# Patient Record
Sex: Male | Born: 1995 | Race: Black or African American | Hispanic: No | Marital: Single | Smoking: Current every day smoker
Health system: Southern US, Community
[De-identification: ages and names within clinical notes are randomized; demographics above are authoritative.]

## PROBLEM LIST (undated history)

## (undated) ENCOUNTER — Ambulatory Visit (HOSPITAL_COMMUNITY): Disposition: A | Discharge status: Home / Self Care | Payer: Medicaid Other

## (undated) VITALS — BP 109/72 | HR 83 | Temp 98.1°F | Resp 18 | Ht 66.34 in | Wt 138.9 lb

## (undated) VITALS — BP 112/72 | HR 97 | Temp 98.3°F | Resp 16

## (undated) DIAGNOSIS — F603 Borderline personality disorder: Secondary | ICD-10-CM

## (undated) DIAGNOSIS — F329 Major depressive disorder, single episode, unspecified: Secondary | ICD-10-CM

## (undated) DIAGNOSIS — D649 Anemia, unspecified: Secondary | ICD-10-CM

## (undated) DIAGNOSIS — R51 Headache: Secondary | ICD-10-CM

## (undated) DIAGNOSIS — F209 Schizophrenia, unspecified: Secondary | ICD-10-CM

## (undated) DIAGNOSIS — F419 Anxiety disorder, unspecified: Secondary | ICD-10-CM

## (undated) DIAGNOSIS — L309 Dermatitis, unspecified: Secondary | ICD-10-CM

## (undated) DIAGNOSIS — H9325 Central auditory processing disorder: Secondary | ICD-10-CM

## (undated) DIAGNOSIS — F32A Depression, unspecified: Secondary | ICD-10-CM

## (undated) DIAGNOSIS — F913 Oppositional defiant disorder: Secondary | ICD-10-CM

## (undated) DIAGNOSIS — F902 Attention-deficit hyperactivity disorder, combined type: Secondary | ICD-10-CM

## (undated) DIAGNOSIS — Z7289 Other problems related to lifestyle: Secondary | ICD-10-CM

## (undated) DIAGNOSIS — F319 Bipolar disorder, unspecified: Secondary | ICD-10-CM

## (undated) DIAGNOSIS — F988 Other specified behavioral and emotional disorders with onset usually occurring in childhood and adolescence: Secondary | ICD-10-CM

## (undated) HISTORY — PX: BACK SURGERY: SHX140

## (undated) HISTORY — PX: FRACTURE SURGERY: SHX138

## (undated) HISTORY — PX: NO PAST SURGERIES: SHX2092

---
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
## 2017-01-03 ENCOUNTER — Encounter (HOSPITAL_COMMUNITY): Payer: Self-pay | Admitting: Emergency Medicine

## 2017-01-03 ENCOUNTER — Emergency Department (HOSPITAL_COMMUNITY)
Admission: EM | Admit: 2017-01-03 | Discharge: 2017-01-05 | Disposition: A | Discharge status: Home / Self Care | Payer: Medicaid Other | Attending: Emergency Medicine | Admitting: Emergency Medicine

## 2017-01-03 DIAGNOSIS — F3163 Bipolar disorder, current episode mixed, severe, without psychotic features: Secondary | ICD-10-CM | POA: Diagnosis present

## 2017-01-03 DIAGNOSIS — F909 Attention-deficit hyperactivity disorder, unspecified type: Secondary | ICD-10-CM | POA: Insufficient documentation

## 2017-01-03 DIAGNOSIS — F329 Major depressive disorder, single episode, unspecified: Secondary | ICD-10-CM | POA: Diagnosis present

## 2017-01-03 DIAGNOSIS — F17210 Nicotine dependence, cigarettes, uncomplicated: Secondary | ICD-10-CM | POA: Diagnosis not present

## 2017-01-03 DIAGNOSIS — Z79899 Other long term (current) drug therapy: Secondary | ICD-10-CM | POA: Insufficient documentation

## 2017-01-03 LAB — COMPREHENSIVE METABOLIC PANEL
ALT: 16 U/L — ABNORMAL LOW (ref 17–63)
AST: 26 U/L (ref 15–41)
Albumin: 4.6 g/dL (ref 3.5–5.0)
Alkaline Phosphatase: 92 U/L (ref 38–126)
Anion gap: 11 (ref 5–15)
BUN: 10 mg/dL (ref 6–20)
CO2: 27 mmol/L (ref 22–32)
Calcium: 9.8 mg/dL (ref 8.9–10.3)
Chloride: 103 mmol/L (ref 101–111)
Creatinine, Ser: 0.82 mg/dL (ref 0.61–1.24)
GFR calc Af Amer: >60 mL/min (ref >60)
GFR calc non Af Amer: >60 mL/min (ref >60)
Glucose, Bld: 80 mg/dL (ref 65–99)
Potassium: 3.2 mmol/L — ABNORMAL LOW (ref 3.5–5.1)
Sodium: 141 mmol/L (ref 135–145)
Total Bilirubin: 0.5 mg/dL (ref 0.3–1.2)
Total Protein: 8.8 g/dL — ABNORMAL HIGH (ref 6.5–8.1)

## 2017-01-03 LAB — ACETAMINOPHEN LEVEL: Acetaminophen (Tylenol), Serum: <10 ug/mL — ABNORMAL LOW (ref 10–30)

## 2017-01-03 LAB — ETHANOL: Alcohol, Ethyl (B): <5 mg/dL (ref <5)

## 2017-01-03 LAB — CBC
HCT: 50.5 % (ref 39.0–52.0)
Hemoglobin: 17.1 g/dL — ABNORMAL HIGH (ref 13.0–17.0)
MCH: 27.4 pg (ref 26.0–34.0)
MCHC: 33.9 g/dL (ref 30.0–36.0)
MCV: 81.1 fL (ref 78.0–100.0)
Platelets: 255 10*3/uL (ref 150–400)
RBC: 6.23 MIL/uL — ABNORMAL HIGH (ref 4.22–5.81)
RDW: 13.1 % (ref 11.5–15.5)
WBC: 4.8 10*3/uL (ref 4.0–10.5)

## 2017-01-03 LAB — SALICYLATE LEVEL: Salicylate Lvl: <7 mg/dL (ref 2.8–30.0)

## 2017-01-03 MED ORDER — QUETIAPINE FUMARATE 100 MG PO TABS: 100.0000 mg | ORAL_TABLET | Freq: Every day | ORAL | Status: DC

## 2017-01-03 MED ORDER — QUETIAPINE FUMARATE 25 MG PO TABS
25.0000 mg | ORAL_TABLET | Freq: Two times a day (BID) | ORAL | Status: DC
  Administered 2017-01-03: 25 mg via ORAL
  Filled 2017-01-03: qty 1

## 2017-01-03 MED ORDER — BUPROPION HCL 75 MG PO TABS
75.0000 mg | ORAL_TABLET | Freq: Every day | ORAL | Status: DC
  Administered 2017-01-03 – 2017-01-05 (×2): 75 mg via ORAL
  Filled 2017-01-03 (×3): qty 1

## 2017-01-03 MED ORDER — POTASSIUM CHLORIDE CRYS ER 20 MEQ PO TBCR
40.0000 meq | EXTENDED_RELEASE_TABLET | Freq: Once | ORAL | Status: DC
  Filled 2017-01-03: qty 2

## 2017-01-03 MED ORDER — FLUVOXAMINE MALEATE 50 MG PO TABS: 50.0000 mg | ORAL_TABLET | Freq: Every day | ORAL | Status: DC

## 2017-01-03 MED ORDER — CARBAMAZEPINE ER 200 MG PO TB12
200.0000 mg | ORAL_TABLET | Freq: Two times a day (BID) | ORAL | Status: DC
  Administered 2017-01-03 – 2017-01-05 (×3): 200 mg via ORAL
  Filled 2017-01-03 (×3): qty 1
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
## 2017-01-03 NOTE — ED Notes (Signed)
Hourly rounding reveals patient sleeping in room. No complaints, stable, in no acute distress. Q15 minute rounds and monitoring via Security Cameras to continue. 

## 2017-01-03 NOTE — ED Triage Notes (Signed)
Patient brought in [facility] under IVC papers that were taken out by mother that state: "respondent is suicidal according to mother. Threatened to kill himself and others. Has hallucinations claiming to put spells and curses on family members. Threatened to bleed his family and himself. Possibly taking illeagal drugs. According to mother, respondent is bipolar and is prescribed medication which he is currently not taking. Is a danger to himself and others."  When trying to patient, patient not answering or responding when asked any of the triage questions.  Patient cooperative with obtaining vitals and getting changed out into scrubs.

## 2017-01-03 NOTE — ED Notes (Signed)
[facility]: [facility] Expected date:  Expected time:  Means of arrival:  Comments: 

## 2017-01-03 NOTE — ED Notes (Signed)
Court Trublood is patient's ACT team member and can be reached at [phone]

## 2017-01-03 NOTE — ED Notes (Signed)
Bed: [facility] Expected date:  Expected time:  Means of arrival:  Comments: Hold for Triage 3

## 2017-01-03 NOTE — ED Notes (Signed)
Went into room he would not respond to me. He lifted his head up but would not let me get his vitals. Rn Jillyn Hidden was made aware of the refusal

## 2017-01-03 NOTE — ED Notes (Signed)
Report to include Situation, Background, Assessment, and Recommendations received from Diane RN. Patient warm and dry, in no acute distress. Patient refuses to answer questions regarding SI, HI, AVH and pain. Patient made aware of Q15 minute rounds and security cameras for their safety. Patient instructed to come to me with needs or concerns.

## 2017-01-03 NOTE — BH Assessment (Addendum)
[facility] Assessment Progress Note  This writer attempted to ascertain whether this pt has a care coordinator through his LME.  Neither [employer] nor the [facility] are able to find the pt in their system, and [facility] registration reports that the Medicaid ID number that we have for the pt ([id number]) is not active.  I then called [facility], pt's ACT Team provider.  They report that they received a call from Marisue Ivan at the [facility] [phone]) earlier today.  She informed them that she is pt's current care coordinator.  At 15:23 I called her, leaving a message on her voice mail.  Return call is pending as of this writing.  Doylene Canning, MA Triage Specialist [phone]   Addendum:  The best phone number for [name] (pt's mother/guardian) is [phone].  Letter of guardianship is found in pt's EPIC record.  Doylene Canning, MA Triage Specialist [phone]

## 2017-01-03 NOTE — ED Notes (Signed)
Hourly rounding reveals patient sleeping in room. No complaints, stable, in no acute distress. Q15 minute rounds and monitoring via [employer] to continue. Pt. Would not respond to question about his medication.

## 2017-01-03 NOTE — ED Notes (Signed)
Introduced self to patient. Pt refused to be oriented to unit expectations. Pt has been here before. Assessed pt for:  A) Anxiety &/or agitation: On admission to the [facility] pt when to bed and put the covers over his head. He would not respond to introductions or questions concerning his admission or comfort needs.   S) Safety: Safety maintained with q-15-minute checks and hourly rounds by staff.  A) ADLs: .dbtad  P) Pick-Up (room cleanliness): Pt's room clean and free of clutter.

## 2017-01-03 NOTE — ED Notes (Signed)
Pt did not want to take some of his psych medications because he is concerned that they will make him gain weight. He has a history of eating disorder. Nanine Means, DNP, re-evaluated his medications and ordered a combination that he agreed with. Pt took his medications. His affect is flat and his mood is depressed. He said that he has not been sleeping. He has been sitting in his chair in his room. He took a shower.

## 2017-01-03 NOTE — BH Assessment (Signed)
[facility] Assessment Progress Note  Case was staffed with Lord DNP who recommended a inpatient admission as appropriate bed placement is investigated.         

## 2017-01-03 NOTE — ED Provider Notes (Signed)
[facility]-EMERGENCY DEPT Provider Note   CSN: [id number] Arrival date & time: 01/03/17  0905     History   Chief Complaint Chief Complaint  Patient presents with  . IVC    HPI [name] is a 21 y.o. male.  21 year old male with history of ADHD as well as bipolar disorder here under IVC for suicidal ideations. Patient denies being suicidal. States that he has not been compliant with his psychiatric medications. Denies responding to internal stimuli. No recent use of alcohol or illicit drugs. Denies any somatic complaints at this time. No treatment use prior to arrival      Past Medical History:  Diagnosis Date  . ADD (attention deficit disorder)   . 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 Anemia   . Anxiety   . Bipolar disorder (HCC)   . Borderline personality disorder   . Central auditory processing disorder   . Deliberate self-cutting   . Depressed   . Eczema   . Headache(784.0)   . Medical history non-contributory   . Oppositional defiant disorder   . Schizophrenia [facility])     Patient Active Problem List   Diagnosis Date Noted  . Bipolar affective disorder, mixed, severe (HCC) 09/20/2016  . Cannabis use disorder, moderate, dependence (HCC) 06/05/2016  . Eating disorder Unspecified 05/22/2016  . Borderline personality disorder 05/16/2016  . MDD (major depressive disorder), recurrent episode, moderate (HCC) 05/16/2016  . Tobacco use disorder 05/15/2016    Past Surgical History:  Procedure Laterality Date  . BACK SURGERY    . FRACTURE SURGERY    . NO PAST SURGERIES    . POSTERIOR LUMBAR FUSION N/A 02/19/2015   Procedure: LATERAL L-2 CORPECTOMY;  Surgeon: Lisbeth Renshaw, MD;  Location: [facility] OR;  Service: Neurosurgery;  Laterality: N/A;  . POSTERIOR LUMBAR FUSION 4 LEVEL  02/19/2015   Procedure: Posterior T-12 - L-4 STABILIZATION OF POSTERIOR LUMBAR;  Surgeon: Lisbeth Renshaw, MD;  Location: [facility] OR;  Service:  Neurosurgery;;  . TIBIA IM NAIL INSERTION Left 02/20/2015   Procedure: INTRAMEDULLARY (IM) NAIL LEFT TIBIAL;  Surgeon: [name], MD;  Location: [facility] OR;  Service: Orthopedics;  Laterality: Left;       Home Medications    Prior to Admission medications   Medication Sig Start Date End Date Taking? Authorizing Provider  Brexpiprazole (REXULTI) 1 MG TABS Take 1 tablet (1 mg total) by mouth daily. 09/21/16   Charm Rings, NP  cloZAPine ([name]) 50 MG tablet Take 1 tablet (50 mg total) by mouth at bedtime. 09/20/16   Charm Rings, NP  fluvoxaMINE (LUVOX) 50 MG tablet Take 1 tablet (50 mg total) by mouth at bedtime. 09/20/16   Charm Rings, NP    Family History No family history on file.  Social History Social History  Substance Use Topics  . Smoking status: Current Every Day Smoker    Packs/day: 3.00    Types: Cigarettes  . Smokeless tobacco: Never Used  . Alcohol use 1.2 oz/week    2 Shots of liquor per week     Comment:  weekends some      Allergies   Prolixin [fluphenazine] and Risperdal [risperidone]   Review of Systems Review of Systems  All other systems reviewed and are negative.    Physical Exam Updated Vital Signs BP 140/71 (BP Location: Left Arm)   Pulse 63   Temp 98.4 F (36.9 C) (Oral)   Resp 15   SpO2 100%   Physical Exam  Constitutional: He is oriented to person, place, and time. He appears well-developed and well-nourished.  Non-toxic appearance. No distress.  HENT:  Head: Normocephalic and atraumatic.  Eyes: Pupils are equal, round, and reactive to light. Conjunctivae, EOM and lids are normal.  Neck: Normal range of motion. Neck supple. No tracheal deviation present. No thyroid mass present.  Cardiovascular: Normal rate, regular rhythm and normal heart sounds.  Exam reveals no gallop.   No murmur heard. Pulmonary/Chest: Effort normal and breath sounds normal. No stridor. No respiratory distress. He has no decreased breath sounds. He  has no wheezes. He has no rhonchi. He has no rales.  Abdominal: Soft. Normal appearance and bowel sounds are normal. He exhibits no distension. There is no tenderness. There is no rebound and no CVA tenderness.  Musculoskeletal: Normal range of motion. He exhibits no edema or tenderness.  Neurological: He is alert and oriented to person, place, and time. He has normal strength. No cranial nerve deficit or sensory deficit. GCS eye subscore is 4. GCS verbal subscore is 5. GCS motor subscore is 6.  Skin: Skin is warm and dry. No abrasion and no rash noted.  Psychiatric: His affect is blunt. His speech is delayed. He is withdrawn. He expresses inappropriate judgment. He expresses no homicidal and no suicidal ideation. He expresses no suicidal plans and no homicidal plans.  Nursing note and vitals reviewed.    ED Treatments / Results  Labs (all labs ordered are listed, but only abnormal results are displayed) Labs Reviewed  COMPREHENSIVE METABOLIC PANEL  ETHANOL  SALICYLATE LEVEL  ACETAMINOPHEN LEVEL  CBC  RAPID URINE DRUG SCREEN, HOSP PERFORMED    EKG  EKG Interpretation None       Radiology No results found.  Procedures Procedures (including critical care time)  Medications Ordered in ED Medications - No data to display   Initial Impression / Assessment and Plan / ED Course  I have reviewed the triage vital signs and the nursing notes.  Pertinent labs & imaging results that were available during my care of the patient were reviewed by me and considered in my medical decision making (see chart for details).     Labs are pending but patient is medically cleared for psychiatric disposition  Final Clinical Impressions(s) / ED Diagnoses   Final diagnoses:  None    New Prescriptions New Prescriptions   No medications on file     Lorre Nick, MD 01/03/17 [phone]

## 2017-01-04 DIAGNOSIS — G4700 Insomnia, unspecified: Secondary | ICD-10-CM

## 2017-01-04 DIAGNOSIS — F17210 Nicotine dependence, cigarettes, uncomplicated: Secondary | ICD-10-CM

## 2017-01-04 DIAGNOSIS — R45850 Homicidal ideations: Secondary | ICD-10-CM

## 2017-01-04 DIAGNOSIS — R45851 Suicidal ideations: Secondary | ICD-10-CM

## 2017-01-04 DIAGNOSIS — F1290 Cannabis use, unspecified, uncomplicated: Secondary | ICD-10-CM

## 2017-01-04 DIAGNOSIS — F3163 Bipolar disorder, current episode mixed, severe, without psychotic features: Secondary | ICD-10-CM

## 2017-01-04 MED ORDER — BREXPIPRAZOLE 1 MG PO TABS
1.0000 mg | ORAL_TABLET | Freq: Every day | ORAL | Status: DC
  Administered 2017-01-04 – 2017-01-05 (×2): 1 mg via ORAL
  Filled 2017-01-04 (×2): qty 1

## 2017-01-04 MED ORDER — TRAZODONE HCL 50 MG PO TABS: 50.0000 mg | ORAL_TABLET | Freq: Every evening | ORAL | Status: DC | PRN

## 2017-01-04 NOTE — ED Notes (Signed)
Pt declined to allow mHt to obtain VS

## 2017-01-04 NOTE — ED Notes (Signed)
Hourly rounding reveals patient sleeping in room. No complaints, stable, in no acute distress. Q15 minute rounds and monitoring via Security Cameras to continue. 

## 2017-01-04 NOTE — ED Notes (Addendum)
Pt agreed to take medication.  Pt able to converse with this Clinical research associate w/o difficulty. Sandwich given.

## 2017-01-04 NOTE — ED Notes (Signed)
Pt ate approx 45% of breakast

## 2017-01-04 NOTE — ED Notes (Signed)
Up to the bathroom 

## 2017-01-04 NOTE — ED Notes (Signed)
Report to include Situation, Background, Assessment, and Recommendations received from Janie RN. Patient alert and oriented, warm and dry, in no acute distress. Patient denies SI, HI, AVH and pain. Patient made aware of Q15 minute rounds and security cameras for their safety. Patient instructed to come to me with needs or concerns.  

## 2017-01-04 NOTE — ED Notes (Signed)
Up tot he bathroom to shower and change scrubs 

## 2017-01-04 NOTE — ED Notes (Signed)
Sitting on bed, declines to talk/answer questions.

## 2017-01-04 NOTE — ED Notes (Signed)
Sitting quieltly on the bed, Pt verbally declined to take his medications.."not willingly.Marland KitchenMarland Kitchen"

## 2017-01-04 NOTE — ED Notes (Signed)
On the phone 

## 2017-01-04 NOTE — ED Notes (Signed)
He refused his vitals Faith Rogue made aware

## 2017-01-04 NOTE — ED Notes (Signed)
Pt resting quietly watching tv, has eaten 100% of supper

## 2017-01-04 NOTE — ED Notes (Signed)
Pt. Approached by [facility] but will not respond to request for vital signs.

## 2017-01-04 NOTE — ED Notes (Signed)
PT knocked over his bedside table.  When asked why, pt remained non verbal with staff.  Pt sitting quietly on the bed.

## 2017-01-04 NOTE — ED Notes (Signed)
In room drawing

## 2017-01-04 NOTE — ED Notes (Signed)
Pt did not eat lunch 

## 2017-01-05 MED ORDER — BUPROPION HCL 75 MG PO TABS: 75.0000 mg | ORAL_TABLET | Freq: Every day | ORAL | 0 refills | Status: DC

## 2017-01-05 MED ORDER — CARBAMAZEPINE ER 200 MG PO TB12: 200.0000 mg | ORAL_TABLET | Freq: Two times a day (BID) | ORAL | 0 refills | Status: DC

## 2017-01-05 NOTE — ED Notes (Signed)
Hourly rounding reveals patient sleeping in room. No complaints, stable, in no acute distress. Q15 minute rounds and monitoring via Security Cameras to continue. 

## 2017-01-05 NOTE — Progress Notes (Signed)
APS worker Toya Smothers contacted CSW and took information about patient's case. CSW informed APS worker that patient is up for discharge and that patient's legal guardian is refusing to take him back and refusing options for discharge presented by ACT Team. APS worker agreed to contact patient's legal guardian and ACT Team to clarify discharge plans. APS worker reported that patient will either discharge back home with legal guardian or to one of the options presented by ACT Team. CSW awaiting return phone call with patient's discharge plans.  Celso Sickle, LCSWA [facility]  Clinical Social Worker [phone]

## 2017-01-05 NOTE — Progress Notes (Signed)
CSW contacted patient's legal guardian and informed of the options presented by ACT Team. Patient's legal guardian reported that they can do better than that and that they need to find her son placement. Patient's legal guardian continuously talked and CSW was not able to speak. Patient's legal guardian refused ACT Team placement options and reported that it was the ACT Team and hospital's responsibility to find placement for patient. CSW informed patient's legal guardian that APS would be contacted as a next step. Patient's legal guardian stated "do what you got to do" and ended call with CSW.  CSW provided update to patient's ACT Team.   CSW contacted [facility] and initiated APS report, awaiting return call from APS worker.   Celso Sickle, LCSWA [facility]  Clinical Social Worker [phone]

## 2017-01-05 NOTE — Consult Note (Signed)
[name] Face-to-Face Psychiatry Consult   Reason for Consult:  Threats to himself and others Referring Physician:  EDP Patient Identification: [name] MRN:  [id number] Principal Diagnosis: Bipolar affective disorder, mixed, severe (HCC) Diagnosis:   Patient Active Problem List   Diagnosis Date Noted  . Bipolar affective disorder, mixed, severe (HCC) [F31.63] 09/20/2016    Priority: High  . Cannabis use disorder, moderate, dependence (HCC) [F12.20] 06/05/2016  . Eating disorder Unspecified [F50.9] 05/22/2016  . Borderline personality disorder [F60.3] 05/16/2016  . Tobacco use disorder [F17.200] 05/15/2016    Total Time spent with patient: 30 minutes  Subjective:   [name] is a 21 y.o. male patient does not warrant admission  HPI:  21 yo male who presented to the ED under IVC by his mother for alleged threats to her and himself, noncompliant with medications.  He was started on medications and stabilized.  Continues to deny suicidal/homicidal ideations, hallucinations, or withdrawal symptoms.  He has been calm and cooperative.  On assessment, he was coloring designs and watching television.  His mother notified of his discharge and was very upset as always that he was not going to [facility] despite not even meeting criteria for regular inpatient.  Stable for discharge.  Past Psychiatric History: bipolar affective disorder, substance abuse  Risk to Self: None Risk to Others: None Prior Inpatient Therapy: Prior Inpatient Therapy: Yes Prior Therapy Dates: 2018 Prior Therapy Facilty/Provider(s): [facility] Reason for Treatment: MH issues Prior Outpatient Therapy: Prior Outpatient Therapy: Yes Prior Therapy Dates: 2018 Prior Therapy Facilty/Provider(s): [facility] Reason for Treatment: MH issues Does patient have an ACCT team?: Yes Does patient have Intensive In-House Services?  : No Does patient have [facility] services? : No Does patient have P4CC services?:  No  Past Medical History:  Past Medical History:  Diagnosis Date  . ADD (attention deficit disorder)   . 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 Anemia   . Anxiety   . Bipolar disorder (HCC)   . Borderline personality disorder   . Central auditory processing disorder   . Deliberate self-cutting   . Depressed   . Eczema   . Headache(784.0)   . Medical history non-contributory   . Oppositional defiant disorder   . Schizophrenia [facility])     Past Surgical History:  Procedure Laterality Date  . BACK SURGERY    . FRACTURE SURGERY    . NO PAST SURGERIES    . POSTERIOR LUMBAR FUSION N/A 02/19/2015   Procedure: LATERAL L-2 CORPECTOMY;  Surgeon: Lisbeth Renshaw, MD;  Location: [facility] OR;  Service: Neurosurgery;  Laterality: N/A;  . POSTERIOR LUMBAR FUSION 4 LEVEL  02/19/2015   Procedure: Posterior T-12 - L-4 STABILIZATION OF POSTERIOR LUMBAR;  Surgeon: Lisbeth Renshaw, MD;  Location: [facility] OR;  Service: Neurosurgery;;  . TIBIA IM NAIL INSERTION Left 02/20/2015   Procedure: INTRAMEDULLARY (IM) NAIL LEFT TIBIAL;  Surgeon: [name], MD;  Location: [facility] OR;  Service: Orthopedics;  Laterality: Left;   Family History: No family history on file. Family Psychiatric  History: mother-mental issues Social History:  History  Alcohol Use  . 1.2 oz/week  . 2 Shots of liquor per week    Comment:  weekends some      History  Drug Use  . Types: Marijuana    Comment: every other day    Social History   Social History  . Marital status: Single    Spouse name: N/A  . Number of children: N/A  .  Years of education: N/A   Social History Main Topics  . Smoking status: Current Every Day Smoker    Packs/day: 3.00    Types: Cigarettes  . Smokeless tobacco: Never Used  . Alcohol use 1.2 oz/week    2 Shots of liquor per week     Comment:  weekends some   . Drug use: Yes    Types: Marijuana     Comment: every other day  . Sexual activity: Yes    Birth  control/ protection: None     Comment: pt reluctant to answer questions and frequently stated " I dont know"   Other Topics Concern  . None   Social History Narrative   ** Merged History Encounter **       Additional Social History:    Allergies:   Allergies  Allergen Reactions  . Prolixin [Fluphenazine] Other (See Comments)    Hallucinations  . Risperdal [Risperidone] Other (See Comments)    Unknown    Labs: No results found for this or any previous visit (from the past 48 hour(s)).  Current Facility-Administered Medications  Medication Dose Route Frequency Provider Last Rate Last Dose  . Brexpiprazole TABS 1 mg  1 mg Oral Daily Lovella Hardie, MD   1 mg at 01/04/17 1415  . buPROPion [facility]) tablet 75 mg  75 mg Oral Daily Charm Rings, NP   75 mg at 01/03/17 1539  . carbamazepine (TEGRETOL XR) 12 hr tablet 200 mg  200 mg Oral BID Charm Rings, NP   200 mg at 01/04/17 2114  . potassium chloride SA (K-DUR,KLOR-CON) CR tablet 40 mEq  40 mEq Oral Once Lorre Nick, MD      . traZODone (DESYREL) tablet 50 mg  50 mg Oral QHS PRN Thedore Mins, MD       Current Outpatient Prescriptions  Medication Sig Dispense Refill  . Brexpiprazole (REXULTI) 1 MG TABS Take 1 tablet (1 mg total) by mouth daily. (Patient not taking: Reported on 01/03/2017) 30 tablet 0  . cloZAPine ([name]) 50 MG tablet Take 1 tablet (50 mg total) by mouth at bedtime. (Patient not taking: Reported on 01/03/2017) 30 tablet 0  . fluvoxaMINE (LUVOX) 50 MG tablet Take 1 tablet (50 mg total) by mouth at bedtime. (Patient not taking: Reported on 01/03/2017) 30 tablet 0    Musculoskeletal: Strength & Muscle Tone: within normal limits Gait & Station: normal Patient leans: N/A  Psychiatric Specialty Exam: Physical Exam  Constitutional: He is oriented to person, place, and time. He appears well-developed and well-nourished.  HENT:  Head: Normocephalic.  Neck: Normal range of motion.  Respiratory:  Effort normal.  Musculoskeletal: Normal range of motion.  Neurological: He is alert and oriented to person, place, and time.  Psychiatric: He has a normal mood and affect. His speech is normal and behavior is normal. Judgment and thought content normal. Cognition and memory are normal.    Review of Systems  Psychiatric/Behavioral: Positive for substance abuse.  All other systems reviewed and are negative.   Blood pressure (!) 110/59, pulse 64, temperature 98 F (36.7 C), temperature source Oral, resp. rate 18, SpO2 100 %.There is no height or weight on file to calculate BMI.  General Appearance: Casual  Eye Contact:  Good  Speech:  Normal Rate  Volume:  Normal  Mood:  Euthymic  Affect:  Blunt  Thought Process:  Coherent and Descriptions of Associations: Intact  Orientation:  Full (Time, Place, and Person)  Thought Content:  WDL and  Logical  Suicidal Thoughts:  No  Homicidal Thoughts:  No  Memory:  Immediate;   Good Recent;   Good Remote;   Good  Judgement:  Fair  Insight:  Fair  Psychomotor Activity:  Normal  Concentration:  Concentration: Good and Attention Span: Good  Recall:  Good  Fund of Knowledge:  Good  Language:  Good  Akathisia:  No  Handed:  Right  AIMS (if indicated):     Assets:  Leisure Time Physical Health Resilience Social Support  ADL's:  Intact  Cognition:  WNL  Sleep:        Treatment Plan Summary: Daily contact with patient to assess and evaluate symptoms and progress in treatment, Medication management and Plan bipolar affective disorder, mixed, mild: -Crisis stabilization -Medication management:  Started Wellbutrin 75 mg daily for depression, Tegretol 200 mg BID for mood stabilization, Brintellix 1 mg daily for depression, and Trazodone 50 mg at bedtime PRN for sleep -Individual counseling  Disposition: No evidence of imminent risk to self or others at present.    Nanine Means, NP 01/05/2017 10:21 AM  Patient seen face-to-face for  psychiatric evaluation, chart reviewed and case discussed with the physician extender and developed treatment plan. Reviewed the information documented and agree with the treatment plan. Thedore Mins, MD

## 2017-01-05 NOTE — Progress Notes (Signed)
CSW contacted by APS worker Toya Smothers and notified that patient's ACT Team was updated and has agreed to take patient to a friend's house or to the shelter. APS worker reported that she would follow up with ACT Team staff to learn where patient was discharged to, so they can continue to follow up with patient.   CSW spoke with patient regarding discharge plans. Patient inquired about his mother picking him up, CSW informed patient that patient's mother was contacted and that she reported that she was not able to pick up patient. Patient reported that he does not have a place to go and requested that CSW notify ACT Team staff.   CSW contacted patient's ACT Team [phone]) crisis line, awaiting return call.   CSW contacted by patient's ACT Team (Court Trublood), CSW informed patient's ACT Team that patient reported that he does not a have a friend's house to go. Patient's ACT Team inquired about patient having ID for shelter. Patient reported that he does not have an ID that it's at his mother's house. ACT Team requested that patient be asked to work on getting his ID from his mother's house and that ACT Team would work on getting a shelter placement. CSW asked patient to try to get ID from mother, patient reported that he has not had any luck getting in touch with his mother. CSW awaiting return call from patient's ACT Team regarding shelter placement for patient.   [name], LCSWA [facility]  Clinical Social Worker [phone]

## 2017-01-05 NOTE — Progress Notes (Signed)
CSW contacted by ACT Team staff member Court Trublood and informed that a shelter bed was found for patient in [place], [place]. CSW informed patient about shelter bed, patient agreed to go. ACT Team member reported that he would be at hospital to get patient within the next hour. Patient and patient's RN updated about transport.   Celso Sickle, LCSWA [facility]  Clinical Social Worker [phone]

## 2017-01-05 NOTE — Progress Notes (Signed)
CSW informed by patient's RN that patient requesting to speak with CSW. CSW spoke with patient, patient reported that he has a friend to stay with now. CSW will update patient's ACT Team member when they arrive to pick up patient.   Celso Sickle, LCSWA [facility]  Clinical Social Worker [phone]

## 2017-01-05 NOTE — [facility] Suicide Risk Assessment (Signed)
Suicide Risk Assessment  Discharge Assessment   [facility] Discharge Suicide Risk Assessment   Principal Problem: Bipolar affective disorder, mixed, severe [facility]) Discharge Diagnoses:  Patient Active Problem List   Diagnosis Date Noted  . Bipolar affective disorder, mixed, severe (HCC) [F31.63] 09/20/2016    Priority: High  . Cannabis use disorder, moderate, dependence (HCC) [F12.20] 06/05/2016  . Eating disorder Unspecified [F50.9] 05/22/2016  . Borderline personality disorder [F60.3] 05/16/2016  . Tobacco use disorder [F17.200] 05/15/2016    Total Time spent with patient: 45 minutes  Musculoskeletal: Strength & Muscle Tone: within normal limits Gait & Station: normal Patient leans: N/A  Psychiatric Specialty Exam: Physical Exam  Constitutional: He is oriented to person, place, and time. He appears well-developed and well-nourished.  HENT:  Head: Normocephalic.  Neck: Normal range of motion.  Respiratory: Effort normal.  Musculoskeletal: Normal range of motion.  Neurological: He is alert and oriented to person, place, and time.  Psychiatric: He has a normal mood and affect. His speech is normal and behavior is normal. Judgment and thought content normal. Cognition and memory are normal.    Review of Systems  Psychiatric/Behavioral: Positive for substance abuse.  All other systems reviewed and are negative.   Blood pressure (!) 110/59, pulse 64, temperature 98 F (36.7 C), temperature source Oral, resp. rate 18, SpO2 100 %.There is no height or weight on file to calculate BMI.  General Appearance: Casual  Eye Contact:  Good  Speech:  Normal Rate  Volume:  Normal  Mood:  Euthymic  Affect:  Blunt  Thought Process:  Coherent and Descriptions of Associations: Intact  Orientation:  Full (Time, Place, and Person)  Thought Content:  WDL and Logical  Suicidal Thoughts:  No  Homicidal Thoughts:  No  Memory:  Immediate;   Good Recent;   Good Remote;   Good  Judgement:  Fair   Insight:  Fair  Psychomotor Activity:  Normal  Concentration:  Concentration: Good and Attention Span: Good  Recall:  Good  Fund of Knowledge:  Good  Language:  Good  Akathisia:  No  Handed:  Right  AIMS (if indicated):     Assets:  Leisure Time Physical Health Resilience Social Support  ADL's:  Intact  Cognition:  WNL  Sleep:      Mental Status Per Nursing Assessment::   On Admission:   IVC'd by his mother for suicidal/homicidal threats  Demographic Factors:  Male and Adolescent or young adult  Loss Factors: NA  Historical Factors: NA  Risk Reduction Factors:   Sense of responsibility to family, Living with another person, especially a relative and Positive therapeutic relationship  Continued Clinical Symptoms:  None   Cognitive Features That Contribute To Risk:  None    Suicide Risk:  Minimal: No identifiable suicidal ideation.  Patients presenting with no risk factors but with morbid ruminations; may be classified as minimal risk based on the severity of the depressive symptoms    Plan Of Care/Follow-up recommendations:  Activity:  as tolerated Diet:  heart healthy diet  LORD, JAMISON, NP 01/05/2017, 2:12 PM

## 2017-01-05 NOTE — Progress Notes (Signed)
CSW contacted patient's legal guardian [name] and informed her that patient was psychiatrically cleared and ready for discharge. Patient's legal guardian reported that she is not taking patient back due to safety reasons. She reported that patient's ACT Team should be contacted to assist with patient's discharge and finding patient a place to live. She reported that patient should not be informed that she is not taking him back.  CSW contacted patient's [facility] ACT Team member Court Trublood [phone]), CSW connected with crisis line and awaiting return call.   Celso Sickle, LCSWA [facility]  Clinical Social Worker [phone]
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
## 2017-01-29 ENCOUNTER — Encounter (HOSPITAL_COMMUNITY): Payer: Self-pay

## 2017-01-29 ENCOUNTER — Emergency Department (HOSPITAL_COMMUNITY)
Admission: EM | Admit: 2017-01-29 | Discharge: 2017-01-29 | Discharge status: Against Medical Advice | Payer: Medicaid Other | Attending: Emergency Medicine | Admitting: Emergency Medicine

## 2017-01-29 DIAGNOSIS — R05 Cough: Secondary | ICD-10-CM | POA: Diagnosis present

## 2017-01-29 DIAGNOSIS — Z5321 Procedure and treatment not carried out due to patient leaving prior to being seen by health care provider: Secondary | ICD-10-CM | POA: Diagnosis not present

## 2017-01-29 NOTE — ED Notes (Signed)
Pt called for room x1 no answer  

## 2017-01-29 NOTE — ED Triage Notes (Signed)
Pt states that for the past three days has been having cough, sometimes productive and body aches. No fevers.

## 2017-02-08 ENCOUNTER — Encounter (HOSPITAL_COMMUNITY): Payer: Self-pay | Admitting: Emergency Medicine

## 2017-02-08 ENCOUNTER — Emergency Department (HOSPITAL_COMMUNITY)
Admission: EM | Admit: 2017-02-08 | Discharge: 2017-02-08 | Disposition: A | Discharge status: Home / Self Care | Payer: Medicaid Other | Attending: Emergency Medicine | Admitting: Emergency Medicine

## 2017-02-08 DIAGNOSIS — M791 Myalgia: Secondary | ICD-10-CM | POA: Insufficient documentation

## 2017-02-08 DIAGNOSIS — Z5321 Procedure and treatment not carried out due to patient leaving prior to being seen by health care provider: Secondary | ICD-10-CM | POA: Diagnosis not present

## 2017-02-08 NOTE — ED Notes (Signed)
Called for room X2 with no answer 

## 2017-02-08 NOTE — ED Triage Notes (Signed)
Pt c/o pain from L hand radiating up to shoulder and down L axilla to L hip and in L knee x 30 min. Pt denies strenuous activity or trauma. Pt states he was at rest when pain began

## 2017-02-11 ENCOUNTER — Emergency Department (HOSPITAL_COMMUNITY)
Admission: EM | Admit: 2017-02-11 | Discharge: 2017-02-11 | Disposition: A | Discharge status: Home / Self Care | Payer: Medicaid Other | Attending: Emergency Medicine | Admitting: Emergency Medicine

## 2017-02-11 ENCOUNTER — Encounter (HOSPITAL_COMMUNITY): Payer: Self-pay | Admitting: Emergency Medicine

## 2017-02-11 DIAGNOSIS — M79601 Pain in right arm: Secondary | ICD-10-CM

## 2017-02-11 DIAGNOSIS — R202 Paresthesia of skin: Secondary | ICD-10-CM | POA: Diagnosis not present

## 2017-02-11 DIAGNOSIS — Z9114 Patient's other noncompliance with medication regimen: Secondary | ICD-10-CM | POA: Diagnosis not present

## 2017-02-11 DIAGNOSIS — Z79899 Other long term (current) drug therapy: Secondary | ICD-10-CM | POA: Insufficient documentation

## 2017-02-11 DIAGNOSIS — F17210 Nicotine dependence, cigarettes, uncomplicated: Secondary | ICD-10-CM | POA: Diagnosis not present

## 2017-02-11 DIAGNOSIS — Z915 Personal history of self-harm: Secondary | ICD-10-CM | POA: Insufficient documentation

## 2017-02-11 DIAGNOSIS — F902 Attention-deficit hyperactivity disorder, combined type: Secondary | ICD-10-CM | POA: Diagnosis not present

## 2017-02-11 MED ORDER — IBUPROFEN 600 MG PO TABS: 600.0000 mg | ORAL_TABLET | Freq: Four times a day (QID) | ORAL | 0 refills | Status: DC | PRN

## 2017-02-11 NOTE — ED Triage Notes (Signed)
Per EMS-states he smoked weed around 0900 am states he doesn't feel right-states right bicep hurts, pinching sensation-has superficial cuts on bother forearms-self inflicted-states he has not been taking his antipsychotic meds

## 2017-02-11 NOTE — ED Triage Notes (Signed)
Pt stated that he has a 4 day hx of tingling on l/side from shoulder to knee. Denies trauma . No new injuries noted to arms

## 2017-02-11 NOTE — Discharge Instructions (Signed)
Your neurological and musculoskeletal exam was normal today. Her pain is located over the biceps muscle. X-rays will not show muscle strain. Please take ibuprofen 600 mg every 6 hours as needed for muscle pain and inflammation.  I have given you information to follow up at [facility] wellness to establish care and discuss initiating your psychiatric medications.  Please return to the emergency department if you develop thoughts of hurting herself or others, have thoughts of cutting herself. Please also return to the emergency department if you have any new or worsening symptoms.

## 2017-02-11 NOTE — ED Provider Notes (Signed)
[facility]-EMERGENCY DEPT Provider Note   CSN: [id number] Arrival date & time: 02/11/17  1124     History   Chief Complaint Chief Complaint  Patient presents with  . Arm Pain    pt reports a  tingling sensation on l/side x 4 days, denies pain    HPI [name] is a 21 y.o. male.  HPI   [name] is a 21yo male with a history of suicidal ideation, schizophrenia, anxiety, bipolar disorder, ADHD, deliberate self cutting behavior, depression who presents to the emergency department for evaluation of right arm pain. Of note EMS states that he smoked weed around 9 AM and stated that he didn't feel right, that his right bicep hurt. Patient is a difficult historian, he says that he does not want to be here and would like to go home, answers questions very shortly.    Upon further questioning patient says that he has had a four-day history of tingling sensation on the left side of his body including the left arm and the left leg. He denies numbness, weakness, diplopia, facial asymmetry, dysphagia, headache, trouble walking. Patient states that the sensation has been constant. He does endorse marijuana use, which he states may be contributing. Denies other drug use. He also states that he has a dull, aching pain in his right bicep muscle, it is a 5/10 in severity. He denies injury to the right arm. No fevers, erythema, warmth, rash, pain elsewhere.   Patient states that he has been off his psychiatric medications for over a year now. He states that he is aware that he has been diagnosed with several psychiatric disorders, but does not want to take these medications. He denies suicidal ideation, homicidal ideation. States that he feels safe leaving here. States that he has had cutting behavior in the past, but has no intention to do this upon leaving the hospital. When asked what he would like to be done at that ER, he states "I don't care, I'm tired and wanted to go  home."  Past Medical History:  Diagnosis Date  . ADD (attention deficit disorder)   . 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 Anemia   . Anxiety   . Bipolar disorder ([facility])   . Borderline personality disorder ([facility])   . Central auditory processing disorder   . Deliberate self-cutting   . Depressed   . Eczema   . Headache(784.0)   . Medical history non-contributory   . Oppositional defiant disorder   . Schizophrenia [facility])     Patient Active Problem List   Diagnosis Date Noted  . Bipolar affective disorder, mixed, severe ([facility]) 09/20/2016  . Cannabis use disorder, moderate, dependence ([facility]) 06/05/2016  . Eating disorder Unspecified 05/22/2016  . Borderline personality disorder ([facility]) 05/16/2016  . Tobacco use disorder 05/15/2016    Past Surgical History:  Procedure Laterality Date  . BACK SURGERY    . FRACTURE SURGERY    . NO PAST SURGERIES    . POSTERIOR LUMBAR FUSION N/A 02/19/2015   Procedure: LATERAL L-2 CORPECTOMY;  Surgeon: Lisbeth Renshaw, MD;  Location: [facility];  Service: Neurosurgery;  Laterality: N/A;  . POSTERIOR LUMBAR FUSION 4 LEVEL  02/19/2015   Procedure: Posterior T-12 - L-4 STABILIZATION OF POSTERIOR LUMBAR;  Surgeon: Lisbeth Renshaw, MD;  Location: [facility] OR;  Service: Neurosurgery;;  . TIBIA IM NAIL INSERTION Left 02/20/2015   Procedure: INTRAMEDULLARY (IM) NAIL LEFT TIBIAL;  Surgeon: [name], MD;  Location: [facility];  Service: Orthopedics;  Laterality: Left;       Home Medications    Prior to Admission medications   Medication Sig Start Date End Date Taking? Authorizing Provider  Brexpiprazole (REXULTI) 1 MG TABS Take 1 tablet (1 mg total) by mouth daily. Patient not taking: Reported on 01/03/2017 09/21/16   Charm Rings, NP  buPROPion [facility]) 75 MG tablet Take 1 tablet (75 mg total) by mouth daily. 01/05/17   Charm Rings, NP  carbamazepine (TEGRETOL XR) 200 MG 12 hr tablet Take 1 tablet (200 mg total) by  mouth 2 (two) times daily. 01/05/17   Charm Rings, NP  cloZAPine ([name]) 50 MG tablet Take 1 tablet (50 mg total) by mouth at bedtime. Patient not taking: Reported on 01/03/2017 09/20/16   Charm Rings, NP  fluvoxaMINE (LUVOX) 50 MG tablet Take 1 tablet (50 mg total) by mouth at bedtime. Patient not taking: Reported on 01/03/2017 09/20/16   Charm Rings, NP  ibuprofen (ADVIL,MOTRIN) 600 MG tablet Take 1 tablet (600 mg total) by mouth every 6 (six) hours as needed. 02/11/17   Kellie Shropshire, PA-C    Family History History reviewed. No pertinent family history.  Social History Social History  Substance Use Topics  . Smoking status: Current Every Day Smoker    Packs/day: 3.00    Types: Cigarettes  . Smokeless tobacco: Never Used  . Alcohol use 1.2 oz/week    2 Shots of liquor per week     Comment:  weekends some      Allergies   Prolixin [fluphenazine] and Risperdal [risperidone]   Review of Systems Review of Systems  Constitutional: Negative for chills, fatigue and fever.  Eyes: Negative for visual disturbance.  Respiratory: Negative for shortness of breath.   Cardiovascular: Negative for chest pain.  Musculoskeletal: Positive for myalgias (right biceps muscle). Negative for arthralgias, gait problem and joint swelling.     Physical Exam Updated Vital Signs BP 112/67 (BP Location: Right Arm)   Pulse 90   Temp 98.7 F (37.1 C)   Resp 18   Wt 61.9 kg (136 lb 8 oz)   SpO2 100%   BMI 21.38 kg/m   Physical Exam  Constitutional: He is oriented to person, place, and time. He appears well-developed and well-nourished. No distress.  Patient sitting calmly in room.  HENT:  Head: Normocephalic and atraumatic.  Eyes: Pupils are equal, round, and reactive to light. Conjunctivae and EOM are normal. Right eye exhibits no discharge. Left eye exhibits no discharge.  Musculoskeletal: Normal range of motion.  Patient states that his right bicep is hurting, this is not  reproduced with palpation. No tenderness to palpation over the shoulder or elbow. Full active ROM of right shoulder, elbow, wrist.   Neurological: He is alert and oriented to person, place, and time.  Mental Status:  Alert, oriented. Speech fluent without evidence of aphasia. Able to follow 2 step commands without difficulty.  Cranial Nerves:  II:  Peripheral visual fields grossly normal, pupils equal, round, reactive to light III,IV, VI: ptosis not present, extra-ocular motions intact bilaterally  V,VII: smile symmetric, facial light touch sensation equal VIII: hearing grossly normal to voice  X: uvula elevates symmetrically  XI: bilateral shoulder shrug symmetric and strong XII: midline tongue extension without fassiculations Motor:  Normal tone. 5/5 in upper and lower extremities bilaterally including strong and equal grip strength and dorsiflexion/plantar flexion Sensory: Pinprick and light touch normal in all extremities.  Deep Tendon Reflexes: 2+  and symmetric in the biceps and patella Cerebellar: normal finger-to-nose with bilateral upper extremities Gait: normal gait and balance  Skin: Skin is warm and dry. Capillary refill takes less than 2 seconds.  Bilateral forearms with well-healed lacerations. No new lacerations or wounds.  Psychiatric:  Patient with flat affect. Speech is quiet and minimal. He denies suicidal and homicidal ideation. No apparent hallucinations or delusions.     ED Treatments / Results  Labs (all labs ordered are listed, but only abnormal results are displayed) Labs Reviewed - No data to display  EKG  EKG Interpretation None       Radiology No results found.  Procedures Procedures (including critical care time)  Medications Ordered in ED Medications - No data to display   Initial Impression / Assessment and Plan / ED Course  I have reviewed the triage vital signs and the nursing notes.  Pertinent labs & imaging results that were  available during my care of the patient were reviewed by me and considered in my medical decision making (see chart for details).    Patient presents to the emergency department complaining of left-sided tingling sensation in the arm and leg which has been ongoing for 4 days now. His neurological exam was normal. Distal sensation intact in all 4 extremities, and full strength in all 4 extremities. Do not suspect stroke or spinal cord injury. Patient also endorses marijuana use which he says sometimes makes him feel tingly, suspect that this is related to his feelings today.  Patient likely has right bicep muscle strain. No tenderness over elbow or shoulder joint, full ROM of bilateral upper extremities. Do not suspect fracture and did not proceed with imaging. Counseled patient on NSAID use to treat muscle strain.  Given patient has a history of self-inflicted cutting behavior and suicidal ideation, I questioned him extensively about his plans after he leaves the ER today. He denies suicidal ideation, homicidal ideation, self injury. States that he is going to go home and sleep. Counseled patient to follow-up with primary doctor at [facility] medical clinic for further follow-up on his psychiatric needs given that he is not on medication at this time. Patient agrees. Discussed strict return precautions including any thoughts of hurting himself or others and patient voice understanding and agrees.   Final Clinical Impressions(s) / ED Diagnoses   Final diagnoses:  Right arm pain    New Prescriptions Discharge Medication List as of 02/11/2017  1:16 PM    START taking these medications   Details  ibuprofen (ADVIL,MOTRIN) 600 MG tablet Take 1 tablet (600 mg total) by mouth every 6 (six) hours as needed., Starting Tue 02/11/2017, Print         Kellie Shropshire, PA-C 02/11/17 2348    Pricilla Loveless, MD 02/12/17 [phone]

## 2017-04-01 ENCOUNTER — Emergency Department (HOSPITAL_COMMUNITY)
Admission: EM | Admit: 2017-04-01 | Discharge: 2017-04-01 | Disposition: A | Discharge status: Other Acute Inpatient Hospital | Payer: Medicaid Other | Attending: Emergency Medicine | Admitting: Emergency Medicine

## 2017-04-01 ENCOUNTER — Encounter (HOSPITAL_COMMUNITY): Payer: Self-pay | Admitting: Emergency Medicine

## 2017-04-01 DIAGNOSIS — R440 Auditory hallucinations: Secondary | ICD-10-CM | POA: Diagnosis present

## 2017-04-01 DIAGNOSIS — F3164 Bipolar disorder, current episode mixed, severe, with psychotic features: Secondary | ICD-10-CM | POA: Insufficient documentation

## 2017-04-01 DIAGNOSIS — F3163 Bipolar disorder, current episode mixed, severe, without psychotic features: Secondary | ICD-10-CM | POA: Diagnosis present

## 2017-04-01 DIAGNOSIS — Z046 Encounter for general psychiatric examination, requested by authority: Secondary | ICD-10-CM | POA: Insufficient documentation

## 2017-04-01 DIAGNOSIS — F17210 Nicotine dependence, cigarettes, uncomplicated: Secondary | ICD-10-CM | POA: Diagnosis not present

## 2017-04-01 LAB — COMPREHENSIVE METABOLIC PANEL
ALT: 13 U/L — ABNORMAL LOW (ref 17–63)
AST: 22 U/L (ref 15–41)
Albumin: 4.7 g/dL (ref 3.5–5.0)
Alkaline Phosphatase: 106 U/L (ref 38–126)
Anion gap: 10 (ref 5–15)
BUN: 10 mg/dL (ref 6–20)
CO2: 29 mmol/L (ref 22–32)
Calcium: 9.9 mg/dL (ref 8.9–10.3)
Chloride: 101 mmol/L (ref 101–111)
Creatinine, Ser: 0.77 mg/dL (ref 0.61–1.24)
GFR calc Af Amer: >60 mL/min (ref >60)
GFR calc non Af Amer: >60 mL/min (ref >60)
Glucose, Bld: 98 mg/dL (ref 65–99)
Potassium: 4 mmol/L (ref 3.5–5.1)
Sodium: 140 mmol/L (ref 135–145)
Total Bilirubin: 1.1 mg/dL (ref 0.3–1.2)
Total Protein: 8.5 g/dL — ABNORMAL HIGH (ref 6.5–8.1)

## 2017-04-01 LAB — RAPID URINE DRUG SCREEN, HOSP PERFORMED
Amphetamines: NOT DETECTED
Barbiturates: NOT DETECTED
Benzodiazepines: NOT DETECTED
Cocaine: NOT DETECTED
Opiates: NOT DETECTED
Tetrahydrocannabinol: NOT DETECTED

## 2017-04-01 LAB — CBC
HCT: 49.6 % (ref 39.0–52.0)
Hemoglobin: 16.6 g/dL (ref 13.0–17.0)
MCH: 27.9 pg (ref 26.0–34.0)
MCHC: 33.5 g/dL (ref 30.0–36.0)
MCV: 83.5 fL (ref 78.0–100.0)
Platelets: 246 10*3/uL (ref 150–400)
RBC: 5.94 MIL/uL — ABNORMAL HIGH (ref 4.22–5.81)
RDW: 14.4 % (ref 11.5–15.5)
WBC: 4.4 10*3/uL (ref 4.0–10.5)

## 2017-04-01 LAB — ETHANOL: Alcohol, Ethyl (B): <10 mg/dL (ref <10)

## 2017-04-01 MED ORDER — ONDANSETRON HCL 4 MG PO TABS: 4.0000 mg | ORAL_TABLET | Freq: Three times a day (TID) | ORAL | Status: DC | PRN

## 2017-04-01 MED ORDER — NICOTINE 21 MG/24HR TD PT24: 21.0000 mg | MEDICATED_PATCH | Freq: Every day | TRANSDERMAL | Status: DC

## 2017-04-01 MED ORDER — BREXPIPRAZOLE 1 MG PO TABS
1.0000 mg | ORAL_TABLET | Freq: Every day | ORAL | Status: DC
  Administered 2017-04-01: 1 mg via ORAL
  Filled 2017-04-01: qty 1

## 2017-04-01 MED ORDER — ZOLPIDEM TARTRATE 5 MG PO TABS: 5.0000 mg | ORAL_TABLET | Freq: Every evening | ORAL | Status: DC | PRN

## 2017-04-01 MED ORDER — CARBAMAZEPINE ER 200 MG PO TB12
200.0000 mg | ORAL_TABLET | Freq: Two times a day (BID) | ORAL | Status: DC
  Administered 2017-04-01: 200 mg via ORAL
  Filled 2017-04-01: qty 1

## 2017-04-01 MED ORDER — FLUVOXAMINE MALEATE 50 MG PO TABS
50.0000 mg | ORAL_TABLET | Freq: Every day | ORAL | Status: DC
  Filled 2017-04-01: qty 1

## 2017-04-01 MED ORDER — ACETAMINOPHEN 325 MG PO TABS: 650.0000 mg | ORAL_TABLET | ORAL | Status: DC | PRN

## 2017-04-01 NOTE — ED Notes (Addendum)
Attempted to call [facility], informed that nurse is currently busy and will call back.

## 2017-04-01 NOTE — ED Notes (Signed)
[facility]: [facility] Expected date:  Expected time:  Means of arrival:  Comments: 

## 2017-04-01 NOTE — ED Notes (Signed)
Specimen cup provided. Pt encouraged to provide urine per MD order  

## 2017-04-01 NOTE — Progress Notes (Signed)
CSW retrieved consent forms from the [facility] provider's office.  CSW called pt's mother/guardian [name] at [phone] and [facility].  CSW faxed consent forms and received fax confirmation forms were received.  CSW called [facility] at ph: [phone] press option 1 confirmed forms were received.  [place] from [place] [facility] Number for report is: [phone] Pt's unit/room/bed number will be: TBD [facility] Accepting physician: Dr. Yehuda Buddhomas Snead  Pt can arrive ASAP on 04/01/17   CSW will update RN and EDP and family.  Dorothe PeaJonathan F. Marrietta Thunder, LCSW, LCAS, CSI Clinical Social Worker Ph: [phone]

## 2017-04-01 NOTE — BHH Counselor (Addendum)
Received a call from TarrantJanelle #[phone] with [facility]. Janelle sts that [facility]. The acceptance is contingent upon patient's medication (Brezipriprazole). Per Karolee StampsJanelle, patients guardian must agree to send this particular medication with patient to the facility. Another option would be for patient's guardian to agree for this particular medication to be taken off patient's med list.   Writer contacted [facility] and discussed the contingency of [facility]'s acceptance to [facility]. Elijah Birkom will follow up accordingly.

## 2017-04-01 NOTE — BH Assessment (Signed)
Received a call from [facility]. Sts that [facility] to the [facility]. Writer made [facility] aware that patient has already been accepted to another facility. Elta GuadeloupeLaurie Parks, NP updated with this information.

## 2017-04-01 NOTE — BH Assessment (Signed)
BHH Assessment Progress Note  At 13:25 this Clinical research associatewriter called pt's mother/guardian, [name], to inform her of pt's current disposition.  I reached her at [phone], which she reports is the best number at which to reach her.  Other numbers found in EPIC for her include [phone] and [phone], the latter of which is her mother's phone number.  I informed Ms Glynda JaegerLegrand that pt's ACT Team ([facility] [phone]) had [facility], where he has been evaluate and found to need psychiatric hospitalization.  I added that I will be seeking placement for him, but that our psychiatry team will also be treating him during his stay, and that if he is stabilized before a bed is found, he will be ready for discharge.  She asks if pt would be eligible [facility], and I inform her that given the lengthy wait list for [facility], it is unlikely that he would be in the ED long enough for such a referral to come to fruition.  I inform her that [facility] not have a [facility] available at this time, that I will be contacting other facilities around the state, and that I will keep her up to date on how placement develops.  Pt is reportedly currently living with his grandparents and his uncle.  Ms Glynda JaegerLegrand is not aware of any problems with these living arrangements at this time, so I inform her that for the time being I will not be contacting pt's [facility] care coordinator to facilitate alternatives.  If needed, the care coordinator is Marisue IvanLiz, and her number is [phone].  Ms Glynda JaegerLegrand reports that pt has continued to receive ACT Team services through [facility], but has refused medications.  She asks if we will be restarting them.  I inform her that I will discuss this with our psychiatry team, which I later did with Laveda AbbeLaurie Britton Parks, FNP, but that there are restrictions on forcing medications.  Ms Glynda JaegerLegrand reports that she understands this.  The call was then concluded.  This Clinical research associatewriter  will pursue placement, as noted above.  A copy of pt's letter of guardianship has been printed and placed in pt's chart for nursing staff, and I have brought this to the attention of pt's nurse, Morrie SheldonAshley.  Doylene Canninghomas Taunia Frasco, MA Triage Specialist [phone]

## 2017-04-01 NOTE — BH Assessment (Signed)
BHH Assessment Progress Note  Per Thedore MinsMojeed Akintayo, MD, this voluntary pt requires psychiatric hospitalization at this time.  At 16:05 [place] calls from [facility].  Pt has been accepted to their facility by Dr Yehuda Buddhomas Snead, with a caveat that pt's mother/guardian, [name], [phone]) will need to sign their consent for admission form, which they will fax to us at [phone]; the signed form will then need to be faxed back to [facility] at [phone].  Laveda AbbeLaurie Britton Parks, FNP, concurs with this disposition, as does Ms Glynda JaegerLegrand.  This Clinical research associatewriter has spoken to RadioShackJonathan Riffey, Lowell[place]CSW, [phone]) who agrees to facilitate the process.  Pt's nurse, Morrie Sheldonshley, has been notified, and agrees to call report either to [phone] (before 20:00) or to [phone] (after 20:00).  [facility] [place] [facility] [place] [facility].  Medford Staheli, MA Triage Specialist [phone]

## 2017-04-01 NOTE — Progress Notes (Signed)
CSW received a call from disposition and was left a handoff:   "Per Thedore MinsMojeed Akintayo, MD, this voluntary pt requires psychiatric hospitalization at this time.  At 16:05 [place]iametta calls from [facility].  Pt has been accepted to their facility by Dr Yehuda Buddhomas Snead, with a caveat that pt's mother/guardian, [name], [phone]) will need to sign their consent for admission form, which they will fax to us at [phone]; the signed form will then need to be faxed back to [facility] at [phone].  Laveda AbbeLaurie Britton Parks, FNP, concurs with this disposition, as does Ms Glynda JaegerLegrand.  This Clinical research associatewriter has spoken to [employer]Jonathan Rashana Andrew, [place] SpringsLCSW, [phone] who agrees to facilitate the process.  Pt's nurse, Morrie Sheldonshley, has been notified, and agrees to call report either to [phone] (before 20:00) or to [phone] (after 20:00).  [facility]."  4:53 PM CSW received a call from pt's RN that the consent for admission form has arrived via fax, CSW will retrieve from the [facility] provider's office.  CSW called pt's mother/guardian Joi Legrand at ph:  and left HIPPA-compliant VM stating form needing to be signed is with the CSW in the ED and to return the CSW's call ASAP.   CSW awaiting return call from pt's mother/guardian Joi Legrand .  CSW will continue to follow for D/C needs.  Dorothe PeaJonathan F. Theo Krumholz, LCSW, LCAS, CSI Clinical Social Worker Ph: [phone]

## 2017-04-01 NOTE — ED Notes (Signed)
Pt admitted to room #40. Flat affect. "I have issues." Pt guarded, forwards little with this nurse. Pt denies SI/HI. Pt endorsing AVH "sometimes." Encouragement and support provided. Special checks q 15 mins in place for safety, Video monitoring in place. Will continue to monitor.

## 2017-04-01 NOTE — ED Notes (Signed)
Pt has been changed out and belongings were placed in a bag and placed in triage.

## 2017-04-01 NOTE — ED Notes (Signed)
Pt transported to [facility] by [facility] transportation service for continuation of specialized care. Belongings given to driver after patient signed for them. Pt left in no acute distress. 

## 2017-04-01 NOTE — ED Provider Notes (Signed)
[facility]-EMERGENCY DEPT Provider Note   CSN: [id number] Arrival date & time: 04/01/17  [id number]     History   Chief Complaint Chief Complaint  Patient presents with  . Psychiatric Evaluation    HPI [name] is a 21 y.o. male.  HPI 21 yo male with hx of schizophrenia and bipolar disorder presenting to ER with ACT team member who was called to the house today because of concerns of safety. Pt was speaking of demons and felt like he need to be removed from the "environment". Not on any meds at this time. Hx of self harm. ACT member believes he will benefit from acute psych hospitalzation.    Past Medical History:  Diagnosis Date  . ADD (attention deficit disorder)   . 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 Anemia   . Anxiety   . Bipolar disorder (HCC)   . Borderline personality disorder (HCC)   . Central auditory processing disorder   . Deliberate self-cutting   . Depressed   . Eczema   . Headache(784.0)   . Medical history non-contributory   . Oppositional defiant disorder   . Schizophrenia [facility](HCC)     Patient Active Problem List   Diagnosis Date Noted  . Bipolar affective disorder, mixed, severe (HCC) 09/20/2016  . Cannabis use disorder, moderate, dependence (HCC) 06/05/2016  . Eating disorder Unspecified 05/22/2016  . Borderline personality disorder (HCC) 05/16/2016  . Tobacco use disorder 05/15/2016    Past Surgical History:  Procedure Laterality Date  . BACK SURGERY    . FRACTURE SURGERY    . INTRAMEDULLARY (IM) NAIL LEFT TIBIAL Left 02/20/2015   Performed by Samson FredericSwinteck, Brian, [facility]  . LATERAL L-2 CORPECTOMY N/A 02/19/2015   Performed by Lisbeth RenshawNundkumar, Neelesh, MD [facility]  . NO PAST SURGERIES    . Posterior T-12 - L-4 STABILIZATION OF POSTERIOR LUMBAR  02/19/2015   Performed by Lisbeth RenshawNundkumar, Neelesh, MD [facility]       Home Medications    Prior to Admission medications     Medication Sig Start Date End Date Taking? Authorizing Provider  Melatonin 1 MG TABS Take 10 mg at bedtime as needed by mouth (for sleep).   Yes [provider]  Brexpiprazole (REXULTI) 1 MG TABS Take 1 tablet (1 mg total) by mouth daily. Patient not taking: Reported on 01/03/2017 09/21/16   Charm RingsLord, Jamison Y, NP  buPROPion [facility](WELLBUTRIN) 75 MG tablet Take 1 tablet (75 mg total) by mouth daily. Patient not taking: Reported on 04/01/2017 01/05/17   Charm RingsLord, Jamison Y, NP  carbamazepine (TEGRETOL XR) 200 MG 12 hr tablet Take 1 tablet (200 mg total) by mouth 2 (two) times daily. Patient not taking: Reported on 04/01/2017 01/05/17   Charm RingsLord, Jamison Y, NP  cloZAPine ([name]) 50 MG tablet Take 1 tablet (50 mg total) by mouth at bedtime. Patient not taking: Reported on 01/03/2017 09/20/16   Charm RingsLord, Jamison Y, NP  fluvoxaMINE (LUVOX) 50 MG tablet Take 1 tablet (50 mg total) by mouth at bedtime. Patient not taking: Reported on 01/03/2017 09/20/16   Charm RingsLord, Jamison Y, NP  ibuprofen (ADVIL,MOTRIN) 600 MG tablet Take 1 tablet (600 mg total) by mouth every 6 (six) hours as needed. Patient not taking: Reported on 04/01/2017 02/11/17   Kellie ShropshireShrosbree, Emily J, PA-C    Family History No family history on file.  Social History Social History   Tobacco Use  . Smoking status: Current Every Day Smoker  Packs/day: 3.00    Types: Cigarettes  . Smokeless tobacco: Never Used  Substance Use Topics  . Alcohol use: Yes    Alcohol/week: 1.2 oz    Types: 2 Shots of liquor per week    Comment:  weekends some   . Drug use: Yes    Types: Marijuana    Comment: every other day     Allergies   Clozapine; Prolixin [fluphenazine]; and Risperdal [risperidone]   Review of Systems Review of Systems  All other systems reviewed and are negative.    Physical Exam Updated Vital Signs BP 137/77   Pulse 85   Temp 98.6 F (37 C) (Oral)   Resp 16   SpO2 98%   Physical Exam  Constitutional: He is oriented to person,  place, and time. He appears well-developed and well-nourished.  HENT:  Head: Normocephalic.  Eyes: EOM are normal.  Neck: Normal range of motion.  Pulmonary/Chest: Effort normal.  Abdominal: He exhibits no distension.  Musculoskeletal: Normal range of motion.  Neurological: He is alert and oriented to person, place, and time.  Psychiatric: His affect is labile. His speech is delayed and tangential. He is slowed. He expresses no suicidal plans and no homicidal plans.  Nursing note and vitals reviewed.    ED Treatments / Results  Labs (all labs ordered are listed, but only abnormal results are displayed) Labs Reviewed  CBC  COMPREHENSIVE METABOLIC PANEL  ETHANOL  RAPID URINE DRUG SCREEN, HOSP PERFORMED    EKG  EKG Interpretation None       Radiology No results found.  Procedures Procedures (including critical care time)  Medications Ordered in ED Medications  acetaminophen (TYLENOL) tablet 650 mg (not administered)  zolpidem (AMBIEN) tablet 5 mg (not administered)  ondansetron (ZOFRAN) tablet 4 mg (not administered)  nicotine (NICODERM CQ - dosed in mg/24 hours) patch 21 mg (not administered)     Initial Impression / Assessment and Plan / ED Course  I have reviewed the triage vital signs and the nursing notes.   Pertinent labs & imaging results that were available during my care of the patient were reviewed by me and considered in my medical decision making (see chart for details).    Medically clear. ACT to evaluate that patient  Final Clinical Impressions(s) / ED Diagnoses   Final diagnoses:  None    ED Discharge Orders    None       Azalia Bilisampos, Allexus Ovens, MD 04/01/17 [phone]

## 2017-04-01 NOTE — ED Notes (Signed)
Spoke with Scientist, forensicJonathon-social worker in regards to paperwork [facility], Reports will be on unit

## 2017-04-01 NOTE — ED Notes (Signed)
Pt denies SI and HI, but endorses AH that continuously calls his name. Patient denies VH. Plan of care discussed. Encouragement and support provided and safety maintain. Q 15 min safety checks in place and video monitoring.

## 2017-04-01 NOTE — Progress Notes (Signed)
CSW spoke to [place] from [facility] stating [facility] had forgotten to notate inj 6 places where pt's mother/guardian Joi legrand needed to sign/initial.    CSW noted and numbered these places 1-6 and called pt's mother/guardian who now refused to come BACK to the ED and stated she also had to work on 11/21 and could not return then.  Miss Glynda JaegerLegrand stated she would allow disposition to fax her the two papers with the 6 spaces needing to be signed/initialed on the morning of 11/21 ONCE SHE WAS PRESENT at her office fax machine and has spoken to disposition at ph: [phone].  Miss Glynda JaegerLegrand agreed to fax the signed papers (2) to [phone] and is aware she can re[facility]ch [facility] at ph: [phone] if necessary and CSW provided Miss Glynda JaegerLegrand with this information.  to be faxed back to [facility] at [phone].  CSW will leave handoff for disposition at his desk for the morning of 04/02/17 to be in contact with Miss Glynda JaegerLegrand.  Pt is transporting tonight which is allowed per [place] [facility].  Please reconsult if future social work needs arise.  CSW signing off, as social work intervention is no longer needed.  Dorothe PeaJonathan F. Joycelynn Fritsche, LCSW, LCAS, CSI Clinical Social Worker Ph: [phone]

## 2017-04-01 NOTE — ED Notes (Signed)
Bed: WBH40 Expected date:  Expected time:  Means of arrival:  Comments: Triage 3 

## 2017-04-01 NOTE — ED Notes (Signed)
Social worker at bedside.

## 2017-04-01 NOTE — Progress Notes (Signed)
04/01/17 1349:  Pt was sleep.   Ashly Yepez, LRT/CTRS 

## 2017-04-01 NOTE — ED Notes (Signed)
Pt taking a shower 

## 2017-04-01 NOTE — ED Notes (Signed)
Pt talking on hallway phone.  

## 2017-04-01 NOTE — ED Triage Notes (Signed)
Patient brought in by ACT team due to being called by patient's mother for not feeling safe at home and wanting to come to hospital. Patient denies any one harming him or thoughts of wanting to harm himself. Patient states, "these demons, they are everywhere and coming at me. They cross me wrong and I will get them."

## 2017-04-01 NOTE — BH Assessment (Signed)
Assessment Note  [name] is a 21 y.o. male, [facility] bib his [facility] team, due to not feeling "comfortable" at home. Pt denies SI and HI, but endorses AH that continuously calls his name. Pt discloses that when he is trying to do good, the voices call his name and tell him to hurt himself as well as other discouraging things. Pt reports not being on any medications now and is not sure if he needs to get back on medications. Pt is difficult to assess, as he communicates using cryptic statements, but is unable to provide much detail or insight into the statements. Pt says "I don't sleep" but then says that he sleeps @ least 3 hours a night, however, the sleep is "not very deep". Pt also asserts that he participates in "obsessive reading and religious practices". When asked what religious practices he practices, pt says, "the occult". Pt also shares "I can't control my thoughts", but is unable to indicate how long this has been occurring. Pt says many other vague statements without explanation or insight.   Diagnosis: Bipolar I, current or most recent episode depressed, with psychotic features  Past Medical History:  Past Medical History:  Diagnosis Date  . ADD (attention deficit disorder)   . 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 Anemia   . Anxiety   . Bipolar disorder (HCC)   . Borderline personality disorder (HCC)   . Central auditory processing disorder   . Deliberate self-cutting   . Depressed   . Eczema   . Headache(784.0)   . Medical history non-contributory   . Oppositional defiant disorder   . Schizophrenia [facility](HCC)     Past Surgical History:  Procedure Laterality Date  . BACK SURGERY    . FRACTURE SURGERY    . INTRAMEDULLARY (IM) NAIL LEFT TIBIAL Left 02/20/2015   Performed by Samson FredericSwinteck, Brian, [facility]  . LATERAL L-2 CORPECTOMY N/A 02/19/2015   Performed by Lisbeth RenshawNundkumar, Neelesh, [facility]  . NO PAST  SURGERIES    . Posterior T-12 - L-4 STABILIZATION OF POSTERIOR LUMBAR  02/19/2015   Performed by Lisbeth RenshawNundkumar, Neelesh, [facility]    Family History: No family history on file.  Social History:  reports that he has been smoking cigarettes.  He has been smoking about 3.00 packs per day. he has never used smokeless tobacco. He reports that he drinks about 1.2 oz of alcohol per week. He reports that he uses drugs. Drug: Marijuana.  Additional Social History:  Alcohol / Drug Use Pain Medications: Pt denies. Prescriptions: Pt denies. Over the Counter: Pt denies. History of alcohol / drug use?: Yes(Hx of THC use. Pt denies current use. UDS not completed. )  CIWA: CIWA-Ar BP: 137/77 Pulse Rate: 85 COWS:    Allergies:  Allergies  Allergen Reactions  . Clozapine Hives and Other (See Comments)    Increased blood pressure  . Prolixin [Fluphenazine] Other (See Comments)    Hallucinations  . Risperdal [Risperidone] Other (See Comments)    Unknown    Home Medications:  (Not in a hospital admission)  OB/GYN Status:  No LMP for male patient.  General Assessment Data [facility] of Assessment: [facility] Assessment: In system Is this a Tele or Face-to-Face Assessment?: Face-to-Face Is this an Initial Assessment or a Re-assessment for this encounter?: Initial Assessment Marital status: Single Living Arrangements: Other relatives(Grandparents and uncle) Can pt return to current living arrangement?: Yes Admission Status:  Voluntary Is patient capable of signing voluntary admission?: Yes Referral Source: Self/Family/Friend Insurance type: Medicaid     Crisis Care Plan Living Arrangements: Other relatives(Grandparents and uncle) Legal Guardian: Mother([employer] account executive) Name of Psychiatrist: [facility](pt reports not receiving meds)  Education Status Is patient currently in school?: No  Risk to self with the past 6 months Suicidal Ideation: No Has patient been a risk to self within the  past 6 months prior to admission? : No Suicidal Intent: No Has patient had any suicidal intent within the past 6 months prior to admission? : No Is patient at risk for suicide?: No Suicidal Plan?: No Has patient had any suicidal plan within the past 6 months prior to admission? : No Access to Means: No Intentional Self Injurious Behavior: None Family Suicide History: No Recent stressful life event(s): Other (Comment) Persecutory voices/beliefs?: No Depression: Yes Depression Symptoms: Insomnia Substance abuse history and/or treatment for substance abuse?: Yes Suicide prevention information given to non-admitted patients: Not applicable  Risk to Others within the past 6 months Homicidal Ideation: No Does patient have any lifetime risk of violence toward others beyond the six months prior to admission? : No Thoughts of Harm to Others: No Current Homicidal Intent: No Current Homicidal Plan: No Access to Homicidal Means: No History of harm to others?: No Assessment of Violence: None Noted Does patient have access to weapons?: No Criminal Charges Pending?: No Does patient have a court date: No Is patient on probation?: No  Psychosis Hallucinations: Auditory Delusions: None noted  Mental Status Report Appearance/Hygiene: Unremarkable Eye Contact: Good Motor Activity: Unremarkable Speech: Unremarkable Level of Consciousness: Quiet/awake Mood: Sad Affect: Blunted Anxiety Level: Minimal Thought Processes: Unable to Assess Judgement: Partial Orientation: Person, Place, Time, Situation Obsessive Compulsive Thoughts/Behaviors: Moderate  Cognitive Functioning Concentration: Fair Memory: Recent Impaired, Remote Impaired IQ: Average Insight: Poor Impulse Control: Unable to Assess Appetite: Fair Sleep: Decreased Total Hours of Sleep: 2 Vegetative Symptoms: Unable to Assess  ADLScreening [facility] Assessment Services) Patient's cognitive ability adequate to safely complete daily  activities?: Yes Patient able to express need for assistance with ADLs?: Yes Independently performs ADLs?: Yes (appropriate for developmental age)  Prior Inpatient Therapy Prior Inpatient Therapy: Yes Prior Therapy Dates: multiple Prior Therapy Facilty/Provider(s): multiple Reason for Treatment: bipolar  Prior Outpatient Therapy Prior Outpatient Therapy: Yes Does patient have an ACCT team?: Yes Does patient have Intensive In-House Services?  : No Does patient have [facility] services? : No Does patient have P4CC services?: No  ADL Screening (condition at time of admission) Patient's cognitive ability adequate to safely complete daily activities?: Yes Is the patient deaf or have difficulty hearing?: No Does the patient have difficulty seeing, even when wearing glasses/contacts?: No Does the patient have difficulty concentrating, remembering, or making decisions?: Yes Patient able to express need for assistance with ADLs?: Yes Does the patient have difficulty dressing or bathing?: No Independently performs ADLs?: Yes (appropriate for developmental age) Does the patient have difficulty walking or climbing stairs?: No Weakness of Legs: None Weakness of Arms/Hands: None  Home Assistive Devices/Equipment Home Assistive Devices/Equipment: None  Therapy Consults (therapy consults require a physician order) PT Evaluation Needed: No OT Evalulation Needed: No SLP Evaluation Needed: No Abuse/Neglect Assessment (Assessment to be complete while patient is alone) Abuse/Neglect Assessment Can Be Completed: Yes Physical Abuse: Denies Verbal Abuse: Denies Sexual Abuse: Denies Exploitation of patient/patient's resources: Denies Self-Neglect: Denies Values / Beliefs Cultural Requests During Hospitalization: None Spiritual Requests During Hospitalization: None   Advance Directives (For Healthcare)  Does Patient Have a Medical Advance Directive?: No Would patient like information on creating a  medical advance directive?: No - Patient declined Nutrition Screen- [facility] Patient's home diet: Regular Has the patient recently lost weight without trying?: No Has the patient been eating poorly because of a decreased appetite?: No Malnutrition Screening Tool Score: 0  Additional Information 1:1 In Past 12 Months?: No CIRT Risk: No Elopement Risk: No Does patient have medical clearance?: Yes     Disposition:  Disposition Initial Assessment Completed for this Encounter: Yes(consulted with Dr. Jannifer FranklinAkintayo) Disposition of Patient: Inpatient treatment program Type of inpatient treatment program: Adult  On Site Evaluation by:   Reviewed with Physician:    Laddie AquasSamantha M Tamasha Laplante 04/01/2017 11:10 AM

## 2017-07-12 ENCOUNTER — Encounter (HOSPITAL_COMMUNITY): Payer: Self-pay | Admitting: Emergency Medicine

## 2017-07-12 DIAGNOSIS — F603 Borderline personality disorder: Secondary | ICD-10-CM | POA: Diagnosis not present

## 2017-07-12 DIAGNOSIS — Z79899 Other long term (current) drug therapy: Secondary | ICD-10-CM | POA: Diagnosis not present

## 2017-07-12 DIAGNOSIS — F312 Bipolar disorder, current episode manic severe with psychotic features: Secondary | ICD-10-CM | POA: Insufficient documentation

## 2017-07-12 DIAGNOSIS — F17210 Nicotine dependence, cigarettes, uncomplicated: Secondary | ICD-10-CM | POA: Insufficient documentation

## 2017-07-12 DIAGNOSIS — F29 Unspecified psychosis not due to a substance or known physiological condition: Secondary | ICD-10-CM | POA: Diagnosis not present

## 2017-07-12 DIAGNOSIS — Z046 Encounter for general psychiatric examination, requested by authority: Secondary | ICD-10-CM | POA: Diagnosis present

## 2017-07-12 LAB — CBC
HCT: 43.2 % (ref 39.0–52.0)
Hemoglobin: 13.9 g/dL (ref 13.0–17.0)
MCH: 27.1 pg (ref 26.0–34.0)
MCHC: 32.2 g/dL (ref 30.0–36.0)
MCV: 84.4 fL (ref 78.0–100.0)
Platelets: 216 10*3/uL (ref 150–400)
RBC: 5.12 MIL/uL (ref 4.22–5.81)
RDW: 13.4 % (ref 11.5–15.5)
WBC: 6.7 10*3/uL (ref 4.0–10.5)

## 2017-07-12 LAB — ETHANOL: Alcohol, Ethyl (B): <10 mg/dL (ref <10)

## 2017-07-12 NOTE — ED Triage Notes (Signed)
Reports mother dropped him off here and told him to see Dr A in the morning.  Patient unsure why he is here.  When asked repeatedly reports having some history of mental illness years ago and that he has felt "different" since getting out of jail a few days ago.  When asked for more detail reports his senses have been heightened and he feels really tired.  Denies having any SI or HI.  Reports mom wants him to have a psych eval.  Noted to be calm in triage.

## 2017-07-12 NOTE — ED Notes (Signed)
Pt unable to void at this time, 

## 2017-07-13 ENCOUNTER — Encounter (HOSPITAL_COMMUNITY): Payer: Self-pay

## 2017-07-13 ENCOUNTER — Emergency Department (HOSPITAL_COMMUNITY)
Admission: EM | Admit: 2017-07-13 | Discharge: 2017-07-14 | Disposition: A | Discharge status: Other Acute Inpatient Hospital | Payer: Medicaid Other | Attending: Emergency Medicine | Admitting: Emergency Medicine

## 2017-07-13 DIAGNOSIS — F29 Unspecified psychosis not due to a substance or known physiological condition: Secondary | ICD-10-CM

## 2017-07-13 LAB — COMPREHENSIVE METABOLIC PANEL
ALT: 109 U/L — ABNORMAL HIGH (ref 17–63)
AST: 93 U/L — ABNORMAL HIGH (ref 15–41)
Albumin: 3.8 g/dL (ref 3.5–5.0)
Alkaline Phosphatase: 92 U/L (ref 38–126)
Anion gap: 13 (ref 5–15)
BUN: 5 mg/dL — ABNORMAL LOW (ref 6–20)
CO2: 26 mmol/L (ref 22–32)
Calcium: 9.2 mg/dL (ref 8.9–10.3)
Chloride: 103 mmol/L (ref 101–111)
Creatinine, Ser: 0.72 mg/dL (ref 0.61–1.24)
GFR calc Af Amer: >60 mL/min (ref >60)
GFR calc non Af Amer: >60 mL/min (ref >60)
Glucose, Bld: 86 mg/dL (ref 65–99)
Potassium: 3.6 mmol/L (ref 3.5–5.1)
Sodium: 142 mmol/L (ref 135–145)
Total Bilirubin: 0.4 mg/dL (ref 0.3–1.2)
Total Protein: 6.6 g/dL (ref 6.5–8.1)

## 2017-07-13 LAB — RAPID URINE DRUG SCREEN, HOSP PERFORMED
Amphetamines: NOT DETECTED
Barbiturates: NOT DETECTED
Benzodiazepines: NOT DETECTED
Cocaine: NOT DETECTED
Opiates: NOT DETECTED
Tetrahydrocannabinol: NOT DETECTED

## 2017-07-13 MED ORDER — BREXPIPRAZOLE 1 MG PO TABS
1.0000 mg | ORAL_TABLET | Freq: Every day | ORAL | Status: DC
  Filled 2017-07-13 (×2): qty 1

## 2017-07-13 MED ORDER — BREXPIPRAZOLE 1 MG PO TABS
1.0000 mg | ORAL_TABLET | Freq: Every day | ORAL | Status: DC
  Filled 2017-07-13: qty 1

## 2017-07-13 MED ORDER — MELATONIN 3 MG PO TABS
9.0000 mg | ORAL_TABLET | Freq: Every evening | ORAL | Status: DC | PRN
  Filled 2017-07-13: qty 3

## 2017-07-13 MED ORDER — CARBAMAZEPINE ER 200 MG PO TB12
200.0000 mg | ORAL_TABLET | Freq: Two times a day (BID) | ORAL | Status: DC
  Filled 2017-07-13 (×2): qty 1

## 2017-07-13 MED ORDER — BUPROPION HCL 75 MG PO TABS
75.0000 mg | ORAL_TABLET | Freq: Every day | ORAL | Status: DC
  Filled 2017-07-13: qty 1

## 2017-07-13 NOTE — ED Notes (Signed)
Spoke with BH in regards to patient medication amount. NP will call pharmacy to clarify.

## 2017-07-13 NOTE — Progress Notes (Signed)
Patient meets criteria for inpatient treatment. [facility]. CSW faxed referrals to the following facilities for review.  [address], [address], [facility], [address] [facility], [facility], [facility], [address] [facility], [place], [address], [place], [facility], [facility], and [facility].   TTS will continue to seek bed placement.     Moss McKy-sha Taelar Gronewold, MSW, LCSW-A, LCAS-A 07/13/2017 8:51 AM

## 2017-07-13 NOTE — ED Notes (Signed)
Pt has been quiet most of afternoon.    Sharon-secretary attempted to ask the pt what size scrubs her wore because she  Overheard him asking me for more.  He raised his voice and started asking "you ask me what size scrubs I need?"  I corrected him stating that the behavior was unnecessary, the secretary was only trying to be helpful.  He smiled at me and stated "shoulda known".

## 2017-07-13 NOTE — ED Notes (Signed)
Pt asked for some snacks.  When asked if he had received a tray, he stated no.  Empty tray was discovered by his bed and the secretary confirmed that one was delivered.  The pt was provided a coke, pb, and some saltines.

## 2017-07-13 NOTE — Progress Notes (Signed)
Called and spoke with pharmacist, Seward GraterMaggie to inform her that it is okay to restarted patient's psych home medications at the current dose.    Signed: Dandria Griego A. Beryle Lathekonkwo, NP

## 2017-07-13 NOTE — ED Notes (Signed)
Pt tak

## 2017-07-13 NOTE — ED Notes (Signed)
Patient given snack.  

## 2017-07-13 NOTE — ED Provider Notes (Signed)
TIME SEEN: 1:10 AM  CHIEF COMPLAINT: Psychiatric evaluation  HPI: Patient is a 22 year old male with history of bipolar disorder, borderline personality disorder who presents to the emergency department requesting a psychiatric evaluation.  Patient reports that he was sent here by his mother to be evaluated by psychiatry.  He states that he has been having auditory hallucinations and has been irritated recently.  He denies SI or HI he denies any medical complaints including pain, fever, cough, vomiting or diarrhea.  Denies drug use or alcohol use but then states that he was taking medications that were prescribed to him.  Has history of noncompliance with his psychiatric medications.  ROS: See HPI Constitutional: no fever  Eyes: no drainage  ENT: no runny nose   Cardiovascular:  no chest pain  Resp: no SOB  GI: no vomiting GU: no dysuria Integumentary: no rash  Allergy: no hives  Musculoskeletal: no leg swelling  Neurological: no slurred speech ROS otherwise negative  PAST MEDICAL HISTORY/PAST SURGICAL HISTORY:  Past Medical History:  Diagnosis Date  . ADD (attention deficit disorder)   . 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 Anemia   . Anxiety   . Bipolar disorder (HCC)   . Borderline personality disorder (HCC)   . Central auditory processing disorder   . Deliberate self-cutting   . Depressed   . Eczema   . Headache(784.0)   . Medical history non-contributory   . Oppositional defiant disorder   . Schizophrenia (HCC)     MEDICATIONS:  Prior to Admission medications   Medication Sig Start Date End Date Taking? Authorizing Provider  Brexpiprazole (REXULTI) 1 MG TABS Take 1 tablet (1 mg total) by mouth daily. Patient not taking: Reported on 01/03/2017 09/21/16   Charm Rings, NP  buPROPion [facility]) 75 MG tablet Take 1 tablet (75 mg total) by mouth daily. Patient not taking: Reported on 04/01/2017 01/05/17   Charm Rings, NP   carbamazepine (TEGRETOL XR) 200 MG 12 hr tablet Take 1 tablet (200 mg total) by mouth 2 (two) times daily. Patient not taking: Reported on 04/01/2017 01/05/17   Charm Rings, NP  ibuprofen (ADVIL,MOTRIN) 600 MG tablet Take 1 tablet (600 mg total) by mouth every 6 (six) hours as needed. Patient not taking: Reported on 04/01/2017 02/11/17   Kellie Shropshire, PA-C  Melatonin 1 MG TABS Take 10 mg at bedtime as needed by mouth (for sleep).    [provider]    ALLERGIES:  Allergies  Allergen Reactions  . Clozapine Hives and Other (See Comments)    Increased blood pressure  . Prolixin [Fluphenazine] Other (See Comments)    Hallucinations  . Risperdal [Risperidone] Other (See Comments)    Unknown    SOCIAL HISTORY:  Social History   Tobacco Use  . Smoking status: Current Every Day Smoker    Packs/day: 3.00    Types: Cigarettes  . Smokeless tobacco: Never Used  Substance Use Topics  . Alcohol use: Yes    Alcohol/week: 1.2 oz    Types: 2 Shots of liquor per week    Comment:  weekends some     FAMILY HISTORY: No family history on file.  EXAM: BP 129/74 (BP Location: Right Arm)   Pulse (!) 54   Temp 98.1 F (36.7 C) (Oral)   Resp 16   Ht 5\' 7"  (1.702 m)   Wt 59 kg (130 lb)   SpO2 100%   BMI 20.36 kg/m  CONSTITUTIONAL: Alert and  oriented and responds appropriately to questions. Well-appearing; well-nourished HEAD: Normocephalic EYES: Conjunctivae clear, pupils appear equal, EOMI ENT: normal nose; moist mucous membranes NECK: Supple, no meningismus, no nuchal rigidity, no LAD  CARD: RRR; S1 and S2 appreciated; no murmurs, no clicks, no rubs, no gallops RESP: Normal chest excursion without splinting or tachypnea; breath sounds clear and equal bilaterally; no wheezes, no rhonchi, no rales, no hypoxia or respiratory distress, speaking full sentences ABD/GI: Normal bowel sounds; non-distended; soft, non-tender, no rebound, no guarding, no peritoneal signs, no  hepatosplenomegaly BACK:  The back appears normal and is non-tender to palpation, there is no CVA tenderness EXT: Normal ROM in all joints; non-tender to palpation; no edema; normal capillary refill; no cyanosis, no calf tenderness or swelling    SKIN: Normal color for age and race; warm; no rash NEURO: Moves all extremities equally PSYCH: Patient has a very bizarre affect.  Patient is laughing inappropriately.  Reports hallucinations but will not tell me what he is hearing.  Denies SI or HI.  MEDICAL DECISION MAKING: Patient here for psychiatric evaluation.  Patient reports he has been irritable and having hallucinations but will not evaluate further.  I have tried to contact his mother at [phone] and unfortunately there has not been any answer and the mailbox is full.  His labs here are unremarkable other than mild elevation of his AST and ALT but no abdominal pain.  No fever.  He is medically cleared at this time.  Will consult psychiatry for their evaluation.  ED PROGRESS: 3:15 AM  [facility].  They recommend inpatient placement.  Patient here voluntarily and is cooperative.  They feel patient is delusional.  I reviewed all nursing notes, vitals, pertinent previous records, EKGs, lab and urine results, imaging (as available).      Marlet Korte, Layla MawKristen N, DO 07/13/17 [phone]

## 2017-07-13 NOTE — ED Notes (Signed)
Pt called for room multiple times no answer 

## 2017-07-13 NOTE — ED Notes (Signed)
Patient in shower. Ben sheets changed.

## 2017-07-13 NOTE — ED Notes (Signed)
Pt does not want any melatonin at this time. Meal given. Pt cooperative.

## 2017-07-13 NOTE — ED Notes (Signed)
Pharmacy called advised patient refuses to speak with RN. Patient asked multiple times does he still take the tegretol patient refuses to answer.

## 2017-07-13 NOTE — ED Notes (Signed)
Pt is requesting something to eat. He is given Malawiturkey sandwich, apple sauce, graham crackers, peanut butter, sugar free short bread cookies, sugar free lemon cookies and caff free coke.

## 2017-07-13 NOTE — ED Notes (Signed)
Patient continues to refuse to speak with RN when trying to complete assessment,

## 2017-07-13 NOTE — ED Notes (Signed)
Patient breakfast delivered

## 2017-07-13 NOTE — ED Notes (Signed)
Patient in shower 

## 2017-07-13 NOTE — ED Notes (Signed)
Regular dinner tray ordered 

## 2017-07-13 NOTE — BH Assessment (Addendum)
Tele Assessment Note   Patient Name: [name] MRN: [id number] Referring Physician: Rochele RaringKristen Ward, DO [facility] Location of Provider: Behavioral Health TTS Department  [name] is an 22 y.o. single male who presents unaccompanied and voluntarily to [facility] with tangential thought process and reporting delusions and auditory hallucinations. Pt has a long history of psychiatric treatment and reports he was recently discharged from jail after six weeks of incarceration. He reports during that time he did not receive psychiatric medication. He says tonight his mother [facility] and told him he needed a psychiatric evaluation. He states, "I don't feel like myself." Pt has tangential thought process and is a poor historian. He reports auditory hallucinations of voices and says this has made him irritable. He also states that he believes people can hear, and sometimes control, his thoughts. When asked about his mood, Pt states "I don't think about it" and "if it happens, it happens." He reports he has had very little sleep in the past three weeks and says rather than sleep, "I go into a trance." He says he feels very tired. Pt denies current suicidal ideation but says "sometimes I want to give up." Pt's medical record indicates a history of at least four suicide attempts. He denies current homicidal ideation or history of violence, however Pt's medical record indicates a history of threats to family members and charged of assault on a government official. He denies current visual hallucinations. He reports he has tried a variety of substances in the past but denies any recent use; Pt's urine drug screen is negative.  Pt cannot identify any specific stressors. He says, "I want to live a normal life" and describes that he wants to go to school to be a bartender. He says he is currently living with his mother and identifies her as his  primary support. He says he has a court date March 18 but he isn't sure what for. Pt reports he is currently receiving services from [facility]. Pt has a history of multiple psychiatric admission at various facilities and says his last hospitalization was in November 2018 [facility].  Pt is dressed in hospital scrubs, alert and oriented x4. Pt speaks in a clear tone, at moderate volume and normal pace. Motor behavior appears normal. Eye contact is good. Pt's mood is euthymic and affect is at times inconsistent with mood, with Pt smiling while referring to feeling tired. Thought process is tangential. Pt was cooperative throughout assessment.   Diagnosis: Bipolar I Disorder, Current Episode Manic With Psychotic Features  Past Medical History:  Past Medical History:  Diagnosis Date  . ADD (attention deficit disorder)   . 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 Anemia   . Anxiety   . Bipolar disorder (HCC)   . Borderline personality disorder (HCC)   . Central auditory processing disorder   . Deliberate self-cutting   . Depressed   . Eczema   . Headache(784.0)   . Medical history non-contributory   . Oppositional defiant disorder   . Schizophrenia [facility](HCC)     Past Surgical History:  Procedure Laterality Date  . BACK SURGERY    . FRACTURE SURGERY    . NO PAST SURGERIES    . POSTERIOR LUMBAR FUSION N/A 02/19/2015   Procedure: LATERAL L-2 CORPECTOMY;  Surgeon: Lisbeth RenshawNeelesh Nundkumar, MD;  Location: [facility] OR;  Service: Neurosurgery;  Laterality: N/A;  . POSTERIOR LUMBAR FUSION  4 LEVEL  02/19/2015   Procedure: Posterior T-12 - L-4 STABILIZATION OF POSTERIOR LUMBAR;  Surgeon: Lisbeth Renshaw, MD;  Location: [facility] OR;  Service: Neurosurgery;;  . TIBIA IM NAIL INSERTION Left 02/20/2015   Procedure: INTRAMEDULLARY (IM) NAIL LEFT TIBIAL;  Surgeon: [name], MD;  Location: [facility] OR;  Service: Orthopedics;  Laterality: Left;    Family History: No family  history on file.  Social History:  reports that he has been smoking cigarettes.  He has been smoking about 3.00 packs per day. he has never used smokeless tobacco. He reports that he drinks about 1.2 oz of alcohol per week. He reports that he uses drugs. Drug: Marijuana.  Additional Social History:  Alcohol / Drug Use Pain Medications: Pt denies. Prescriptions: Pt denies. Over the Counter: Pt denies. History of alcohol / drug use?: Yes(Pt reports he has tried various drugs in the past but denies recent use.) Longest period of sobriety (when/how long): unknown  CIWA: CIWA-Ar BP: 129/74 Pulse Rate: (!) 54 COWS:    Allergies:  Allergies  Allergen Reactions  . Clozapine Hives and Other (See Comments)    Increased blood pressure  . Prolixin [Fluphenazine] Other (See Comments)    Hallucinations  . Risperdal [Risperidone] Other (See Comments)    Unknown    Home Medications:  (Not in a hospital admission)  OB/GYN Status:  No LMP for male patient.  General Assessment Data Location of Assessment: [facility] ED TTS Assessment: In system Is this a Tele or Face-to-Face Assessment?: Tele Assessment Is this an Initial Assessment or a Re-assessment for this encounter?: Initial Assessment Marital status: Single Maiden name: NA Is patient pregnant?: No Pregnancy Status: No Living Arrangements: Parent(Living with mother) Can pt return to current living arrangement?: Yes Admission Status: Voluntary Is patient capable of signing voluntary admission?: Yes Referral Source: Self/Family/Friend Insurance type: Medicaid     Crisis Care Plan Living Arrangements: Parent(Living with mother) Legal Guardian: Other:(Self) Name of Psychiatrist: [facility] Name of Therapist: [facility]  Education Status Is patient currently in school?: No Current Grade: NA Highest grade of school patient has completed: 82 Name of school: NA Contact person: NA  Risk to self with the past 6 months Suicidal  Ideation: No Has patient been a risk to self within the past 6 months prior to admission? : No Suicidal Intent: No Has patient had any suicidal intent within the past 6 months prior to admission? : No Is patient at risk for suicide?: No Suicidal Plan?: No Has patient had any suicidal plan within the past 6 months prior to admission? : No Access to Means: No What has been your use of drugs/alcohol within the last 12 months?: Pt reports he has tried a variety of drugs in the past, denies recent use Previous Attempts/Gestures: Yes How many times?: 4 Other Self Harm Risks: None Triggers for Past Attempts: None known Intentional Self Injurious Behavior: Cutting Comment - Self Injurious Behavior: Pt's medical record indicates a history of cutting Family Suicide History: Unknown Recent stressful life event(s): Legal Issues Persecutory voices/beliefs?: Yes Depression: Yes Depression Symptoms: Insomnia, Isolating, Loss of interest in usual pleasures, Fatigue Substance abuse history and/or treatment for substance abuse?: No Suicide prevention information given to non-admitted patients: Not applicable  Risk to Others within the past 6 months Homicidal Ideation: No Does patient have any lifetime risk of violence toward others beyond the six months prior to admission? : Yes (comment) Thoughts of Harm to Others: No Current Homicidal Intent: No Current Homicidal Plan:  No Access to Homicidal Means: No Identified Victim: None History of harm to others?: No Assessment of Violence: In past 6-12 months Violent Behavior Description: Pt denies history of violence but medical record indicates history of threatening behaviors in the past Does patient have access to weapons?: No Criminal Charges Pending?: Yes Describe Pending Criminal Charges: Unknown Does patient have a court date: Yes Court Date: 07/28/17 Is patient on probation?: Unknown  Psychosis Hallucinations: Auditory(Pt reports hearing  voices) Delusions: Persecutory(Believes people read and control his mind)  Mental Status Report Appearance/Hygiene: In scrubs Eye Contact: Good Motor Activity: Unremarkable Speech: Tangential Level of Consciousness: Alert Mood: Euthymic Affect: Inconsistent with thought content Anxiety Level: Minimal Thought Processes: Tangential Judgement: Impaired Orientation: Person, Place, Time Obsessive Compulsive Thoughts/Behaviors: None  Cognitive Functioning Concentration: Decreased Memory: Recent Intact, Remote Intact IQ: Average Insight: Poor Impulse Control: Fair Appetite: Good Weight Loss: 0 Weight Gain: 0 Sleep: Decreased Total Hours of Sleep: 2 Vegetative Symptoms: None  ADLScreening [facility] Assessment Services) Patient's cognitive ability adequate to safely complete daily activities?: Yes Patient able to express need for assistance with ADLs?: Yes Independently performs ADLs?: Yes (appropriate for developmental age)  Prior Inpatient Therapy Prior Inpatient Therapy: Yes Prior Therapy Dates: 03/2017 Prior Therapy Facilty/Provider(s): [facility], various facilities Reason for Treatment: Bipolar disorder  Prior Outpatient Therapy Prior Outpatient Therapy: Yes Prior Therapy Dates: Current Prior Therapy Facilty/Provider(s): [facility] Reason for Treatment: Bipolar Disorder Does patient have an ACCT team?: Yes([facility]) Does patient have Intensive In-House Services?  : No Does patient have [facility] services? : No Does patient have P4CC services?: No  ADL Screening (condition at time of admission) Patient's cognitive ability adequate to safely complete daily activities?: Yes Is the patient deaf or have difficulty hearing?: No Does the patient have difficulty seeing, even when wearing glasses/contacts?: No Does the patient have difficulty concentrating, remembering, or making decisions?: No Patient able to express need for assistance with ADLs?: Yes Does the  patient have difficulty dressing or bathing?: No Independently performs ADLs?: Yes (appropriate for developmental age) Does the patient have difficulty walking or climbing stairs?: No Weakness of Legs: None Weakness of Arms/Hands: None  Home Assistive Devices/Equipment Home Assistive Devices/Equipment: None    Abuse/Neglect Assessment (Assessment to be complete while patient is alone) Abuse/Neglect Assessment Can Be Completed: Yes Physical Abuse: Denies Verbal Abuse: Denies Sexual Abuse: Denies Exploitation of patient/patient's resources: Denies Self-Neglect: Denies     Merchant navy officer (For Healthcare) Does Patient Have a Medical Advance Directive?: No Would patient like information on creating a medical advance directive?: No - Patient declined    Additional Information 1:1 In Past 12 Months?: No CIRT Risk: No Elopement Risk: No Does patient have medical clearance?: Yes     Disposition: Binnie Rail, AC at [facility], confirmed adult unit is at capacity. Gave clinical report to [name], NP who said Pt meets criteria for inpatient psychiatric treatment. TTS will contact facilities for placement. Notified Dr. Baxter Hire Ward and Marylene Land, RN of recommendation.  Disposition Initial Assessment Completed for this Encounter: Yes Disposition of Patient: Inpatient treatment program Type of inpatient treatment program: Adult  This service was provided via telemedicine using a 2-way, interactive audio and video technology.  Names of all persons participating in this telemedicine service and their role in this encounter. Name: Zachary Fila Role: Patient  Name: Shela Commons, [place] Role: TTS counselor         Harlin Rain Patsy Baltimore, [facility], [facility], [facility] Triage Specialist [phone]  Patsy Baltimore, Harlin Rain 07/13/2017 1:58 AM

## 2017-07-14 NOTE — ED Notes (Addendum)
Breakfast tray ordered 

## 2017-07-14 NOTE — ED Notes (Signed)
Pt to bed with warm blankets. Pt aware that we are here if he needs anything. Lights in room remain on.

## 2017-07-14 NOTE — ED Notes (Signed)
Second sitter arrived and will help with this pt. Lights in room turned on per pt request. Will continue to monitor pt. Sitter speaking with pt. Pt cooperative at this time.

## 2017-07-14 NOTE — ED Notes (Signed)
Pt showered

## 2017-07-14 NOTE — ED Notes (Signed)
Received call from [facility]. Pt to be admitted to [facility] after 1000 today due to discharges. Admitting md Merideth AbbeyEnrique Lopez. Nurse number [phone].

## 2017-07-14 NOTE — ED Notes (Signed)
[facility] arrived, given all d/c papers and pt belongings. Pt left with [facility] en route to [facility]

## 2017-07-14 NOTE — ED Notes (Signed)
Pt standing in doorway of his room. Mumbling "why am I here?" Pt asking repetitive questions over and over. Explained the process of what he will be going through, but pt does not seem to follow or understand. Pt has a blank stare on his face at times and clenching fists at times. Maintained distance from pt for safety. Pt keeps turning around looking in room as if something is in there. When asked if he hears or sees anything, pt just laughs and states "maybe". Repeating "why am I here?" "Who brought me here?" "Where is this place?". Reoriented pt multiple times. Pt still cooperative at this time. Security walked through pod F and is aware of the possibility of needing them due to multiple pts here and only one sitter. Charge RN also aware of pt and conversation this RN is having with him.

## 2017-08-08 ENCOUNTER — Encounter (HOSPITAL_COMMUNITY): Payer: Self-pay | Admitting: *Deleted

## 2017-08-08 ENCOUNTER — Other Ambulatory Visit: Payer: Self-pay

## 2017-08-08 ENCOUNTER — Emergency Department (HOSPITAL_COMMUNITY)
Admission: EM | Admit: 2017-08-08 | Discharge: 2017-08-10 | Disposition: A | Discharge status: Other Acute Inpatient Hospital | Payer: Medicaid Other | Attending: Emergency Medicine | Admitting: Emergency Medicine

## 2017-08-08 DIAGNOSIS — F913 Oppositional defiant disorder: Secondary | ICD-10-CM | POA: Insufficient documentation

## 2017-08-08 DIAGNOSIS — F22 Delusional disorders: Secondary | ICD-10-CM | POA: Diagnosis present

## 2017-08-08 DIAGNOSIS — F17210 Nicotine dependence, cigarettes, uncomplicated: Secondary | ICD-10-CM | POA: Diagnosis not present

## 2017-08-08 DIAGNOSIS — F319 Bipolar disorder, unspecified: Secondary | ICD-10-CM | POA: Diagnosis not present

## 2017-08-08 DIAGNOSIS — F419 Anxiety disorder, unspecified: Secondary | ICD-10-CM | POA: Insufficient documentation

## 2017-08-08 DIAGNOSIS — F1290 Cannabis use, unspecified, uncomplicated: Secondary | ICD-10-CM | POA: Diagnosis not present

## 2017-08-08 DIAGNOSIS — F902 Attention-deficit hyperactivity disorder, combined type: Secondary | ICD-10-CM | POA: Insufficient documentation

## 2017-08-08 DIAGNOSIS — F200 Paranoid schizophrenia: Secondary | ICD-10-CM | POA: Diagnosis not present

## 2017-08-08 DIAGNOSIS — F1490 Cocaine use, unspecified, uncomplicated: Secondary | ICD-10-CM | POA: Diagnosis not present

## 2017-08-08 LAB — CBC WITH DIFFERENTIAL/PLATELET
Basophils Absolute: 0 K/uL (ref 0.0–0.1)
Basophils Relative: 0 %
Eosinophils Absolute: 0.1 K/uL (ref 0.0–0.7)
Eosinophils Relative: 2 %
HCT: 40.8 % (ref 39.0–52.0)
Hemoglobin: 13.4 g/dL (ref 13.0–17.0)
Lymphocytes Relative: 34 %
Lymphs Abs: 1.6 K/uL (ref 0.7–4.0)
MCH: 27.8 pg (ref 26.0–34.0)
MCHC: 32.8 g/dL (ref 30.0–36.0)
MCV: 84.6 fL (ref 78.0–100.0)
Monocytes Absolute: 0.4 K/uL (ref 0.1–1.0)
Monocytes Relative: 10 %
Neutro Abs: 2.5 K/uL (ref 1.7–7.7)
Neutrophils Relative %: 54 %
Platelets: 275 K/uL (ref 150–400)
RBC: 4.82 MIL/uL (ref 4.22–5.81)
RDW: 14.7 % (ref 11.5–15.5)
WBC: 4.6 K/uL (ref 4.0–10.5)

## 2017-08-08 LAB — COMPREHENSIVE METABOLIC PANEL WITH GFR
ALT: 54 U/L (ref 17–63)
AST: 22 U/L (ref 15–41)
Albumin: 3.9 g/dL (ref 3.5–5.0)
Alkaline Phosphatase: 94 U/L (ref 38–126)
Anion gap: 8 (ref 5–15)
BUN: 6 mg/dL (ref 6–20)
CO2: 26 mmol/L (ref 22–32)
Calcium: 9.1 mg/dL (ref 8.9–10.3)
Chloride: 106 mmol/L (ref 101–111)
Creatinine, Ser: 0.65 mg/dL (ref 0.61–1.24)
GFR calc Af Amer: >60 mL/min
GFR calc non Af Amer: >60 mL/min
Glucose, Bld: 100 mg/dL — ABNORMAL HIGH (ref 65–99)
Potassium: 3.6 mmol/L (ref 3.5–5.1)
Sodium: 140 mmol/L (ref 135–145)
Total Bilirubin: 0.6 mg/dL (ref 0.3–1.2)
Total Protein: 7 g/dL (ref 6.5–8.1)

## 2017-08-08 LAB — RAPID URINE DRUG SCREEN, HOSP PERFORMED
Amphetamines: NOT DETECTED
Barbiturates: NOT DETECTED
Benzodiazepines: NOT DETECTED
Cocaine: NOT DETECTED
Opiates: NOT DETECTED
Tetrahydrocannabinol: NOT DETECTED

## 2017-08-08 LAB — ACETAMINOPHEN LEVEL: Acetaminophen (Tylenol), Serum: <10 ug/mL — ABNORMAL LOW (ref 10–30)

## 2017-08-08 LAB — ETHANOL: Alcohol, Ethyl (B): <10 mg/dL (ref <10)

## 2017-08-08 LAB — SALICYLATE LEVEL: Salicylate Lvl: <7 mg/dL (ref 2.8–30.0)

## 2017-08-08 MED ORDER — NICOTINE 21 MG/24HR TD PT24
21.0000 mg | MEDICATED_PATCH | Freq: Every day | TRANSDERMAL | Status: DC
  Filled 2017-08-08: qty 1

## 2017-08-08 MED ORDER — ZIPRASIDONE MESYLATE 20 MG IM SOLR
20.0000 mg | Freq: Two times a day (BID) | INTRAMUSCULAR | Status: DC | PRN
  Administered 2017-08-09: 20 mg via INTRAMUSCULAR
  Filled 2017-08-08: qty 20

## 2017-08-08 MED ORDER — HALOPERIDOL 0.5 MG PO TABS
2.0000 mg | ORAL_TABLET | Freq: Two times a day (BID) | ORAL | Status: DC
  Administered 2017-08-10: 2 mg via ORAL
  Filled 2017-08-08: qty 1
  Filled 2017-08-08 (×6): qty 4

## 2017-08-08 MED ORDER — IBUPROFEN 200 MG PO TABS: 600.0000 mg | ORAL_TABLET | Freq: Three times a day (TID) | ORAL | Status: DC | PRN

## 2017-08-08 MED ORDER — ONDANSETRON HCL 4 MG PO TABS: 4.0000 mg | ORAL_TABLET | Freq: Three times a day (TID) | ORAL | Status: DC | PRN

## 2017-08-08 MED ORDER — TRAZODONE HCL 50 MG PO TABS
50.0000 mg | ORAL_TABLET | Freq: Every evening | ORAL | Status: DC | PRN
  Filled 2017-08-08: qty 1

## 2017-08-08 MED ORDER — ALUM & MAG HYDROXIDE-SIMETH 200-200-20 MG/5ML PO SUSP: 30.0000 mL | Freq: Four times a day (QID) | ORAL | Status: DC | PRN

## 2017-08-08 NOTE — ED Notes (Signed)
Patient moved to tx room 41 and requested to be move because he did not like the beach scenery door. Patient continues to ask why did that move me. Writer explains plan of care. Patient still appears to be confuse as to way he was moved to the acute secured area. Patient appears withdrawn and paranoid. Patient denies SI/HI/AVH. Encouragement and support provided and safety maintain. Q 15 min safety checks in place and video monitoring.

## 2017-08-08 NOTE — ED Notes (Addendum)
Pt stated "I don't trust that's the right medicine.  There's drama at home.  I know why I'm here."  During assessment pt with poor eye contact, smiling inappropriately.

## 2017-08-08 NOTE — ED Triage Notes (Signed)
Pt brought in by [facility].  Per [facility], pt's mother said he cannot come back there d/t him not taking his meds, just got out of [facility], is having bizarre behavior and acting paranoid.

## 2017-08-08 NOTE — BH Assessment (Addendum)
Assessment Note  [name] is an 22 y.o. male that presents this date with active AH. Patient denies any thoughts of self harm although this writer is unsure if patient is processing the content of this writer's questions. Patient is smiling and laughing at different times inappropriately throughout the assessment. Patient is displaying some active thought blocking and is tangential. Patient seems to be responding to internal stimuli as evidenced by patient answering "yes" when this writer was not asking assessment questions. When asked who patient was responding to patient stated "the shapes of the past." Patient is a poor historian and renders limited information due to current mental state. Patient resides with his mother who is his guardian [name]). Patient states his mother has been giving him "dragon's blood" and per note review patient has been non-compliant with his current medication regimen. Patient denies any H/I or VH and shakes his head "yes" but answers "no." Due to patient's current mental status information to complete assessment was obtained from admission notes and prior history. Patient did not answer or respond to most of this writer's questions. This Clinical research associate is uncertain if patient is time/place oriented. Per record review, patient has a history of multiple admissions to area providers with last noted admission on 07/13/17 presenting with AH at that time also. Per note review, patient is currently receiving services from [facility] although this Clinical research associate is unsure when the patient last saw that provider. Patient has a history of SA use but UDS is negative for all illicit substances this date. Per notes, patient presents with a history of psychiatric illness recently admitted to [facility] for treatment. Patient was brought in by EMS after the patient's mother told him he could no longer live with her due to bizarre behavior and paranoia. EMS reports he has not been taking  his medications since discharge from [facility]. The patient will not engage in conversation with provider on admission. Patient makes inappropriate responses to questions. Case was staffed with Inez Pilgrim DNP who also evaluated patient. Sharma Covert DO to initiate IVC and recommends a inpatient admission as appropriate bed placement is investigated.      Diagnosis: F20 .9 Schizophrenia (per note review)   Past Medical History:  Past Medical History:  Diagnosis Date  . ADD (attention deficit disorder)   . 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 Anemia   . Anxiety   . Bipolar disorder (HCC)   . Borderline personality disorder (HCC)   . Central auditory processing disorder   . Deliberate self-cutting   . Depressed   . Eczema   . Headache(784.0)   . Oppositional defiant disorder   . Schizophrenia [facility])     Past Surgical History:  Procedure Laterality Date  . BACK SURGERY    . FRACTURE SURGERY    . NO PAST SURGERIES    . POSTERIOR LUMBAR FUSION N/A 02/19/2015   Procedure: LATERAL L-2 CORPECTOMY;  Surgeon: Lisbeth Renshaw, MD;  [facility];  Service: Neurosurgery;  Laterality: N/A;  . POSTERIOR LUMBAR FUSION 4 LEVEL  02/19/2015   Procedure: Posterior T-12 - L-4 STABILIZATION OF POSTERIOR LUMBAR;  Surgeon: Lisbeth Renshaw, MD;  Location: [facility] OR;  Service: Neurosurgery;;  . TIBIA IM NAIL INSERTION Left 02/20/2015   Procedure: INTRAMEDULLARY (IM) NAIL LEFT TIBIAL;  Surgeon: [name], MD;  Location: [facility] OR;  Service: Orthopedics;  Laterality: Left;    Family History: No family history on file.  Social History:  reports  that he has been smoking cigarettes.  He has been smoking about 3.00 packs per day. He has never used smokeless tobacco. He reports that he drinks about 1.2 oz of alcohol per week. He reports that he has current or past drug history. Drugs: Marijuana and Cocaine.  Additional Social History:  Alcohol / Drug Use Pain  Medications: Pt denies. Prescriptions: Pt denies. Over the Counter: Pt denies. History of alcohol / drug use?: Yes Longest period of sobriety (when/how long): unknown Negative Consequences of Use: (Denies) Withdrawal Symptoms: (Denies) Substance #1 Name of Substance 1: Cannabis 1 - Age of First Use: Unknown 1 - Amount (size/oz): Unknown 1 - Frequency: Unknown 1 - Duration: Unknown 1 - Last Use / Amount: Unknown pt denies use  CIWA: CIWA-Ar BP: (!) 111/46 Pulse Rate: 64 COWS:    Allergies:  Allergies  Allergen Reactions  . Clozapine Hives and Other (See Comments)    Increased blood pressure  . Prolixin [Fluphenazine] Other (See Comments)    Hallucinations  . Risperdal [Risperidone] Other (See Comments)    Unknown    Home Medications:  (Not in a hospital admission)  OB/GYN Status:  No LMP for male patient.  General Assessment Data [facility]: [facility] Assessment: In system Is this a Tele or Face-to-Face Assessment?: Face-to-Face Is this an Initial Assessment or a Re-assessment for this encounter?: Initial Assessment Marital status: Single Maiden name: NA Is patient pregnant?: No Pregnancy Status: No Living Arrangements: Parent Can pt return to current living arrangement?: Yes Admission Status: Voluntary Is patient capable of signing voluntary admission?: Yes Referral Source: Self/Family/Friend Insurance type: Medicaid  Medical Screening Exam [facility] Walk-in ONLY) Medical Exam completed: Yes  Crisis Care Plan Living Arrangements: Parent Legal Guardian: Mother(Joy Advertising account executiveLegrand) Name of Psychiatrist: [facility] Name of Therapist: [facility]  Education Status Is patient currently in school?: No Current Grade: (NA) Highest grade of school patient has completed: (11) Name of school: (NA) Contact person: (NA) Is the patient employed, unemployed or receiving disability?: Unemployed  Risk to self with the past 6 months Suicidal Ideation: No Has  patient been a risk to self within the past 6 months prior to admission? : No Suicidal Intent: No Has patient had any suicidal intent within the past 6 months prior to admission? : No Is patient at risk for suicide?: Yes Suicidal Plan?: No Has patient had any suicidal plan within the past 6 months prior to admission? : No Access to Means: No What has been your use of drugs/alcohol within the last 12 months?: Hx of cannabis use denies current use Previous Attempts/Gestures: Yes(Per Notes) How many times?: 4(Per notes) Other Self Harm Risks: None  Triggers for Past Attempts: Unknown Intentional Self Injurious Behavior: Cutting(Hx of per notes) Comment - Self Injurious Behavior: Per hx review Family Suicide History: No Recent stressful life event(s): Legal Issues Persecutory voices/beliefs?: No Depression: Yes Depression Symptoms: Feeling worthless/self pity Substance abuse history and/or treatment for substance abuse?: Yes Suicide prevention information given to non-admitted patients: Not applicable  Risk to Others within the past 6 months Homicidal Ideation: No Does patient have any lifetime risk of violence toward others beyond the six months prior to admission? : Yes (comment)(Hx of threats to family) Thoughts of Harm to Others: No Current Homicidal Intent: No Current Homicidal Plan: No Access to Homicidal Means: No Identified Victim: None History of harm to others?: No Assessment of Violence: In past 6-12 months(Per notes) Violent Behavior Description: Hx of threats to family per  chart review Does patient have access to weapons?: No Criminal Charges Pending?: (Unknown) Describe Pending Criminal Charges: Unknown Does patient have a court date: (Unknown) Court Date: (Unknown) Is patient on probation?: Unknown  Psychosis Hallucinations: Auditory Delusions: Persecutory  Mental Status Report Appearance/Hygiene: In scrubs Eye Contact: Fair Motor Activity: Freedom of  movement Speech: Tangential Level of Consciousness: Quiet/awake Mood: Preoccupied Affect: Inconsistent with thought content Anxiety Level: Minimal Thought Processes: Thought Blocking Judgement: Unimpaired Orientation: Unable to assess Obsessive Compulsive Thoughts/Behaviors: None  Cognitive Functioning Concentration: Decreased Memory: Unable to Assess Is patient IDD: No Is patient DD?: No Insight: Poor Impulse Control: Fair Appetite: Poor Have you had any weight changes? : (UTA) Sleep: (UTA) Total Hours of Sleep: (UTA) Vegetative Symptoms: None  ADLScreening [facility] Assessment Services) Patient's cognitive ability adequate to safely complete daily activities?: Yes Patient able to express need for assistance with ADLs?: Yes Independently performs ADLs?: Yes (appropriate for developmental age)  Prior Inpatient Therapy Prior Inpatient Therapy: Yes Prior Therapy Dates: 2018 Prior Therapy Facilty/Provider(s): [place] Reason for Treatment: MH isues  Prior Outpatient Therapy Prior Outpatient Therapy: Yes Prior Therapy Dates: Current Prior Therapy Facilty/Provider(s): [facility] Reason for Treatment: MH issues Does patient have an ACCT team?: Yes Does patient have Intensive In-House Services?  : No Does patient have [facility] services? : No Does patient have P4CC services?: No  ADL Screening (condition at time of admission) Patient's cognitive ability adequate to safely complete daily activities?: Yes Is the patient deaf or have difficulty hearing?: No Does the patient have difficulty seeing, even when wearing glasses/contacts?: No Does the patient have difficulty concentrating, remembering, or making decisions?: No Patient able to express need for assistance with ADLs?: Yes Does the patient have difficulty dressing or bathing?: No Independently performs ADLs?: Yes (appropriate for developmental age) Does the patient have difficulty walking or climbing stairs?:  No Weakness of Legs: None Weakness of Arms/Hands: None  Home Assistive Devices/Equipment Home Assistive Devices/Equipment: None  Therapy Consults (therapy consults require a physician order) PT Evaluation Needed: No OT Evalulation Needed: No SLP Evaluation Needed: No Abuse/Neglect Assessment (Assessment to be complete while patient is alone) Physical Abuse: Denies Verbal Abuse: Denies Sexual Abuse: Denies Exploitation of patient/patient's resources: Denies Self-Neglect: Denies Values / Beliefs Cultural Requests During Hospitalization: None Spiritual Requests During Hospitalization: None Consults Spiritual Care Consult Needed: No Social Work Consult Needed: No Merchant navy officer (For Healthcare) Does Patient Have a Medical Advance Directive?: No Would patient like information on creating a medical advance directive?: No - Patient declined    Additional Information 1:1 In Past 12 Months?: No CIRT Risk: No Elopement Risk: No Does patient have medical clearance?: Yes     Disposition: Case was staffed with Inez Pilgrim DNP who also evaluated patient. Sharma Covert DO to initiate IVC and recommends a inpatient admission as appropriate bed placement is investigated.  Disposition Initial Assessment Completed for this Encounter: Yes Disposition of Patient: Admit Type of inpatient treatment program: Adult Patient refused recommended treatment: Yes Mode of transportation if patient is discharged?: (Unknown) Patient referred to: (NA)  On Site Evaluation by:   Reviewed with Physician:    Alfredia Ferguson 08/08/2017 9:49 AM

## 2017-08-08 NOTE — ED Notes (Signed)
Pt awake, alert & responsive, no distress noted, calm & cooperative.  Standing at nurses station at present.  Monitoring for safety, Q 15 min checks in effect.

## 2017-08-08 NOTE — BH Assessment (Signed)
[facility] Assessment Progress Note Case was staffed with Inez PilgrimNorman DO, Lord DNP who also evaluated patient. Sharma CovertNorman DO to initiate IVC and recommends a inpatient admission as appropriate bed placement is investigated.

## 2017-08-08 NOTE — ED Provider Notes (Signed)
[facility] DE[facility] Provider Note: Zachary Dell, MD, FACEP  CSN: [id number] MRN: [id number] ARRIVAL: 08/08/17 at 0401 ROOM: NWG95/[id number]   CHIEF COMPLAINT  Paranoid  Level 5 caveat: Psychotic HISTORY OF PRESENT ILLNESS  08/08/17 5:15 AM [name] is a 22 y.o. male with a history of psychiatric illness recently admitted to [facility] for treatment.  He was brought in by EMS after the patient's mother told him he could no longer live with her due to bizarre behavior and paranoia.  EMS reports he has not been taking his medications since discharge from [facility].  The patient will not engage in conversation with me.  He makes inappropriate responses to questions.  He told his nurse he is not taking his medications because he does not trust them.   Past Medical History:  Diagnosis Date  . ADD (attention deficit disorder)   . 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 Anemia   . Anxiety   . Bipolar disorder (HCC)   . Borderline personality disorder (HCC)   . Central auditory processing disorder   . Deliberate self-cutting   . Depressed   . Eczema   . Headache(784.0)   . Oppositional defiant disorder   . Schizophrenia [facility])     Past Surgical History:  Procedure Laterality Date  . BACK SURGERY    . FRACTURE SURGERY    . NO PAST SURGERIES    . POSTERIOR LUMBAR FUSION N/A 02/19/2015   Procedure: LATERAL L-2 CORPECTOMY;  Surgeon: Lisbeth Renshaw, MD;  [facility];  Service: Neurosurgery;  Laterality: N/A;  . POSTERIOR LUMBAR FUSION 4 LEVEL  02/19/2015   Procedure: Posterior T-12 - L-4 STABILIZATION OF POSTERIOR LUMBAR;  Surgeon: Lisbeth Renshaw, MD;  [facility];  Service: Neurosurgery;;  . TIBIA IM NAIL INSERTION Left 02/20/2015   Procedure: INTRAMEDULLARY (IM) NAIL LEFT TIBIAL;  Surgeon: [name], MD;  [facility] OR;  Service: Orthopedics;  Laterality: Left;    No family history on file.  Social History    Tobacco Use  . Smoking status: Current Every Day Smoker    Packs/day: 3.00    Types: Cigarettes  . Smokeless tobacco: Never Used  Substance Use Topics  . Alcohol use: Yes    Alcohol/week: 1.2 oz    Types: 2 Shots of liquor per week    Comment:  weekends some   . Drug use: Not Currently    Types: Marijuana, Cocaine    Comment: Pt denies    Prior to Admission medications   Medication Sig Start Date End Date Taking? Authorizing Provider  Brexpiprazole (REXULTI) 1 MG TABS Take 1 tablet (1 mg total) by mouth daily. Patient not taking: Reported on 01/03/2017 09/21/16   Charm Rings, NP  buPROPion [facility]) 75 MG tablet Take 1 tablet (75 mg total) by mouth daily. Patient not taking: Reported on 04/01/2017 01/05/17   Charm Rings, NP  carbamazepine (TEGRETOL XR) 200 MG 12 hr tablet Take 1 tablet (200 mg total) by mouth 2 (two) times daily. Patient not taking: Reported on 04/01/2017 01/05/17   Charm Rings, NP    Allergies Clozapine; Prolixin [fluphenazine]; and Risperdal [risperidone]   REVIEW OF SYSTEMS     PHYSICAL EXAMINATION  Initial Vital Signs Blood pressure (!) 111/46, pulse 64, temperature 98 F (36.7 C), temperature source Oral, resp. rate 15, height 5\' 7"  (1.702 m), weight 59 kg (130 lb), SpO2 100 %.  Examination General: Well-developed, well-nourished male in no acute  distress; appearance consistent with age of record HENT: normocephalic; atraumatic Eyes: Normal appearance Neck: supple Heart: regular rate and rhythm Lungs: clear to auscultation bilaterally Abdomen: soft; nondistended; nontender; no masses or hepatosplenomegaly; bowel sounds present Extremities: No deformity; full range of motion; pulses normal Neurologic: Sleeping but readily awakened; motor function intact in all extremities and symmetric; no facial droop Skin: Warm and dry Psychiatric: Flat affect; inappropriate answers; poverty of speech   RESULTS  Summary of this visit's  results, reviewed by myself:   EKG Interpretation  Date/Time:    Ventricular Rate:    PR Interval:    QRS Duration:   QT Interval:    QTC Calculation:   R Axis:     Text Interpretation:        Laboratory Studies: Results for orders placed or performed during the hospital encounter of 08/08/17 (from the past 24 hour(s))  Comprehensive metabolic panel     Status: Abnormal   Collection Time: 08/08/17  4:57 AM  Result Value Ref Range   Sodium 140 135 - 145 mmol/L   Potassium 3.6 3.5 - 5.1 mmol/L   Chloride 106 101 - 111 mmol/L   CO2 26 22 - 32 mmol/L   Glucose, Bld 100 (H) 65 - 99 mg/dL   BUN 6 6 - 20 mg/dL   Creatinine, Ser 4.540.65 0.61 - 1.24 mg/dL   Calcium 9.1 8.9 - 09.810.3 mg/dL   Total Protein 7.0 6.5 - 8.1 g/dL   Albumin 3.9 3.5 - 5.0 g/dL   AST 22 15 - 41 U/L   ALT 54 17 - 63 U/L   Alkaline Phosphatase 94 38 - 126 U/L   Total Bilirubin 0.6 0.3 - 1.2 mg/dL   GFR calc non Af Amer >60 >60 mL/min   GFR calc Af Amer >60 >60 mL/min   Anion gap 8 5 - 15  Ethanol     Status: None   Collection Time: 08/08/17  4:57 AM  Result Value Ref Range   Alcohol, Ethyl (B) <10 <10 mg/dL  CBC with Diff     Status: None   Collection Time: 08/08/17  4:57 AM  Result Value Ref Range   WBC 4.6 4.0 - 10.5 K/uL   RBC 4.82 4.22 - 5.81 MIL/uL   Hemoglobin 13.4 13.0 - 17.0 g/dL   HCT 11.940.8 14.739.0 - 82.952.0 %   MCV 84.6 78.0 - 100.0 fL   MCH 27.8 26.0 - 34.0 pg   MCHC 32.8 30.0 - 36.0 g/dL   RDW 56.214.7 13.011.5 - 86.515.5 %   Platelets 275 150 - 400 K/uL   Neutrophils Relative % 54 %   Neutro Abs 2.5 1.7 - 7.7 K/uL   Lymphocytes Relative 34 %   Lymphs Abs 1.6 0.7 - 4.0 K/uL   Monocytes Relative 10 %   Monocytes Absolute 0.4 0.1 - 1.0 K/uL   Eosinophils Relative 2 %   Eosinophils Absolute 0.1 0.0 - 0.7 K/uL   Basophils Relative 0 %   Basophils Absolute 0.0 0.0 - 0.1 K/uL  Salicylate level     Status: None   Collection Time: 08/08/17  4:57 AM  Result Value Ref Range   Salicylate Lvl <7.0 2.8 - 30.0  mg/dL  Acetaminophen level     Status: Abnormal   Collection Time: 08/08/17  4:57 AM  Result Value Ref Range   Acetaminophen (Tylenol), Serum <10 (L) 10 - 30 ug/mL   Imaging Studies: No results found.  ED COURSE  Nursing notes and initial  vitals signs, including pulse oximetry, reviewed.  Vitals:   08/08/17 0419 08/08/17 0428  BP: (!) 111/46   Pulse: 64   Resp: 15   Temp: 98 F (36.7 C)   TempSrc: Oral   SpO2: 100%   Weight:  59 kg (130 lb)  Height:  5\' 7"  (1.702 m)    PROCEDURES    ED DIAGNOSES     ICD-10-CM   1. 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

## 2017-08-08 NOTE — ED Notes (Signed)
Patient has been noted to be pacing the unit,demanding to sign himself out of the hospital and irritable to staff.  Patient stands at the nurses station and stares intensely at staff.  When asked if he needs anything patient walks away.  Patient is often seen smiling or laughing to self but denies AVH.  Patient has maintained safety while on the unit this shift.

## 2017-08-08 NOTE — BH Assessment (Addendum)
[facility]  Per Juanetta BeetsJacqueline Norman, DO, this pt requires psychiatric hospitalization.  She also finds that pt meets criteria for IVC, which she has initiated.  [place], and at [employer]11:30 Magistrate Jenkins confirms receipt.  As of this writing, service of Findings and Custody Order is pending.  This Clinical research associatewriter will seek placement for pt.   [name], [place] Behavioral Health Coordinator [phone]    Addendum:  Finding and Custody Order has been served.  At 12:42 this writer called pt's mother/guardian, [name] [phone]), and notified her of pt's current disposition.  She reports that pt's current outpatient provider is [facility].  [name], [place] Behavioral Health Coordinator [phone]    Addendum:  The following facilities have been contacted to seek placement for this pt, with results as noted:  Beds available, information sent, decision pending:  [facility]   Declined:  [facility]   At capacity:  [facility]   [place] Richell Corker, [place] AutolivBehavioral Health Coordinator [phone]

## 2017-08-08 NOTE — BH Assessment (Signed)
Writer [facility] patient [facility] Attending Physician, Dr. Johnella MoloneyMcNew. Patient not appropriate for the unit. Writer spoke with [facility] Maisie Fus(Thomas H.).

## 2017-08-09 DIAGNOSIS — R4587 Impulsiveness: Secondary | ICD-10-CM | POA: Diagnosis not present

## 2017-08-09 DIAGNOSIS — F1490 Cocaine use, unspecified, uncomplicated: Secondary | ICD-10-CM | POA: Diagnosis not present

## 2017-08-09 DIAGNOSIS — F1290 Cannabis use, unspecified, uncomplicated: Secondary | ICD-10-CM | POA: Diagnosis not present

## 2017-08-09 DIAGNOSIS — F17210 Nicotine dependence, cigarettes, uncomplicated: Secondary | ICD-10-CM | POA: Diagnosis not present

## 2017-08-09 DIAGNOSIS — F200 Paranoid schizophrenia: Secondary | ICD-10-CM

## 2017-08-09 MED ORDER — DIPHENHYDRAMINE HCL 50 MG/ML IJ SOLN
INTRAMUSCULAR | Status: AC
  Filled 2017-08-09: qty 1

## 2017-08-09 MED ORDER — DIPHENHYDRAMINE HCL 50 MG/ML IJ SOLN
50.0000 mg | Freq: Once | INTRAMUSCULAR | Status: AC | PRN
  Administered 2017-08-09: 50 mg via INTRAMUSCULAR

## 2017-08-09 MED ORDER — LORAZEPAM 2 MG/ML IJ SOLN
2.0000 mg | Freq: Once | INTRAMUSCULAR | Status: AC
  Administered 2017-08-09: 2 mg via INTRAMUSCULAR
  Filled 2017-08-09: qty 1

## 2017-08-09 MED ORDER — STERILE WATER FOR INJECTION IJ SOLN
INTRAMUSCULAR | Status: AC
  Filled 2017-08-09: qty 10

## 2017-08-09 NOTE — ED Notes (Signed)
Patient back at nurse's station shivering.  Warm blanket offered.

## 2017-08-09 NOTE — ED Notes (Signed)
Pt pacing directly in front of nurses station, glaring and smirking at staff. Pt has been asked if he needs anything, and pt needs nothing.

## 2017-08-09 NOTE — ED Notes (Signed)
Patient continues to stand in hall unresponsive to verbal communication.  Prepared prn geodon for patient and alerted security to standby for administration of medication.  Approached patient and asked if nurse could give medication.  Patient continued to not respond after repeated attempts to ask patient's permission to give medication and to return to his room.  Security had to escort patient back to his room and medication given in left deltoid.  Patient returned to window at nurses's station and continues to stand there not saying anything.

## 2017-08-09 NOTE — ED Notes (Signed)
Patient still pacing back and forth in the hallway.

## 2017-08-09 NOTE — ED Notes (Signed)
Patient asked to use phone earlier.  Nurse explained that the phones don't open until 10:00 after the doctor makes his rounds.  Patient standing in one spot looking at nurse with a mad espression on his face.  Continues to stand with arms folded not speaking but just staring at staff through the glass.

## 2017-08-09 NOTE — ED Notes (Addendum)
Pt awake, alert & responsive, no distress noted, pt has had a uneventful night, slept throughout the night, calm & cooperative.

## 2017-08-09 NOTE — ED Notes (Signed)
Patient had urinated in the bed.  Assisted patient to change clothes and changed his linen.  Ativan given IM per order.

## 2017-08-09 NOTE — Consult Note (Addendum)
[facility] Consult   Reason for Consult:  Paranoid behavior Referring Physician:  EDP Patient Identification: [name] MRN:  [id number] Principal Diagnosis: Paranoid schizophrenia [facility]) Diagnosis:   Patient Active Problem List   Diagnosis Date Noted  . Paranoid schizophrenia ([place]) [F20.0]   . Bipolar affective disorder, mixed, severe ([place]) [F31.63] 09/20/2016  . Cannabis use disorder, moderate, dependence ([place]) [F12.20] 06/05/2016  . Eating disorder Unspecified [F50.9] 05/22/2016  . Borderline personality disorder ([place]) [F60.3] 05/16/2016  . Tobacco use disorder [F17.200] 05/15/2016    Total Time spent with patient: 45 minutes  Subjective:   [name] is a 22 y.o. male patient admitted with bizarre and paranoid behavior.  HPI:  Pt was seen and chart reviewed with treatment team and Dr Darleene Cleaver. Pt appears to be very guarded and paranoid. Pt is non-communicative and is seen standing in one place in the hallway, staring into the nurse's station. Pt was just released from an inpatient psychiatric admission at [facility]. [name] told him he could no longer live with her due to his paranoia and bizarre behavior. Pt's UDS and BAL are negative. Pt has been off his medications since his most recent discharge. Pt would benefit from an inpatient psychiatric admission for medication management and crisis stabilization.   Past Psychiatric History: As above  Risk to Self: Suicidal Ideation: No Suicidal Intent: No Is patient at risk for suicide?: Yes Suicidal Plan?: No Access to Means: No What has been your use of drugs/alcohol within the last 12 months?: Hx of cannabis use denies current use How many times?: 4(Per notes) Other Self Harm Risks: None  Triggers for Past Attempts: Unknown Intentional Self Injurious Behavior: Cutting(Hx of per notes) Comment - Self Injurious Behavior: Per hx review Risk to Others: Homicidal Ideation:  No Thoughts of Harm to Others: No Current Homicidal Intent: No Current Homicidal Plan: No Access to Homicidal Means: No Identified Victim: None History of harm to others?: No Assessment of Violence: In past 6-12 months(Per notes) Violent Behavior Description: Hx of threats to family per chart review Does patient have access to weapons?: No Criminal Charges Pending?: (Unknown) Describe Pending Criminal Charges: Unknown Does patient have a court date: (Unknown) Court Date: (Unknown) Prior Inpatient Therapy: Prior Inpatient Therapy: Yes Prior Therapy Dates: 2018 Prior Therapy Facilty/Provider(s): [facility] Reason for Treatment: MH isues Prior Outpatient Therapy: Prior Outpatient Therapy: Yes Prior Therapy Dates: Current Prior Therapy Facilty/Provider(s): [facility] Reason for Treatment: MH issues Does patient have an ACCT team?: Yes Does patient have Intensive In-House Services?  : No Does patient have [facility] services? : No Does patient have P4CC services?: No  Past Medical History:  Past Medical History:  Diagnosis Date  . ADD (attention deficit disorder)   . 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 Anemia   . Anxiety   . Bipolar disorder ([place])   . Borderline personality disorder ([place])   . Central auditory processing disorder   . Deliberate self-cutting   . Depressed   . Eczema   . Headache(784.0)   . Oppositional defiant disorder   . Schizophrenia [facility])     Past Surgical History:  Procedure Laterality Date  . BACK SURGERY    . FRACTURE SURGERY    . NO PAST SURGERIES    . POSTERIOR LUMBAR FUSION N/A 02/19/2015   Procedure: LATERAL L-2 CORPECTOMY;  Surgeon: [name], MD;  Location: [facility];  Service: Neurosurgery;  Laterality: N/A;  . POSTERIOR  LUMBAR FUSION 4 LEVEL  02/19/2015   Procedure: Posterior T-12 - L-4 STABILIZATION OF POSTERIOR LUMBAR;  Surgeon: [name], MD;  Location: [facility];  Service:  Neurosurgery;;  . TIBIA IM NAIL INSERTION Left 02/20/2015   Procedure: INTRAMEDULLARY (IM) NAIL LEFT TIBIAL;  Surgeon: Rod Can, MD;  Location: [place] [place];  Service: Orthopedics;  Laterality: Left;   Family History: No family history on file. Family Psychiatric  History: Unknown Social History:  Social History   Substance and Sexual Activity  Alcohol Use Yes  . Alcohol/week: 1.2 oz  . Types: 2 Shots of liquor per week   Comment:  weekends some      Social History   Substance and Sexual Activity  Drug Use Not Currently  . Types: Marijuana, Cocaine   Comment: Pt denies    Social History   Socioeconomic History  . Marital status: Single    Spouse name: Not on file  . Number of children: Not on file  . Years of education: Not on file  . Highest education level: Not on file  Occupational History  . Not on file  Social Needs  . Financial resource strain: Not on file  . Food insecurity:    Worry: Not on file    Inability: Not on file  . Transportation needs:    Medical: Not on file    Non-medical: Not on file  Tobacco Use  . Smoking status: Current Every Day Smoker    Packs/day: 3.00    Types: Cigarettes  . Smokeless tobacco: Never Used  Substance and Sexual Activity  . Alcohol use: Yes    Alcohol/week: 1.2 oz    Types: 2 Shots of liquor per week    Comment:  weekends some   . Drug use: Not Currently    Types: Marijuana, Cocaine    Comment: Pt denies  . Sexual activity: Yes    Birth control/protection: None    Comment: pt reluctant to answer questions and frequently stated " I dont know"  Lifestyle  . Physical activity:    Days per week: Not on file    Minutes per session: Not on file  . Stress: Not on file  Relationships  . Social connections:    Talks on phone: Not on file    Gets together: Not on file    Attends religious service: Not on file    Active member of club or organization: Not on file    Attends meetings of clubs or organizations: Not on file     Relationship status: Not on file  Other Topics Concern  . Not on file  Social History Narrative   ** Merged History Encounter **       Additional Social History:    Allergies:   Allergies  Allergen Reactions  . Clozapine Hives and Other (See Comments)    Increased blood pressure  . Prolixin [Fluphenazine] Other (See Comments)    Hallucinations  . Risperdal [Risperidone] Other (See Comments)    Unknown    Labs:  Results for orders placed or performed during the hospital encounter of 08/08/17 (from the past 48 hour(s))  Comprehensive metabolic panel     Status: Abnormal   Collection Time: 08/08/17  4:57 AM  Result Value Ref Range   Sodium 140 135 - 145 mmol/L   Potassium 3.6 3.5 - 5.1 mmol/L   Chloride 106 101 - 111 mmol/L   CO2 26 22 - 32 mmol/L   Glucose, Bld 100 (H) 65 -  99 mg/dL   BUN 6 6 - 20 mg/dL   Creatinine, Ser 0.65 0.61 - 1.24 mg/dL   Calcium 9.1 8.9 - 10.3 mg/dL   Total Protein 7.0 6.5 - 8.1 g/dL   Albumin 3.9 3.5 - 5.0 g/dL   AST 22 15 - 41 U/L   ALT 54 17 - 63 U/L   Alkaline Phosphatase 94 38 - 126 U/L   Total Bilirubin 0.6 0.3 - 1.2 mg/dL   GFR calc non Af Amer >60 >60 mL/min   GFR calc Af Amer >60 >60 mL/min    Comment: (NOTE) The eGFR has been calculated using the CKD EPI equation. This calculation has not been validated in all clinical situations. eGFR's persistently <60 mL/min signify possible Chronic Kidney Disease.    Anion gap 8 5 - 15    Comment: Performed at [facility], [place] [address], [place], [place] [address]  Ethanol     Status: None   Collection Time: 08/08/17  4:57 AM  Result Value Ref Range   Alcohol, Ethyl (B) <10 <10 mg/dL    Comment:        LOWEST DETECTABLE LIMIT FOR SERUM ALCOHOL IS 10 mg/dL FOR MEDICAL PURPOSES ONLY Performed at [facility] [address] [place], [place] [address]   CBC with Diff     Status: None   Collection Time: 08/08/17  4:57 AM  Result Value Ref Range    WBC 4.6 4.0 - 10.5 K/uL   RBC 4.82 4.22 - 5.81 MIL/uL   Hemoglobin 13.4 13.0 - 17.0 g/dL   HCT 40.8 39.0 - 52.0 %   MCV 84.6 78.0 - 100.0 fL   MCH 27.8 26.0 - 34.0 pg   MCHC 32.8 30.0 - 36.0 g/dL   RDW 14.7 11.5 - 15.5 %   Platelets 275 150 - 400 K/uL   Neutrophils Relative % 54 %   Neutro Abs 2.5 1.7 - 7.7 K/uL   Lymphocytes Relative 34 %   Lymphs Abs 1.6 0.7 - 4.0 K/uL   Monocytes Relative 10 %   Monocytes Absolute 0.4 0.1 - 1.0 K/uL   Eosinophils Relative 2 %   Eosinophils Absolute 0.1 0.0 - 0.7 K/uL   Basophils Relative 0 %   Basophils Absolute 0.0 0.0 - 0.1 K/uL    Comment: Performed at [facility], [place] [address] [place], [place] [address]  Salicylate level     Status: None   Collection Time: 08/08/17  4:57 AM  Result Value Ref Range   Salicylate Lvl <9.9 2.8 - 30.0 mg/dL    Comment: Performed at [facility], [place] [address], [place], [place] [address]  Acetaminophen level     Status: Abnormal   Collection Time: 08/08/17  4:57 AM  Result Value Ref Range   Acetaminophen (Tylenol), Serum <10 (L) 10 - 30 ug/mL    Comment:        THERAPEUTIC CONCENTRATIONS VARY SIGNIFICANTLY. A RANGE OF 10-30 ug/mL MAY BE AN EFFECTIVE CONCENTRATION FOR MANY PATIENTS. HOWEVER, SOME ARE BEST TREATED AT CONCENTRATIONS OUTSIDE THIS RANGE. ACETAMINOPHEN CONCENTRATIONS >150 ug/mL AT 4 HOURS AFTER INGESTION AND >50 ug/mL AT 12 HOURS AFTER INGESTION ARE OFTEN ASSOCIATED WITH TOXIC REACTIONS. Performed at [facility], [place] [address] [place], [place] [address]   Urine rapid drug screen (hosp performed)     Status: None   Collection Time: 08/08/17  1:30 PM  Result Value Ref Range   Opiates NONE DETECTED  NONE DETECTED   Cocaine NONE DETECTED NONE DETECTED   Benzodiazepines NONE DETECTED NONE DETECTED   Amphetamines NONE DETECTED NONE DETECTED   Tetrahydrocannabinol NONE DETECTED NONE DETECTED   Barbiturates NONE DETECTED NONE  DETECTED    Comment: (NOTE) DRUG SCREEN FOR MEDICAL PURPOSES ONLY.  IF CONFIRMATION IS NEEDED FOR ANY PURPOSE, NOTIFY LAB WITHIN 5 DAYS. LOWEST DETECTABLE LIMITS FOR URINE DRUG SCREEN Drug Class                     Cutoff (ng/mL) Amphetamine and metabolites    1000 Barbiturate and metabolites    200 Benzodiazepine                 536 Tricyclics and metabolites     300 Opiates and metabolites        300 Cocaine and metabolites        300 THC                            50 Performed at [facility], [place] [address] [place], [place] [address]     Current Facility-Administered Medications  Medication Dose Route Frequency Provider Last Rate Last Dose  . alum & mag hydroxide-simeth (MAALOX/MYLANTA) 200-200-20 MG/5ML suspension 30 mL  30 mL Oral Q6H PRN Molpus, John, MD      . haloperidol (HALDOL) tablet 2 mg  2 mg Oral BID Patrecia Pour, NP      . ibuprofen (ADVIL,MOTRIN) tablet 600 mg  600 mg Oral Q8H PRN Molpus, John, MD      . nicotine (NICODERM CQ - dosed in mg/24 hours) patch 21 mg  21 mg Transdermal Daily Molpus, John, MD      . ondansetron (ZOFRAN) tablet 4 mg  4 mg Oral Q8H PRN Molpus, John, MD      . traZODone (DESYREL) tablet 50 mg  50 mg Oral QHS PRN,MR X 1 Berry, Jason A, NP      . ziprasidone (GEODON) injection 20 mg  20 mg Intramuscular BID PRN Faythe Dingwall, DO       Current Outpatient Medications  Medication Sig Dispense Refill  . ARIPiprazole (ABILIFY) 15 MG tablet Take 15 mg by mouth at bedtime.    . topiramate (TOPAMAX) 25 MG tablet Take 25 mg by mouth 2 (two) times daily.      Musculoskeletal: Strength & Muscle Tone: within normal limits Gait & Station: normal Patient leans: N/A  Psychiatric Specialty Exam: Physical Exam  Constitutional: He is oriented to person, place, and time. He appears well-developed and well-nourished.  HENT:  Head: Normocephalic.  Respiratory: Effort normal.  Musculoskeletal: Normal range of motion.   Neurological: He is alert and oriented to person, place, and time.  Psychiatric: He has a normal mood and affect. His speech is normal and behavior is normal. Thought content normal. Cognition and memory are normal. He expresses impulsivity.    Review of Systems  Psychiatric/Behavioral: Positive for depression and hallucinations. Negative for memory loss, substance abuse and suicidal ideas. The patient is not nervous/anxious and does not have insomnia.   All other systems reviewed and are negative.   Blood pressure 130/72, pulse 67, temperature 99 F (37.2 C), temperature source Oral, resp. rate 18, height 5' 7" (1.702 m), weight 59 kg (130 lb), SpO2 97 %.Body mass index is 20.36 kg/m.  General Appearance: Casual  Eye Contact:  Fair  Speech:  non communicative  Volume:  non communicative  Mood:  guarded and paranoid  Affect:  Congruent  Thought Process:  Disorganized  Orientation:  Other:  UTA non communicative  Thought Content:  UTA non-communicative  Suicidal Thoughts:  UTA non-communicative  Homicidal Thoughts:  UTA non-communicative  Memory:  UTA non-communicative  Judgement:  Other:  UTA non-communicative  Insight:  UTA non-communicative  Psychomotor Activity:  Decreased  Concentration:  Concentration: Poor and Attention Span: UTA non-communicative  Recall:  UTA non-communicative  Fund of Knowledge:  UTA non-communicative  Language:  UTA non-communicative  Akathisia:  No  Handed:  Right  AIMS (if indicated):     Assets:  Financial Resources/Insurance  ADL's:  Intact  Cognition:  WNL  Sleep:        Treatment Plan Summary: Daily contact with patient to assess and evaluate symptoms and progress in treatment and Medication management (see MAR)  Disposition: Recommend psychiatric Inpatient admission when medically cleared. TTS to seek placement  Ethelene Hal, NP 08/09/2017 1:14 PM  Patient seen face-to-face for psychiatric evaluation, chart reviewed and case  discussed with the physician extender and developed treatment plan. Reviewed the information documented and agree with the treatment plan. Corena Pilgrim, MD

## 2017-08-09 NOTE — ED Notes (Signed)
Pt sedated, sleeping at present. No distress noted, calm at present.  Monitoring for safety, Q 15 min checks in effect. 

## 2017-08-09 NOTE — Progress Notes (Signed)
CSW [facility]ed [facility] out to [facility], [facility], [place] and [place], per psychiatrist's direction.  Please reconsult if future social work needs arise.  CSW signing off, as social work intervention is no longer needed.  Dorothe PeaJonathan F. Skylen Danielsen, LCSW, LCAS, CSI Clinical Social Worker Ph: [phone]

## 2017-08-09 NOTE — ED Notes (Signed)
Called mother of patient back to let her know that her son is now sleeping with no problems breathing and no allergic reactions.

## 2017-08-09 NOTE — ED Notes (Addendum)
Nurse received a call from the patient's mother saying he had telephoned her to say he felt like he couldn't swallow.  NP notified and she ordered 50mg  benadryl IM for a possible reaction.  Nurse reviewed patient's allergy list with Mother who is also his guardian and Mother reports patient is not allergic to clozapine and she would like for him to be on that medication although she says patient does not like it.

## 2017-08-09 NOTE — ED Notes (Signed)
Patient talking on phone then walked back to his room.

## 2017-08-09 NOTE — ED Notes (Signed)
Patient pacing back and forth in front of nurse's station.  When asked if patient would take his medication, patient would not respond to nurse and continued walking back and forth.

## 2017-08-10 NOTE — ED Provider Notes (Signed)
Being evaluated at this time for stability of transfer.  Currently he is standing in the hallway, staring at his nurse, through protective glass, fairly immobile.  He appears either very angry or that he is responding to internal stimuli.  Based on progress while here in the emergency department I suspect the latter.  Patient has been involuntarily committed.  While here he has received IM medicines, only.  He has not taken oral medicines which had previously been prescribed.  Vital signs this morning are normal.  Medical decision making-patient stable for transfer, by law enforcement, to a psychiatric facility.   Mancel BaleWentz, [place], MD 08/10/17 [phone]

## 2017-08-10 NOTE — ED Notes (Signed)
Pt was transferred per MD order to [facility]. Pt was in no acute distress prior to transfer. Belongings and paperwork given to sheriff.

## 2017-08-10 NOTE — ED Notes (Addendum)
[facility]IFF[facility] TRANSPORTATION TO [facility] ARRIVING AT 10AM REQUESTED.

## 2017-08-10 NOTE — ED Notes (Signed)
Pt intermittently standing at the nurses station. Staring at staff members.  Pt took shower, no distress noted.  Pending [facility] transfer today.

## 2017-08-10 NOTE — ED Notes (Signed)
Report called to Novella Oliveaniel Lewis RN. Sheriff en route.

## 2017-08-10 NOTE — BH Assessment (Signed)
[facility] Assessment Progress Note   Clinician was informed by Joanie CoddingtonLatricia, RN that patient was indeed accepted to [facility].  She called to confirm.  Patient has been [facility] to [facility] by Dr. Regino SchultzeWang.  Nurse call report to [phone].  [facility] will be [facility] location at [address] location.

## 2017-08-11 NOTE — BH Assessment (Signed)
[facility] Assessment Progress Note  After reviewing pt's chart, this writer called pt's mother, [name], to notify her that pt had been [facility] yesterday morning.  Call was placed at 08:05.  She reports that she was made aware of this when she called [facility] yesterday.  Doylene Canninghomas Goldie [place], [place] Behavioral Health Coordinator [phone]

## 2017-09-11 ENCOUNTER — Other Ambulatory Visit: Payer: Self-pay

## 2017-09-11 ENCOUNTER — Emergency Department (HOSPITAL_COMMUNITY)
Admission: EM | Admit: 2017-09-11 | Discharge: 2017-09-12 | Disposition: A | Discharge status: Other Acute Inpatient Hospital | Payer: Medicaid Other | Attending: Emergency Medicine | Admitting: Emergency Medicine

## 2017-09-11 ENCOUNTER — Encounter (HOSPITAL_COMMUNITY): Payer: Self-pay | Admitting: *Deleted

## 2017-09-11 DIAGNOSIS — F209 Schizophrenia, unspecified: Secondary | ICD-10-CM | POA: Insufficient documentation

## 2017-09-11 DIAGNOSIS — F17210 Nicotine dependence, cigarettes, uncomplicated: Secondary | ICD-10-CM | POA: Insufficient documentation

## 2017-09-11 DIAGNOSIS — Z79899 Other long term (current) drug therapy: Secondary | ICD-10-CM | POA: Insufficient documentation

## 2017-09-11 DIAGNOSIS — R451 Restlessness and agitation: Secondary | ICD-10-CM | POA: Diagnosis not present

## 2017-09-11 DIAGNOSIS — F200 Paranoid schizophrenia: Secondary | ICD-10-CM | POA: Diagnosis not present

## 2017-09-11 DIAGNOSIS — R4587 Impulsiveness: Secondary | ICD-10-CM | POA: Diagnosis not present

## 2017-09-11 LAB — CBC WITH DIFFERENTIAL/PLATELET
Basophils Absolute: 0 10*3/uL (ref 0.0–0.1)
Basophils Relative: 0 %
Eosinophils Absolute: 0 10*3/uL (ref 0.0–0.7)
Eosinophils Relative: 1 %
HCT: 40.7 % (ref 39.0–52.0)
Hemoglobin: 13.4 g/dL (ref 13.0–17.0)
Lymphocytes Relative: 33 %
Lymphs Abs: 1.8 10*3/uL (ref 0.7–4.0)
MCH: 28.2 pg (ref 26.0–34.0)
MCHC: 32.9 g/dL (ref 30.0–36.0)
MCV: 85.5 fL (ref 78.0–100.0)
Monocytes Absolute: 0.4 10*3/uL (ref 0.1–1.0)
Monocytes Relative: 8 %
Neutro Abs: 3.2 10*3/uL (ref 1.7–7.7)
Neutrophils Relative %: 58 %
Platelets: 258 10*3/uL (ref 150–400)
RBC: 4.76 MIL/uL (ref 4.22–5.81)
RDW: 13.7 % (ref 11.5–15.5)
WBC: 5.4 10*3/uL (ref 4.0–10.5)

## 2017-09-11 LAB — COMPREHENSIVE METABOLIC PANEL
ALT: 31 U/L (ref 17–63)
AST: 30 U/L (ref 15–41)
Albumin: 4.4 g/dL (ref 3.5–5.0)
Alkaline Phosphatase: 76 U/L (ref 38–126)
Anion gap: 10 (ref 5–15)
BUN: 15 mg/dL (ref 6–20)
CO2: 24 mmol/L (ref 22–32)
Calcium: 9.9 mg/dL (ref 8.9–10.3)
Chloride: 112 mmol/L — ABNORMAL HIGH (ref 101–111)
Creatinine, Ser: 0.69 mg/dL (ref 0.61–1.24)
GFR calc Af Amer: >60 mL/min (ref >60)
GFR calc non Af Amer: >60 mL/min (ref >60)
Glucose, Bld: 109 mg/dL — ABNORMAL HIGH (ref 65–99)
Potassium: 3.6 mmol/L (ref 3.5–5.1)
Sodium: 146 mmol/L — ABNORMAL HIGH (ref 135–145)
Total Bilirubin: 0.6 mg/dL (ref 0.3–1.2)
Total Protein: 8 g/dL (ref 6.5–8.1)

## 2017-09-11 LAB — ETHANOL: Alcohol, Ethyl (B): <10 mg/dL (ref <10)

## 2017-09-11 MED ORDER — ZIPRASIDONE MESYLATE 20 MG IM SOLR
20.0000 mg | Freq: Once | INTRAMUSCULAR | Status: AC
  Administered 2017-09-11: 20 mg via INTRAMUSCULAR
  Filled 2017-09-11: qty 20

## 2017-09-11 MED ORDER — LORAZEPAM 2 MG/ML IJ SOLN
2.0000 mg | Freq: Once | INTRAMUSCULAR | Status: AC
  Administered 2017-09-11: 2 mg via INTRAMUSCULAR
  Filled 2017-09-11: qty 1

## 2017-09-11 MED ORDER — STERILE WATER FOR INJECTION IJ SOLN
INTRAMUSCULAR | Status: AC
  Administered 2017-09-11: 10 mL
  Filled 2017-09-11: qty 10

## 2017-09-11 MED ORDER — DIPHENHYDRAMINE HCL 50 MG/ML IJ SOLN
50.0000 mg | Freq: Once | INTRAMUSCULAR | Status: AC
  Administered 2017-09-11: 50 mg via INTRAMUSCULAR
  Filled 2017-09-11: qty 1

## 2017-09-11 NOTE — Progress Notes (Addendum)
Pt alert, observed standing in milieu on initial contact. Ambulatory with a steady gait. Presents with blunted affect & is suspicious on approach with elective mutism. Pt  will not respond to writer or TTS staff at this time. Appears to be responding to internal stimuli, animated, smiling inappropriately; proceed to making with incoherent noises then back to blunted affect. Per IVC paper "Patient is a danger to harm himself and or others, he was committed a few weeks ago and after release he has gone downhill. He's not taking his medications. Inpatient hospitalization recommended. Pt refused vitals X2 when approach, nodded his head again "no" and pulled away. Refused to eat dinner when offered. Urine specimen cup placed on bedside table with verbal instructions. Emotional support and encouragement provided to pt. Q 15 minutes safety checks initiated without incident thus far.

## 2017-09-11 NOTE — Progress Notes (Signed)
Pt meets inpatient criteria per Nanine Means, DNP. Referral information was sent to the following hospitals: [facility]  [facility]  [facility]  CSW spoke with [facility] CSW, Janice Coffin earlier today about pt and assessed that Dr Otelia Santee (sp) of [facility] was familiar with pt and had requested referral information be sent to their facility for review. CSW will follow up with this.   Wells Guiles, LCSW, LCAS Disposition CSW [facility] B[facility]/TTS [phone]

## 2017-09-11 NOTE — ED Notes (Signed)
SBAR Report received from previous nurse. Pt received calm and visible on unit. Pt gave no meaningful answer to assessment questions related to current SI/ HI, A/V H, depression, anxiety, or pain at this time, and appears otherwise stable and free of distress. Pt is intermittently mumbling and pacing in hall   Pt reminded of camera surveillance, q 15 min rounds, and rules of the milieu. Will continue to assess.

## 2017-09-11 NOTE — ED Notes (Signed)
Patient observed in hall chanting words rapidly and making hand motions. Patient will remain monitored every 15 minutes.

## 2017-09-11 NOTE — ED Provider Notes (Signed)
[facility]-EMERGENCY DEPT Provider Note   CSN: [id number] Arrival date & time: 09/11/17  1620     History   Chief Complaint Chief Complaint  Patient presents with  . Medical Clearance    HPI [name] is a 22 y.o. male.  The history is provided by the patient. No language interpreter was used.  Mental Health Problem  Presenting symptoms: bizarre behavior, delusional and hallucinations   Patient accompanied by:  Law enforcement Degree of incapacity (severity):  Severe Onset quality:  Unable to specify Risk factors: recent psychiatric admission   Pt unable to give any history.  Pt looking around room.   Pt speaks random works. Pt seems to be hallucinating.  Level 5 due to Psychiatric disorder  Past Medical History:  Diagnosis Date  . ADD (attention deficit disorder)   . 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 Anemia   . Anxiety   . Bipolar disorder (HCC)   . Borderline personality disorder (HCC)   . Central auditory processing disorder   . Deliberate self-cutting   . Depressed   . Eczema   . Headache(784.0)   . Oppositional defiant disorder   . Schizophrenia [facility])     Patient Active Problem List   Diagnosis Date Noted  . Paranoid schizophrenia (HCC)   . Bipolar affective disorder, mixed, severe (HCC) 09/20/2016  . Cannabis use disorder, moderate, dependence (HCC) 06/05/2016  . Eating disorder Unspecified 05/22/2016  . Borderline personality disorder (HCC) 05/16/2016  . Tobacco use disorder 05/15/2016    Past Surgical History:  Procedure Laterality Date  . BACK SURGERY    . FRACTURE SURGERY    . NO PAST SURGERIES    . POSTERIOR LUMBAR FUSION N/A 02/19/2015   Procedure: LATERAL L-2 CORPECTOMY;  Surgeon: Lisbeth Renshaw, MD;  [facility] OR;  Service: Neurosurgery;  Laterality: N/A;  . POSTERIOR LUMBAR FUSION 4 LEVEL  02/19/2015   Procedure: Posterior T-12 - L-4 STABILIZATION OF POSTERIOR  LUMBAR;  Surgeon: Lisbeth Renshaw, MD;  Location: [facility] OR;  Service: Neurosurgery;;  . TIBIA IM NAIL INSERTION Left 02/20/2015   Procedure: INTRAMEDULLARY (IM) NAIL LEFT TIBIAL;  Surgeon: [name], MD;  Location: [facility] OR;  Service: Orthopedics;  Laterality: Left;        Home Medications    Prior to Admission medications   Medication Sig Start Date End Date Taking? Authorizing Provider  ARIPiprazole (ABILIFY) 15 MG tablet Take 15 mg by mouth at bedtime.    [provider]  topiramate (TOPAMAX) 25 MG tablet Take 25 mg by mouth 2 (two) times daily.    [provider]    Family History History reviewed. No pertinent family history.  Social History Social History   Tobacco Use  . Smoking status: Current Every Day Smoker    Packs/day: 3.00    Types: Cigarettes  . Smokeless tobacco: Never Used  Substance Use Topics  . Alcohol use: Yes    Alcohol/week: 1.2 oz    Types: 2 Shots of liquor per week    Comment:  weekends some   . Drug use: Not Currently    Types: Marijuana, Cocaine    Comment: Pt denies     Allergies   Clozapine; Prolixin [fluphenazine]; and Risperdal [risperidone]   Review of Systems Review of Systems  Unable to perform ROS: Psychiatric disorder  Psychiatric/Behavioral: Positive for hallucinations.     Physical Exam Updated Vital Signs BP 117/66 (BP Location: Right Arm)   Pulse 79  Resp 18   Ht  (1.702 m)   Wt 65.8 kg (145 lb)   SpO2 100%   BMI 22.71 kg/m   Physical Exam  Constitutional: He appears well-developed and well-nourished.  HENT:  Head: Normocephalic and atraumatic.  Neck: Normal range of motion.  Cardiovascular: Normal rate and normal heart sounds.  Pulmonary/Chest: Effort normal and breath sounds normal.  Neurological: He is alert.  Skin: Skin is warm.  Nursing note and vitals reviewed.    ED Treatments / Results  Labs (all labs ordered are listed, but only abnormal results are displayed) Labs  Reviewed  CBC WITH DIFFERENTIAL/PLATELET  COMPREHENSIVE METABOLIC PANEL  ETHANOL  RAPID URINE DRUG SCREEN, HOSP PERFORMED    EKG None  Radiology No results found.  Procedures Procedures (including critical care time)  Medications Ordered in ED Medications - No data to display   Initial Impression / Assessment and Plan / ED Course  I have reviewed the triage vital signs and the nursing notes.  Pertinent labs & imaging results that were available during my care of the patient were reviewed by me and considered in my medical decision making (see chart for details).      Pt has IVC papers by police.   TTS consult requested.  Final Clinical Impressions(s) / ED Diagnoses   Final diagnoses:  Schizophrenia, unspecified type [facility])    ED Discharge Orders    None       Elson Areas, [place] 09/11/17 Acquanetta Belling, MD 09/13/17 1242

## 2017-09-11 NOTE — ED Notes (Signed)
Requested urine sample.  

## 2017-09-11 NOTE — ED Notes (Signed)
Bed: WA28 Expected date:  Expected time:  Means of arrival:  Comments: EMS-hallucinations 

## 2017-09-11 NOTE — Progress Notes (Signed)
[facility] contacted CSW and offered an inpatient bed to pt. CSW contacted pt's mother Rubye Oaks (also his legal guardian), who stated, "They sent him home psychotic, so he's absolutely not going back". She feels comfortable with pt receiving care at [facility] and stated her first choice would be [facility]. CSW notified [facility] that bed was being declined. Pt is under review at [facility], [facility], and Art therapist.   Disposition will continue to assist with placement needs.   Wells Guiles, LCSW, LCAS Disposition CSW [facility]/TTS [phone]

## 2017-09-11 NOTE — ED Triage Notes (Signed)
Patient brought him by EMS, patient found on side of road, [employer] downtown taking out IVC papers.  Patient seems to having halluinations.  Patient is alert, unsure of orientation.  Per grandmother of patient she sts hes schizophenia and polysubstance abuse.

## 2017-09-11 NOTE — BH Assessment (Signed)
Tele Assessment Note   Patient Name: [name] MRN: [id number] Referring Physician: Osie Cheeks Location of [facility]: [facility] Location of Provider: Behavioral Health TTS Department  Chelsey Kimberley is an 22 y.o. male present to [facility] after being IVC'd by his ACTT Team with [facility]. Per IVC,"Patient is a danger to harm himself and or others, he was committed a few weeks ago and after release he has gone downhill. He's not taking his medications. Doctors recent visitation recommend he be committed to hospital. Patient came to door naked and clinching fist and very incoherent. He has a Schizophrenia disorder and Bi-polar." Patient refused to speak during assessment, present with elective mutism, not responding to Clinical research associate on interactions. Information for assessment obtained from prior hospitalization notes.   Disposition: Nanine Means, DNP, recommend inpatient tx     Diagnosis: F20.9    Schizophrenia    Past Medical History:  Past Medical History:  Diagnosis Date  . ADD (attention deficit disorder)   . 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 Anemia   . Anxiety   . Bipolar disorder (HCC)   . Borderline personality disorder (HCC)   . Central auditory processing disorder   . Deliberate self-cutting   . Depressed   . Eczema   . Headache(784.0)   . Oppositional defiant disorder   . Schizophrenia [facility])     Past Surgical History:  Procedure Laterality Date  . BACK SURGERY    . FRACTURE SURGERY    . NO PAST SURGERIES    . POSTERIOR LUMBAR FUSION N/A 02/19/2015   Procedure: LATERAL L-2 CORPECTOMY;  Surgeon: Lisbeth Renshaw, MD;  Location[facility] OR;  Service: Neurosurgery;  Laterality: N/A;  . POSTERIOR LUMBAR FUSION 4 LEVEL  02/19/2015   Procedure: Posterior T-12 - L-4 STABILIZATION OF POSTERIOR LUMBAR;  Surgeon: Lisbeth Renshaw, MD;  [facility];  Service: Neurosurgery;;  . TIBIA IM NAIL INSERTION Left 02/20/2015    Procedure: INTRAMEDULLARY (IM) NAIL LEFT TIBIAL;  Surgeon: [name], MD;  Location[facility];  Service: Orthopedics;  Laterality: Left;    Family History: History reviewed. No pertinent family history.  Social History:  reports that he has been smoking cigarettes.  He has been smoking about 3.00 packs per day. He has never used smokeless tobacco. He reports that he drinks about 1.2 oz of alcohol per week. He reports that he has current or past drug history. Drugs: Marijuana and Cocaine.  Additional Social History:  Alcohol / Drug Use Pain Medications: see MAR Prescriptions: see MAR Over the Counter: see MAR History of alcohol / drug use?: (unable to access)  CIWA: CIWA-Ar BP: 117/66 Pulse Rate: 79 COWS:    Allergies:  Allergies  Allergen Reactions  . Clozapine Hives and Other (See Comments)    Increased blood pressure  . Prolixin [Fluphenazine] Other (See Comments)    Hallucinations  . Risperdal [Risperidone] Other (See Comments)    Unknown    Home Medications:  (Not in a hospital admission)  OB/GYN Status:  No LMP for male patient.  General Assessment Data Location of Assessment: [facility] TTS Assessment: In system Is this a Tele or Face-to-Face Assessment?: Face-to-Face Is this an Initial Assessment or a Re-assessment for this encounter?: Initial Assessment Marital status: Single Maiden name: N/A Is patient pregnant?: No Pregnancy Status: No Living Arrangements: Parent Can pt return to current living arrangement?: Yes Admission Status: Involuntary Is patient capable of signing voluntary admission?: (mental state altered, pt IVC'd) Referral Source:  Self/Family/Friend Insurance type: Medicaid     Crisis Care Plan Living Arrangements: Parent Legal Guardian: Mother(Joi Radiation protection practitioner ) Name of Psychiatrist: [facility] Name of Therapist: [facility]  Education Status Is patient currently in school?: No Is the patient employed, unemployed or receiving  disability?: Unemployed  Risk to self with the past 6 months Suicidal Ideation: No Has patient been a risk to self within the past 6 months prior to admission? : No Suicidal Intent: No Has patient had any suicidal intent within the past 6 months prior to admission? : No Is patient at risk for suicide?: Yes(per notes and past history) Suicidal Plan?: No Has patient had any suicidal plan within the past 6 months prior to admission? : No Access to Means: No What has been your use of drugs/alcohol within the last 12 months?: Hx of cannabis(per notes) Previous Attempts/Gestures: Yes(per notes) How many times?: 4 Other Self Harm Risks: none known Triggers for Past Attempts: Unknown Intentional Self Injurious Behavior: Cutting(history per note) Comment - Self Injurious Behavior: per hx review Family Suicide History: No Recent stressful life event(s): Other (Comment)(unknown, per note pt has hx of legal issues) Persecutory voices/beliefs?: (unknown) Depression: (unknown, altered mental state) Substance abuse history and/or treatment for substance abuse?: Yes Suicide prevention information given to non-admitted patients: Not applicable  Risk to Others within the past 6 months Homicidal Ideation: Yes-Currently Present Does patient have any lifetime risk of violence toward others beyond the six months prior to admission? : Yes (comment)(per hx report) Thoughts of Harm to Others: No Current Homicidal Intent: No Current Homicidal Plan: No Access to Homicidal Means: No Identified Victim: none  History of harm to others?: No Assessment of Violence: In past 6-12 months(per hx notes) Violent Behavior Description: hx of threats to family per chart review Does patient have access to weapons?: No Criminal Charges Pending?: (unknown) Describe Pending Criminal Charges: unknown Does patient have a court date: (unknown) Is patient on probation?: Unknown  Psychosis Hallucinations: Auditory(hx of  schizophrentic d/o ) Delusions: Persecutory(hx of schizohrentic d/o)  Mental Status Report Appearance/Hygiene: In scrubs Eye Contact: Poor Motor Activity: Freedom of movement Speech: (mute; patient refused to speak ) Level of Consciousness: Quiet/awake Mood: (UTA) Affect: Unable to Assess Thought Processes: Unable to Assess Judgement: Impaired Orientation: Unable to assess Obsessive Compulsive Thoughts/Behaviors: Unable to Assess  Cognitive Functioning Concentration: Decreased Memory: Unable to Assess Is patient IDD: No Is patient DD?: No Insight: Unable to Assess Impulse Control: Unable to Assess Appetite: Poor Have you had any weight changes? : (UTA) Sleep: Unable to Assess Total Hours of Sleep: (UTA) Vegetative Symptoms: Unable to Assess  ADLScreening [facility] Assessment Services) Patient's cognitive ability adequate to safely complete daily activities?: Yes Patient able to express need for assistance with ADLs?: Yes Independently performs ADLs?: Yes (appropriate for developmental age)  Prior Inpatient Therapy Prior Inpatient Therapy: Yes Prior Therapy Dates: 2018, Mar/April 2019 Prior Therapy Facilty/Provider(s): [facility] Reason for Treatment: MH isues  Prior Outpatient Therapy Prior Outpatient Therapy: Yes Prior Therapy Dates: Current Prior Therapy Facilty/Provider(s): [facility] Reason for Treatment: MH issues Does patient have an ACCT team?: Yes Does patient have Intensive In-House Services?  : No Does patient have [facility] services? : No Does patient have P4CC services?: No  ADL Screening (condition at time of admission) Patient's cognitive ability adequate to safely complete daily activities?: Yes Is the patient deaf or have difficulty hearing?: No Does the patient have difficulty seeing, even when wearing glasses/contacts?: No Does the patient have  difficulty concentrating, remembering, or making decisions?: No Patient able to express need for  assistance with ADLs?: Yes Does the patient have difficulty dressing or bathing?: No Independently performs ADLs?: Yes (appropriate for developmental age) Does the patient have difficulty walking or climbing stairs?: No       Abuse/Neglect Assessment (Assessment to be complete while patient is alone) Abuse/Neglect Assessment Can Be Completed: Yes Physical Abuse: Denies Verbal Abuse: Denies Sexual Abuse: Denies Exploitation of patient/patient's resources: Denies Self-Neglect: Denies     Merchant navy officer (For Healthcare) Does Patient Have a Medical Advance Directive?: No Would patient like information on creating a medical advance directive?: No - Patient declined          Disposition:  Disposition Initial Assessment Completed for this Encounter: Yes(Jamison Lord, DNP, recommend inpt tx )   Brunella Wileman 09/11/2017 6:12 PM

## 2017-09-11 NOTE — Progress Notes (Signed)
Pt noted with increased verbal agitation, responding to internal stimuli. Observed yelling spontaneously in hall "fuck, fuck" and chanting incoherently while moving to the floor. Pt was escorted to his room, Psych NP notified and new order received for Geodon, Ativan and Benadryl all IM (see emar). Medication given per order. Continued encouragement and support offered to pt. Safety checks maintained.

## 2017-09-11 NOTE — ED Notes (Signed)
Pt aware of sample need and cup provided. Pt currently in bed with covers over head.

## 2017-09-11 NOTE — ED Notes (Signed)
Patient IVC by Anson [place], his case worker Patient recently committed and then d/c and then was noncompliant with taking his medications IVC paperwork brought by [facility]

## 2017-09-12 DIAGNOSIS — F200 Paranoid schizophrenia: Secondary | ICD-10-CM

## 2017-09-12 DIAGNOSIS — R451 Restlessness and agitation: Secondary | ICD-10-CM | POA: Diagnosis not present

## 2017-09-12 DIAGNOSIS — F17210 Nicotine dependence, cigarettes, uncomplicated: Secondary | ICD-10-CM | POA: Diagnosis not present

## 2017-09-12 DIAGNOSIS — R4587 Impulsiveness: Secondary | ICD-10-CM | POA: Diagnosis not present

## 2017-09-12 LAB — RAPID URINE DRUG SCREEN, HOSP PERFORMED
Amphetamines: NOT DETECTED
Barbiturates: NOT DETECTED
Benzodiazepines: POSITIVE — AB
Cocaine: NOT DETECTED
Opiates: NOT DETECTED
Tetrahydrocannabinol: NOT DETECTED

## 2017-09-12 MED ORDER — ZIPRASIDONE MESYLATE 20 MG IM SOLR: 20.0000 mg | Freq: Two times a day (BID) | INTRAMUSCULAR | Status: DC | PRN

## 2017-09-12 MED ORDER — ARIPIPRAZOLE 5 MG PO TABS
15.0000 mg | ORAL_TABLET | Freq: Every day | ORAL | Status: DC
  Administered 2017-09-12: 15 mg via ORAL
  Filled 2017-09-12: qty 1

## 2017-09-12 MED ORDER — ACETAMINOPHEN 325 MG PO TABS
650.0000 mg | ORAL_TABLET | ORAL | Status: DC | PRN
  Administered 2017-09-12: 650 mg via ORAL
  Filled 2017-09-12: qty 2

## 2017-09-12 NOTE — ED Notes (Signed)
Pt refusing to allow nursing staff obtain vs at this time, prior to transfer.

## 2017-09-12 NOTE — BH Assessment (Addendum)
[facility] Assessment Progress Note  Per Juanetta Beets, DO, this pt requires psychiatric hospitalization at this time.  Pt presents under IVC initiated by pt's [facility] ACT Team Case Worker, which Dr Sharma Covert has upheld.  At 09:31 Thayer Ohm calls from [facility] to report that pt has been accepted to their facility by Dr Geraldine Contras; they will be ready to receive pt at 10:30.  Dr Sharma Covert concurs with this decision.  Pt's nurse, Morrie Sheldon, has been notified, and agrees to call report to [phone].  Pt is to be transported via [employer].  Doylene Canning Behavioral Health Coordinator [phone]   Addendum:  At 09:55 this writer called pt's mother/guardian, [name] [phone]), notifying her of pt's disposition.  Doylene Canning, [place] Behavioral Health Coordinator [phone]

## 2017-09-12 NOTE — ED Notes (Signed)
[employer] on unit to transfer pt to [facility] per MD order. Personal property given to [employer] for transfer. Pt ambulatory off unit in police custody.

## 2017-09-12 NOTE — ED Notes (Signed)
Pt alert and speaking in disorganized half sentences. Pt described to writer being at "the house" and getting some chemicals on his skin. Also that he is feeling where he should not be feeling and having pain where he should not be feeling anything at all.

## 2017-09-12 NOTE — ED Notes (Signed)
Py up staring at desk. Pt was informed he has several hours before breakfast or morning rounds.

## 2017-09-12 NOTE — ED Notes (Signed)
Attempted to call nursing report to [facility], informed nurse will call back.

## 2017-09-12 NOTE — ED Notes (Signed)
Pt standing in hallway, laughing inappropriately, appears to be responding to internal stimuli. On approach, speech soft, thought blocking noted, disorganized. Encouragement and support provided. Special checks q 15 mins in place for safety, video monitoring in place. Will continue to monitor.

## 2017-09-12 NOTE — ED Notes (Signed)
Pt compliant with PO medication regimen with much encouragement. Pt speech disorganized, impossible to understand. Continues to laugh inappropriately.

## 2017-09-12 NOTE — Consult Note (Signed)
[facility] Consult   Reason for Consult:  Psychosis Referring Physician:  EDP Patient Identification: [name] MRN:  [id number] Principal Diagnosis: Paranoid schizophrenia [facility]) Diagnosis:   Patient Active Problem List   Diagnosis Date Noted  . Paranoid schizophrenia ([place]) [F20.0]   . Bipolar affective disorder, mixed, severe ([place]) [F31.63] 09/20/2016  . Cannabis use disorder, moderate, dependence ([place]) [F12.20] 06/05/2016  . Eating disorder Unspecified [F50.9] 05/22/2016  . Borderline personality disorder ([place]) [F60.3] 05/16/2016  . Tobacco use disorder [F17.200] 05/15/2016    Total Time spent with patient: 30 minutes  Subjective:   [name] is a 22 y.o. male patient admitted with psychosis.  HPI:   Per chart review, patient was admitted under IVC for psychosis. He was seen by his ACT team yesterday at home. He came to the door naked and was clinching his fist and incoherent. He is disorganized in thought process and behavior. He is smiling inappropriately and laughing to himself. He appears to be responding to internal stimuli. He was recently hospitalized at [facility] and has not been taking his medications since discharge.   Past Psychiatric History: Schizophrenia, anxiety, bipolar disorder, borderline personality disorder and ADHD.   Risk to Self: Suicidal Ideation: No Suicidal Intent: No Is patient at risk for suicide?: Yes(per notes and past history) Suicidal Plan?: No Access to Means: No What has been your use of drugs/alcohol within the last 12 months?: Hx of cannabis(per notes) How many times?: 4 Other Self Harm Risks: none known Triggers for Past Attempts: Unknown Intentional Self Injurious Behavior: Cutting(history per note) Comment - Self Injurious Behavior: per hx review Risk to Others: Homicidal Ideation: Yes-Currently Present Thoughts of Harm to Others: No Current Homicidal Intent: No Current Homicidal Plan:  No Access to Homicidal Means: No Identified Victim: none  History of harm to others?: No Assessment of Violence: In past 6-12 months(per hx notes) Violent Behavior Description: hx of threats to family per chart review Does patient have access to weapons?: No Criminal Charges Pending?: (unknown) Describe Pending Criminal Charges: unknown Does patient have a court date: (unknown) Prior Inpatient Therapy: Prior Inpatient Therapy: Yes Prior Therapy Dates: 2018, Mar/April 2019 Prior Therapy Facilty/Provider(s): [name],  [name] Reason for Treatment: MH isues Prior Outpatient Therapy: Prior Outpatient Therapy: Yes Prior Therapy Dates: Current Prior Therapy Facilty/Provider(s): [name] Reason for Treatment: MH issues Does patient have an ACCT team?: Yes Does patient have Intensive In-House Services?  : No Does patient have [facility] services? : No Does patient have P4CC services?: No  Past Medical History:  Past Medical History:  Diagnosis Date  . ADD (attention deficit disorder)   . 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 Anemia   . Anxiety   . Bipolar disorder ([place])   . Borderline personality disorder ([place])   . Central auditory processing disorder   . Deliberate self-cutting   . Depressed   . Eczema   . Headache(784.0)   . Oppositional defiant disorder   . Schizophrenia [facility])     Past Surgical History:  Procedure Laterality Date  . BACK SURGERY    . FRACTURE SURGERY    . NO PAST SURGERIES    . POSTERIOR LUMBAR FUSION N/A 02/19/2015   Procedure: LATERAL L-2 CORPECTOMY;  Surgeon: [name], MD;  Location: [place];  Service: Neurosurgery;  Laterality: N/A;  . POSTERIOR LUMBAR FUSION 4 LEVEL  02/19/2015   Procedure: Posterior T-12 - L-4 STABILIZATION OF POSTERIOR LUMBAR;  Surgeon: [name],  MD;  Location: [place];  Service: Neurosurgery;;  . TIBIA IM NAIL INSERTION Left 02/20/2015   Procedure: INTRAMEDULLARY (IM) NAIL LEFT  TIBIAL;  Surgeon: Rod Can, MD;  Location: [place];  Service: Orthopedics;  Laterality: Left;   Family History: History reviewed. No pertinent family history. Family Psychiatric  History: Mother has mental health problems.  Social History:  Social History   Substance and Sexual Activity  Alcohol Use Yes  . Alcohol/week: 1.2 oz  . Types: 2 Shots of liquor per week   Comment:  weekends some      Social History   Substance and Sexual Activity  Drug Use Not Currently  . Types: Marijuana, Cocaine   Comment: Pt denies    Social History   Socioeconomic History  . Marital status: Single    Spouse name: Not on file  . Number of children: Not on file  . Years of education: Not on file  . Highest education level: Not on file  Occupational History  . Not on file  Social Needs  . Financial resource strain: Not on file  . Food insecurity:    Worry: Not on file    Inability: Not on file  . Transportation needs:    Medical: Not on file    Non-medical: Not on file  Tobacco Use  . Smoking status: Current Every Day Smoker    Packs/day: 3.00    Types: Cigarettes  . Smokeless tobacco: Never Used  Substance and Sexual Activity  . Alcohol use: Yes    Alcohol/week: 1.2 oz    Types: 2 Shots of liquor per week    Comment:  weekends some   . Drug use: Not Currently    Types: Marijuana, Cocaine    Comment: Pt denies  . Sexual activity: Yes    Birth control/protection: None    Comment: pt reluctant to answer questions and frequently stated " I dont know"  Lifestyle  . Physical activity:    Days per week: Not on file    Minutes per session: Not on file  . Stress: Not on file  Relationships  . Social connections:    Talks on phone: Not on file    Gets together: Not on file    Attends religious service: Not on file    Active member of club or organization: Not on file    Attends meetings of clubs or organizations: Not on file    Relationship status: Not on file  Other Topics  Concern  . Not on file  Social History Narrative   ** Merged History Encounter **       Additional Social History: N/A    Allergies:   Allergies  Allergen Reactions  . Clozapine Hives and Other (See Comments)    Increased blood pressure  . Prolixin [Fluphenazine] Other (See Comments)    Hallucinations  . Risperdal [Risperidone] Other (See Comments)    Unknown    Labs:  Results for orders placed or performed during the hospital encounter of 09/11/17 (from the past 48 hour(s))  Comprehensive metabolic panel     Status: Abnormal   Collection Time: 09/11/17  5:13 PM  Result Value Ref Range   Sodium 146 (H) 135 - 145 mmol/L   Potassium 3.6 3.5 - 5.1 mmol/L   Chloride 112 (H) 101 - 111 mmol/L   CO2 24 22 - 32 mmol/L   Glucose, Bld 109 (H) 65 - 99 mg/dL   BUN 15 6 - 20 mg/dL  Creatinine, Ser 0.69 0.61 - 1.24 mg/dL   Calcium 9.9 8.9 - 10.3 mg/dL   Total Protein 8.0 6.5 - 8.1 g/dL   Albumin 4.4 3.5 - 5.0 g/dL   AST 30 15 - 41 U/L   ALT 31 17 - 63 U/L   Alkaline Phosphatase 76 38 - 126 U/L   Total Bilirubin 0.6 0.3 - 1.2 mg/dL   GFR calc non Af Amer >60 >60 mL/min   GFR calc Af Amer >60 >60 mL/min    Comment: (NOTE) The eGFR has been calculated using the CKD EPI equation. This calculation has not been validated in all clinical situations. eGFR's persistently <60 mL/min signify possible Chronic Kidney Disease.    Anion gap 10 5 - 15    Comment: Performed at [facility], [place] [address], [place], [place] [address]  Ethanol     Status: None   Collection Time: 09/11/17  5:13 PM  Result Value Ref Range   Alcohol, Ethyl (B) <10 <10 mg/dL    Comment:        LOWEST DETECTABLE LIMIT FOR SERUM ALCOHOL IS 10 mg/dL FOR MEDICAL PURPOSES ONLY Performed at [place] [address] [place], [place] [address]   CBC with Diff     Status: None   Collection Time: 09/11/17  5:13 PM  Result Value Ref Range   WBC 5.4 4.0 - 10.5 K/uL   RBC 4.76  4.22 - 5.81 MIL/uL   Hemoglobin 13.4 13.0 - 17.0 g/dL   HCT 40.7 39.0 - 52.0 %   MCV 85.5 78.0 - 100.0 fL   MCH 28.2 26.0 - 34.0 pg   MCHC 32.9 30.0 - 36.0 g/dL   RDW 13.7 11.5 - 15.5 %   Platelets 258 150 - 400 K/uL   Neutrophils Relative % 58 %   Neutro Abs 3.2 1.7 - 7.7 K/uL   Lymphocytes Relative 33 %   Lymphs Abs 1.8 0.7 - 4.0 K/uL   Monocytes Relative 8 %   Monocytes Absolute 0.4 0.1 - 1.0 K/uL   Eosinophils Relative 1 %   Eosinophils Absolute 0.0 0.0 - 0.7 K/uL   Basophils Relative 0 %   Basophils Absolute 0.0 0.0 - 0.1 K/uL    Comment: Performed at [facility], [place] [address], [place], [place] [address]  Urine rapid drug screen (hosp performed)     Status: Abnormal   Collection Time: 09/12/17  4:22 AM  Result Value Ref Range   Opiates NONE DETECTED NONE DETECTED   Cocaine NONE DETECTED NONE DETECTED   Benzodiazepines POSITIVE (A) NONE DETECTED   Amphetamines NONE DETECTED NONE DETECTED   Tetrahydrocannabinol NONE DETECTED NONE DETECTED   Barbiturates NONE DETECTED NONE DETECTED    Comment: (NOTE) DRUG SCREEN FOR MEDICAL PURPOSES ONLY.  IF CONFIRMATION IS NEEDED FOR ANY PURPOSE, NOTIFY LAB WITHIN 5 DAYS. LOWEST DETECTABLE LIMITS FOR URINE DRUG SCREEN Drug Class                     Cutoff (ng/mL) Amphetamine and metabolites    1000 Barbiturate and metabolites    200 Benzodiazepine                 280 Tricyclics and metabolites     300 Opiates and metabolites        300 Cocaine and metabolites        300 THC  50 Performed at [facility], [place] [address], [place], [place] [address]     Current Facility-Administered Medications  Medication Dose Route Frequency Provider Last Rate Last Dose  . acetaminophen (TYLENOL) tablet 650 mg  650 mg Oral Q4H PRN Daleen Bo, MD   650 mg at 09/12/17 0510   Current Outpatient Medications  Medication Sig Dispense Refill  . ARIPiprazole (ABILIFY) 15 MG tablet  Take 15 mg by mouth at bedtime.    . topiramate (TOPAMAX) 25 MG tablet Take 25 mg by mouth 2 (two) times daily.      Musculoskeletal: Strength & Muscle Tone: within normal limits Gait & Station: normal Patient leans: N/A  Psychiatric Specialty Exam: Physical Exam  Nursing note and vitals reviewed. Constitutional: He appears well-developed and well-nourished.  HENT:  Head: Normocephalic and atraumatic.  Neck: Normal range of motion.  Respiratory: Effort normal.  Musculoskeletal: Normal range of motion.  Neurological: He is alert.  Psychiatric: Thought content normal. His affect is blunt. His speech is tangential. He is actively hallucinating. Cognition and memory are impaired. He expresses impulsivity.    Review of Systems  Unable to perform ROS: Mental status change  Psychiatric/Behavioral: Negative for suicidal ideas.    Blood pressure 115/84, pulse 92, temperature 98.8 F (37.1 C), temperature source Oral, resp. rate 20, height _0  (1.702 m), weight 65.8 kg (145 lb), SpO2 95 %.Body mass index is 22.71 kg/m.  General Appearance: Disheveled, young, African American male with unkempt hair who is wearing paper hospital scrubs and standing in the hall. NAD.   Eye Contact:  Fair  Speech:  Blocked  Volume:  Decreased  Mood:  Euthymic  Affect:  Blunt  Thought Process:  Disorganized and Descriptions of Associations: Tangential  Orientation:  Other:  To person  Thought Content:  Tangential  Suicidal Thoughts:  No  Homicidal Thoughts:  No  Memory:  Immediate;   Fair Recent;   Fair Remote;   Fair  Judgement:  Impaired  Insight:  Lacking  Psychomotor Activity:  Normal  Concentration:  Concentration: Fair and Attention Span: Fair  Recall:  [employer] of Knowledge:  Fair  Language:  Fair  Akathisia:  No  Handed:  Right  AIMS (if indicated):   N/A  Assets:  Housing  ADL's:  Impaired  Cognition: Impaired due to psychiatric illness.   Sleep:   N/A   Assessment: [name] is a 22 y.o. male who was admitted with psychosis in the setting of poor medication compliance. He warrants inpatient psychiatric hospitalization for stabilization and treatment.   Treatment Plan Summary: Daily contact with patient to assess and evaluate symptoms and progress in treatment and Medication management  -Restart Abilify 15 mg qhs for psychosis. Patient will need LAI due to poor medication compliance.  -Ordered Geodon 20 mg BID PRN for agitation.   Disposition: Recommend psychiatric Inpatient admission when medically cleared.  Faythe Dingwall, DO 09/12/2017 9:00 AM

## 2017-10-28 ENCOUNTER — Emergency Department (HOSPITAL_COMMUNITY)
Admission: EM | Admit: 2017-10-28 | Discharge: 2017-10-29 | Disposition: A | Discharge status: Home / Self Care | Payer: Medicaid Other | Attending: Emergency Medicine | Admitting: Emergency Medicine

## 2017-10-28 ENCOUNTER — Encounter (HOSPITAL_COMMUNITY): Payer: Self-pay

## 2017-10-28 ENCOUNTER — Other Ambulatory Visit: Payer: Self-pay

## 2017-10-28 DIAGNOSIS — F419 Anxiety disorder, unspecified: Secondary | ICD-10-CM | POA: Insufficient documentation

## 2017-10-28 DIAGNOSIS — Z5321 Procedure and treatment not carried out due to patient leaving prior to being seen by health care provider: Secondary | ICD-10-CM | POA: Diagnosis not present

## 2017-10-28 NOTE — ED Triage Notes (Signed)
Onset 1 month anxiety, worse the past week.  Pt states he "can't take it anymore".  Denies SI/HI.  Pt states he is taking medications.  Pt calm at triage.  Denies hallucinations.  Last used marijuana and crack yesterday.  Pt states he usually snorts cocaine.  Pt had prescription for Klonopin but is out now.

## 2017-10-29 NOTE — ED Notes (Signed)
Pt called C28 no answer

## 2017-10-29 NOTE — ED Notes (Signed)
No answer c3 for room assignment

## 2017-10-29 NOTE — ED Notes (Signed)
Pt. Called for vitals x3.

## 2017-10-30 NOTE — ED Notes (Signed)
10/29/2017, Called listed phone numbers, no answer at one and the other has been disconnected.

## 2017-11-20 ENCOUNTER — Other Ambulatory Visit: Payer: Self-pay

## 2017-11-20 ENCOUNTER — Encounter (HOSPITAL_COMMUNITY): Payer: Self-pay | Admitting: Emergency Medicine

## 2017-11-20 ENCOUNTER — Emergency Department (HOSPITAL_COMMUNITY)
Admission: EM | Admit: 2017-11-20 | Discharge: 2017-11-20 | Disposition: A | Discharge status: Home / Self Care | Payer: Medicaid Other | Attending: Emergency Medicine | Admitting: Emergency Medicine

## 2017-11-20 DIAGNOSIS — Z79899 Other long term (current) drug therapy: Secondary | ICD-10-CM | POA: Insufficient documentation

## 2017-11-20 DIAGNOSIS — F209 Schizophrenia, unspecified: Secondary | ICD-10-CM | POA: Diagnosis not present

## 2017-11-20 DIAGNOSIS — F329 Major depressive disorder, single episode, unspecified: Secondary | ICD-10-CM | POA: Diagnosis present

## 2017-11-20 DIAGNOSIS — F17210 Nicotine dependence, cigarettes, uncomplicated: Secondary | ICD-10-CM | POA: Diagnosis not present

## 2017-11-20 DIAGNOSIS — R45851 Suicidal ideations: Secondary | ICD-10-CM

## 2017-11-20 LAB — COMPREHENSIVE METABOLIC PANEL
ALT: 16 U/L (ref 0–44)
AST: 20 U/L (ref 15–41)
Albumin: 4 g/dL (ref 3.5–5.0)
Alkaline Phosphatase: 74 U/L (ref 38–126)
Anion gap: 8 (ref 5–15)
BUN: 6 mg/dL (ref 6–20)
CO2: 28 mmol/L (ref 22–32)
Calcium: 9.6 mg/dL (ref 8.9–10.3)
Chloride: 105 mmol/L (ref 98–111)
Creatinine, Ser: 0.87 mg/dL (ref 0.61–1.24)
GFR calc Af Amer: >60 mL/min (ref >60)
GFR calc non Af Amer: >60 mL/min (ref >60)
Glucose, Bld: 80 mg/dL (ref 70–99)
Potassium: 4 mmol/L (ref 3.5–5.1)
Sodium: 141 mmol/L (ref 135–145)
Total Bilirubin: 0.4 mg/dL (ref 0.3–1.2)
Total Protein: 6.8 g/dL (ref 6.5–8.1)

## 2017-11-20 LAB — CBC
HCT: 48.3 % (ref 39.0–52.0)
Hemoglobin: 14.9 g/dL (ref 13.0–17.0)
MCH: 26.5 pg (ref 26.0–34.0)
MCHC: 30.8 g/dL (ref 30.0–36.0)
MCV: 85.8 fL (ref 78.0–100.0)
Platelets: 227 10*3/uL (ref 150–400)
RBC: 5.63 MIL/uL (ref 4.22–5.81)
RDW: 13.2 % (ref 11.5–15.5)
WBC: 5.3 10*3/uL (ref 4.0–10.5)

## 2017-11-20 LAB — RAPID URINE DRUG SCREEN, HOSP PERFORMED
Amphetamines: NOT DETECTED
Benzodiazepines: NOT DETECTED
Cocaine: NOT DETECTED
Opiates: NOT DETECTED
Tetrahydrocannabinol: POSITIVE — AB

## 2017-11-20 LAB — ACETAMINOPHEN LEVEL: Acetaminophen (Tylenol), Serum: <10 ug/mL — ABNORMAL LOW (ref 10–30)

## 2017-11-20 LAB — ETHANOL: Alcohol, Ethyl (B): <10 mg/dL (ref <10)

## 2017-11-20 LAB — SALICYLATE LEVEL: Salicylate Lvl: <7 mg/dL (ref 2.8–30.0)

## 2017-11-20 MED ORDER — TOPIRAMATE 25 MG PO TABS: 25.0000 mg | ORAL_TABLET | Freq: Two times a day (BID) | ORAL | Status: DC

## 2017-11-20 MED ORDER — ACETAMINOPHEN 325 MG PO TABS: 650.0000 mg | ORAL_TABLET | ORAL | Status: DC | PRN

## 2017-11-20 MED ORDER — NICOTINE 21 MG/24HR TD PT24: 21.0000 mg | MEDICATED_PATCH | Freq: Every day | TRANSDERMAL | Status: DC

## 2017-11-20 MED ORDER — OLANZAPINE 5 MG PO TBDP
10.0000 mg | ORAL_TABLET | Freq: Three times a day (TID) | ORAL | Status: DC | PRN
  Filled 2017-11-20: qty 2

## 2017-11-20 MED ORDER — ONDANSETRON HCL 4 MG PO TABS: 4.0000 mg | ORAL_TABLET | Freq: Three times a day (TID) | ORAL | Status: DC | PRN

## 2017-11-20 MED ORDER — LORAZEPAM 1 MG PO TABS: 1.0000 mg | ORAL_TABLET | ORAL | Status: DC | PRN

## 2017-11-20 MED ORDER — ZIPRASIDONE MESYLATE 20 MG IM SOLR: 20.0000 mg | INTRAMUSCULAR | Status: DC | PRN

## 2017-11-20 MED ORDER — ARIPIPRAZOLE 5 MG PO TABS: 15.0000 mg | ORAL_TABLET | Freq: Every day | ORAL | Status: DC

## 2017-11-20 NOTE — ED Notes (Signed)
tts placed at bedside

## 2017-11-20 NOTE — ED Notes (Signed)
Sitter at bedside.

## 2017-11-20 NOTE — ED Notes (Signed)
Ordered pt's breakfast tray 

## 2017-11-20 NOTE — ED Triage Notes (Signed)
Pt changed into paper scrubs, wonded by security.

## 2017-11-20 NOTE — BH Assessment (Signed)
Tele Assessment Note   Patient Name: [name] MRN: [id number] Referring Physician: Dr. Blinda Leatherwood Location of Patient: [facility] Location of Provider: Behavioral Health TTS Department  Susie Cassette [name] is an 22 y.o. male. Pt denies SI/HI and AVH. Pt denies previous SI attempts. Per Epic notes the Pt informed ED staff that he was suicidal with a plan to jump off the bridge. Pt denied the above referenced. Pt states he is not in need of psychic help. Pt has an ACTT team with [facility]. Pt states he is compliant with medication. Pt reports marijuana use. Pt reports multiple hospitalizations.   Shuvon, NP recommends D/C.  Diagnosis:  F20.9 Schizophrenia  Past Medical History:  Past Medical History:  Diagnosis Date  . ADD (attention deficit disorder)   . 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 Anemia   . Anxiety   . Bipolar disorder ([facility])   . Borderline personality disorder ([facility])   . Central auditory processing disorder   . Deliberate self-cutting   . Depressed   . Eczema   . Headache(784.0)   . Oppositional defiant disorder   . Schizophrenia [facility])     Past Surgical History:  Procedure Laterality Date  . BACK SURGERY    . FRACTURE SURGERY    . NO PAST SURGERIES    . POSTERIOR LUMBAR FUSION N/A 02/19/2015   Procedure: LATERAL L-2 CORPECTOMY;  Surgeon: Lisbeth Renshaw, MD;  [facility];  Service: Neurosurgery;  Laterality: N/A;  . POSTERIOR LUMBAR FUSION 4 LEVEL  02/19/2015   Procedure: Posterior T-12 - L-4 STABILIZATION OF POSTERIOR LUMBAR;  Surgeon: Lisbeth Renshaw, MD;  [facility];  Service: Neurosurgery;;  . TIBIA IM NAIL INSERTION Left 02/20/2015   Procedure: INTRAMEDULLARY (IM) NAIL LEFT TIBIAL;  Surgeon: [name], MD;  [facility];  Service: Orthopedics;  Laterality: Left;    Family History: History reviewed. No pertinent family history.  Social History:  reports that he has been  smoking cigarettes.  He has been smoking about 1.00 pack per day. He has never used smokeless tobacco. He reports that he drinks about 1.2 oz of alcohol per week. He reports that he has current or past drug history. Drugs: Marijuana and Cocaine.  Additional Social History:  Alcohol / Drug Use Pain Medications: please see mar Prescriptions: please see mar Over the Counter: please see mar History of alcohol / drug use?: Yes Longest period of sobriety (when/how long): unknown Substance #1 Name of Substance 1: marijuana 1 - Age of First Use: unknown 1 - Amount (size/oz): unknown 1 - Frequency: unknown 1 - Duration: ongoing 1 - Last Use / Amount: unknown  CIWA: CIWA-Ar BP: 114/65 Pulse Rate: (!) 58 COWS:    Allergies:  Allergies  Allergen Reactions  . Clozapine Hives and Other (See Comments)    Increased blood pressure  . Prolixin [Fluphenazine] Other (See Comments)    Hallucinations  . Risperdal [Risperidone] Other (See Comments)    Unknown    Home Medications:  (Not in a hospital admission)  OB/GYN Status:  No LMP for male patient.  General Assessment Data Location of Assessment: [facility] ED TTS Assessment: In system Is this a Tele or Face-to-Face Assessment?: Tele Assessment Is this an Initial Assessment or a Re-assessment for this encounter?: Initial Assessment Marital status: Single Maiden name: NA Is patient pregnant?: No Pregnancy Status: No Living Arrangements: Parent Can pt return to current living arrangement?: Yes Admission Status: Voluntary Is patient capable of signing voluntary admission?:  Yes Referral Source: Self/Family/Friend Insurance type: Medicaid     Crisis Care Plan Living Arrangements: Parent Legal Guardian: Mother Name of Psychiatrist: [facility] Name of Therapist: [facility]  Education Status Is patient currently in school?: No Is the patient employed, unemployed or receiving disability?: Unemployed  Risk to self  with the past 6 months Suicidal Ideation: No Has patient been a risk to self within the past 6 months prior to admission? : No Suicidal Intent: No Has patient had any suicidal intent within the past 6 months prior to admission? : No Is patient at risk for suicide?: No Suicidal Plan?: No Has patient had any suicidal plan within the past 6 months prior to admission? : No Access to Means: No What has been your use of drugs/alcohol within the last 12 months?: NA Previous Attempts/Gestures: No How many times?: 0 Other Self Harm Risks: NA Triggers for Past Attempts: None known Intentional Self Injurious Behavior: None Family Suicide History: No Recent stressful life event(s): Other (Comment)(unknown) Persecutory voices/beliefs?: No Depression: No Depression Symptoms: (pt denies) Substance abuse history and/or treatment for substance abuse?: No Suicide prevention information given to non-admitted patients: Not applicable  Risk to Others within the past 6 months Homicidal Ideation: No Does patient have any lifetime risk of violence toward others beyond the six months prior to admission? : No Thoughts of Harm to Others: No Current Homicidal Intent: No Current Homicidal Plan: No Access to Homicidal Means: No Identified Victim: NA History of harm to others?: No Assessment of Violence: None Noted Violent Behavior Description: NA Does patient have access to weapons?: No Criminal Charges Pending?: No Does patient have a court date: No Is patient on probation?: No  Psychosis Hallucinations: None noted Delusions: None noted  Mental Status Report Appearance/Hygiene: Unremarkable Eye Contact: Fair Motor Activity: Freedom of movement Speech: Logical/coherent Level of Consciousness: Alert Mood: Depressed Affect: Depressed Anxiety Level: Minimal Thought Processes: Coherent, Relevant Judgement: Unimpaired Orientation: Person, Place, Time, Situation Obsessive Compulsive  Thoughts/Behaviors: None  Cognitive Functioning Concentration: Normal Memory: Recent Intact, Remote Intact Is patient IDD: No Is patient DD?: No Insight: Fair Impulse Control: Fair Appetite: Fair Have you had any weight changes? : No Change Sleep: No Change Total Hours of Sleep: 7 Vegetative Symptoms: None  ADLScreening [facility] Assessment Services) Patient's cognitive ability adequate to safely complete daily activities?: Yes Patient able to express need for assistance with ADLs?: Yes Independently performs ADLs?: Yes (appropriate for developmental age)  Prior Inpatient Therapy Prior Inpatient Therapy: Yes Prior Therapy Dates: unknown Prior Therapy Facilty/Provider(s): unknown Reason for Treatment: unknown  Prior Outpatient Therapy Prior Outpatient Therapy: Yes Prior Therapy Dates: current Prior Therapy Facilty/Provider(s): [facility] Reason for Treatment: Schizophrenia Does patient have an ACCT team?: No Does patient have Intensive In-House Services?  : No Does patient have [facility] services? : No Does patient have P4CC services?: No  ADL Screening (condition at time of admission) Patient's cognitive ability adequate to safely complete daily activities?: Yes Is the patient deaf or have difficulty hearing?: No Does the patient have difficulty concentrating, remembering, or making decisions?: No Patient able to express need for assistance with ADLs?: Yes Does the patient have difficulty dressing or bathing?: No Independently performs ADLs?: Yes (appropriate for developmental age) Does the patient have difficulty walking or climbing stairs?: No Weakness of Legs: None Weakness of Arms/Hands: None       Abuse/Neglect Assessment (Assessment to be complete while patient is alone) Abuse/Neglect Assessment Can Be Completed: Yes Physical Abuse: Denies Verbal Abuse:  Denies Sexual Abuse: Denies Exploitation of patient/patient's resources: Denies     Dispensing opticianAdvance  Directives (For Healthcare) Does Patient Have a Medical Advance Directive?: No Would patient like information on creating a medical advance directive?: No - Patient declined    Additional Information 1:1 In Past 12 Months?: No CIRT Risk: No Elopement Risk: No Does patient have medical clearance?: Yes     Disposition:  Disposition Initial Assessment Completed for this Encounter: Yes Disposition of Patient: Discharge Patient refused recommended treatment: No  This service was provided via telemedicine using a 2-way, interactive audio and video technology.  Names of all persons participating in this telemedicine service and their role in this encounter. Name: Assunta FoundShuvon Rankin Role: NP  Name:  Role:   Name:  Role:   Name:  Role:     Emmit PomfretLevette,Kalie Cabral D 11/20/2017 10:29 AM

## 2017-11-20 NOTE — Discharge Instructions (Signed)
Please return for any problem.  Follow-up with a regular care provider as instructed. 

## 2017-11-20 NOTE — Consult Note (Signed)
  Tele Assessment  [name], 22 y.o., male [facility] with complaints of suicidal ideation.  Patient seen via telepsych by TTS and this provider; chart reviewed and consulted with Dr. Lucianne MussKumar on 11/20/17.  On evaluation [name] reports that he really doesn't know why he came to the hospital.  "I was a little intoxicated, high and I guess that made me feel depressed."  Patient states that he has been staying with his mother and has been off of his medications because he doesn't have access to medications at his mothers house.  States that he has ACTT services with [facility] but he is not see on a weekly basis.  Patient denies suicidal/self-harm/homicidal ideation, psychosis, and paranoia.  Patient states that he feels safe to go home and have not thoughts of harming himself. During evaluation [name] is alert/oriented x 4; calm/cooperative with pleasant affect.  He does not appear to be responding to internal/external stimuli or delusional thoughts.  Patient denies suicidal/self-harm/homicidal ideation, psychosis, and paranoia.  Patient answered question appropriately.  Patient psychiatrically cleared  For detailed note see TTS tele assessment note   Recommendations:  Follow up with current outpatient psychiatric provider and ACTT team.  Disposition: No evidence of imminent risk to self or others at present.   Patient does not meet criteria for psychiatric inpatient admission. Discussed crisis plan, support from social network, calling 911, coming to the Emergency Department, and calling Suicide Hotline.      Spoke with Dr. Rodena MedinMessick; informed of above recommendation and disposition  Myrical Andujo B. Keenya Matera, NP

## 2017-11-20 NOTE — ED Provider Notes (Signed)
[facility] EMERGENCY DEPARTMENT Provider Note   CSN: [id number] Arrival date & time: 11/20/17  0131     History   Chief Complaint Chief Complaint  Patient presents with  . Suicidal    HPI [name] is a 22 y.o. male.  Patient presents to the emergency department for psychiatric evaluation.  Patient reports that he was walking and started thinking about different issues.  This started to get him depressed and he started to think about killing himself.  Patient was concerned that he might jump off a building, presented to the ER instead.  He is not homicidal.     Past Medical History:  Diagnosis Date  . ADD (attention deficit disorder)   . 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 Anemia   . Anxiety   . Bipolar disorder (HCC)   . Borderline personality disorder (HCC)   . Central auditory processing disorder   . Deliberate self-cutting   . Depressed   . Eczema   . Headache(784.0)   . Oppositional defiant disorder   . Schizophrenia [facility])     Patient Active Problem List   Diagnosis Date Noted  . Paranoid schizophrenia (HCC)   . Cannabis use disorder, moderate, dependence (HCC) 06/05/2016  . Eating disorder Unspecified 05/22/2016  . Borderline personality disorder (HCC) 05/16/2016  . Tobacco use disorder 05/15/2016    Past Surgical History:  Procedure Laterality Date  . BACK SURGERY    . FRACTURE SURGERY    . NO PAST SURGERIES    . POSTERIOR LUMBAR FUSION N/A 02/19/2015   Procedure: LATERAL L-2 CORPECTOMY;  Surgeon: Lisbeth Renshaw, MD;  Location: [facility] OR;  Service: Neurosurgery;  Laterality: N/A;  . POSTERIOR LUMBAR FUSION 4 LEVEL  02/19/2015   Procedure: Posterior T-12 - L-4 STABILIZATION OF POSTERIOR LUMBAR;  Surgeon: Lisbeth Renshaw, MD;  Location: [facility] OR;  Service: Neurosurgery;;  . TIBIA IM NAIL INSERTION Left 02/20/2015   Procedure: INTRAMEDULLARY (IM) NAIL LEFT TIBIAL;  Surgeon: [name], MD;  Location: [facility] OR;  Service: Orthopedics;  Laterality: Left;        Home Medications    Prior to Admission medications   Medication Sig Start Date End Date Taking? Authorizing Provider  ARIPiprazole (ABILIFY) 15 MG tablet Take 15 mg by mouth at bedtime.    [provider]  topiramate (TOPAMAX) 25 MG tablet Take 25 mg by mouth 2 (two) times daily.    [provider]    Family History No family history on file.  Social History Social History   Tobacco Use  . Smoking status: Current Every Day Smoker    Packs/day: 1.00    Types: Cigarettes  . Smokeless tobacco: Never Used  . Tobacco comment: 1-3 packs   Substance Use Topics  . Alcohol use: Yes    Alcohol/week: 1.2 oz    Types: 2 Shots of liquor per week    Comment:  weekends some   . Drug use: Yes    Types: Marijuana, Cocaine    Comment: crack & marijuana last used 10-27-16     Allergies   Clozapine; Prolixin [fluphenazine]; and Risperdal [risperidone]   Review of Systems Review of Systems  Psychiatric/Behavioral: Positive for dysphoric mood and suicidal ideas.  All other systems reviewed and are negative.    Physical Exam Updated Vital Signs BP (!) 98/59 (BP Location: Right Arm)   Pulse (!) 50   Temp 98.2 F (36.8 C)   Resp 14  SpO2 99%   Physical Exam  Constitutional: He is oriented to person, place, and time. He appears well-developed and well-nourished. No distress.  HENT:  Head: Normocephalic and atraumatic.  Right Ear: Hearing normal.  Left Ear: Hearing normal.  Nose: Nose normal.  Mouth/Throat: Oropharynx is clear and moist and mucous membranes are normal.  Eyes: Pupils are equal, round, and reactive to light. Conjunctivae and EOM are normal.  Neck: Normal range of motion. Neck supple.  Cardiovascular: Regular rhythm, S1 normal and S2 normal. Exam reveals no gallop and no friction rub.  No murmur heard. Pulmonary/Chest: Effort normal and breath sounds normal. No  respiratory distress. He exhibits no tenderness.  Abdominal: Soft. Normal appearance and bowel sounds are normal. There is no hepatosplenomegaly. There is no tenderness. There is no rebound, no guarding, no tenderness at McBurney's point and negative Murphy's sign. No hernia.  Musculoskeletal: Normal range of motion.  Neurological: He is alert and oriented to person, place, and time. He has normal strength. No cranial nerve deficit or sensory deficit. Coordination normal. GCS eye subscore is 4. GCS verbal subscore is 5. GCS motor subscore is 6.  Skin: Skin is warm, dry and intact. No rash noted. No cyanosis.  Psychiatric: His speech is delayed. He is slowed and withdrawn. He exhibits a depressed mood. He expresses suicidal ideation. He is inattentive.  Nursing note and vitals reviewed.    ED Treatments / Results  Labs (all labs ordered are listed, but only abnormal results are displayed) Labs Reviewed  ACETAMINOPHEN LEVEL - Abnormal; Notable for the following components:      Result Value   Acetaminophen (Tylenol), Serum <10 (*)    All other components within normal limits  RAPID URINE DRUG SCREEN, HOSP PERFORMED - Abnormal; Notable for the following components:   Tetrahydrocannabinol POSITIVE (*)    Barbiturates   (*)    Value: Result not available. Reagent lot number recalled by manufacturer.   All other components within normal limits  COMPREHENSIVE METABOLIC PANEL  ETHANOL  SALICYLATE LEVEL  CBC    EKG None  Radiology No results found.  Procedures Procedures (including critical care time)  Medications Ordered in ED Medications - No data to display   Initial Impression / Assessment and Plan / ED Course  I have reviewed the triage vital signs and the nursing notes.  Pertinent labs & imaging results that were available during my care of the patient were reviewed by me and considered in my medical decision making (see chart for details).     Patient with depression  and suicidal ideation, needs psychiatric evaluation  Final Clinical Impressions(s) / ED Diagnoses   Final diagnoses:  Suicidal ideations    ED Discharge Orders    None       Pollina, Canary Brimhristopher J, MD 11/20/17 [phone]

## 2017-11-20 NOTE — ED Notes (Signed)
TTS placed with Pt.

## 2017-11-20 NOTE — ED Notes (Signed)
Pt belongings put in locker [place]1 [place] and valuables to security,

## 2017-11-20 NOTE — ED Notes (Signed)
Pt states he understands instructions. Home stable with steady gait in company of aunt. All belongings returned.

## 2017-11-20 NOTE — ED Triage Notes (Signed)
Pt reports he is suicidal, plans to jump off an building. Reports he has tried it before.  Pt is not open w/ information concerning what is going on.  Denies HI

## 2018-03-09 ENCOUNTER — Emergency Department (HOSPITAL_COMMUNITY)
Admission: EM | Admit: 2018-03-09 | Discharge: 2018-03-10 | Disposition: A | Discharge status: Home / Self Care | Payer: Medicaid Other | Attending: Emergency Medicine | Admitting: Emergency Medicine

## 2018-03-09 ENCOUNTER — Encounter (HOSPITAL_COMMUNITY): Payer: Self-pay | Admitting: Emergency Medicine

## 2018-03-09 ENCOUNTER — Other Ambulatory Visit: Payer: Self-pay

## 2018-03-09 DIAGNOSIS — F17210 Nicotine dependence, cigarettes, uncomplicated: Secondary | ICD-10-CM | POA: Diagnosis not present

## 2018-03-09 DIAGNOSIS — Z139 Encounter for screening, unspecified: Secondary | ICD-10-CM

## 2018-03-09 DIAGNOSIS — F251 Schizoaffective disorder, depressive type: Secondary | ICD-10-CM | POA: Insufficient documentation

## 2018-03-09 DIAGNOSIS — Z79899 Other long term (current) drug therapy: Secondary | ICD-10-CM | POA: Diagnosis not present

## 2018-03-09 DIAGNOSIS — F909 Attention-deficit hyperactivity disorder, unspecified type: Secondary | ICD-10-CM | POA: Diagnosis not present

## 2018-03-09 DIAGNOSIS — F1210 Cannabis abuse, uncomplicated: Secondary | ICD-10-CM | POA: Diagnosis present

## 2018-03-09 DIAGNOSIS — F1220 Cannabis dependence, uncomplicated: Secondary | ICD-10-CM | POA: Diagnosis present

## 2018-03-09 DIAGNOSIS — F209 Schizophrenia, unspecified: Secondary | ICD-10-CM

## 2018-03-09 DIAGNOSIS — F603 Borderline personality disorder: Secondary | ICD-10-CM | POA: Diagnosis present

## 2018-03-09 DIAGNOSIS — F1994 Other psychoactive substance use, unspecified with psychoactive substance-induced mood disorder: Secondary | ICD-10-CM

## 2018-03-09 LAB — CBC
HCT: 48.9 % (ref 39.0–52.0)
Hemoglobin: 15.3 g/dL (ref 13.0–17.0)
MCH: 26.6 pg (ref 26.0–34.0)
MCHC: 31.3 g/dL (ref 30.0–36.0)
MCV: 85 fL (ref 80.0–100.0)
Platelets: 251 10*3/uL (ref 150–400)
RBC: 5.75 MIL/uL (ref 4.22–5.81)
RDW: 13.2 % (ref 11.5–15.5)
WBC: 5.7 10*3/uL (ref 4.0–10.5)
nRBC: 0 % (ref 0.0–0.2)

## 2018-03-09 LAB — ETHANOL: Alcohol, Ethyl (B): <10 mg/dL (ref <10)

## 2018-03-09 LAB — COMPREHENSIVE METABOLIC PANEL
ALT: 40 U/L (ref 0–44)
AST: 28 U/L (ref 15–41)
Albumin: 4 g/dL (ref 3.5–5.0)
Alkaline Phosphatase: 80 U/L (ref 38–126)
Anion gap: 6 (ref 5–15)
BUN: 7 mg/dL (ref 6–20)
CO2: 29 mmol/L (ref 22–32)
Calcium: 9.4 mg/dL (ref 8.9–10.3)
Chloride: 105 mmol/L (ref 98–111)
Creatinine, Ser: 0.78 mg/dL (ref 0.61–1.24)
GFR calc Af Amer: >60 mL/min (ref >60)
GFR calc non Af Amer: >60 mL/min (ref >60)
Glucose, Bld: 95 mg/dL (ref 70–99)
Potassium: 4 mmol/L (ref 3.5–5.1)
Sodium: 140 mmol/L (ref 135–145)
Total Bilirubin: 0.4 mg/dL (ref 0.3–1.2)
Total Protein: 7.4 g/dL (ref 6.5–8.1)

## 2018-03-09 NOTE — BH Assessment (Signed)
TTS attempted to contact the pt's grandmother Gretta Began using the phone number in Epic [phone]).  There was not an answer nor a voice mail.  TTS will attempt to contact his grandmother at another time for collateral information.

## 2018-03-09 NOTE — ED Notes (Signed)
Pt changed into purple scrubs. Security notifed for wanding. Pt's belongings placed in [facility] locker # , not inventoried at this time.

## 2018-03-09 NOTE — ED Notes (Signed)
Patient's belongings inventoried and placed in locker #3;Valuable to be taken to security-Monique,RN

## 2018-03-09 NOTE — ED Provider Notes (Addendum)
[facility] EMERGENCY DEPARTMENT Provider Note   CSN: [id number] Arrival date & time: 03/09/18  2116     History   Chief Complaint Chief Complaint  Patient presents with  . Psychiatric Evaluation    HPI [name] is a 22 y.o. male.  The history is provided by the patient and medical records.    23 year old male with history of ADHD, anemia, anxiety, bipolar disorder, history of self-mutilation, depression, oppositional defiant disorder, schizophrenia, presenting to the ED for psychiatric evaluation.  Patient reportedly arrived with [employer], however no [employer] staff noted with patient upon his arrival.  Patient thinks maybe his grandmother called but is not sure why.  Patient does reside with his grandmother as his mother is in [place] in a rehab facility.  Patient reports that he feels like he may need some help with "aggression".  He denies any recent events or altercations.  He denies any suicidal ideation.  He does report he attempted to jump off a bridge a few years ago.  No hallucinations.  Does report drinking 2 beers tonight and smoked marijuana yesterday.  Of note, patient was hospitalized at [facility] a month ago.  He does have outpatient team follows up with every few weeks.  Past Medical History:  Diagnosis Date  . ADD (attention deficit disorder)   . 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 Anemia   . Anxiety   . Bipolar disorder (HCC)   . Borderline personality disorder (HCC)   . Central auditory processing disorder   . Deliberate self-cutting   . Depressed   . Eczema   . Headache(784.0)   . Oppositional defiant disorder   . Schizophrenia [facility])     Patient Active Problem List   Diagnosis Date Noted  . Paranoid schizophrenia (HCC)   . Cannabis use disorder, moderate, dependence (HCC) 06/05/2016  . Eating disorder Unspecified 05/22/2016  . Borderline personality disorder (HCC) 05/16/2016  .  Tobacco use disorder 05/15/2016    Past Surgical History:  Procedure Laterality Date  . BACK SURGERY    . FRACTURE SURGERY    . NO PAST SURGERIES    . POSTERIOR LUMBAR FUSION N/A 02/19/2015   Procedure: LATERAL L-2 CORPECTOMY;  Surgeon: Lisbeth Renshaw, MD;  Location: [facility] OR;  Service: Neurosurgery;  Laterality: N/A;  . POSTERIOR LUMBAR FUSION 4 LEVEL  02/19/2015   Procedure: Posterior T-12 - L-4 STABILIZATION OF POSTERIOR LUMBAR;  Surgeon: Lisbeth Renshaw, MD;  Location: [facility] OR;  Service: Neurosurgery;;  . TIBIA IM NAIL INSERTION Left 02/20/2015   Procedure: INTRAMEDULLARY (IM) NAIL LEFT TIBIAL;  Surgeon: [name], MD;  Location: [facility] OR;  Service: Orthopedics;  Laterality: Left;        Home Medications    Prior to Admission medications   Medication Sig Start Date End Date Taking? Authorizing Provider  ARIPiprazole (ABILIFY) 15 MG tablet Take 15 mg by mouth at bedtime.    [provider]  topiramate (TOPAMAX) 25 MG tablet Take 25 mg by mouth 2 (two) times daily.    [provider]    Family History No family history on file.  Social History Social History   Tobacco Use  . Smoking status: Current Every Day Smoker    Packs/day: 2.00    Types: Cigarettes  . Smokeless tobacco: Never Used  . Tobacco comment: 1-3 packs   Substance Use Topics  . Alcohol use: Yes    Alcohol/week: 2.0 standard drinks    Types: 2  Shots of liquor per week    Comment:  weekends some   . Drug use: Yes    Types: Marijuana, Cocaine    Comment: crack & marijuana last used 10-27-16     Allergies   Clozapine; Prolixin [fluphenazine]; and Risperdal [risperidone]   Review of Systems Review of Systems  Psychiatric/Behavioral:       Psych eval  All other systems reviewed and are negative.    Physical Exam Updated Vital Signs BP 109/85 (BP Location: Right Arm)   Pulse 88   Temp 97.9 F (36.6 C) (Oral)   Resp 12   Ht 5\' 8"  (1.727 m)   SpO2 100%   BMI 22.05 kg/m    Physical Exam  Constitutional: He is oriented to person, place, and time. He appears well-developed and well-nourished.  Sleeping, NAD  HENT:  Head: Normocephalic and atraumatic.  Mouth/Throat: Oropharynx is clear and moist.  Eyes: Pupils are equal, round, and reactive to light. Conjunctivae and EOM are normal.  Neck: Normal range of motion.  Cardiovascular: Normal rate, regular rhythm and normal heart sounds.  Pulmonary/Chest: Effort normal and breath sounds normal.  Abdominal: Soft. Bowel sounds are normal.  Musculoskeletal: Normal range of motion.  Neurological: He is alert and oriented to person, place, and time.  Skin: Skin is warm and dry.  Psychiatric: He has a normal mood and affect. He is not actively hallucinating. He expresses no homicidal and no suicidal ideation. He expresses no suicidal plans and no homicidal plans.  Nursing note and vitals reviewed.    ED Treatments / Results  Labs (all labs ordered are listed, but only abnormal results are displayed) Labs Reviewed  COMPREHENSIVE METABOLIC PANEL  ETHANOL  CBC  RAPID URINE DRUG SCREEN, HOSP PERFORMED    EKG None  Radiology No results found.  Procedures Procedures (including critical care time)  Medications Ordered in ED Medications - No data to display   Initial Impression / Assessment and Plan / ED Course  I have reviewed the triage vital signs and the nursing notes.  Pertinent labs & imaging results that were available during my care of the patient were reviewed by me and considered in my medical decision making (see chart for details).  22 year old male here requesting psychiatric evaluation.  After talking with him, he is not able to give me any clear reasoning as to why he came in today.  Reported some anger issues but is very calm and cooperative here, denies recent outbursts or altercations.  He denies any suicidal homicidal ideation.  No apparent hallucinations.  Labs overall reassuring.  TTS  has evaluated, recommends overnight observation and reassessment by psychiatry in the morning.  5:00 AM Call from RN that patient is acting out.  Is cursing at staff, throwing ice and cups at them.  Ativan ordered.  RN did not feel safe giving meds so myself and charge RN have administered 2mg  ativan IM.  Patient continues cursing at staff stating "I don't give a fuck what happens". Repeat AM psych eval pending.  Final Clinical Impressions(s) / ED Diagnoses   Final diagnoses:  Encounter for medical screening examination    ED Discharge Orders    None       Garlon Hatchet, PA-C 03/10/18 0437    Garlon Hatchet, PA-C 03/10/18 1610    Zadie Rhine, MD 03/10/18 [phone]

## 2018-03-09 NOTE — ED Notes (Signed)
TTS in process in F8 at this time-Monique,RN

## 2018-03-09 NOTE — BH Assessment (Addendum)
Tele Assessment Note   Patient Name: [name] MRN: [id number] Referring Physician:  Location of Patient: [facility] ED Location of Provider: Behavioral Health TTS Department  [name] is an 22 y.o. male.  The pt came in by the police.  The pt stated he thinks his grand mother called the police.  He is not sure why she called the police and the pt's grand mother is unable to be contacted at this time.  The pt denies SI, HI, and psychosis currently.  The pt has a history of suicide in 2016 when he jumped off of a bridge.  The pt denies wanting to kill himself now.  The pt stated he returned from [place] 4 days ago, which is where is mother is located and in rehab.  The pt stated he was told to live with his grand mother.  The pt is currently receiving [facility] through [facility].  He stated he seems them about once every other week.  The pt has had several hospitalizations, with the most recent being at [facility] a month ago.  The pt stated he does not have anywhere to go.  He has a history of cutting himself and last cut about 2-3 years ago.  The pt denies HI, but stated he is irritated currently.  The pt denies any current legal issues, history of abuse and hallucinations.  The pt states he is sleeping and eating well.  He stated he has a history of anorexia, but he is eating now.  The pt reported feeling hopeless, having little interest in pleasurable things and having crying spells.  The pt stated he had about 2 beers tonight and his blood alcohol level is 0.  The pt stated he has a history of doing cocaine and ecstasy.  He stated he last did those a month ago.  He last used marijuana yesterday.  His UDS has not been completed at this time.  Pt is dressed in scrubs. He is alert and oriented x4. Pt speaks in a clear tone, at moderate volume and normal pace. Eye contact is good. Pt's mood is depressed. Thought process is coherent and relevant. There is no indication  Pt is currently responding to internal stimuli or experiencing delusional thought content.?Pt was cooperative throughout assessment.    Diagnosis: F25.1 Schizoaffective disorder, Depressive type  Past Medical History:  Past Medical History:  Diagnosis Date  . ADD (attention deficit disorder)   . 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 Anemia   . Anxiety   . Bipolar disorder (HCC)   . Borderline personality disorder (HCC)   . Central auditory processing disorder   . Deliberate self-cutting   . Depressed   . Eczema   . Headache(784.0)   . Oppositional defiant disorder   . Schizophrenia [facility])     Past Surgical History:  Procedure Laterality Date  . BACK SURGERY    . FRACTURE SURGERY    . NO PAST SURGERIES    . POSTERIOR LUMBAR FUSION N/A 02/19/2015   Procedure: LATERAL L-2 CORPECTOMY;  Surgeon: Lisbeth Renshaw, MD;  Location: [facility] OR;  Service: Neurosurgery;  Laterality: N/A;  . POSTERIOR LUMBAR FUSION 4 LEVEL  02/19/2015   Procedure: Posterior T-12 - L-4 STABILIZATION OF POSTERIOR LUMBAR;  Surgeon: Lisbeth Renshaw, MD;  Location: [facility] OR;  Service: Neurosurgery;;  . TIBIA IM NAIL INSERTION Left 02/20/2015   Procedure: INTRAMEDULLARY (IM) NAIL LEFT TIBIAL;  Surgeon: [name], MD;  Location: [facility] OR;  Service: Orthopedics;  Laterality: Left;    Family History: No family history on file.  Social History:  reports that he has been smoking cigarettes. He has been smoking about 2.00 packs per day. He has never used smokeless tobacco. He reports that he drinks about 2.0 standard drinks of alcohol per week. He reports that he has current or past drug history. Drugs: Marijuana and Cocaine.  Additional Social History:  Alcohol / Drug Use Pain Medications: See MAR Prescriptions: See MAR Over the Counter: See MAR History of alcohol / drug use?: Yes Longest period of sobriety (when/how long): unknown Substance #1 Name of Substance 1: marijuana 1 -  Age of First Use: 15 1 - Amount (size/oz): 2-3 blunts 1 - Frequency: 2 times a week 1 - Last Use / Amount: 03/08/2018 Substance #2 Name of Substance 2: cocaine 2 - Age of First Use: 19 2 - Amount (size/oz): gram 2 - Frequency: "whenever I can" 2 - Duration: 3 years 2 - Last Use / Amount: "a month ago" Substance #3 Name of Substance 3: ecstasy 3 - Age of First Use: 22 3 - Amount (size/oz): unknown 3 - Frequency: "whevever I take cocaine" 3 - Duration: one year 3 - Last Use / Amount: "a month ago" Substance #4 Name of Substance 4: alcohol 4 - Age of First Use: 14 4 - Amount (size/oz): "socially" 4 - Frequency: "socially" 4 - Duration: 8 years 4 - Last Use / Amount: today  CIWA: CIWA-Ar BP: 109/85 Pulse Rate: 88 COWS:    Allergies:  Allergies  Allergen Reactions  . Clozapine Hives and Other (See Comments)    Increased blood pressure  . Prolixin [Fluphenazine] Other (See Comments)    Hallucinations  . Risperdal [Risperidone] Other (See Comments)    Unknown    Home Medications:  (Not in a hospital admission)  OB/GYN Status:  No LMP for male patient.  General Assessment Data Location of Assessment: [facility] ED TTS Assessment: In system Is this a Tele or Face-to-Face Assessment?: Face-to-Face Is this an Initial Assessment or a Re-assessment for this encounter?: Initial Assessment Patient Accompanied by:: N/A Language Other than English: No Living Arrangements: Homeless/Shelter What gender do you identify as?: Male Marital status: Single Maiden name: NA Pregnancy Status: No Living Arrangements: Other relatives Can pt return to current living arrangement?: No(uncertain) Admission Status: Voluntary Is patient capable of signing voluntary admission?: No(pt may have a guardian) Referral Source: Self/Family/Friend Insurance type: Medicaid     Crisis Care Plan Living Arrangements: Other relatives Legal Guardian: Mother Name of Psychiatrist: [facility] Name of  Therapist: [facility]  Education Status Is patient currently in school?: No Is the patient employed, unemployed or receiving disability?: Unemployed  Risk to self with the past 6 months Suicidal Ideation: No Has patient been a risk to self within the past 6 months prior to admission? : No Suicidal Intent: No Has patient had any suicidal intent within the past 6 months prior to admission? : No Is patient at risk for suicide?: No Suicidal Plan?: No Has patient had any suicidal plan within the past 6 months prior to admission? : No Access to Means: No What has been your use of drugs/alcohol within the last 12 months?: cocaine, marijuana, and ecstacy Previous Attempts/Gestures: Yes How many times?: 2 Other Self Harm Risks: history of cutting Triggers for Past Attempts: Unpredictable Intentional Self Injurious Behavior: Cutting(history of cutting) Comment - Self Injurious Behavior: history of cutting Family Suicide History: Unknown Recent stressful life event(s):  Other (Comment)(no where to stay) Persecutory voices/beliefs?: No Depression: Yes Depression Symptoms: Despondent, Tearfulness, Feeling worthless/self pity, Loss of interest in usual pleasures Substance abuse history and/or treatment for substance abuse?: Yes Suicide prevention information given to non-admitted patients: Not applicable  Risk to Others within the past 6 months Homicidal Ideation: No Does patient have any lifetime risk of violence toward others beyond the six months prior to admission? : No Thoughts of Harm to Others: No Current Homicidal Intent: No Current Homicidal Plan: No Access to Homicidal Means: No Identified Victim: none History of harm to others?: No Assessment of Violence: None Noted Violent Behavior Description: none  Does patient have access to weapons?: No Criminal Charges Pending?: No Does patient have a court date: No Is patient on probation?: No  Psychosis Hallucinations: None  noted Delusions: None noted  Mental Status Report Appearance/Hygiene: In scrubs, Unremarkable Eye Contact: Good Motor Activity: Freedom of movement, Unremarkable Speech: Logical/coherent, Soft Level of Consciousness: Alert Mood: Depressed Affect: Depressed Anxiety Level: None Thought Processes: Coherent, Relevant Judgement: Partial Orientation: Person, Place, Time, Situation Obsessive Compulsive Thoughts/Behaviors: None  Cognitive Functioning Concentration: Normal Memory: Recent Intact, Remote Intact Is patient IDD: No Insight: Fair Impulse Control: Fair Appetite: Fair Have you had any weight changes? : No Change Sleep: No Change Total Hours of Sleep: 8 Vegetative Symptoms: None  ADLScreening [facility] Assessment Services) Patient's cognitive ability adequate to safely complete daily activities?: Yes Patient able to express need for assistance with ADLs?: Yes Independently performs ADLs?: Yes (appropriate for developmental age)  Prior Inpatient Therapy Prior Inpatient Therapy: Yes Prior Therapy Dates: 01/2018 and multiple Prior Therapy Facilty/Provider(s): [facility], [facility], [facility] Reason for Treatment: SI  Prior Outpatient Therapy Prior Outpatient Therapy: Yes Prior Therapy Dates: current Prior Therapy Facilty/Provider(s): [facility] Reason for Treatment: Schizoaffective disorder Does patient have an ACCT team?: Yes Does patient have Intensive In-House Services?  : No Does patient have [facility] services? : No Does patient have P4CC services?: No  ADL Screening (condition at time of admission) Patient's cognitive ability adequate to safely complete daily activities?: Yes Patient able to express need for assistance with ADLs?: Yes Independently performs ADLs?: Yes (appropriate for developmental age)       Abuse/Neglect Assessment (Assessment to be complete while patient is alone) Abuse/Neglect Assessment Can Be Completed: Yes Physical Abuse:  Denies Verbal Abuse: Denies Sexual Abuse: Denies Exploitation of patient/patient's resources: Denies Self-Neglect: Denies Values / Beliefs Cultural Requests During Hospitalization: None Spiritual Requests During Hospitalization: None Consults Spiritual Care Consult Needed: No Social Work Consult Needed: No Merchant navy officer (For Healthcare) Does Patient Have a Medical Advance Directive?: No          Disposition:  Disposition Initial Assessment Completed for this Encounter: Yes   PA Donell Sievert recommends the pt be observed overnight for safety and stabilization.  The pt is to be reassessed by psychiatry in the morning.  RN Gabriel Rung was made aware of the recommendation.  This service was provided via telemedicine using a 2-way, interactive audio and video technology.  Names of all persons participating in this telemedicine service and their role in this encounter. Name: Cleveland Yarbro Role: Pt  Name: Riley Churches Role: TTS  Name:  Role:   Name:  Role:     Ottis Stain 03/09/2018 10:44 PM

## 2018-03-09 NOTE — ED Triage Notes (Signed)
Pt reports he is here for a psych evaluation. Pt reports the police just dropped him off. Pt reports his grandma called the cops and doesn't know why. Pt denies SI. When asked if pt was HI, pt paused and said he doesn't want to hurt anybody but that he wants to relieve his stress and is frustrated. Reports that he needs to control his temper. Reports he has nowhere to go. Denies hallucinations. Denies pain. States he had a few cups of beer. Hx of psych - depression, ADD, anxiety, bipolar, and schizophrenia.

## 2018-03-10 ENCOUNTER — Encounter (HOSPITAL_COMMUNITY): Payer: Self-pay | Admitting: Registered Nurse

## 2018-03-10 DIAGNOSIS — F603 Borderline personality disorder: Secondary | ICD-10-CM | POA: Diagnosis not present

## 2018-03-10 DIAGNOSIS — F1220 Cannabis dependence, uncomplicated: Secondary | ICD-10-CM | POA: Diagnosis not present

## 2018-03-10 DIAGNOSIS — F1994 Other psychoactive substance use, unspecified with psychoactive substance-induced mood disorder: Secondary | ICD-10-CM

## 2018-03-10 LAB — RAPID URINE DRUG SCREEN, HOSP PERFORMED
Amphetamines: NOT DETECTED
Barbiturates: NOT DETECTED
Benzodiazepines: POSITIVE — AB
Cocaine: NOT DETECTED
Opiates: NOT DETECTED
Tetrahydrocannabinol: POSITIVE — AB

## 2018-03-10 MED ORDER — OLANZAPINE 5 MG PO TBDP: 10.0000 mg | ORAL_TABLET | Freq: Once | ORAL | Status: DC

## 2018-03-10 MED ORDER — LORAZEPAM 2 MG/ML IJ SOLN
1.0000 mg | Freq: Once | INTRAMUSCULAR | Status: DC
  Filled 2018-03-10: qty 1

## 2018-03-10 MED ORDER — LORAZEPAM 2 MG/ML IJ SOLN
2.0000 mg | Freq: Once | INTRAMUSCULAR | Status: AC
  Administered 2018-03-10: 2 mg via INTRAMUSCULAR
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
## 2018-03-10 NOTE — ED Notes (Signed)
Patient is progressing to become violent and rude towards staff. Slamming doors, throwing stuff, cursing at staff and calling them out their name.

## 2018-03-10 NOTE — ED Notes (Signed)
Pt standing in hallway - refusing to return to his room. Aware of delay w/ACT. RN contacted as pt requested - Chelsea to contact [facility] then return call to advise.

## 2018-03-10 NOTE — ED Notes (Signed)
Tasia Catchings, ACT, called and advised he will come pick up pt. Advised they have made an APS report d/t pt is in [place] w/o Legal Guardian who is in rehab in [place]. Pt standing in doorway of room - aware of tx plan. Security and [employer] continue to stand by.

## 2018-03-10 NOTE — ED Notes (Addendum)
Dr Criss Alvine and Denice Bors, NP, [facility], are aware of pt's behavior. Dr Criss Alvine aware Denice Bors, NP, [facility], is recommending to d/c to home. Pt refusing for VS to be taken.

## 2018-03-10 NOTE — ED Notes (Signed)
PA at bedside.
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
## 2018-03-10 NOTE — ED Notes (Signed)
Patient was given Cookies and Drink for Snack. 

## 2018-03-10 NOTE — ED Notes (Signed)
Patient has stood in there door way talking to staff;patient asked if he could get more shots, RN advised that he will need to be alert when TTS is ready to reassess this AM; pt asked if he has to do something to get more medication, patient advised that is not the best choice; patient asked if he could get another shot because he needs his fix; patient asked for "Blood substitute". When staff inquired what that was and patient states he will figure out how to get it; patient asked to take a shower at this time.Patient inquired from Security if he was under IVC; Security and [employer] remain in unit for safety-Monique,RN

## 2018-03-10 NOTE — ED Notes (Signed)
Patient has ripped off his arm band-Monique,RN

## 2018-03-10 NOTE — ED Notes (Signed)
When RN asked patient why he became aggressive all of a sudden; patient states " I have not had my fixed" When asked what he was speaking of patient states "I haven't had my cocaine, which calms me down"-Monique,RN

## 2018-03-10 NOTE — ED Notes (Signed)
Telepsych being performed. 

## 2018-03-10 NOTE — ED Notes (Signed)
Sitting up on side of bed

## 2018-03-10 NOTE — Consult Note (Addendum)
Telepsych Consultation   Reason for Consult:  Psychiatric evaluation Referring Physician:  Ripley Fraise, MD Location of Patient: [facility] Location of Provider: [facility]  Patient Identification: [name] MRN:  [id number] Principal Diagnosis: Substance induced mood disorder [facility]) Diagnosis:   Patient Active Problem List   Diagnosis Date Noted  . Substance induced mood disorder ([place]) [F19.94] 03/10/2018  . Paranoid schizophrenia ([place]) [F20.0]   . Cannabis use disorder, moderate, dependence ([place]) [F12.20] 06/05/2016  . Eating disorder Unspecified [F50.9] 05/22/2016  . Borderline personality disorder ([place]) [F60.3] 05/16/2016  . Tobacco use disorder [F17.200] 05/15/2016    Total Time spent with patient: 30 minutes  Subjective:   [name] is a 22 y.o. male patient presented to [facility] via law enforcement   Patient he is not sure why he was brought to hospital; but thinks his grandmother may have called police  HPI:  [name], 22 y.o., male patient seen via telepsych by this provider; chart reviewed and consulted with Dr. Dwyane Dee on 03/10/18.  On evaluation [name] reports that he doesn't know why he was brought to the hospital by the police.  Patient denies suicidal/self-harm/homicidal ideation, psychosis, and paranoia.  Prior to the start of tele assessment RN informing patient that he has to participate patient yelling at RN "Get the fuck out."  Patient reports that he lives with his mother; but  "she is in rehab right now" and he can't be in the house while she is not there so he is homeless until she gets back "for bout a week"  Patient states that he does have a prior history of psychiatric hospitalization and outpatient psychiatric services.  States that he has an ACT team with [facility] and he has a visit "every other week".  Patient also states that he is unemployed; when asked how he supported himself he  responded "that is irrelevant"  Patient states that he also does illicit drugs "ecstasy and cocaine" but unsure of his last use.  Patient irritated related to being in hospital; and not understanding why he is here; ready to go home.    During evaluation [name] is alert/oriented x 4; cooperative but irritable; He does not appear to be responding to internal/external stimuli or delusional thoughts.  Patient denies suicidal/self-harm/homicidal ideation, psychosis, and paranoia.  Patient answered most question with yes or no; and minor elaboration; agitation if asked to explain something.  Patient refusing to take medications and has had several episodes of outburst while in ED yelling, cursing and throwing things.        Past Psychiatric History: Cannabis use disorder, Borderline personality disorder, eating disorder, paranoid schizophrenia  Risk to Self: Suicidal Ideation: No Suicidal Intent: No Is patient at risk for suicide?: No Suicidal Plan?: No Access to Means: No What has been your use of drugs/alcohol within the last 12 months?: cocaine, marijuana, and ecstacy How many times?: 2 Other Self Harm Risks: history of cutting Triggers for Past Attempts: Unpredictable Intentional Self Injurious Behavior: Cutting(history of cutting) Comment - Self Injurious Behavior: history of cutting Risk to Others: Homicidal Ideation: No Thoughts of Harm to Others: No Current Homicidal Intent: No Current Homicidal Plan: No Access to Homicidal Means: No Identified Victim: none History of harm to others?: No Assessment of Violence: None Noted Violent Behavior Description: none  Does patient have access to weapons?: No Criminal Charges Pending?: No Does patient have a court date: No Prior Inpatient Therapy: Prior Inpatient Therapy: Yes Prior Therapy  Dates: 01/2018 and multiple Prior Therapy Facilty/Provider(s): [facility], [facility] Reason for Treatment: SI Prior  Outpatient Therapy: Prior Outpatient Therapy: Yes Prior Therapy Dates: current Prior Therapy Facilty/Provider(s): [facility] Reason for Treatment: Schizoaffective disorder Does patient have an ACCT team?: Yes Does patient have Intensive In-House Services?  : No Does patient have [facility] services? : No Does patient have P4CC services?: No  Past Medical History:  Past Medical History:  Diagnosis Date  . ADD (attention deficit disorder)   . 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 Anemia   . Anxiety   . Bipolar disorder ([place])   . Borderline personality disorder ([place])   . Central auditory processing disorder   . Deliberate self-cutting   . Depressed   . Eczema   . Headache(784.0)   . Oppositional defiant disorder   . Schizophrenia [facility])     Past Surgical History:  Procedure Laterality Date  . BACK SURGERY    . FRACTURE SURGERY    . NO PAST SURGERIES    . POSTERIOR LUMBAR FUSION N/A 02/19/2015   Procedure: LATERAL L-2 CORPECTOMY;  Surgeon: [name], MD;  Location: [place];  Service: Neurosurgery;  Laterality: N/A;  . POSTERIOR LUMBAR FUSION 4 LEVEL  02/19/2015   Procedure: Posterior T-12 - L-4 STABILIZATION OF POSTERIOR LUMBAR;  Surgeon: [name], MD;  Location: [place];  Service: Neurosurgery;;  . TIBIA IM NAIL INSERTION Left 02/20/2015   Procedure: INTRAMEDULLARY (IM) NAIL LEFT TIBIAL;  Surgeon: Rod Can, MD;  Location: [place];  Service: Orthopedics;  Laterality: Left;   Family History: History reviewed. No pertinent family history. Family Psychiatric  History: Mother substance Social History:  Social History   Substance and Sexual Activity  Alcohol Use Yes  . Alcohol/week: 2.0 standard drinks  . Types: 2 Shots of liquor per week   Comment:  weekends some      Social History   Substance and Sexual Activity  Drug Use Yes  . Types: Marijuana, Cocaine   Comment: crack & marijuana last used 10-27-16    Social  History   Socioeconomic History  . Marital status: Single    Spouse name: Not on file  . Number of children: Not on file  . Years of education: Not on file  . Highest education level: Not on file  Occupational History  . Not on file  Social Needs  . Financial resource strain: Not on file  . Food insecurity:    Worry: Not on file    Inability: Not on file  . Transportation needs:    Medical: Not on file    Non-medical: Not on file  Tobacco Use  . Smoking status: Current Every Day Smoker    Packs/day: 2.00    Types: Cigarettes  . Smokeless tobacco: Never Used  . Tobacco comment: 1-3 packs   Substance and Sexual Activity  . Alcohol use: Yes    Alcohol/week: 2.0 standard drinks    Types: 2 Shots of liquor per week    Comment:  weekends some   . Drug use: Yes    Types: Marijuana, Cocaine    Comment: crack & marijuana last used 10-27-16  . Sexual activity: Yes    Birth control/protection: None    Comment: pt reluctant to answer questions and frequently stated " I dont know"  Lifestyle  . Physical activity:    Days per week: Not on file    Minutes per session: Not on file  .  Stress: Not on file  Relationships  . Social connections:    Talks on phone: Not on file    Gets together: Not on file    Attends religious service: Not on file    Active member of club or organization: Not on file    Attends meetings of clubs or organizations: Not on file    Relationship status: Not on file  Other Topics Concern  . Not on file  Social History Narrative   ** Merged History Encounter **       Additional Social History:    Allergies:   Allergies  Allergen Reactions  . Clozapine Hives and Other (See Comments)    Increased blood pressure  . Prolixin [Fluphenazine] Other (See Comments)    Hallucinations  . Risperdal [Risperidone] Other (See Comments)    Unknown    Labs:  Results for orders placed or performed during the hospital encounter of 03/09/18 (from the past 48  hour(s))  Comprehensive metabolic panel     Status: None   Collection Time: 03/09/18  9:48 PM  Result Value Ref Range   Sodium 140 135 - 145 mmol/L   Potassium 4.0 3.5 - 5.1 mmol/L   Chloride 105 98 - 111 mmol/L   CO2 29 22 - 32 mmol/L   Glucose, Bld 95 70 - 99 mg/dL   BUN 7 6 - 20 mg/dL   Creatinine, Ser 0.78 0.61 - 1.24 mg/dL   Calcium 9.4 8.9 - 10.3 mg/dL   Total Protein 7.4 6.5 - 8.1 g/dL   Albumin 4.0 3.5 - 5.0 g/dL   AST 28 15 - 41 U/L   ALT 40 0 - 44 U/L   Alkaline Phosphatase 80 38 - 126 U/L   Total Bilirubin 0.4 0.3 - 1.2 mg/dL   GFR calc non Af Amer >60 >60 mL/min   GFR calc Af Amer >60 >60 mL/min    Comment: (NOTE) The eGFR has been calculated using the CKD EPI equation. This calculation has not been validated in all clinical situations. eGFR's persistently <60 mL/min signify possible Chronic Kidney Disease.    Anion gap 6 5 - 15    Comment: Performed at [facility] [address] [place], [place] [address]  Ethanol     Status: None   Collection Time: 03/09/18  9:48 PM  Result Value Ref Range   Alcohol, Ethyl (B) <10 <10 mg/dL    Comment: (NOTE) Lowest detectable limit for serum alcohol is 10 mg/dL. For medical purposes only. Performed at [facility], [place] [address], [place] [address]   cbc     Status: None   Collection Time: 03/09/18  9:48 PM  Result Value Ref Range   WBC 5.7 4.0 - 10.5 K/uL   RBC 5.75 4.22 - 5.81 MIL/uL   Hemoglobin 15.3 13.0 - 17.0 g/dL   HCT 48.9 39.0 - 52.0 %   MCV 85.0 80.0 - 100.0 fL   MCH 26.6 26.0 - 34.0 pg   MCHC 31.3 30.0 - 36.0 g/dL   RDW 13.2 11.5 - 15.5 %   Platelets 251 150 - 400 K/uL   nRBC 0.0 0.0 - 0.2 %    Comment: Performed at [facility], [place] [address], [place], [place] [address]    Medications:  Current Facility-Administered Medications  Medication Dose Route Frequency Provider Last Rate Last Dose  . OLANZapine zydis (ZYPREXA) disintegrating tablet 10 mg  10 mg Oral Once  Sherwood Gambler, MD  Current Outpatient Medications  Medication Sig Dispense Refill  . ARIPiprazole (ABILIFY) 15 MG tablet Take 15 mg by mouth at bedtime.    . topiramate (TOPAMAX) 25 MG tablet Take 25 mg by mouth 2 (two) times daily.      Musculoskeletal: Strength & Muscle Tone: within normal limits Gait & Station: normal Patient leans: N/A  Psychiatric Specialty Exam: Physical Exam  Nursing note and vitals reviewed. Constitutional: He is oriented to person, place, and time. No distress.  Neck: Normal range of motion.  Respiratory: Effort normal.  Musculoskeletal: Normal range of motion.  Neurological: He is alert and oriented to person, place, and time.  Psychiatric: His speech is normal. He is agitated. Thought content is not paranoid. Cognition and memory are normal. He expresses impulsivity. He expresses suicidal ideation. He expresses no homicidal ideation.    Review of Systems  Constitutional: Negative.   Gastrointestinal:       Reports eating without difficulty  Psychiatric/Behavioral: Positive for substance abuse. Depression: Denies. Hallucinations: Denies. Memory loss: Denies. Suicidal ideas: Denies. Nervous/anxious: Denies. Insomnia: Denies; states sleeping without difficulty.   All other systems reviewed and are negative.   Blood pressure 109/85, pulse 88, temperature 97.9 F (36.6 C), temperature source Oral, resp. rate 12, height 5' 8"  (1.727 m), SpO2 100 %.Body mass index is 22.05 kg/m.  General Appearance: Casual  Eye Contact:  Good  Speech:  Clear and Coherent and Normal Rate  Volume:  Normal  Mood:  Irritable  Affect:  Congruent  Thought Process:  Coherent  Orientation:  Full (Time, Place, and Person)  Thought Content:  Logical  Suicidal Thoughts:  No  Homicidal Thoughts:  No  Memory:  Immediate;   Good Recent;   Good Remote;   Good  Judgement:  Intact  Insight:  Lacking  Psychomotor Activity:  Normal  Concentration:  Concentration: Good and  Attention Span: Good  Recall:  Good  Fund of Knowledge:  Good  Language:  Good  Akathisia:  No  Handed:  Right  AIMS (if indicated):     Assets:  Communication Skills Social Support  ADL's:  Intact  Cognition:  WNL  Sleep:        Treatment Plan Summary: Plan Follow up with ACT team  Disposition: No evidence of imminent risk to self or others at present.   Patient does not meet criteria for psychiatric inpatient admission. Supportive therapy provided about ongoing stressors. Discussed crisis plan, support from social network, calling 911, coming to the Emergency Department, and calling Suicide Hotline.  This service was provided via telemedicine using a 2-way, interactive audio and video technology.  Names of all persons participating in this telemedicine service and their role in this encounter. Name: Earleen Newport, NP Role: Tele psych assessment  Name: Dr. Dwyane Dee Role: Psychiatrist  Name: [name] Role: Patient   Name: Dr. Regenia Skeeter Role: Informed of above recommendation and disposition    Shuvon Rankin, NP 03/10/2018 12:24 PM
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
## 2018-03-10 NOTE — ED Notes (Addendum)
Patient is standing in door way with an aggressive stance just staring at staff at this time; [employer] and security present-Monique,RN

## 2018-03-10 NOTE — ED Provider Notes (Signed)
7:48 AM Patient has been standing in door and sometimes throwing things. He is currently waiting on psychiatry eval this AM. He doesn't talk much to me. He denies SI/HI. I will try zyprexa po to see if this calms him down some. Not sure he really needs to be IVC'd at this time. Will continue to monitor.  12:34 PM Psychiatry has cleared patient.  This appears to be similar to his baseline.  He is not suicidal, homicidal, and while he is irritable and does not talk much, he does not seem altered.  His labs were reassuring.  His vitals are unremarkable.  I do not think there is any reason IVC patient and he wants to go home.  Discharged home with return precautions.   Pricilla Loveless, MD 03/10/18 [phone]
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
## 2018-03-10 NOTE — ED Notes (Signed)
EDP and Charge RN at bedside to administer Medication due to patient being aggressive with staff; Bedside table and chairs has been removed from the room for safety-Monique,RN
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
## 2018-03-10 NOTE — Progress Notes (Addendum)
Note written in error.

## 2018-03-10 NOTE — ED Notes (Signed)
Staff noticed patient was playing with cords for the bed; patient was asked to not play with cords; RN felt uneasy about how patient was playing with cords; RN attempted to placed patient on stretcher for patient safety; patient refused to get off the bed and states "I'm not going to make it that easy for you". Security called back to unit for stand by but unable to assist due to patient still in Voluntary status; Patient is currently sitting up in bed; RN has unplugged the bed and wrapped cords at the base of bed; Staff will continue to monitor patient for suspious behaviors-Monique,RN

## 2018-03-10 NOTE — ED Notes (Signed)
Regular Diet was ordered for Lunch. 
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
## 2018-03-10 NOTE — ED Notes (Signed)
Spoke w/pt's ACT [employer]) - [phone] - [place]. Advised she will have someone call us back.

## 2018-03-10 NOTE — ED Notes (Signed)
Pt threw table up against door in room. Table removed from room. Pt refusing to talk w/staff. Pt sitting on side of bed. Security and [employer] at bedside.

## 2018-03-10 NOTE — ED Notes (Signed)
Pt eating breakfast. Pt refusing po Zyprexa x 3 - states he does not need it. States "I don't take medication. It's against my religious beliefs". States he has an Teacher, adult education person through [employer]. States if he has to stay, he would prefer to go to [facility]. Pt had been standing in doorway of room. Pt moved to chair - applied lotion to his legs himself while talking w/RN. Pt then asked for lights to be turned out in room and laid down in bed. Security and [employer] standing by d/t pt's behavior.
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
## 2018-03-11 ENCOUNTER — Encounter (HOSPITAL_COMMUNITY): Payer: Self-pay

## 2018-03-11 ENCOUNTER — Other Ambulatory Visit: Payer: Self-pay

## 2018-03-11 ENCOUNTER — Emergency Department (HOSPITAL_COMMUNITY)
Admission: EM | Admit: 2018-03-11 | Discharge: 2018-03-11 | Disposition: A | Discharge status: Home / Self Care | Payer: Medicaid Other | Attending: Emergency Medicine | Admitting: Emergency Medicine

## 2018-03-11 DIAGNOSIS — F209 Schizophrenia, unspecified: Secondary | ICD-10-CM | POA: Insufficient documentation

## 2018-03-11 DIAGNOSIS — F17210 Nicotine dependence, cigarettes, uncomplicated: Secondary | ICD-10-CM | POA: Diagnosis not present

## 2018-03-11 DIAGNOSIS — R45850 Homicidal ideations: Secondary | ICD-10-CM | POA: Diagnosis not present

## 2018-03-11 DIAGNOSIS — R4689 Other symptoms and signs involving appearance and behavior: Secondary | ICD-10-CM

## 2018-03-11 DIAGNOSIS — F909 Attention-deficit hyperactivity disorder, unspecified type: Secondary | ICD-10-CM | POA: Diagnosis not present

## 2018-03-11 DIAGNOSIS — F319 Bipolar disorder, unspecified: Secondary | ICD-10-CM | POA: Insufficient documentation

## 2018-03-11 DIAGNOSIS — R4589 Other symptoms and signs involving emotional state: Secondary | ICD-10-CM

## 2018-03-11 LAB — COMPREHENSIVE METABOLIC PANEL
ALT: 33 U/L (ref 0–44)
AST: 29 U/L (ref 15–41)
Albumin: 3.8 g/dL (ref 3.5–5.0)
Alkaline Phosphatase: 80 U/L (ref 38–126)
Anion gap: 8 (ref 5–15)
BUN: 7 mg/dL (ref 6–20)
CO2: 30 mmol/L (ref 22–32)
Calcium: 9.7 mg/dL (ref 8.9–10.3)
Chloride: 102 mmol/L (ref 98–111)
Creatinine, Ser: 0.7 mg/dL (ref 0.61–1.24)
GFR calc Af Amer: >60 mL/min (ref >60)
GFR calc non Af Amer: >60 mL/min (ref >60)
Glucose, Bld: 98 mg/dL (ref 70–99)
Potassium: 3.8 mmol/L (ref 3.5–5.1)
Sodium: 140 mmol/L (ref 135–145)
Total Bilirubin: 0.4 mg/dL (ref 0.3–1.2)
Total Protein: 7.3 g/dL (ref 6.5–8.1)

## 2018-03-11 LAB — ACETAMINOPHEN LEVEL: Acetaminophen (Tylenol), Serum: <10 ug/mL — ABNORMAL LOW (ref 10–30)

## 2018-03-11 LAB — SALICYLATE LEVEL: Salicylate Lvl: <7 mg/dL (ref 2.8–30.0)

## 2018-03-11 LAB — CBC
HCT: 48.3 % (ref 39.0–52.0)
Hemoglobin: 14.9 g/dL (ref 13.0–17.0)
MCH: 26 pg (ref 26.0–34.0)
MCHC: 30.8 g/dL (ref 30.0–36.0)
MCV: 84.1 fL (ref 80.0–100.0)
Platelets: 345 10*3/uL (ref 150–400)
RBC: 5.74 MIL/uL (ref 4.22–5.81)
RDW: 13.3 % (ref 11.5–15.5)
WBC: 5.4 10*3/uL (ref 4.0–10.5)
nRBC: 0 % (ref 0.0–0.2)

## 2018-03-11 LAB — ETHANOL: Alcohol, Ethyl (B): <10 mg/dL (ref <10)

## 2018-03-11 MED ORDER — ARIPIPRAZOLE 5 MG PO TABS
15.0000 mg | ORAL_TABLET | Freq: Every day | ORAL | Status: DC
  Filled 2018-03-11: qty 1

## 2018-03-11 MED ORDER — TOPIRAMATE 25 MG PO TABS
25.0000 mg | ORAL_TABLET | Freq: Two times a day (BID) | ORAL | Status: DC
  Administered 2018-03-11: 25 mg via ORAL
  Filled 2018-03-11: qty 1

## 2018-03-11 NOTE — ED Notes (Signed)
Pt refused Abilify stating reason as "it causes weight gain".

## 2018-03-11 NOTE — ED Notes (Signed)
All of patient's belongings inventoried, 2 bags placed in locker 5, valuables with security. Pt signed unit rules/behavior expectations, copy given to patient and original placed in chart.
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
## 2018-03-11 NOTE — Progress Notes (Addendum)
CSW spoke to Darlin Priestly, Case Worker for [facility], and advised that patient is refusing to go with her.  Ms. Jerolyn Center asked that we delay d/cing patient until she has had a chance to come and talk to him to let him know that she has gotten an appointment for an assessment at [facility] for tomorrow between 9:00-9:45 AM.  CSW called and notified [facility] ED and asked them to keep patient until Ms. [name] shows up.  Timmothy Euler. Kaylyn Lim, MSW, LCSWA Disposition Clinical Social Work [phone] (cell) [phone] (office)

## 2018-03-11 NOTE — ED Provider Notes (Signed)
Cleared from a psych standpoint.  Will need social work regarding his living situation.  Guardian will also have to be contacted vs aps   Linwood Dibbles, MD 03/11/18 1047

## 2018-03-11 NOTE — ED Notes (Signed)
Staffing called for sitter and wanded by security

## 2018-03-11 NOTE — Discharge Instructions (Addendum)
Follow up with the behavioral health team as instructed

## 2018-03-11 NOTE — ED Provider Notes (Signed)
Pt cleared by psychiatry.  Stable for discharge.  Pt discharged with [facility] case worked taking pt to [facility] because his mother is getting treatment and not able to care for him.   Linwood Dibbles, MD 03/11/18 1346

## 2018-03-11 NOTE — ED Notes (Signed)
Pt stated to this RN "I just want to die." States he has no goals in life, doesn't think about the future and thinks when he gets tired of this he plans to "end it all." States he has an ACT team but "they aren't doing anything." Can't live with his grandmother because she doesn't want anyone at the house. Stated to be both of his grandparents are "drug addicts". Doesn't take medications because he doesn't like them. Occasionally uses cocaine. Last use 1 month ago. Calm, cooperative. Laughs inappropriately at times.

## 2018-03-11 NOTE — ED Provider Notes (Signed)
[facility] EMERGENCY DEPARTMENT Provider Note   CSN: [id number] Arrival date & time: 03/11/18  8413     History   Chief Complaint Chief Complaint  Patient presents with  . Homicidal    HPI Stephens Shreve is a 22 y.o. male.  Patient is a 22 year old male with history of bipolar disorder, ADHD, ODD presenting for evaluation of homicidal ideation.  He was seen earlier yesterday with similar complaints, then discharged.  He returns stating that he is having thoughts of harming other people.  He does not describe any particular individual.  He also feels depressed and suicidal but denies plan.  He admits to drug use including cocaine, marijuana, and alcohol.  The history is provided by the patient.    Past Medical History:  Diagnosis Date  . ADD (attention deficit disorder)   . 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 Anemia   . Anxiety   . Bipolar disorder (HCC)   . Borderline personality disorder (HCC)   . Central auditory processing disorder   . Deliberate self-cutting   . Depressed   . Eczema   . Headache(784.0)   . Oppositional defiant disorder   . Schizophrenia [facility])     Patient Active Problem List   Diagnosis Date Noted  . Substance induced mood disorder (HCC) 03/10/2018  . Paranoid schizophrenia (HCC)   . Cannabis use disorder, moderate, dependence (HCC) 06/05/2016  . Eating disorder Unspecified 05/22/2016  . Borderline personality disorder (HCC) 05/16/2016  . Tobacco use disorder 05/15/2016    Past Surgical History:  Procedure Laterality Date  . BACK SURGERY    . FRACTURE SURGERY    . NO PAST SURGERIES    . POSTERIOR LUMBAR FUSION N/A 02/19/2015   Procedure: LATERAL L-2 CORPECTOMY;  Surgeon: Lisbeth Renshaw, MD;  Location: [facility] OR;  Service: Neurosurgery;  Laterality: N/A;  . POSTERIOR LUMBAR FUSION 4 LEVEL  02/19/2015   Procedure: Posterior T-12 - L-4 STABILIZATION OF POSTERIOR LUMBAR;   Surgeon: Lisbeth Renshaw, MD;  Location: [facility] OR;  Service: Neurosurgery;;  . TIBIA IM NAIL INSERTION Left 02/20/2015   Procedure: INTRAMEDULLARY (IM) NAIL LEFT TIBIAL;  Surgeon: [name], MD;  Location: [facility] OR;  Service: Orthopedics;  Laterality: Left;        Home Medications    Prior to Admission medications   Medication Sig Start Date End Date Taking? Authorizing Provider  ARIPiprazole (ABILIFY) 15 MG tablet Take 15 mg by mouth at bedtime.    [provider]  topiramate (TOPAMAX) 25 MG tablet Take 25 mg by mouth 2 (two) times daily.    [provider]    Family History No family history on file.  Social History Social History   Tobacco Use  . Smoking status: Current Every Day Smoker    Packs/day: 2.00    Types: Cigarettes  . Smokeless tobacco: Never Used  . Tobacco comment: 1-3 packs   Substance Use Topics  . Alcohol use: Yes    Alcohol/week: 2.0 standard drinks    Types: 2 Shots of liquor per week    Comment:  weekends some   . Drug use: Yes    Types: Marijuana, Cocaine    Comment: crack & marijuana last used 10-27-16     Allergies   Clozapine; Prolixin [fluphenazine]; and Risperdal [risperidone]   Review of Systems Review of Systems  All other systems reviewed and are negative.    Physical Exam Updated Vital Signs BP 103/61   Pulse  77   Temp (!) 97.5 F (36.4 C) (Oral)   Resp 18   SpO2 98%   Physical Exam  Constitutional: He is oriented to person, place, and time. He appears well-developed and well-nourished. No distress.  HENT:  Head: Normocephalic and atraumatic.  Mouth/Throat: Oropharynx is clear and moist.  Neck: Normal range of motion. Neck supple.  Cardiovascular: Normal rate and regular rhythm. Exam reveals no friction rub.  No murmur heard. Pulmonary/Chest: Effort normal and breath sounds normal. No respiratory distress. He has no wheezes. He has no rales.  Abdominal: Soft. Bowel sounds are normal. He exhibits no  distension. There is no tenderness.  Musculoskeletal: Normal range of motion. He exhibits no edema.  Neurological: He is alert and oriented to person, place, and time. Coordination normal.  Skin: Skin is warm and dry. He is not diaphoretic.  Psychiatric: He has a normal mood and affect. His speech is normal. Judgment normal. He is withdrawn. Cognition and memory are normal. He expresses homicidal ideation. He expresses no homicidal plans.  Nursing note and vitals reviewed.    ED Treatments / Results  Labs (all labs ordered are listed, but only abnormal results are displayed) Labs Reviewed  ACETAMINOPHEN LEVEL - Abnormal; Notable for the following components:      Result Value   Acetaminophen (Tylenol), Serum <10 (*)    All other components within normal limits  COMPREHENSIVE METABOLIC PANEL  ETHANOL  SALICYLATE LEVEL  CBC  RAPID URINE DRUG SCREEN, HOSP PERFORMED    EKG None  Radiology No results found.  Procedures Procedures (including critical care time)  Medications Ordered in ED Medications - No data to display   Initial Impression / Assessment and Plan / ED Course  I have reviewed the triage vital signs and the nursing notes.  Pertinent labs & imaging results that were available during my care of the patient were reviewed by me and considered in my medical decision making (see chart for details).  Patient will be evaluated by TTS who will determine the final disposition.  Final Clinical Impressions(s) / ED Diagnoses   Final diagnoses:  None    ED Discharge Orders    None       Geoffery Lyons, MD 03/11/18 2302

## 2018-03-11 NOTE — Consult Note (Signed)
  Tele psych Assessment   [name], 22 y.o., male patient seen via tele psych by TTS and this provider; chart reviewed and consulted with Dr. Lucianne Muss on 03/11/18.  On evaluation [name] reports he doesn't know why he came back to the hospital.  Patient denies suicidal/self-harm/homicidal ideation, psychosis, and paranoia.  Patient has stated earlier that he has no where to go.  During tele psych assessment 02/28/18 patient stated that his mother (who is also his guardian and payee) was in rehab and would be there for a week and that he was unable to stay in her home when she was not there.   During evaluation [name] is laying in bed on his side; alert/oriented x 3; calm/cooperative; Patient does not appear to be responding to internal/external stimuli or delusional thoughts; He denied suicidal/self-harm/homicidal ideation, psychosis, and paranoia.  Patient psychiatrically cleared.  Patient keeps returning to the hospital related to no where to go or stay while his mother is away; not related to being suicidal/homicidal, psychotic, or paranoid.  While in hospital recommend restarting home medications so that patient will not be off of his medications until DSS (adult protective services) wants to do with patient until mother returns.          For detailed note see TTS tele assessment note  While in ED recommendation:   Restart home medications Abilify and Topamax.  Orders have been entered.  To prevent patient from missing any doses of medication.  Recommendations:  Once discharged: Follow up with ACT team ([facility]); Adult protective services to be contacted related to patient's guardian unavailable and unable to contact.    Disposition:  Patient is psychiatrically cleared No evidence of imminent risk to self or others at present.   Patient does not meet criteria for psychiatric inpatient admission. Supportive therapy provided about  ongoing stressors. Refer to IOP. Discussed crisis plan, support from social network, calling 911, coming to the Emergency Department, and calling Suicide Hotline.   Spoke with Dr. Lynelle Doctor informed of above recommendation and disposition  Assunta Found, NP
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
## 2018-03-12 ENCOUNTER — Encounter (HOSPITAL_COMMUNITY): Payer: Self-pay | Admitting: Emergency Medicine

## 2018-03-12 ENCOUNTER — Other Ambulatory Visit: Payer: Self-pay

## 2018-03-12 ENCOUNTER — Emergency Department (HOSPITAL_COMMUNITY)
Admission: EM | Admit: 2018-03-12 | Discharge: 2018-03-12 | Disposition: A | Discharge status: Home / Self Care | Payer: Medicaid Other | Attending: Emergency Medicine | Admitting: Emergency Medicine

## 2018-03-12 ENCOUNTER — Emergency Department (HOSPITAL_COMMUNITY)
Admission: EM | Admit: 2018-03-12 | Discharge: 2018-03-13 | Disposition: A | Discharge status: Home / Self Care | Payer: Medicaid Other | Source: Home / Self Care | Attending: Emergency Medicine | Admitting: Emergency Medicine

## 2018-03-12 DIAGNOSIS — F1994 Other psychoactive substance use, unspecified with psychoactive substance-induced mood disorder: Secondary | ICD-10-CM | POA: Insufficient documentation

## 2018-03-12 DIAGNOSIS — R45851 Suicidal ideations: Secondary | ICD-10-CM | POA: Insufficient documentation

## 2018-03-12 DIAGNOSIS — F909 Attention-deficit hyperactivity disorder, unspecified type: Secondary | ICD-10-CM | POA: Insufficient documentation

## 2018-03-12 DIAGNOSIS — F250 Schizoaffective disorder, bipolar type: Secondary | ICD-10-CM

## 2018-03-12 DIAGNOSIS — F319 Bipolar disorder, unspecified: Secondary | ICD-10-CM | POA: Insufficient documentation

## 2018-03-12 DIAGNOSIS — F17210 Nicotine dependence, cigarettes, uncomplicated: Secondary | ICD-10-CM | POA: Insufficient documentation

## 2018-03-12 DIAGNOSIS — F200 Paranoid schizophrenia: Secondary | ICD-10-CM | POA: Diagnosis not present

## 2018-03-12 DIAGNOSIS — F603 Borderline personality disorder: Secondary | ICD-10-CM | POA: Insufficient documentation

## 2018-03-12 DIAGNOSIS — F329 Major depressive disorder, single episode, unspecified: Secondary | ICD-10-CM | POA: Diagnosis present

## 2018-03-12 LAB — COMPREHENSIVE METABOLIC PANEL
ALT: 32 U/L (ref 0–44)
ALT: 27 U/L (ref 0–44)
AST: 30 U/L (ref 15–41)
AST: 26 U/L (ref 15–41)
Albumin: 4.6 g/dL (ref 3.5–5.0)
Albumin: 4.3 g/dL (ref 3.5–5.0)
Alkaline Phosphatase: 84 U/L (ref 38–126)
Alkaline Phosphatase: 84 U/L (ref 38–126)
Anion gap: 11 (ref 5–15)
Anion gap: 15 (ref 5–15)
BUN: 12 mg/dL (ref 6–20)
BUN: 10 mg/dL (ref 6–20)
CO2: 22 mmol/L (ref 22–32)
CO2: 27 mmol/L (ref 22–32)
Calcium: 9.7 mg/dL (ref 8.9–10.3)
Calcium: 9.6 mg/dL (ref 8.9–10.3)
Chloride: 103 mmol/L (ref 98–111)
Chloride: 99 mmol/L (ref 98–111)
Creatinine, Ser: 0.81 mg/dL (ref 0.61–1.24)
Creatinine, Ser: 0.84 mg/dL (ref 0.61–1.24)
GFR calc Af Amer: >60 mL/min (ref >60)
GFR calc Af Amer: >60 mL/min (ref >60)
GFR calc non Af Amer: >60 mL/min (ref >60)
GFR calc non Af Amer: >60 mL/min (ref >60)
Glucose, Bld: 80 mg/dL (ref 70–99)
Glucose, Bld: 78 mg/dL (ref 70–99)
Potassium: 3.8 mmol/L (ref 3.5–5.1)
Potassium: 3.6 mmol/L (ref 3.5–5.1)
Sodium: 136 mmol/L (ref 135–145)
Sodium: 141 mmol/L (ref 135–145)
Total Bilirubin: 0.6 mg/dL (ref 0.3–1.2)
Total Bilirubin: 0.5 mg/dL (ref 0.3–1.2)
Total Protein: 8.4 g/dL — ABNORMAL HIGH (ref 6.5–8.1)
Total Protein: 8.3 g/dL — ABNORMAL HIGH (ref 6.5–8.1)

## 2018-03-12 LAB — CBC
HCT: 50.3 % (ref 39.0–52.0)
HCT: 51.2 % (ref 39.0–52.0)
Hemoglobin: 15.6 g/dL (ref 13.0–17.0)
Hemoglobin: 15.4 g/dL (ref 13.0–17.0)
MCH: 26.7 pg (ref 26.0–34.0)
MCH: 25.8 pg — ABNORMAL LOW (ref 26.0–34.0)
MCHC: 31 g/dL (ref 30.0–36.0)
MCHC: 30.1 g/dL (ref 30.0–36.0)
MCV: 86 fL (ref 80.0–100.0)
MCV: 85.8 fL (ref 80.0–100.0)
Platelets: 335 10*3/uL (ref 150–400)
Platelets: 382 10*3/uL (ref 150–400)
RBC: 5.85 MIL/uL — ABNORMAL HIGH (ref 4.22–5.81)
RBC: 5.97 MIL/uL — ABNORMAL HIGH (ref 4.22–5.81)
RDW: 13.5 % (ref 11.5–15.5)
RDW: 13.4 % (ref 11.5–15.5)
WBC: 5.6 10*3/uL (ref 4.0–10.5)
WBC: 6.1 10*3/uL (ref 4.0–10.5)
nRBC: 0 % (ref 0.0–0.2)
nRBC: 0 % (ref 0.0–0.2)

## 2018-03-12 LAB — SALICYLATE LEVEL
Salicylate Lvl: <7 mg/dL (ref 2.8–30.0)
Salicylate Lvl: <7 mg/dL (ref 2.8–30.0)

## 2018-03-12 LAB — ACETAMINOPHEN LEVEL
Acetaminophen (Tylenol), Serum: <10 ug/mL — ABNORMAL LOW (ref 10–30)
Acetaminophen (Tylenol), Serum: <10 ug/mL — ABNORMAL LOW (ref 10–30)

## 2018-03-12 LAB — ETHANOL
Alcohol, Ethyl (B): <10 mg/dL (ref <10)
Alcohol, Ethyl (B): <10 mg/dL (ref <10)

## 2018-03-12 NOTE — Progress Notes (Addendum)
Patient ID: [name], male   DOB: 1995-10-23, 22 y.o.   MRN: [id number]  Pt's chart reviewed with treatment team and Dr Sharma Covert. Pt was assessed at [facility] in the past 24 hours. (see assessment note on 03-11-2018). Pt has an appointment this morning with his [facility]  ACT team. Pt is psychiatrically clear for discharge and follow up with his ACT team for further management of his mental health needs.    Laveda Abbe, [place] 03-12-2018    [phone]   Patient's chart reviewed and case discussed with the physician extender and developed treatment plan. Reviewed the information documented and agree with the treatment plan.  Juanetta Beets, DO 03/12/18 12:28 PM

## 2018-03-12 NOTE — ED Notes (Signed)
[facility]: [facility] Expected date:  Expected time:  Means of arrival:  Comments: 

## 2018-03-12 NOTE — ED Notes (Addendum)
Pt changed into purple scrubs. Security notifed for wanding. Staffing notifed for sitter. Pt's belongings placed in Pod F locker #4, not inventoried at this time.  
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
## 2018-03-12 NOTE — ED Notes (Signed)
Again, TTS attempted to complete assessment with patient, patient would not respond. Patient was observed to toss and turn one time. TTS continued to ask patient questions. Patient remained in the bed facing the wall, with no verbal responses. TTS Clinician unsure if patient is asleep or not wanting to talk. Melina Schools, RN, informed of situation.

## 2018-03-12 NOTE — ED Notes (Signed)
Pt calm and cooperative in bed.  Social work is waking patient for discharge.  ACTT on way to pick up patient for appointment.

## 2018-03-12 NOTE — ED Notes (Signed)
Pt discharged safely with actt team member.  All belongings were returned .

## 2018-03-12 NOTE — Discharge Instructions (Signed)
For your behavioral health needs, you are advised to continue treatment with the [facility] ACT Team: ° °     [facility] °     [address] °     [place], [place] [address] °     [phone] °

## 2018-03-12 NOTE — Progress Notes (Signed)
CSW aware patient was discharged from [facility] ED yesterday afternoon. Per notes, patient was supposed to have an appointment at [facility] between 9:00am-9:45am this morning. CSW reached out to [facility] ACT Team regarding said appointment. CSW informed that ACT Team was in their morning meeting. CSW asked that message be passed as soon as possible as patient may miss his appointment. Per receptionist, she would pass message along to Team Lead. CSW left information for return call.  9:28am- CSW received return phone call from [facility]. Per Willaim Sheng, she had cancelled patient's appointment this morning as she could not find patient. Per Willaim Sheng, she was waiting to hear from [facility] to see if they would still see patient or if he would need to reschedule. Billie requested that  CSW awaiting return call.   9:42am- CSW received return call from [place] who reports that [employer] will still see patient. Billie requested that patient be brought out to the car so that they can leave as quickly as possible. CSW has updated patient and RN who are agreeable to plan. No further social work needs at this time. Please reconsult if needs arise.   [name], LCSWA  Clinical Social Work Department  [employer]  [phone]

## 2018-03-12 NOTE — ED Notes (Signed)
TTS attempted to complete assessment with patient, patient would not respond. TTS observed patient in the bed under the covers and tossed and turned while his name was being called. TTS continued to call patient name and no response. Melina Schools, RN informed of situation. TTS will attempt again before shift ends.

## 2018-03-12 NOTE — ED Notes (Signed)
Pt alert and oriented, pt c/o of chronic back pain  And states he does not take pain medication. Pt states he si, and hi, pt denies any avh at this time. Pt refused to contract to safety. Pt will be closely monitored.

## 2018-03-12 NOTE — ED Triage Notes (Signed)
Pt here voluntarily by [employer]. Pt states he has SI but states he is usure of a plan. Pt not actively participating in conversation. Pt was recently seen at [facility] for HI. Pt does not report HI at present. Per [employer] pt's guardian is in rehab and pt was picked up at a gas station.

## 2018-03-12 NOTE — ED Notes (Signed)
Bed: WBH34 Expected date:  Expected time:  Means of arrival:  Comments: Hold for triage3 

## 2018-03-12 NOTE — ED Notes (Signed)
TTS attempted to complete assessment with patient, patient would not respond. Patient was observed to toss and turn one time. TTS continued to ask patient questions. Patient remained in the bed facing the wall. Melina Schools, RN, informed of situation.

## 2018-03-12 NOTE — ED Notes (Signed)
Pt's hands cold, face warm. Temperature reported to MD Pollina. No new orders at this time. Pt given multiple warm blankets. Pt sitting in A hall, will continue to monitor.

## 2018-03-12 NOTE — ED Provider Notes (Signed)
[facility]-EMERGENCY DEPT Provider Note   CSN: [id number] Arrival date & time: 03/12/18  0028     History   Chief Complaint Chief Complaint  Patient presents with  . Suicidal    HPI [name] is a 22 y.o. male.  Patient presents to the emergency department with a chief complaint of suicidal and homicidal ideation.  He states that he has no reason to live.  He was recently seen at [facility] for the same, and was discharged with strategic case management, however the patient states that he is feeling more suicidal now.  He also feels homicidal, but denies feeling this toward anyone in particular.  He complains of chronic low back pain.  Denies any other associated symptoms currently.  The history is provided by the patient. No language interpreter was used.    Past Medical History:  Diagnosis Date  . ADD (attention deficit disorder)   . 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 Anemia   . Anxiety   . Bipolar disorder (HCC)   . Borderline personality disorder (HCC)   . Central auditory processing disorder   . Deliberate self-cutting   . Depressed   . Eczema   . Headache(784.0)   . Oppositional defiant disorder   . Schizophrenia [facility])     Patient Active Problem List   Diagnosis Date Noted  . Substance induced mood disorder (HCC) 03/10/2018  . Paranoid schizophrenia (HCC)   . Cannabis use disorder, moderate, dependence (HCC) 06/05/2016  . Eating disorder Unspecified 05/22/2016  . Borderline personality disorder (HCC) 05/16/2016  . Tobacco use disorder 05/15/2016    Past Surgical History:  Procedure Laterality Date  . BACK SURGERY    . FRACTURE SURGERY    . NO PAST SURGERIES    . POSTERIOR LUMBAR FUSION N/A 02/19/2015   Procedure: LATERAL L-2 CORPECTOMY;  Surgeon: Lisbeth Renshaw, MD;  Location: [facility] OR;  Service: Neurosurgery;  Laterality: N/A;  . POSTERIOR LUMBAR FUSION 4 LEVEL  02/19/2015   Procedure: Posterior T-12 - L-4 STABILIZATION OF POSTERIOR LUMBAR;  Surgeon: Lisbeth Renshaw, MD;  Location: [facility] OR;  Service: Neurosurgery;;  . TIBIA IM NAIL INSERTION Left 02/20/2015   Procedure: INTRAMEDULLARY (IM) NAIL LEFT TIBIAL;  Surgeon: [name], MD;  Location: [facility] OR;  Service: Orthopedics;  Laterality: Left;        Home Medications    Prior to Admission medications   Not on File    Family History No family history on file.  Social History Social History   Tobacco Use  . Smoking status: Current Every Day Smoker    Packs/day: 2.00    Types: Cigarettes  . Smokeless tobacco: Never Used  . Tobacco comment: 1-3 packs   Substance Use Topics  . Alcohol use: Yes    Alcohol/week: 2.0 standard drinks    Types: 2 Shots of liquor per week    Comment:  weekends some   . Drug use: Yes    Types: Marijuana, Cocaine    Comment: crack & marijuana last used 10-27-16     Allergies   Clozapine; Prolixin [fluphenazine]; and Risperdal [risperidone]   Review of Systems Review of Systems  All other systems reviewed and are negative.    Physical Exam Updated Vital Signs There were no vitals taken for this visit.  Physical Exam  Constitutional: He is oriented to person, place, and time. He appears well-developed and well-nourished.  HENT:  Head: Normocephalic and atraumatic.  Eyes: Pupils are equal,  round, and reactive to light. Conjunctivae and EOM are normal. Right eye exhibits no discharge. Left eye exhibits no discharge. No scleral icterus.  Neck: Normal range of motion. Neck supple. No JVD present.  Cardiovascular: Normal rate, regular rhythm and normal heart sounds. Exam reveals no gallop and no friction rub.  No murmur heard. Pulmonary/Chest: Effort normal and breath sounds normal. No respiratory distress. He has no wheezes. He has no rales. He exhibits no tenderness.  Abdominal: Soft. He exhibits no distension and no mass. There is no tenderness. There is no  rebound and no guarding.  Musculoskeletal: Normal range of motion. He exhibits no edema or tenderness.  Neurological: He is alert and oriented to person, place, and time.  Skin: Skin is warm and dry.  Psychiatric:  Flat affect  Nursing note and vitals reviewed.    ED Treatments / Results  Labs (all labs ordered are listed, but only abnormal results are displayed) Labs Reviewed  COMPREHENSIVE METABOLIC PANEL - Abnormal; Notable for the following components:      Result Value   Total Protein 8.4 (*)    All other components within normal limits  ACETAMINOPHEN LEVEL - Abnormal; Notable for the following components:   Acetaminophen (Tylenol), Serum <10 (*)    All other components within normal limits  CBC - Abnormal; Notable for the following components:   RBC 5.85 (*)    All other components within normal limits  ETHANOL  SALICYLATE LEVEL  RAPID URINE DRUG SCREEN, HOSP PERFORMED    EKG None  Radiology No results found.  Procedures Procedures (including critical care time)  Medications Ordered in ED Medications - No data to display   Initial Impression / Assessment and Plan / ED Course  I have reviewed the triage vital signs and the nursing notes.  Pertinent labs & imaging results that were available during my care of the patient were reviewed by me and considered in my medical decision making (see chart for details).     Patient was suicidal ideation and reported homicidal ideation.  No clear specific plan.  Patient does have a caregiver, but this person is unable to care for the patient at this time.  Case management was involved.  Unclear what happened after discharge yesterday.  Patient returns to the emergency department complaining of increased suicidal thoughts.  Will consult TTS again for further evaluation, but may need case management in the morning.  Final Clinical Impressions(s) / ED Diagnoses   Final diagnoses:  Suicidal ideation    ED Discharge Orders     None       Roxy Horseman, PA-C 03/12/18 0416    Paula Libra, MD 03/12/18 [phone]

## 2018-03-12 NOTE — ED Notes (Signed)
Pt aware that a urine sample is needed.  Pt unable at this time.  RN notified 

## 2018-03-13 ENCOUNTER — Encounter (HOSPITAL_COMMUNITY): Payer: Self-pay | Admitting: Behavioral Health

## 2018-03-13 NOTE — Progress Notes (Signed)
Nurse, Thurston Pounds, stated that pt is sleeping and was uncooperative prior to going to sleep. TTS will attempt to assess later on.

## 2018-03-13 NOTE — Progress Notes (Signed)
Pt. meets criteria for inpatient treatment per Reola Calkins, NP.  Referred out to the following hospitals: [facility]  [facility]  [facility]  [facility]  [facility]  [facility]  [facility]  [facility]  [facility]     Disposition CSW will continue to follow for placement.  Timmothy Euler. Kaylyn Lim, MSW, LCSWA Disposition Clinical Social Work [phone] (cell) [phone] (office)

## 2018-03-13 NOTE — ED Notes (Signed)
Belongings inventoried, wanded by security

## 2018-03-13 NOTE — ED Notes (Signed)
Pt still saying that he is unable to void and give a urine sample at this time.

## 2018-03-13 NOTE — ED Provider Notes (Signed)
[facility] EMERGENCY DEPARTMENT Provider Note   CSN: [id number] Arrival date & time: 03/12/18  2214     History   Chief Complaint Chief Complaint  Patient presents with  . Suicidal    HPI [name] is a 22 y.o. male.   22 year old male with history of ADD, ADHD, bipolar disorder, borderline personality disorder, schizophrenia presents to the emergency department reporting suicidal ideations.  Reports history of suicide attempt in 2016 after jumping off an overpass.  Endorses being hospitalized at the time.  He has been seen twice over the past 48 hours for similar complaints.  States that he cannot recall what prompted his visit to [facility].  No specific suicidal plan.  He is noncompliant with his psychiatric medications, stating that he does not like taking them.  He denies any alcohol or illicit drug use.     Past Medical History:  Diagnosis Date  . ADD (attention deficit disorder)   . 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 Anemia   . Anxiety   . Bipolar disorder (HCC)   . Borderline personality disorder (HCC)   . Central auditory processing disorder   . Deliberate self-cutting   . Depressed   . Eczema   . Headache(784.0)   . Oppositional defiant disorder   . Schizophrenia [facility])     Patient Active Problem List   Diagnosis Date Noted  . Substance induced mood disorder (HCC) 03/10/2018  . Paranoid schizophrenia (HCC)   . Cannabis use disorder, moderate, dependence (HCC) 06/05/2016  . Eating disorder Unspecified 05/22/2016  . Borderline personality disorder (HCC) 05/16/2016  . Tobacco use disorder 05/15/2016    Past Surgical History:  Procedure Laterality Date  . BACK SURGERY    . FRACTURE SURGERY    . NO PAST SURGERIES    . POSTERIOR LUMBAR FUSION N/A 02/19/2015   Procedure: LATERAL L-2 CORPECTOMY;  Surgeon: Lisbeth Renshaw, MD;  Location: [facility] OR;  Service: Neurosurgery;   Laterality: N/A;  . POSTERIOR LUMBAR FUSION 4 LEVEL  02/19/2015   Procedure: Posterior T-12 - L-4 STABILIZATION OF POSTERIOR LUMBAR;  Surgeon: Lisbeth Renshaw, MD;  Location: [facility] OR;  Service: Neurosurgery;;  . TIBIA IM NAIL INSERTION Left 02/20/2015   Procedure: INTRAMEDULLARY (IM) NAIL LEFT TIBIAL;  Surgeon: [name], MD;  Location: [facility] OR;  Service: Orthopedics;  Laterality: Left;        Home Medications    Prior to Admission medications   Not on File    Family History No family history on file.  Social History Social History   Tobacco Use  . Smoking status: Current Every Day Smoker    Packs/day: 2.00    Types: Cigarettes  . Smokeless tobacco: Never Used  . Tobacco comment: 1-3 packs   Substance Use Topics  . Alcohol use: Yes    Alcohol/week: 2.0 standard drinks    Types: 2 Shots of liquor per week    Comment:  weekends some   . Drug use: Yes    Types: Marijuana, Cocaine    Comment: crack & marijuana last used 10-27-16     Allergies   Clozapine; Prolixin [fluphenazine]; and Risperdal [risperidone]   Review of Systems Review of Systems Ten systems reviewed and are negative for acute change, except as noted in the HPI.    Physical Exam Updated Vital Signs BP (!) 98/50 Comment: reported to Dr Blinda Leatherwood  Pulse (!) 59   Temp (!) 97.5 F (36.4 C) (Oral)  Resp 16   Ht 5\' 8"  (1.727 m)   SpO2 99%   BMI 22.05 kg/m   Physical Exam  Constitutional: He is oriented to person, place, and time. He appears well-developed and well-nourished. No distress.  Nontoxic appearing and in NAD  HENT:  Head: Normocephalic and atraumatic.  Eyes: Conjunctivae and EOM are normal. No scleral icterus.  Neck: Normal range of motion.  Pulmonary/Chest: Effort normal. No stridor. No respiratory distress.  Respirations even and unlabored  Musculoskeletal: Normal range of motion.  Neurological: He is alert and oriented to person, place, and time.  Skin: Skin is warm and dry. No  rash noted. He is not diaphoretic. No erythema. No pallor.  Scattered superficial lacerations to hands. No active bleeding or exposed subcutaneous fat.  Psychiatric: He is withdrawn. He expresses suicidal ideation. He expresses no suicidal plans.  Flat affect. Quiet voice.  Nursing note and vitals reviewed.    ED Treatments / Results  Labs (all labs ordered are listed, but only abnormal results are displayed) Labs Reviewed  COMPREHENSIVE METABOLIC PANEL - Abnormal; Notable for the following components:      Result Value   Total Protein 8.3 (*)    All other components within normal limits  ACETAMINOPHEN LEVEL - Abnormal; Notable for the following components:   Acetaminophen (Tylenol), Serum <10 (*)    All other components within normal limits  CBC - Abnormal; Notable for the following components:   RBC 5.97 (*)    MCH 25.8 (*)    All other components within normal limits  ETHANOL  SALICYLATE LEVEL  RAPID URINE DRUG SCREEN, HOSP PERFORMED    EKG None  Radiology No results found.  Procedures Procedures (including critical care time)  Medications Ordered in ED Medications - No data to display   Initial Impression / Assessment and Plan / ED Course  I have reviewed the triage vital signs and the nursing notes.  Pertinent labs & imaging results that were available during my care of the patient were reviewed by me and considered in my medical decision making (see chart for details).     22 year old male, well-known to the emergency department with 3 visits in the past 3 days, presents for persistent suicidal ideations.  Laboratory work-up reviewed and patient medically cleared.  Pending TTS evaluation to assist in disposition.  Signed out to oncoming ED provider at change of shift.   Final Clinical Impressions(s) / ED Diagnoses   Final diagnoses:  Suicidal ideation    ED Discharge Orders    None       [name], PA-C 03/13/18 0534    Geoffery Lyons,  MD 03/13/18 [phone]

## 2018-03-13 NOTE — ED Notes (Addendum)
Pt discharged to [facility] via [employer] Report given to Lu Duffel, RN

## 2018-03-13 NOTE — BH Assessment (Addendum)
Tele Assessment Note   Patient Name: [name] MRN: [id number] Referring Physician: [name] Location of Patient: [facility] Location of Provider: Behavioral Health TTS Department  Susie Cassette [name] is an 22 y.o. male wjo has presented in the ED 2-3 times or more this week indicating that he was suicidal, but had no plan or intent.  He was discharged from the ED and linked to his ACTT Team on these occasions.  However, patient presented to the ED last night again with suicidal thoughts, but this morning he is stating that he has a plan to either hand himself or overdose. Patient did not identify any plans to shoot himself, but when asked if he had access to firearms, he said yes.  Patient states that he has a history of overdose on two occasions in the past by overdose in 2014 and 2016.  Patient's primary stressor has been that his mother, who is his guardian, has been in a drug rehab program and patient is on his own and has an unstable housing situation which is causing him to feels stressed.  Patient does not have good coping skills.  He states that he is also not on any medications for his mental illness.  Patient denies HI and states that he is currently not experiencing any psychotic symptoms.  Patient states that he has a history of drug use, but states that he has not used any drugs in the past two months.  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  He states that he started using ecstasy earlier this year and states that he was sing daily at one point, but could not provide an amount that he was using. However, patient did have a positive UDS for Benzodiazepines and marijuana upon his arrival to the ED. Patient states that he has a history of cutting behavior, but states that he has not cut in the past two weeks.   Patient states that he is not experiencing any sleep  disturbance, but could not identify how many hours he has been sleeping per night.  Patient states that he has recently lost eight lbs, but states that he has been trying to lose weight. Patient states that he has no current legal issues and he is not on probation.  Patient is essentially homeless.  Patient presented as alert and oriented.  His thoughts were organized and his memory was intact.  His appearance was unremarkable and his eye contact was good.  He was soft spoken and difficult to understand at times.  Patient's insight, judgment and impulse control are currently impaired.  He exhibited a depressed mood and flat affect.  His psychomotor activity was unremarkable.  He did not appear to be responding to any internal stimuli at the time of his assessment.   Diagnosis: F 25.0 Schizoaffective Disorder Bipolar Type  Past Medical History:  Past Medical History:  Diagnosis Date  . ADD (attention deficit disorder)   . 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 Anemia   . Anxiety   . Bipolar disorder (HCC)   . Borderline personality disorder (HCC)   . Central auditory processing disorder   . Deliberate self-cutting   . Depressed   . Eczema   . Headache(784.0)   . Oppositional defiant disorder   . Schizophrenia [facility])     Past Surgical History:  Procedure Laterality Date  .  BACK SURGERY    . FRACTURE SURGERY    . NO PAST SURGERIES    . POSTERIOR LUMBAR FUSION N/A 02/19/2015   Procedure: LATERAL L-2 CORPECTOMY;  Surgeon: Lisbeth Renshaw, MD;  Location[facility];  Service: Neurosurgery;  Laterality: N/A;  . POSTERIOR LUMBAR FUSION 4 LEVEL  02/19/2015   Procedure: Posterior T-12 - L-4 STABILIZATION OF POSTERIOR LUMBAR;  Surgeon: Lisbeth Renshaw, MD;  Location[facility];  Service: Neurosurgery;;  . TIBIA IM NAIL INSERTION Left 02/20/2015   Procedure: INTRAMEDULLARY (IM) NAIL LEFT TIBIAL;  Surgeon: [name], MD;  Location: [facility] OR;  Service: Orthopedics;   Laterality: Left;    Family History: No family history on file.  Social History:  reports that he has been smoking cigarettes. He has been smoking about 2.00 packs per day. He has never used smokeless tobacco. He reports that he drinks about 2.0 standard drinks of alcohol per week. He reports that he has current or past drug history. Drugs: Marijuana and Cocaine.  Additional Social History:  Alcohol / Drug Use Pain Medications: See MAR Prescriptions: See MAR Over the Counter: See MAR History of alcohol / drug use?: Yes Longest period of sobriety (when/how long): patient states that he has not used any drugs in the past two months Withdrawal Symptoms: Other (Comment)(none reported) Substance #1 Name of Substance 1: cocaine 1 - Age of First Use: 19 1 - Amount (size/oz): 1-2 grams 1 - Frequency: q 2 weeks 1 - Duration: since onset 1 - Last Use / Amount: denies use in past 2 months Substance #2 Name of Substance 2: Ecstacy 2 - Age of First Use: 22 2 - Amount (size/oz): "depends" 2 - Frequency: daily 2 - Duration: since onset 2 - Last Use / Amount: no use in two months Substance #3 Name of Substance 3: Marijuana 3 - Age of First Use: 17 3 - Amount (size/oz): unknown 3 - Frequency: hx of daily use 3 - Duration: since onset 3 - Last Use / Amount: patient states that he has not used marijuana in awhile  CIWA: CIWA-Ar BP: (!) 98/50(reported to Dr Blinda Leatherwood) Pulse Rate: (!) 59 COWS:    Allergies:  Allergies  Allergen Reactions  . Clozapine Hives and Other (See Comments)    Increased blood pressure  . Prolixin [Fluphenazine] Other (See Comments)    Hallucinations  . Risperdal [Risperidone] Other (See Comments)    Unknown    Home Medications:  (Not in a hospital admission)  OB/GYN Status:  No LMP for male patient.  General Assessment Data Assessment unable to be completed: Yes Reason for not completing assessment: PT asleep per Nurse, Thurston Pounds.  [facility] Assessment[facility]  ED TTS Assessment: In system Is this a Tele or Face-to-Face Assessment?: Tele Assessment Is this an Initial Assessment or a Re-assessment for this encounter?: Initial Assessment Patient Accompanied by:: N/A Language Other than English: No Living Arrangements: Homeless/Shelter What gender do you identify as?: Male Marital status: Single Living Arrangements: Alone Can pt return to current living arrangement?: Yes Admission Status: Voluntary Is patient capable of signing voluntary admission?: Yes Referral Source: Self/Family/Friend Insurance type: (Medicaid)     Crisis Care Plan Living Arrangements: Alone Legal Guardian: Mother Name of Psychiatrist: [employer], television/film/video ACTT) Name of Therapist: Producer, television/film/video ACTT)  Education Status Is patient currently in school?: No Is the patient employed, unemployed or receiving disability?: Unemployed, Receiving disability income  Risk to self with the past 6 months Suicidal Ideation: Yes-Currently Present Has patient been a risk to self  within the past 6 months prior to admission? : No Suicidal Intent: No(came to ED for help) Has patient had any suicidal intent within the past 6 months prior to admission? : No Is patient at risk for suicide?: Yes Suicidal Plan?: Yes-Currently Present Has patient had any suicidal plan within the past 6 months prior to admission? : No Specify Current Suicidal Plan: overdose or hand self Access to Means: Yes Specify Access to Suicidal Means: (rope or pills) What has been your use of drugs/alcohol within the last 12 months?: (has not used any drugs in the past two months) Previous Attempts/Gestures: Yes How many times?: 2(overdoses in 2014 and 2016) Other Self Harm Risks: homeless, minimal support(Hx of cutting, last was 2 weeks ago) Triggers for Past Attempts: Unpredictable Intentional Self Injurious Behavior: Cutting Comment - Self Injurious Behavior: (has not cut in 2 weeks) Family Suicide History: No Recent  stressful life event(s): Other (Comment)(Guardian in Drug Rehab, nowhere to live) Persecutory voices/beliefs?: No Depression: Yes Depression Symptoms: Isolating, Loss of interest in usual pleasures, Feeling worthless/self pity Substance abuse history and/or treatment for substance abuse?: Yes Suicide prevention information given to non-admitted patients: Not applicable  Risk to Others within the past 6 months Homicidal Ideation: No Does patient have any lifetime risk of violence toward others beyond the six months prior to admission? : No Thoughts of Harm to Others: No Current Homicidal Intent: No Current Homicidal Plan: No Access to Homicidal Means: No Identified Victim: none History of harm to others?: No Assessment of Violence: None Noted Violent Behavior Description: none Does patient have access to weapons?: No Criminal Charges Pending?: No Does patient have a court date: No Is patient on probation?: No  Psychosis Hallucinations: None noted Delusions: None noted  Mental Status Report Appearance/Hygiene: Unremarkable Eye Contact: Good Motor Activity: Freedom of movement Speech: Logical/coherent Level of Consciousness: Alert Mood: Depressed Affect: Blunted Anxiety Level: None Thought Processes: Coherent, Relevant Judgement: Impaired Orientation: Person, Place, Time, Situation Obsessive Compulsive Thoughts/Behaviors: None  Cognitive Functioning Concentration: Normal Memory: Recent Intact, Remote Intact Is patient IDD: No Insight: Fair Impulse Control: Poor Appetite: Fair Have you had any weight changes? : Loss(patient states that he was trying to lose weight) Amount of the weight change? (lbs): 8 lbs Sleep: No Change Total Hours of Sleep: 8 Vegetative Symptoms: None  ADLScreening [facility] Assessment Services) Patient's cognitive ability adequate to safely complete daily activities?: Yes Patient able to express need for assistance with ADLs?: Yes Independently  performs ADLs?: Yes (appropriate for developmental age)  Prior Inpatient Therapy Prior Inpatient Therapy: Yes Prior Therapy Dates: 01/2018 Prior Therapy Facilty/Provider(s): facility in [place] Reason for Treatment: depression  Prior Outpatient Therapy Prior Outpatient Therapy: Yes Prior Therapy Dates: (active clien) Prior Therapy Facilty/Provider(s): [facility]) Reason for Treatment: (Schizoaffective) Does patient have an ACCT team?: Yes Does patient have Intensive In-House Services?  : Yes Does patient have [facility] services? : No Does patient have P4CC services?: No  ADL Screening (condition at time of admission) Patient's cognitive ability adequate to safely complete daily activities?: Yes Is the patient deaf or have difficulty hearing?: No Does the patient have difficulty seeing, even when wearing glasses/contacts?: No Does the patient have difficulty concentrating, remembering, or making decisions?: No Patient able to express need for assistance with ADLs?: Yes Does the patient have difficulty dressing or bathing?: No Independently performs ADLs?: Yes (appropriate for developmental age) Does the patient have difficulty walking or climbing stairs?: No Weakness of Legs: None Weakness of Arms/Hands: None  Home  Assistive Devices/Equipment Home Assistive Devices/Equipment: None  Therapy Consults (therapy consults require a physician order) PT Evaluation Needed: No OT Evalulation Needed: No SLP Evaluation Needed: No Abuse/Neglect Assessment (Assessment to be complete while patient is alone) Abuse/Neglect Assessment Can Be Completed: Yes Physical Abuse: Denies Verbal Abuse: Denies Sexual Abuse: Denies Exploitation of patient/patient's resources: Denies Self-Neglect: Denies Values / Beliefs Cultural Requests During Hospitalization: None Spiritual Requests During Hospitalization: None Consults Spiritual Care Consult Needed: No Social Work Consult Needed:  No Merchant navy officer (For Healthcare) Does Patient Have a Medical Advance Directive?: No Would patient like information on creating a medical advance directive?: No - Patient declined Nutrition Screen- [facility]/AP Has the patient recently lost weight without trying?: No Has the patient been eating poorly because of a decreased appetite?: No Malnutrition Screening Tool Score: 0        Disposition: Per Dr Sydnee Levans, NP, Inpatient Treatment is recommended Disposition Initial Assessment Completed for this Encounter: Yes Patient referred to: (outside facilities)  This service was provided via telemedicine using a 2-way, interactive audio and Immunologist.  Names of all persons participating in this telemedicine service and their role in this encounter. Name: Harrietta Guardian Role: patient  Name: Vianne Grieshop Role: TTS  Name:  Role:   Name:  Role:     Arnoldo Lenis Darlynn Ricco 03/13/2018 9:22 AM
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
## 2018-03-13 NOTE — Progress Notes (Signed)
Pt accepted to [facility]  Dr. Forrestine Him is the accepting/attending provider.  Call report to [phone] [facility] notified.   Pt is Voluntary.  Pt may be transported by [facility] to the [facility]. FIRST Pt scheduled  to arrive at [facility] as soon as transport can be arranged.Carney Bern T. Kaylyn Lim, MSW, LCSWA Disposition Clinical Social Work [phone] (cell) [phone] (office)

## 2018-06-05 ENCOUNTER — Emergency Department (HOSPITAL_COMMUNITY)
Admission: EM | Admit: 2018-06-05 | Discharge: 2018-06-06 | Disposition: A | Discharge status: Home / Self Care | Payer: Medicaid Other | Attending: Emergency Medicine | Admitting: Emergency Medicine

## 2018-06-05 ENCOUNTER — Other Ambulatory Visit: Payer: Self-pay

## 2018-06-05 ENCOUNTER — Encounter (HOSPITAL_COMMUNITY): Payer: Self-pay | Admitting: Emergency Medicine

## 2018-06-05 DIAGNOSIS — R45851 Suicidal ideations: Secondary | ICD-10-CM | POA: Diagnosis not present

## 2018-06-05 DIAGNOSIS — F319 Bipolar disorder, unspecified: Secondary | ICD-10-CM | POA: Insufficient documentation

## 2018-06-05 DIAGNOSIS — F1410 Cocaine abuse, uncomplicated: Secondary | ICD-10-CM | POA: Diagnosis not present

## 2018-06-05 DIAGNOSIS — G4700 Insomnia, unspecified: Secondary | ICD-10-CM | POA: Insufficient documentation

## 2018-06-05 DIAGNOSIS — F17210 Nicotine dependence, cigarettes, uncomplicated: Secondary | ICD-10-CM | POA: Diagnosis not present

## 2018-06-05 DIAGNOSIS — F1210 Cannabis abuse, uncomplicated: Secondary | ICD-10-CM | POA: Insufficient documentation

## 2018-06-05 DIAGNOSIS — F251 Schizoaffective disorder, depressive type: Secondary | ICD-10-CM | POA: Insufficient documentation

## 2018-06-05 LAB — ACETAMINOPHEN LEVEL: Acetaminophen (Tylenol), Serum: <10 ug/mL — ABNORMAL LOW (ref 10–30)

## 2018-06-05 LAB — COMPREHENSIVE METABOLIC PANEL
ALT: 36 U/L (ref 0–44)
AST: 38 U/L (ref 15–41)
Albumin: 4.7 g/dL (ref 3.5–5.0)
Alkaline Phosphatase: 83 U/L (ref 38–126)
Anion gap: 12 (ref 5–15)
BUN: 8 mg/dL (ref 6–20)
CO2: 30 mmol/L (ref 22–32)
Calcium: 10.3 mg/dL (ref 8.9–10.3)
Chloride: 105 mmol/L (ref 98–111)
Creatinine, Ser: 0.85 mg/dL (ref 0.61–1.24)
GFR calc Af Amer: >60 mL/min (ref >60)
GFR calc non Af Amer: >60 mL/min (ref >60)
Glucose, Bld: 88 mg/dL (ref 70–99)
Potassium: 4.2 mmol/L (ref 3.5–5.1)
Sodium: 147 mmol/L — ABNORMAL HIGH (ref 135–145)
Total Bilirubin: 0.5 mg/dL (ref 0.3–1.2)
Total Protein: 8.1 g/dL (ref 6.5–8.1)

## 2018-06-05 LAB — CBC
HCT: 50.9 % (ref 39.0–52.0)
Hemoglobin: 16.1 g/dL (ref 13.0–17.0)
MCH: 26.8 pg (ref 26.0–34.0)
MCHC: 31.6 g/dL (ref 30.0–36.0)
MCV: 84.7 fL (ref 80.0–100.0)
Platelets: 241 10*3/uL (ref 150–400)
RBC: 6.01 MIL/uL — ABNORMAL HIGH (ref 4.22–5.81)
RDW: 13.6 % (ref 11.5–15.5)
WBC: 8.3 10*3/uL (ref 4.0–10.5)
nRBC: 0 % (ref 0.0–0.2)

## 2018-06-05 LAB — RAPID URINE DRUG SCREEN, HOSP PERFORMED
Amphetamines: NOT DETECTED
Barbiturates: NOT DETECTED
Benzodiazepines: NOT DETECTED
Cocaine: NOT DETECTED
Opiates: NOT DETECTED
Tetrahydrocannabinol: NOT DETECTED

## 2018-06-05 LAB — ETHANOL: Alcohol, Ethyl (B): <10 mg/dL (ref <10)

## 2018-06-05 LAB — SALICYLATE LEVEL: Salicylate Lvl: <7 mg/dL (ref 2.8–30.0)

## 2018-06-05 MED ORDER — ZIPRASIDONE MESYLATE 20 MG IM SOLR
10.0000 mg | Freq: Two times a day (BID) | INTRAMUSCULAR | Status: DC | PRN
  Administered 2018-06-05: 10 mg via INTRAMUSCULAR
  Filled 2018-06-05: qty 20

## 2018-06-05 MED ORDER — LORAZEPAM 1 MG PO TABS: 1.0000 mg | ORAL_TABLET | ORAL | Status: DC | PRN
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
## 2018-06-05 NOTE — BH Assessment (Addendum)
Assessment Note  Zachary FilaMalcolm Legrand [name] is an 23 y.o. male.  The pt stated he came in because he wants to get something to go to sleep.  When asked about SI, the pt stated he thinks about killing himself, but doesn't have any plans to kill himself.  He said [place] and a relationship he is in.  The pt had 2 suicide attempts in the past.  He jumped off of a bridge in 2016 and overdosed on pills in 2014.  The pt denies feeling the way he did in 2014 or 2016.  He is a client with [facility].  He was last inpatient at [facility] 03/2018 for SI.    The pt lives in a transitional house.  The pt recently got out of jail 2-3 days ago.  He was in jail for the past 2 months.  He is currently not working.  The pt last cut about 2 weeks ago.  He denies a history of abuse and hallucinations.  The pt stated he is eating well.  He has a history of an eating disorder.  The pt has a history of abusing cocaine and ecstasy.  He has not used any substance in 2 months.  Pt is dressed in scrubs. He is alert and oriented x4. Pt speaks in a clear tone, at moderate volume and slow pace. Eye contact is fair. Pt's mood is bizarre.  The pt would laugh inappropriately during the assessment. Thought process is coherent and relevant. Pt was cooperative throughout assessment.       Diagnosis:  F25.1 Schizoaffective disorder, Depressive type, by history  Past Medical History:  Past Medical History:  Diagnosis Date  . ADD (attention deficit disorder)   . 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 Anemia   . Anxiety   . Bipolar disorder (HCC)   . Borderline personality disorder (HCC)   . Central auditory processing disorder   . Deliberate self-cutting   . Depressed   . Eczema   . Headache(784.0)   . Oppositional defiant disorder   . Schizophrenia [facility](HCC)     Past Surgical History:  Procedure Laterality Date  . BACK SURGERY    . FRACTURE SURGERY     . NO PAST SURGERIES    . POSTERIOR LUMBAR FUSION N/A 02/19/2015   Procedure: LATERAL L-2 CORPECTOMY;  Surgeon: Lisbeth RenshawNeelesh Nundkumar, MD;  Location: [facility] OR;  Service: Neurosurgery;  Laterality: N/A;  . POSTERIOR LUMBAR FUSION 4 LEVEL  02/19/2015   Procedure: Posterior T-12 - L-4 STABILIZATION OF POSTERIOR LUMBAR;  Surgeon: Lisbeth RenshawNeelesh Nundkumar, MD;  Location: [facility] OR;  Service: Neurosurgery;;  . TIBIA IM NAIL INSERTION Left 02/20/2015   Procedure: INTRAMEDULLARY (IM) NAIL LEFT TIBIAL;  Surgeon: Samson FredericBrian Swinteck, MD;  Location: [facility] OR;  Service: Orthopedics;  Laterality: Left;    Family History: No family history on file.  Social History:  reports that he has been smoking cigarettes. He has been smoking about 2.00 packs per day. He has never used smokeless tobacco. He reports current alcohol use of about 2.0 standard drinks of alcohol per week. He reports current drug use. Drugs: Marijuana and Cocaine.  Additional Social History:  Alcohol / Drug Use Pain Medications: See MAR Prescriptions: See MAR Over the Counter: See MAR History of alcohol / drug use?: Yes Longest period of sobriety (when/how long): 2 months Substance #1 Name of Substance 1: cocaine 1 - Last Use / Amount: 2 months ago Substance #  2 Name of Substance 2: ecstacy 2 - Last Use / Amount: 2 months ago  CIWA: CIWA-Ar BP: 119/86 Pulse Rate: 75 COWS:    Allergies:  Allergies  Allergen Reactions  . Clozapine Hives and Other (See Comments)    Increased blood pressure  . Prolixin [Fluphenazine] Other (See Comments)    Hallucinations  . Risperdal [Risperidone] Other (See Comments)    Unknown    Home Medications: (Not in a hospital admission)   OB/GYN Status:  No LMP for male patient.  General Assessment Data TTS Assessment: In system Is this a Tele or Face-to-Face Assessment?: Face-to-Face Is this an Initial Assessment or a Re-assessment for this encounter?: Initial Assessment Patient Accompanied by:: N/A Language Other  than English: No Living Arrangements: Homeless/Shelter(transitional home) What gender do you identify as?: Male Marital status: Single Living Arrangements: Other (Comment)(transitional home) Can pt return to current living arrangement?: Yes Admission Status: Voluntary Is patient capable of signing voluntary admission?: Yes Referral Source: Self/Family/Friend Insurance type: Medicaid     Crisis Care Plan Living Arrangements: Other (Comment)(transitional home) Legal Guardian: Mother Name of Psychiatrist: [facility] Name of Therapist: [facility]  Education Status Is patient currently in school?: No Is the patient employed, unemployed or receiving disability?: Unemployed  Risk to self with the past 6 months Suicidal Ideation: Yes-Currently Present Has patient been a risk to self within the past 6 months prior to admission? : No Suicidal Intent: No Has patient had any suicidal intent within the past 6 months prior to admission? : No Is patient at risk for suicide?: Yes Suicidal Plan?: No Has patient had any suicidal plan within the past 6 months prior to admission? : No Access to Means: No What has been your use of drugs/alcohol within the last 12 months?: cocaine and MMDA use in the past Previous Attempts/Gestures: Yes How many times?: 2 Other Self Harm Risks: cutting Triggers for Past Attempts: Unpredictable Intentional Self Injurious Behavior: Cutting Comment - Self Injurious Behavior: (cutting) Family Suicide History: No Recent stressful life event(s): Other (Comment)(wanting to be in [place]) Persecutory voices/beliefs?: No Depression: Yes Depression Symptoms: Despondent, Insomnia Substance abuse history and/or treatment for substance abuse?: Yes Suicide prevention information given to non-admitted patients: Not applicable  Risk to Others within the past 6 months Homicidal Ideation: No Does patient have any lifetime risk of violence toward others beyond the  six months prior to admission? : No Thoughts of Harm to Others: No Current Homicidal Intent: No Current Homicidal Plan: No Access to Homicidal Means: No Identified Victim: Pt deneis History of harm to others?: No Assessment of Violence: In past 6-12 months Violent Behavior Description: Pt has a history of throwing things at staff Does patient have access to weapons?: No Criminal Charges Pending?: No Does patient have a court date: No Is patient on probation?: No  Psychosis Hallucinations: None noted Delusions: None noted  Mental Status Report Appearance/Hygiene: In scrubs, Unremarkable Eye Contact: Fair Motor Activity: Other (Comment), Freedom of movement(playing with hair ) Speech: Slow Level of Consciousness: Alert Mood: (bizarre) Affect: (bizarre) Anxiety Level: None Thought Processes: Coherent, Relevant Judgement: Partial Orientation: Person, Place, Time, Situation Obsessive Compulsive Thoughts/Behaviors: None  Cognitive Functioning Concentration: Normal Memory: Recent Intact, Remote Intact Is patient IDD: No Insight: Fair Impulse Control: Poor Appetite: Fair Have you had any weight changes? : No Change Sleep: Decreased Total Hours of Sleep: 5 Vegetative Symptoms: None  ADLScreening [facility] Assessment Services) Patient's cognitive ability adequate to safely complete daily activities?: Yes Patient able to  express need for assistance with ADLs?: Yes Independently performs ADLs?: Yes (appropriate for developmental age)  Prior Inpatient Therapy Prior Inpatient Therapy: Yes Prior Therapy Dates: 03/2018 [facility] Reason for Treatment: SI  Prior Outpatient Therapy Prior Outpatient Therapy: Yes Prior Therapy Dates: current Prior Therapy Facilty/Provider(s): [facility] Reason for Treatment: bipolar Does patient have an ACCT team?: Yes Does patient have Intensive In-House Services?  : No Does patient have [facility] services?  : No Does patient have P4CC services?: No  ADL Screening (condition at time of admission) Patient's cognitive ability adequate to safely complete daily activities?: Yes Patient able to express need for assistance with ADLs?: Yes Independently performs ADLs?: Yes (appropriate for developmental age)       Abuse/Neglect Assessment (Assessment to be complete while patient is alone) Abuse/Neglect Assessment Can Be Completed: Yes Physical Abuse: Denies Verbal Abuse: Denies Sexual Abuse: Denies Exploitation of patient/patient's resources: Denies Self-Neglect: Denies Values / Beliefs Cultural Requests During Hospitalization: None Spiritual Requests During Hospitalization: None Consults Spiritual Care Consult Needed: No Social Work Consult Needed: No Merchant navy officerAdvance Directives (For Healthcare) Does Patient Have a Medical Advance Directive?: No Would patient like information on creating a medical advance directive?: No - Patient declined          Disposition:  Disposition Initial Assessment Completed for this Encounter: Yes   NP Nira ConnJason [name] recommends the pt be observed overnight for safety and stabilization.  The pt is to be reassessed by psychiatry in the morning.  RN and MD were made aware of the recommendation.   On Site Evaluation by:   Reviewed with Physician:    [name], [name] 06/05/2018 10:05 PM

## 2018-06-05 NOTE — ED Triage Notes (Signed)
Pt slow to answer questions and very soft spoken.  Reports unable to sleep x 4 days.  Asked pt if he was suicidal and he states, "I don't know how to answer that.  I have thought about suicide in the past and tried to kill myself but it didn't work.  It really doesn't matter either way."

## 2018-06-05 NOTE — ED Notes (Signed)
Staffing trying to find a sitter

## 2018-06-05 NOTE — ED Notes (Signed)
  Patient moved into 049.  Paitent is agitated and verbally abusive to staff.  Sitter just arrived.

## 2018-06-05 NOTE — ED Provider Notes (Addendum)
[facility] EMERGENCY DEPARTMENT Provider Note   CSN: [id number] Arrival date & time: 06/05/18  2027     History   Chief Complaint Chief Complaint  Patient presents with  . Suicidal  . Insomnia    HPI [name] is a 23 y.o. male.  Patient states he has not been sleeping for 2 days.  States he has a history of bipolar.  Patient states he has been thinking of suicide as well.  No homicidal ideations.  Patient here voluntarily.     Past Medical History:  Diagnosis Date  . ADD (attention deficit disorder)   . 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 Anemia   . Anxiety   . Bipolar disorder (HCC)   . Borderline personality disorder (HCC)   . Central auditory processing disorder   . Deliberate self-cutting   . Depressed   . Eczema   . Headache(784.0)   . Oppositional defiant disorder   . Schizophrenia [facility])     Patient Active Problem List   Diagnosis Date Noted  . Substance induced mood disorder (HCC) 03/10/2018  . Paranoid schizophrenia (HCC)   . Cannabis use disorder, moderate, dependence (HCC) 06/05/2016  . Eating disorder Unspecified 05/22/2016  . Borderline personality disorder (HCC) 05/16/2016  . Tobacco use disorder 05/15/2016    Past Surgical History:  Procedure Laterality Date  . BACK SURGERY    . FRACTURE SURGERY    . NO PAST SURGERIES    . POSTERIOR LUMBAR FUSION N/A 02/19/2015   Procedure: LATERAL L-2 CORPECTOMY;  Surgeon: Lisbeth Renshaw, MD;  Location: [facility] OR;  Service: Neurosurgery;  Laterality: N/A;  . POSTERIOR LUMBAR FUSION 4 LEVEL  02/19/2015   Procedure: Posterior T-12 - L-4 STABILIZATION OF POSTERIOR LUMBAR;  Surgeon: Lisbeth Renshaw, MD;  Location: [facility] OR;  Service: Neurosurgery;;  . TIBIA IM NAIL INSERTION Left 02/20/2015   Procedure: INTRAMEDULLARY (IM) NAIL LEFT TIBIAL;  Surgeon: [name], MD;  Location: [facility] OR;  Service: Orthopedics;  Laterality: Left;         Home Medications    Prior to Admission medications   Not on File    Family History No family history on file.  Social History Social History   Tobacco Use  . Smoking status: Current Every Day Smoker    Packs/day: 2.00    Types: Cigarettes  . Smokeless tobacco: Never Used  . Tobacco comment: 1-3 packs   Substance Use Topics  . Alcohol use: Yes    Alcohol/week: 2.0 standard drinks    Types: 2 Shots of liquor per week  . Drug use: Yes    Types: Marijuana, Cocaine     Allergies   Clozapine; Prolixin [fluphenazine]; and Risperdal [risperidone]   Review of Systems Review of Systems  Constitutional: Negative for chills and fever.  HENT: Negative for rhinorrhea and sore throat.   Eyes: Negative for visual disturbance.  Respiratory: Negative for cough and shortness of breath.   Cardiovascular: Negative for chest pain and leg swelling.  Gastrointestinal: Negative for abdominal pain, diarrhea, nausea and vomiting.  Genitourinary: Negative for dysuria.  Musculoskeletal: Negative for back pain and neck pain.  Skin: Negative for rash.  Neurological: Negative for dizziness, light-headedness and headaches.  Hematological: Does not bruise/bleed easily.  Psychiatric/Behavioral: Positive for sleep disturbance and suicidal ideas. Negative for confusion.     Physical Exam Updated Vital Signs BP 119/86 (BP Location: Right Arm)   Pulse 75   Temp 98.2 F (36.8 C) (Oral)  Resp 18   SpO2 100%   Physical Exam Vitals signs and nursing note reviewed.  Constitutional:      Appearance: He is well-developed.  HENT:     Head: Normocephalic and atraumatic.     Mouth/Throat:     Mouth: Mucous membranes are moist.  Eyes:     Conjunctiva/sclera: Conjunctivae normal.  Neck:     Musculoskeletal: Neck supple.  Cardiovascular:     Rate and Rhythm: Normal rate and regular rhythm.     Heart sounds: No murmur.  Pulmonary:     Effort: Pulmonary effort is normal. No  respiratory distress.     Breath sounds: Normal breath sounds.  Abdominal:     Palpations: Abdomen is soft.     Tenderness: There is no abdominal tenderness.  Musculoskeletal: Normal range of motion.        General: No swelling.  Skin:    General: Skin is warm and dry.     Capillary Refill: Capillary refill takes less than 2 seconds.  Neurological:     General: No focal deficit present.     Mental Status: He is alert and oriented to person, place, and time.     Cranial Nerves: No cranial nerve deficit.     Sensory: No sensory deficit.     Motor: No weakness.  Psychiatric:        Behavior: Behavior normal.      ED Treatments / Results  Labs (all labs ordered are listed, but only abnormal results are displayed) Labs Reviewed  CBC - Abnormal; Notable for the following components:      Result Value   RBC 6.01 (*)    All other components within normal limits  COMPREHENSIVE METABOLIC PANEL  ETHANOL  SALICYLATE LEVEL  ACETAMINOPHEN LEVEL  RAPID URINE DRUG SCREEN, HOSP PERFORMED    EKG None  Radiology No results found.  Procedures Procedures (including critical care time)  Medications Ordered in ED Medications - No data to display   Initial Impression / Assessment and Plan / ED Course  I have reviewed the triage vital signs and the nursing notes.  Pertinent labs & imaging results that were available during my care of the patient were reviewed by me and considered in my medical decision making (see chart for details).    Patient here for medical clearance.  States that he is having some suicidal thoughts.  Also not sleeping.  Has a history of bipolar.  Patient cooperative here he is here voluntarily.  Patient does not come across as manic on exam.  We will clear him medically and then have behavioral health interview him.  Patient's labs are back.  He is medically cleared.  He has been evaluated by behavioral health they are planning to observe him overnight and  reassess the situation in the morning.   Final Clinical Impressions(s) / ED Diagnoses   Final diagnoses:  Insomnia, unspecified type  Suicidal ideation    ED Discharge Orders    None       Vanetta Mulders, MD 06/05/18 [id number]    Vanetta Mulders, MD 06/05/18 2230

## 2018-06-05 NOTE — ED Notes (Signed)
Patient states that he wants to go back to [place], but his mom keeps his money. States he just got out of jail 4-5 days ago and is living in a transitional home.

## 2018-06-06 ENCOUNTER — Other Ambulatory Visit: Payer: Self-pay

## 2018-06-06 NOTE — ED Notes (Addendum)
Pt was ambulatory to restroom.

## 2018-06-06 NOTE — Progress Notes (Signed)
Patient is seen by me via tele-psych and I have consulted with Dr. Lucianne MussKumar.  Patient denies any suicidal homicidal ideations and denies any hallucinations.  Patient reports that he came to the hospital simply because he had not had sleep for approximately 2 to 3 days.  He states that he knows that he has been diagnosed with bipolar and does have some minor manic episodes from time to time.  He was hoping to get something just for sleep but instead received Geodon while he was here.  Patient stated that that did help calm him down and he did sleep last night and today he feels much better.  Patient states that he is still current with [facility] and that he just saw them a few days ago.  He is requesting to be discharged and does not feel that he needs to be in the hospital and is hoping that someone will prescribe him some medications for sleep. Patient doe not meet inpatient criteria and is psychiatrically cleared I attempted to contact Burna FortsJeff Hedges PA-C, but he was unavailabe and the RN took the message.

## 2018-06-06 NOTE — ED Notes (Signed)
Report has been given by night shift. Pt is asleep currently.

## 2018-06-06 NOTE — Progress Notes (Addendum)
9:20AM: CSW met with patient. CSW noted patient reported he was staying at a place not that far away. CSW provided patient with a bus pass and encouraged him to follow up with his ACTT provider soon.  9:12AM: CSW received call back from patient's ACT[facility] provider. They stated they are unable to provide him transportation due to his history of going to ED rooms frequently. CSW following.  CSW was requested to contact patient's ACT[facility] provider for discharge transportation. CSW spoke with [facility] 24-hour staff and is awaiting a call back from the [place] provider. CSW continuing to follow.  Lamonte Richer, LCSW, [place] Worker II [phone]

## 2018-06-06 NOTE — ED Notes (Signed)
Pt did not want to consume breakfast meal.  Pt requested for wash cloth, deodorant, tooth paste and tooth brush, Pt was given toiletry items.

## 2018-06-06 NOTE — ED Notes (Addendum)
ALL belongings - 1 labeled belongings bag - returned to pt - Belongings were not inventoried - NO Valuables envelope noted - Pt verified all belongings present. Resources discussed and given. Bus pass given as per Aurelio Brash, SW, who advised pt's [facility] requested for ED to give pt bus pass - they were not able to come to ED to pick up pt.

## 2018-06-06 NOTE — ED Notes (Signed)
Ordered diet tray for pt  

## 2018-06-06 NOTE — ED Provider Notes (Addendum)
23 year old male with history of bipolar disorder presents for evaluation of sleep disturbances as well as passive SI.  Medically cleared 06/05/2018.  Patient evaluated by psychiatry on initial presentation and was recommended for reevaluation in the morning.   Patient reevaluated today, 06/06/2018.  Patient denies SI, HI, AVH.  Patient was psychiatrically cleared by psychiatry.  Patient does not appear to be responding to internal stimuli.  Thought process is coherent.  Discussed with patient follow-up resources. Patient voiced understanding and is agreeable for follow-up.   Social work has discussed with patient's Act team.  They are not able to provide transportation at this time.  Patient's ACT provider states he is able to transport on a bus and request bus pass be given to patient.  I have discussed with patient's legal guardian, Josephine Igo.  She agrees with this plan.  Patient stable for discharge at this time.   Kyden Potash A, PA-C 06/06/18 0932    Admiral Marcucci A, PA-C 06/06/18 1005    Alvira Monday, MD 06/07/18 [phone]

## 2018-06-06 NOTE — ED Notes (Signed)
Telepsych being performed. 

## 2018-06-09 ENCOUNTER — Other Ambulatory Visit: Payer: Self-pay

## 2018-06-09 ENCOUNTER — Encounter (HOSPITAL_COMMUNITY): Payer: Self-pay | Admitting: Emergency Medicine

## 2018-06-09 ENCOUNTER — Emergency Department (HOSPITAL_COMMUNITY)
Admission: EM | Admit: 2018-06-09 | Discharge: 2018-06-09 | Disposition: A | Discharge status: Home / Self Care | Payer: Medicaid Other | Attending: Emergency Medicine | Admitting: Emergency Medicine

## 2018-06-09 DIAGNOSIS — Z5321 Procedure and treatment not carried out due to patient leaving prior to being seen by health care provider: Secondary | ICD-10-CM | POA: Diagnosis not present

## 2018-06-09 DIAGNOSIS — R197 Diarrhea, unspecified: Secondary | ICD-10-CM | POA: Insufficient documentation

## 2018-06-09 DIAGNOSIS — R112 Nausea with vomiting, unspecified: Secondary | ICD-10-CM | POA: Insufficient documentation

## 2018-06-09 LAB — LIPASE, BLOOD: Lipase: 32 U/L (ref 11–51)

## 2018-06-09 LAB — COMPREHENSIVE METABOLIC PANEL
ALT: 28 U/L (ref 0–44)
AST: 41 U/L (ref 15–41)
Albumin: 4.3 g/dL (ref 3.5–5.0)
Alkaline Phosphatase: 79 U/L (ref 38–126)
Anion gap: 13 (ref 5–15)
BUN: 10 mg/dL (ref 6–20)
CO2: 22 mmol/L (ref 22–32)
Calcium: 9.7 mg/dL (ref 8.9–10.3)
Chloride: 107 mmol/L (ref 98–111)
Creatinine, Ser: 0.8 mg/dL (ref 0.61–1.24)
GFR calc Af Amer: >60 mL/min (ref >60)
GFR calc non Af Amer: >60 mL/min (ref >60)
Glucose, Bld: 81 mg/dL (ref 70–99)
Potassium: 5 mmol/L (ref 3.5–5.1)
Sodium: 142 mmol/L (ref 135–145)
Total Bilirubin: 0.7 mg/dL (ref 0.3–1.2)
Total Protein: 7.7 g/dL (ref 6.5–8.1)

## 2018-06-09 LAB — CBC
HCT: 54.8 % — ABNORMAL HIGH (ref 39.0–52.0)
Hemoglobin: 17.2 g/dL — ABNORMAL HIGH (ref 13.0–17.0)
MCH: 27 pg (ref 26.0–34.0)
MCHC: 31.4 g/dL (ref 30.0–36.0)
MCV: 86.2 fL (ref 80.0–100.0)
Platelets: 237 10*3/uL (ref 150–400)
RBC: 6.36 MIL/uL — ABNORMAL HIGH (ref 4.22–5.81)
RDW: 13.6 % (ref 11.5–15.5)
WBC: 4.4 10*3/uL (ref 4.0–10.5)
nRBC: 0 % (ref 0.0–0.2)

## 2018-06-09 NOTE — ED Notes (Signed)
Called for pt X3 with no response. NT in lobby states he has not seen pt in a while.

## 2018-06-09 NOTE — ED Triage Notes (Signed)
Onset one day ago continued today with nausea,emesis, and diarrhea loose stool with general abdominal pain 7/10 achy.

## 2018-06-24 ENCOUNTER — Ambulatory Visit (HOSPITAL_COMMUNITY)
Admission: RE | Admit: 2018-06-24 | Discharge: 2018-06-24 | Disposition: A | Discharge status: Home / Self Care | Payer: Medicaid Other | Attending: Psychiatry | Admitting: Psychiatry

## 2018-06-24 ENCOUNTER — Other Ambulatory Visit: Payer: Self-pay

## 2018-06-24 ENCOUNTER — Emergency Department (HOSPITAL_COMMUNITY)
Admission: EM | Admit: 2018-06-24 | Discharge: 2018-06-25 | Disposition: A | Discharge status: Home / Self Care | Payer: Medicaid Other | Attending: Emergency Medicine | Admitting: Emergency Medicine

## 2018-06-24 ENCOUNTER — Encounter (HOSPITAL_COMMUNITY): Payer: Self-pay | Admitting: Emergency Medicine

## 2018-06-24 DIAGNOSIS — F200 Paranoid schizophrenia: Secondary | ICD-10-CM | POA: Diagnosis not present

## 2018-06-24 DIAGNOSIS — F603 Borderline personality disorder: Secondary | ICD-10-CM | POA: Diagnosis not present

## 2018-06-24 DIAGNOSIS — F319 Bipolar disorder, unspecified: Secondary | ICD-10-CM | POA: Insufficient documentation

## 2018-06-24 DIAGNOSIS — F419 Anxiety disorder, unspecified: Secondary | ICD-10-CM | POA: Insufficient documentation

## 2018-06-24 DIAGNOSIS — F913 Oppositional defiant disorder: Secondary | ICD-10-CM | POA: Diagnosis not present

## 2018-06-24 DIAGNOSIS — F1220 Cannabis dependence, uncomplicated: Secondary | ICD-10-CM | POA: Insufficient documentation

## 2018-06-24 DIAGNOSIS — F902 Attention-deficit hyperactivity disorder, combined type: Secondary | ICD-10-CM | POA: Insufficient documentation

## 2018-06-24 DIAGNOSIS — F329 Major depressive disorder, single episode, unspecified: Secondary | ICD-10-CM | POA: Diagnosis present

## 2018-06-24 DIAGNOSIS — R45851 Suicidal ideations: Secondary | ICD-10-CM | POA: Insufficient documentation

## 2018-06-24 DIAGNOSIS — R4587 Impulsiveness: Secondary | ICD-10-CM | POA: Insufficient documentation

## 2018-06-24 DIAGNOSIS — F99 Mental disorder, not otherwise specified: Secondary | ICD-10-CM | POA: Diagnosis present

## 2018-06-24 DIAGNOSIS — F17210 Nicotine dependence, cigarettes, uncomplicated: Secondary | ICD-10-CM | POA: Diagnosis not present

## 2018-06-24 LAB — CBC
HCT: 46.6 % (ref 39.0–52.0)
Hemoglobin: 14.6 g/dL (ref 13.0–17.0)
MCH: 26.8 pg (ref 26.0–34.0)
MCHC: 31.3 g/dL (ref 30.0–36.0)
MCV: 85.5 fL (ref 80.0–100.0)
Platelets: 246 10*3/uL (ref 150–400)
RBC: 5.45 MIL/uL (ref 4.22–5.81)
RDW: 14 % (ref 11.5–15.5)
WBC: 5.1 10*3/uL (ref 4.0–10.5)
nRBC: 0 % (ref 0.0–0.2)

## 2018-06-24 LAB — COMPREHENSIVE METABOLIC PANEL
ALT: 17 U/L (ref 0–44)
AST: 21 U/L (ref 15–41)
Albumin: 4.4 g/dL (ref 3.5–5.0)
Alkaline Phosphatase: 74 U/L (ref 38–126)
Anion gap: 7 (ref 5–15)
BUN: 5 mg/dL — ABNORMAL LOW (ref 6–20)
CO2: 27 mmol/L (ref 22–32)
Calcium: 9.1 mg/dL (ref 8.9–10.3)
Chloride: 105 mmol/L (ref 98–111)
Creatinine, Ser: 0.62 mg/dL (ref 0.61–1.24)
GFR calc Af Amer: >60 mL/min (ref >60)
GFR calc non Af Amer: >60 mL/min (ref >60)
Glucose, Bld: 89 mg/dL (ref 70–99)
Potassium: 3.7 mmol/L (ref 3.5–5.1)
Sodium: 139 mmol/L (ref 135–145)
Total Bilirubin: 0.4 mg/dL (ref 0.3–1.2)
Total Protein: 7.3 g/dL (ref 6.5–8.1)

## 2018-06-24 LAB — SALICYLATE LEVEL: Salicylate Lvl: <7 mg/dL (ref 2.8–30.0)

## 2018-06-24 LAB — RAPID URINE DRUG SCREEN, HOSP PERFORMED
Amphetamines: NOT DETECTED
Barbiturates: NOT DETECTED
Benzodiazepines: NOT DETECTED
Cocaine: NOT DETECTED
Opiates: NOT DETECTED
Tetrahydrocannabinol: NOT DETECTED

## 2018-06-24 LAB — ETHANOL: Alcohol, Ethyl (B): <10 mg/dL (ref <10)

## 2018-06-24 LAB — ACETAMINOPHEN LEVEL: Acetaminophen (Tylenol), Serum: <10 ug/mL — ABNORMAL LOW (ref 10–30)

## 2018-06-24 MED ORDER — ACETAMINOPHEN 325 MG PO TABS: 650.0000 mg | ORAL_TABLET | ORAL | Status: DC | PRN

## 2018-06-24 MED ORDER — LORAZEPAM 1 MG PO TABS: 1.0000 mg | ORAL_TABLET | ORAL | Status: DC | PRN

## 2018-06-24 NOTE — H&P (Signed)
Behavioral Health Medical Screening Exam  [name] [name] is an 23 y.o. male patient presents as walk in at [facility] with complaints of suicidal ideation no definite plan a this time but unable to contract for safety.  States that he has no support system.  Patient also states that he does multiple drugs today couple different types of hours ago last time.  Patient is guarded; possible audio/visual hallucination but unable to determine; patient denies.    Total Time spent with patient: 30 minutes  Psychiatric Specialty Exam: Physical Exam  Vitals reviewed. Constitutional: He is oriented to person, place, and time.  Neck: Normal range of motion. Neck supple.  Respiratory: Effort normal.  Musculoskeletal: Normal range of motion.  Neurological: He is alert and oriented to person, place, and time.  Skin: Skin is warm and dry.  Psychiatric: His speech is normal. Cognition and memory are normal. He expresses impulsivity. He exhibits a depressed mood. He expresses suicidal ideation. He expresses suicidal plans.    ROS  Blood pressure 114/80, pulse 83, temperature 98.1 F (36.7 C), resp. rate 18, SpO2 100 %.There is no height or weight on file to calculate BMI.  General Appearance: Casual and Disheveled  Eye Contact:  Fair  Speech:  Clear and Coherent  Volume:  Decreased  Mood:  Depressed  Affect:  Depressed and Labile  Thought Process:  Coherent and Goal Directed  Orientation:  Full (Time, Place, and Person)  Thought Content:  Denies hallucinations but staring at corner like he may be looking at something  Suicidal Thoughts:  Yes.  without intent/plan  Homicidal Thoughts:  No  Memory:  Immediate;   Fair Recent;   Fair Remote;   Fair  Judgement:  Impaired  Insight:  Lacking  Psychomotor Activity:  Normal  Concentration: Concentration: Fair and Attention Span: Fair  Recall:  [employer] of Knowledge:Fair  Language: Good  Akathisia:  No  Handed:  Right  AIMS (if indicated):      Assets:  Communication Skills Desire for Improvement Housing  Sleep:       Musculoskeletal: Strength & Muscle Tone: within normal limits Gait & Station: normal Patient leans: N/A  Blood pressure 114/80, pulse 83, temperature 98.1 F (36.7 C), resp. rate 18, SpO2 100 %.  Recommendations:  Inpatient psychiatric treatment  Based on my evaluation the patient appears to have an emergency medical condition for which I recommend the patient be transferred to the emergency department for further evaluation.   , NP 06/24/2018, 2:16 PM

## 2018-06-24 NOTE — BH Assessment (Signed)
Assessment Note  [name] is an 23 y.o. male presenting voluntarily to [facility] for assessment. Patient presents with AMS and bizarre behavior. Patient is evasive about the reasons for him accessing [facility] stating "I'm concerned about my mental health. I don' think my diagnoses is right. I want to die. There is no purpose in life." Patient reports 2 prior suicide attempts. Once was in 2016 when jumped off a highway overpass. He also overdosed at 23 years old. Patient could not verbalize any specific plan. Patient reports using cocaine 2 days ago and smoking marijuana this morning. Patient further states he "accidentally"used heroin 2 days ago. He renders limited history about drug use. Patient denies HI/AVH. Patient states he does not have any natural supports other than the "[employer]" cult that he is part of in [place]. He states he came back to [place] because he had charges for trespassing that he had to serve time in jail for. He states he is currently in transitional housing and "does bad stuff" to earn money. Patient reports he sleeps 5 hours per night and has not eaten anything since yesterday, stating he is anorexic. At the end of interview patient reports that he has a [facility] but he does not want them to know he is here. It is unclear if the information provided this individual is accurate as he was not truthful during aspects of assessment and has history of delusional thinking.   Collateral information was obtained from patient's mother/ legal guardian [name] [phone]. When informed that patient was recommended for inpatient treatment, Joi as agitated as she believes Ante came to the hospital to retaliate against her for putting him into transitional housing. This clinician informed her that because he could not contract for safety we could not release him into the community and that he was being sent to [facility] ED for medical clearance. She reports he has a long history of  mental illness and has had numerous hospitalizations at [facility], [facility], and [facility]. She states "I don't want him coming to your facility. I will sue your hospital if you put him on any medications without consulting me first." She states he currently refuses to take any medications. He as previously on [name] and she states that was the only medication that works for him. Guardian verbalized understanding that patient is recommended for in patient treatment and that we do not know what facility he will be placed at. She asked to be notified of any updates.  Patient was alert and oriented x 4. Patient was noted to have poor hygiene. This thoughts were disorganized and he speech was slow. His mood depressed but affect was incongruent, smiling at points during assessment. His insight, judgement, and impulse control are poor. He did not appear to be responding to to internal stimuli at time of assessment however seemed to be delusional.  Diagnosis: F20.9 Schizophrenia (per history)  Past Medical History:  Past Medical History:  Diagnosis Date  . ADD (attention deficit disorder)   . 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 Anemia   . Anxiety   . Bipolar disorder (HCC)   . Borderline personality disorder (HCC)   . Central auditory processing disorder   . Deliberate self-cutting   . Depressed   . Eczema   . Headache(784.0)   . Oppositional defiant disorder   . Schizophrenia [facility])     Past Surgical History:  Procedure Laterality Date  . BACK SURGERY    .  FRACTURE SURGERY    . NO PAST SURGERIES    . POSTERIOR LUMBAR FUSION N/A 02/19/2015   Procedure: LATERAL L-2 CORPECTOMY;  Surgeon: Lisbeth Renshaw, MD;  Location: [facility] OR;  Service: Neurosurgery;  Laterality: N/A;  . POSTERIOR LUMBAR FUSION 4 LEVEL  02/19/2015   Procedure: Posterior T-12 - L-4 STABILIZATION OF POSTERIOR LUMBAR;  Surgeon: Lisbeth Renshaw, MD;  Location: [facility] OR;  Service: Neurosurgery;;   . TIBIA IM NAIL INSERTION Left 02/20/2015   Procedure: INTRAMEDULLARY (IM) NAIL LEFT TIBIAL;  Surgeon: [name], MD;  Location: [facility] OR;  Service: Orthopedics;  Laterality: Left;    Family History: No family history on file.  Social History:  reports that he has been smoking cigarettes. He has been smoking about 2.00 packs per day. He has never used smokeless tobacco. He reports current alcohol use of about 2.0 standard drinks of alcohol per week. He reports current drug use. Drugs: Cocaine and Marijuana.  Additional Social History:  Alcohol / Drug Use Pain Medications: see MAR Prescriptions: see MAR History of alcohol / drug use?: Yes Longest period of sobriety (when/how long): UTA Substance #1 Name of Substance 1: Cocaine 1 - Age of First Use: 19 1 - Amount (size/oz): 1-2 grams 1 - Frequency: weekly 1 - Duration: 3 years 1 - Last Use / Amount: 06/22/2018  CIWA: CIWA-Ar BP: 114/80 Pulse Rate: 83 COWS:    Allergies:  Allergies  Allergen Reactions  . Clozapine Hives and Other (See Comments)    Increased blood pressure  . Prolixin [Fluphenazine] Other (See Comments)    Hallucinations  . Risperdal [Risperidone] Other (See Comments)    Unknown    Home Medications: (Not in a hospital admission)   OB/GYN Status:  No LMP for male patient.  General Assessment Data TTS Assessment: In system Is this a Tele or Face-to-Face Assessment?: Face-to-Face Is this an Initial Assessment or a Re-assessment for this encounter?: Initial Assessment Patient Accompanied by:: N/A Language Other than English: No Living Arrangements: Other (Comment)(transitional housing) What gender do you identify as?: Male Marital status: Single Pregnancy Status: No Living Arrangements: Other (Comment)(transitional housing) Can pt return to current living arrangement?: Yes Admission Status: Voluntary Is patient capable of signing voluntary admission?: Yes Referral Source:  Self/Family/Friend Insurance type: Medicaid     Crisis Care Plan Living Arrangements: Other (Comment)(transitional housing) Legal Guardian: Mother Name of Psychiatrist: [facility] Name of Therapist: [facility]  Education Status Is patient currently in school?: Yes Current Grade: (for GED) Highest grade of school patient has completed: (11) Name of school: [employer] Contact person: none IEP information if applicable: none Is the patient employed, unemployed or receiving disability?: Unemployed  Risk to self with the past 6 months Suicidal Ideation: Yes-Currently Present Has patient been a risk to self within the past 6 months prior to admission? : No Suicidal Intent: Yes-Currently Present Has patient had any suicidal intent within the past 6 months prior to admission? : No Is patient at risk for suicide?: Yes Suicidal Plan?: No Has patient had any suicidal plan within the past 6 months prior to admission? : No Access to Means: No What has been your use of drugs/alcohol within the last 12 months?: cocaine, heroin, THC Previous Attempts/Gestures: Yes How many times?: 2 Other Self Harm Risks: none Triggers for Past Attempts: Unpredictable Intentional Self Injurious Behavior: Cutting Comment - Self Injurious Behavior: (history of cutting) Family Suicide History: No Recent stressful life event(s): Other (Comment)(wants to go back to [place]) Persecutory voices/beliefs?: No Depression:  Yes Depression Symptoms: Despondent, Insomnia, Tearfulness, Fatigue, Isolating, Guilt, Loss of interest in usual pleasures, Feeling worthless/self pity, Feeling angry/irritable Substance abuse history and/or treatment for substance abuse?: Yes Suicide prevention information given to non-admitted patients: Not applicable  Risk to Others within the past 6 months Homicidal Ideation: No Does patient have any lifetime risk of violence toward others beyond the six months prior to admission? :  No Thoughts of Harm to Others: No Current Homicidal Intent: No Current Homicidal Plan: No Access to Homicidal Means: No Identified Victim: none History of harm to others?: No Assessment of Violence: None Noted Violent Behavior Description: none noted Does patient have access to weapons?: No Criminal Charges Pending?: No Does patient have a court date: No Is patient on probation?: No  Psychosis Hallucinations: None noted Delusions: Unspecified  Mental Status Report Appearance/Hygiene: Disheveled, Body odor Eye Contact: Poor Motor Activity: Freedom of movement Speech: Slow Level of Consciousness: Alert Mood: Suspicious Affect: Depressed Anxiety Level: Minimal Judgement: Impaired Orientation: Person, Place Obsessive Compulsive Thoughts/Behaviors: None  Cognitive Functioning Concentration: Poor Memory: Recent Intact, Remote Intact Is patient IDD: No Insight: Poor Impulse Control: Poor Appetite: Poor Have you had any weight changes? : No Change Sleep: Decreased Total Hours of Sleep: 5 Vegetative Symptoms: Not bathing  ADLScreening [facility]) Patient's cognitive ability adequate to safely complete daily activities?: Yes Patient able to express need for assistance with ADLs?: Yes Independently performs ADLs?: Yes (appropriate for developmental age)  Prior Inpatient Therapy Prior Inpatient Therapy: Yes Prior Therapy Dates: 03/2018 [facility] Reason for Treatment: psychosis  Prior Outpatient Therapy Prior Outpatient Therapy: Yes Prior Therapy Dates: current Prior Therapy Facilty/Provider(s): [facility] Reason for Treatment: bipolar Does patient have an ACCT team?: Yes Does patient have Intensive In-House Services?  : No Does patient have [facility] services? : No Does patient have P4CC services?: No  ADL Screening (condition at time of admission) Patient's cognitive ability adequate to  safely complete daily activities?: Yes Is the patient deaf or have difficulty hearing?: No Does the patient have difficulty seeing, even when wearing glasses/contacts?: No Does the patient have difficulty concentrating, remembering, or making decisions?: No Patient able to express need for assistance with ADLs?: Yes Does the patient have difficulty dressing or bathing?: No Independently performs ADLs?: Yes (appropriate for developmental age) Does the patient have difficulty walking or climbing stairs?: No Weakness of Legs: None Weakness of Arms/Hands: None     Therapy Consults (therapy consults require a physician order) PT Evaluation Needed: No OT Evalulation Needed: No SLP Evaluation Needed: No Abuse/Neglect Assessment (Assessment to be complete while patient is alone) Physical Abuse: Denies Verbal Abuse: Denies Sexual Abuse: Denies Exploitation of patient/patient's resources: Denies Self-Neglect: Denies Values / Beliefs Cultural Requests During Hospitalization: None Spiritual Requests During Hospitalization: None Consults Spiritual Care Consult Needed: No Social Work Consult Needed: No Merchant navy officerAdvance Directives (For Healthcare) Does Patient Have a Medical Advance Directive?: No Would patient like information on creating a medical advance directive?: No - Patient declined          Disposition: Shuvon Rankin, NP recommends in patient treatment as patient is unable to contract for safety. [facility] to await placement. Disposition Initial Assessment Completed for this Encounter: Yes [facility] of Patient[facility] Type of inpatient treatment program: Adult Patient refused recommended treatment: No  On Site Evaluation by:   Reviewed with Physician:    Celedonio MiyamotoMeredith  Noemie Devivo 06/24/2018  3:02 PM

## 2018-06-24 NOTE — ED Notes (Signed)
Bed: NT61 Expected date:  Expected time:  Means of arrival:  Comments: [facility]

## 2018-06-24 NOTE — BH Assessment (Signed)
Patient's mother/ legal guardian [name] [phone]

## 2018-06-24 NOTE — ED Notes (Signed)
Pt alert and cooperative. Pt denies any pain or discomfort. Pt denies any SI, Hi or avh at this time. Pt resting quietly in bed. Pt safe, will continue to monitor.

## 2018-06-24 NOTE — ED Notes (Signed)
Bed: WA27 Expected date:  Expected time:  Means of arrival:  Comments: 

## 2018-06-24 NOTE — ED Provider Notes (Addendum)
[facility]-EMERGENCY DEPT Provider Note   CSN: [id number] Arrival date & time: 06/24/18  1455     History   Chief Complaint Chief Complaint  Patient presents with  . Suicidal    HPI [name] is a 23 y.o. male.  [age] y,o male with a PMH of Schizophrenia, ADHD, Bipolar presents to the ED with SI.  Patient was sent from across the street for medical clearance. He reports being currently living with roomates. According to chart counselor had contacted mother or legal guardian and she stated "he only went to hospital to retaliate against me". Patient denies any SI,HI, chest pain, shortness of breath or other complaints.      Past Medical History:  Diagnosis Date  . ADD (attention deficit disorder)   . 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 Anemia   . Anxiety   . Bipolar disorder (HCC)   . Borderline personality disorder (HCC)   . Central auditory processing disorder   . Deliberate self-cutting   . Depressed   . Eczema   . Headache(784.0)   . Oppositional defiant disorder   . Schizophrenia [facility](HCC)     Patient Active Problem List   Diagnosis Date Noted  . Substance induced mood disorder (HCC) 03/10/2018  . Paranoid schizophrenia (HCC)   . Cannabis use disorder, moderate, dependence (HCC) 06/05/2016  . Eating disorder Unspecified 05/22/2016  . Borderline personality disorder (HCC) 05/16/2016  . Tobacco use disorder 05/15/2016    Past Surgical History:  Procedure Laterality Date  . BACK SURGERY    . FRACTURE SURGERY    . NO PAST SURGERIES    . POSTERIOR LUMBAR FUSION N/A 02/19/2015   Procedure: LATERAL L-2 CORPECTOMY;  Surgeon: Lisbeth RenshawNeelesh Nundkumar, MD;  Location: [facility] OR;  Service: Neurosurgery;  Laterality: N/A;  . POSTERIOR LUMBAR FUSION 4 LEVEL  02/19/2015   Procedure: Posterior T-12 - L-4 STABILIZATION OF POSTERIOR LUMBAR;  Surgeon: Lisbeth RenshawNeelesh Nundkumar, MD;  Location: [facility] OR;  Service: Neurosurgery;;    . TIBIA IM NAIL INSERTION Left 02/20/2015   Procedure: INTRAMEDULLARY (IM) NAIL LEFT TIBIAL;  Surgeon: Samson FredericBrian Swinteck, MD;  Location: [facility] OR;  Service: Orthopedics;  Laterality: Left;        Home Medications    Prior to Admission medications   Not on File    Family History History reviewed. No pertinent family history.  Social History Social History   Tobacco Use  . Smoking status: Current Every Day Smoker    Packs/day: 2.00    Types: Cigarettes  . Smokeless tobacco: Never Used  . Tobacco comment: 1-3 packs   Substance Use Topics  . Alcohol use: Yes    Alcohol/week: 2.0 standard drinks    Types: 2 Shots of liquor per week  . Drug use: Yes    Types: Cocaine, Marijuana     Allergies   Clozapine; Prolixin [fluphenazine]; and Risperdal [risperidone]   Review of Systems Review of Systems  Constitutional: Negative for chills and fever.  HENT: Negative for ear pain and sore throat.   Eyes: Negative for pain and visual disturbance.  Respiratory: Negative for cough and shortness of breath.   Cardiovascular: Negative for chest pain and palpitations.  Gastrointestinal: Negative for abdominal pain and vomiting.  Genitourinary: Negative for dysuria and hematuria.  Musculoskeletal: Negative for arthralgias and back pain.  Skin: Negative for color change and rash.  Neurological: Negative for seizures and syncope.  Psychiatric/Behavioral: Negative for hallucinations, sleep disturbance and suicidal ideas. The patient  is not nervous/anxious.   All other systems reviewed and are negative.    Physical Exam Updated Vital Signs BP 121/70 (BP Location: Right Arm)   Pulse 60   Temp 98 F (36.7 C) (Oral)   Resp 16   Ht 5\' 8"  (1.727 m)   Wt 65.8 kg   SpO2 100%   BMI 22.05 kg/m   Physical Exam Vitals signs and nursing note reviewed.  Constitutional:      Appearance: He is well-developed.     Comments: Well appearing, sleeping in room.   HENT:     Head: Normocephalic  and atraumatic.  Eyes:     General: No scleral icterus.    Pupils: Pupils are equal, round, and reactive to light.  Neck:     Musculoskeletal: Normal range of motion.  Cardiovascular:     Heart sounds: Normal heart sounds.  Pulmonary:     Effort: Pulmonary effort is normal.     Breath sounds: Normal breath sounds. No wheezing.  Chest:     Chest wall: No tenderness.  Abdominal:     General: Bowel sounds are normal. There is no distension.     Palpations: Abdomen is soft.     Tenderness: There is no abdominal tenderness.  Musculoskeletal:        General: No tenderness or deformity.  Skin:    General: Skin is warm and dry.  Neurological:     Mental Status: He is alert and oriented to person, place, and time.  Psychiatric:        Mood and Affect: Affect is flat.      ED Treatments / Results  Labs (all labs ordered are listed, but only abnormal results are displayed) Labs Reviewed  COMPREHENSIVE METABOLIC PANEL - Abnormal; Notable for the following components:      Result Value   BUN 5 (*)    All other components within normal limits  ACETAMINOPHEN LEVEL - Abnormal; Notable for the following components:   Acetaminophen (Tylenol), Serum <10 (*)    All other components within normal limits  ETHANOL  SALICYLATE LEVEL  CBC  RAPID URINE DRUG SCREEN, HOSP PERFORMED    EKG None  Radiology No results found.  Procedures Procedures (including critical care time)  Medications Ordered in ED Medications - No data to display   Initial Impression / Assessment and Plan / ED Course  I have reviewed the triage vital signs and the nursing notes.  Pertinent labs & imaging results that were available during my care of the patient were reviewed by me and considered in my medical decision making (see chart for details).     Patient who cannot contract home for safety presents to the ED with flat affect for [facility] counseling. Patient denies any chest pain, shortness of breath,  abdominal pain or other complaints.  According to [facility] counselor note, they have tried to reach mother or legal guardian who states the patient only came into the ED to retaliate against her. CBC and CMP are unremarkable, UDS was negative for any substance.  Acetaminophen level is less than 10 ethanol level is less than 10, salicylate level is less than 7.  Vital signs are stable, patient is well-appearing at this time patient is medically clear for [facility] at follow-up who recommended patient get inpatient treatment.  Final Clinical Impressions(s) / ED Diagnoses   Final diagnoses:  Suicidal ideations    ED Discharge Orders    None       Claude Manges,  PA-C 06/24/18 1833    Claude Manges, PA-C 06/24/18 1923    Charlynne Pander, MD 06/24/18 [phone]

## 2018-06-24 NOTE — ED Triage Notes (Signed)
Pt is here because "I dont really want to be here anymore"  He has a flat affect and states "I really cant contract for safety."

## 2018-06-25 DIAGNOSIS — R45851 Suicidal ideations: Secondary | ICD-10-CM | POA: Insufficient documentation

## 2018-06-25 DIAGNOSIS — F200 Paranoid schizophrenia: Secondary | ICD-10-CM

## 2018-06-25 NOTE — Consult Note (Addendum)
[facility] Psych ED Discharge  06/25/2018 12:49 PM [name]  MRN:  [id number] Principal Problem: Paranoid schizophrenia [facility]) Discharge Diagnoses: Principal Problem:   Paranoid schizophrenia (HCC) Active Problems:   Borderline personality disorder (HCC)   Subjective: "I was depressed yesterday and now I feel better and I'm ready to go home".  MEQ:ASTMHDQ [name] is an 23 y.o. male with history of paranoid schizophrenia who presented voluntarily to [facility] for assessment yesterday. Patient was evasive about the reasons for assessing [facility] expressing concern about the correctness of his mental health diagnosis. Collateral information received from patient's mother who reported he has a long history of mental illness and has had numerous hospitalizations at [facility], [facility], and [facility]. Patient's mother also stated he had been on [name] previously and this was the only medication that worked for him. Patient also reported having a [facility] although he did not want them to be notified of his hospitalization.  On Evaluation Today: Patient sat calmly on his bed during interview, distant and somewhat withdrawn. He expressed a desire to go home as he no longer felt the same way he was yesterday. Patient was very vague in giving responses during interview. He reports he currently lives with room mates and is unemployed, receiving social security. He denied suicidal or homicidal ideation or thoughts today although he admitted a prior history of suicide attempt in 2016. He denied drug use at this time and UDS was negative for substance and BAL was <10. He also denied auditory or visual hallucinations and did not seem to be responding to internal stimuli. Pt is able to contract for safety upon discharge. [name] is his guardian and she was notified. Pt's ACT team was also notified and they came to pick him up upon discharge. Pt is psychiatrically clear.   Total Time spent with  patient: 30 minutes  Past Psychiatric History: Per chart review Schizophrenia, GAD, ADHD  Past Medical History:  Past Medical History:  Diagnosis Date  . ADD (attention deficit disorder)   . 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 Anemia   . Anxiety   . Bipolar disorder (HCC)   . Borderline personality disorder (HCC)   . Central auditory processing disorder   . Deliberate self-cutting   . Depressed   . Eczema   . Headache(784.0)   . Oppositional defiant disorder   . Schizophrenia [facility])     Past Surgical History:  Procedure Laterality Date  . BACK SURGERY    . FRACTURE SURGERY    . NO PAST SURGERIES    . POSTERIOR LUMBAR FUSION N/A 02/19/2015   Procedure: LATERAL L-2 CORPECTOMY;  Surgeon: Lisbeth Renshaw, MD;  Location: [facility] OR;  Service: Neurosurgery;  Laterality: N/A;  . POSTERIOR LUMBAR FUSION 4 LEVEL  02/19/2015   Procedure: Posterior T-12 - L-4 STABILIZATION OF POSTERIOR LUMBAR;  Surgeon: Lisbeth Renshaw, MD;  Location: [facility] OR;  Service: Neurosurgery;;  . TIBIA IM NAIL INSERTION Left 02/20/2015   Procedure: INTRAMEDULLARY (IM) NAIL LEFT TIBIAL;  Surgeon: [name], MD;  Location: [facility] OR;  Service: Orthopedics;  Laterality: Left;   Family History: History reviewed. No pertinent family history. Family Psychiatric  History: Pt denies Social History:  Social History   Substance and Sexual Activity  Alcohol Use Yes  . Alcohol/week: 2.0 standard drinks  . Types: 2 Shots of liquor per week     Social History   Substance and Sexual Activity  Drug Use Yes  .  Types: Cocaine, Marijuana    Social History   Socioeconomic History  . Marital status: Single    Spouse name: Not on file  . Number of children: Not on file  . Years of education: Not on file  . Highest education level: Not on file  Occupational History  . Occupation: Unemployed  Social Needs  . Financial resource strain: Not on file  . Food insecurity:    Worry: Not  on file    Inability: Not on file  . Transportation needs:    Medical: Not on file    Non-medical: Not on file  Tobacco Use  . Smoking status: Current Every Day Smoker    Packs/day: 2.00    Types: Cigarettes  . Smokeless tobacco: Never Used  . Tobacco comment: 1-3 packs   Substance and Sexual Activity  . Alcohol use: Yes    Alcohol/week: 2.0 standard drinks    Types: 2 Shots of liquor per week  . Drug use: Yes    Types: Cocaine, Marijuana  . Sexual activity: Yes    Birth control/protection: None    Comment: pt reluctant to answer questions and frequently stated " I dont know"  Lifestyle  . Physical activity:    Days per week: Not on file    Minutes per session: Not on file  . Stress: Not on file  Relationships  . Social connections:    Talks on phone: Not on file    Gets together: Not on file    Attends religious service: Not on file    Active member of club or organization: Not on file    Attends meetings of clubs or organizations: Not on file    Relationship status: Not on file  Other Topics Concern  . Not on file  Social History Narrative   ** Merged History Encounter **    Pt reported that he is unemployed, homeless as his mother is in rehab    Has this patient used any form of tobacco in the last 30 days? (Cigarettes, Smokeless Tobacco, Cigars, and/or Pipes) Prescription not provided because: Pt denies that he smokes  Current Medications: Current Facility-Administered Medications  Medication Dose Route Frequency Provider Last Rate Last Dose  . acetaminophen (TYLENOL) tablet 650 mg  650 mg Oral Q4H PRN Claude MangesSoto, Johana, PA-C      . LORazepam (ATIVAN) tablet 1 mg  1 mg Oral Q4H PRN Claude MangesSoto, Johana, PA-C       No current outpatient medications on file.   PTA Medications: (Not in a hospital admission)   Musculoskeletal: Strength & Muscle Tone: within normal limits Gait & Station: normal Patient leans: N/A  Psychiatric Specialty Exam: Physical Exam   Constitutional: He is oriented to person, place, and time. He appears well-developed and well-nourished.  HENT:  Head: Normocephalic.  Neck: Normal range of motion.  Respiratory: Effort normal.  Musculoskeletal: Normal range of motion.  Neurological: He is alert and oriented to person, place, and time.  Psychiatric: His speech is normal. Judgment and thought content normal. His affect is blunt. He is withdrawn. Thought content is not paranoid. Cognition and memory are normal. He expresses no homicidal and no suicidal ideation. He expresses no suicidal plans and no homicidal plans.    Review of Systems  Constitutional: Negative.   Psychiatric/Behavioral: Negative for depression, hallucinations, substance abuse and suicidal ideas. The patient is not nervous/anxious.     Blood pressure 110/73, pulse (!) 52, temperature 98.2 F (36.8 C), temperature source Oral, resp. rate  17, height 5\' 8"  (1.727 m), weight 65.8 kg, SpO2 100 %.Body mass index is 22.05 kg/m.  General Appearance: Fairly Groomed  Eye Contact:  Fair  Speech:  Normal Rate  Volume:  Normal  Mood:  Euthymic  Affect:  Non-Congruent and WITHDRAWN  Thought Process:  Coherent  Orientation:  Full (Time, Place, and Person)  Thought Content:  Logical  Suicidal Thoughts:  No  Homicidal Thoughts:  No  Memory:  Immediate;   Fair Recent;   Fair Remote;   Fair  Judgement:  Fair  Insight:  Fair  Psychomotor Activity:  Normal  Concentration:  Concentration: Fair and Attention Span: Fair  Recall:  Good  Fund of Knowledge:  Good  Language:  Good  Akathisia:  No  Handed:  Right  AIMS (if indicated):   N/A  Assets:  Communication Skills Housing Leisure Time Social Support  ADL's:  Intact  Cognition:  WNL  Sleep:   N/A     Demographic Factors:  Adolescent or young adult and Unemployed  Loss Factors: NA  Historical Factors: Prior suicide attempts  Risk Reduction Factors:   Sense of responsibility to family, Living with  another person, especially a relative, Positive social support and Positive therapeutic relationship  Continued Clinical Symptoms:  More than one psychiatric diagnosis  Cognitive Features That Contribute To Risk:  None    Suicide Risk:  Minimal: No identifiable suicidal ideation.  Patients presenting with no risk factors but with morbid ruminations; may be classified as minimal risk based on the severity of the depressive symptoms    Plan Of Care/Follow-up recommendations:  Activity:  As tolerated Diet:  Regular  Disposition and Treatment Plan: Paranoid schizophrenia (HCC) Take all medications as prescribed by outpatient provider. Keep all follow-up appointments as scheduled with your [facility].  Do not consume alcohol or use illegal drugs while on prescription medications. Report any adverse effects from your medications to your primary care provider promptly.  In the event of recurrent symptoms or worsening symptoms, call 911, a crisis hotline, or go to the nearest emergency department for evaluation.   Laveda Abbe, NP 06/25/2018, 12:49 PM   Patient seen face-to-face for psychiatric evaluation, chart reviewed and case discussed with the physician extender and developed treatment plan. Reviewed the information documented and agree with the treatment plan.  Juanetta Beets, DO 06/25/18 10:28 PM

## 2018-06-25 NOTE — Discharge Instructions (Signed)
For your behavioral health needs, you are advised to continue treatment with the [facility] Team: ° °     [facility] °     [address] °     [place], [place] [address] °     [phone] °

## 2018-06-25 NOTE — ED Notes (Signed)
Patient took a shower this morning and attended to his hygiene.  Patient denies SI, HI, or AVH.  Ate his breakfast.

## 2018-06-25 NOTE — BH Assessment (Addendum)
[facility] Assessment Progress Note  Per Juanetta Beets, DO, this pt does not require psychiatric hospitalization at this time.  Pt is to be discharged from [facility] with recommendation to continue treatment with the [facility].  This has been included in pt's discharge instructions.  Pt's nurse, Aram Beecham, has been notified.  At 12:08 this writer called pt's mother/legal guardian, [name] [phone]), to notify her of pt's disposition.  She agrees with decision to discharge pt.  I informed her that pt does not want [place] to contact his ACT Team.  Ms Glynda Jaeger then granted verbal consent, adding that it would be her preference for [facility] to come to [facility] to pick pt up.  If they refuse, she asks to be called back so that she can formulate an alternative plan, to which I agree.  At 12:11 I called [facility], notifying them of disposition, and asking for them to pick pt up.  They are uncertain about whether they can arrange this at this time, but agree to call me back promptly.  Return call is pending as of this writing.  Doylene Canning, MA Triage Specialist [phone]   Addendum:  Pt's ACT Team has pick up pt.  Ms Glynda Jaeger has been notified.  Doylene Canning, [place] Behavioral Health Coordinator [phone]

## 2018-06-29 ENCOUNTER — Encounter (HOSPITAL_COMMUNITY): Payer: Self-pay

## 2018-06-29 ENCOUNTER — Emergency Department (HOSPITAL_COMMUNITY)
Admission: EM | Admit: 2018-06-29 | Discharge: 2018-06-29 | Disposition: A | Discharge status: Home / Self Care | Payer: Medicaid Other | Attending: Emergency Medicine | Admitting: Emergency Medicine

## 2018-06-29 DIAGNOSIS — Z5321 Procedure and treatment not carried out due to patient leaving prior to being seen by health care provider: Secondary | ICD-10-CM | POA: Insufficient documentation

## 2018-06-29 DIAGNOSIS — M549 Dorsalgia, unspecified: Secondary | ICD-10-CM | POA: Diagnosis present

## 2018-06-29 NOTE — ED Notes (Signed)
Pt called,no answer.

## 2018-06-29 NOTE — ED Triage Notes (Signed)
Unable to locate for room x1

## 2018-06-29 NOTE — ED Triage Notes (Addendum)
Pt reports chronic back pain after injury from suicide attempt. Denies SI today, asked him twice. Pt does have very flat affect in triage.

## 2018-07-01 ENCOUNTER — Emergency Department (HOSPITAL_COMMUNITY)
Admission: EM | Admit: 2018-07-01 | Discharge: 2018-07-01 | Discharge status: Against Medical Advice | Payer: Medicaid Other | Attending: Emergency Medicine | Admitting: Emergency Medicine

## 2018-07-01 ENCOUNTER — Encounter (HOSPITAL_COMMUNITY): Payer: Self-pay | Admitting: *Deleted

## 2018-07-01 DIAGNOSIS — R05 Cough: Secondary | ICD-10-CM | POA: Diagnosis present

## 2018-07-01 DIAGNOSIS — Z5321 Procedure and treatment not carried out due to patient leaving prior to being seen by health care provider: Secondary | ICD-10-CM | POA: Diagnosis not present

## 2018-07-01 NOTE — ED Notes (Signed)
Called for room, no answer

## 2018-07-01 NOTE — ED Triage Notes (Signed)
Pt in c/o cough and congestion for the last week

## 2018-07-01 NOTE — ED Notes (Signed)
Pt called for the second time, no answer. Nurse notified

## 2018-11-13 ENCOUNTER — Emergency Department (HOSPITAL_COMMUNITY)
Admission: EM | Admit: 2018-11-13 | Discharge: 2018-11-14 | Disposition: A | Discharge status: Home / Self Care | Payer: Medicaid Other | Attending: Emergency Medicine | Admitting: Emergency Medicine

## 2018-11-13 ENCOUNTER — Other Ambulatory Visit: Payer: Self-pay

## 2018-11-13 DIAGNOSIS — R45851 Suicidal ideations: Secondary | ICD-10-CM | POA: Insufficient documentation

## 2018-11-13 DIAGNOSIS — F209 Schizophrenia, unspecified: Secondary | ICD-10-CM | POA: Diagnosis not present

## 2018-11-13 DIAGNOSIS — F603 Borderline personality disorder: Secondary | ICD-10-CM | POA: Diagnosis present

## 2018-11-13 DIAGNOSIS — F17210 Nicotine dependence, cigarettes, uncomplicated: Secondary | ICD-10-CM | POA: Insufficient documentation

## 2018-11-13 DIAGNOSIS — F319 Bipolar disorder, unspecified: Secondary | ICD-10-CM | POA: Insufficient documentation

## 2018-11-13 DIAGNOSIS — F200 Paranoid schizophrenia: Secondary | ICD-10-CM | POA: Diagnosis present

## 2018-11-13 DIAGNOSIS — Z046 Encounter for general psychiatric examination, requested by authority: Secondary | ICD-10-CM | POA: Diagnosis present

## 2018-11-13 NOTE — ED Notes (Signed)
Grandad's phone number [phone]

## 2018-11-14 ENCOUNTER — Encounter (HOSPITAL_COMMUNITY): Payer: Self-pay | Admitting: *Deleted

## 2018-11-14 ENCOUNTER — Other Ambulatory Visit: Payer: Self-pay

## 2018-11-14 DIAGNOSIS — F603 Borderline personality disorder: Secondary | ICD-10-CM

## 2018-11-14 DIAGNOSIS — R45851 Suicidal ideations: Secondary | ICD-10-CM

## 2018-11-14 DIAGNOSIS — F200 Paranoid schizophrenia: Secondary | ICD-10-CM

## 2018-11-14 LAB — CBC
HCT: 49.3 % (ref 39.0–52.0)
Hemoglobin: 15.9 g/dL (ref 13.0–17.0)
MCH: 27.7 pg (ref 26.0–34.0)
MCHC: 32.3 g/dL (ref 30.0–36.0)
MCV: 86 fL (ref 80.0–100.0)
Platelets: 253 10*3/uL (ref 150–400)
RBC: 5.73 MIL/uL (ref 4.22–5.81)
RDW: 12.8 % (ref 11.5–15.5)
WBC: 5.7 10*3/uL (ref 4.0–10.5)
nRBC: 0 % (ref 0.0–0.2)

## 2018-11-14 LAB — RAPID URINE DRUG SCREEN, HOSP PERFORMED
Amphetamines: NOT DETECTED
Barbiturates: NOT DETECTED
Benzodiazepines: NOT DETECTED
Cocaine: NOT DETECTED
Opiates: NOT DETECTED
Tetrahydrocannabinol: NOT DETECTED

## 2018-11-14 LAB — COMPREHENSIVE METABOLIC PANEL
ALT: 18 U/L (ref 0–44)
AST: 26 U/L (ref 15–41)
Albumin: 4.3 g/dL (ref 3.5–5.0)
Alkaline Phosphatase: 73 U/L (ref 38–126)
Anion gap: 10 (ref 5–15)
BUN: 14 mg/dL (ref 6–20)
CO2: 26 mmol/L (ref 22–32)
Calcium: 9.4 mg/dL (ref 8.9–10.3)
Chloride: 104 mmol/L (ref 98–111)
Creatinine, Ser: 0.86 mg/dL (ref 0.61–1.24)
GFR calc Af Amer: >60 mL/min (ref >60)
GFR calc non Af Amer: >60 mL/min (ref >60)
Glucose, Bld: 86 mg/dL (ref 70–99)
Potassium: 3.8 mmol/L (ref 3.5–5.1)
Sodium: 140 mmol/L (ref 135–145)
Total Bilirubin: 0.5 mg/dL (ref 0.3–1.2)
Total Protein: 7.5 g/dL (ref 6.5–8.1)

## 2018-11-14 LAB — ACETAMINOPHEN LEVEL: Acetaminophen (Tylenol), Serum: <10 ug/mL — ABNORMAL LOW (ref 10–30)

## 2018-11-14 LAB — ETHANOL: Alcohol, Ethyl (B): <10 mg/dL (ref <10)

## 2018-11-14 LAB — SALICYLATE LEVEL: Salicylate Lvl: <7 mg/dL (ref 2.8–30.0)

## 2018-11-14 NOTE — ED Triage Notes (Addendum)
Pt states "I have thoughts of violence but I wouldn't hurt anyone." When asked if pt is SI, pt states "it's against my religion to harm myself so I wouldn't act on it." Pt appears depressed, has been to [facility] in the past. Belongings placed in [address]; pt has been dressed in scrubs and wanded by security. Last used cocaine, heroin, and marijuana 2 days ago

## 2018-11-14 NOTE — ED Notes (Signed)
Pt given discharge instructions, follow up information and the opportunity to ask questions. Pt also given outpatient resources for suicide crisis hotline. Pt verbalized understanding. Pt given belongings back from locker #8. Pt discharged from the ED without incident.

## 2018-11-14 NOTE — ED Notes (Addendum)
Patient belongings inventoried and placed in locker #8-Monique,RN
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
## 2018-11-14 NOTE — ED Provider Notes (Signed)
23 year old male evaluated by behavioral health who has been in contact with patient's act team.  Act team is aware of pending discharge and will follow up with patient.  Patient is aware that he needs to contact his act team.  Patient has been cleared by behavioral health for discharge.  Patient is aware of his plan of care.   Tacy Learn, PA-C 11/14/18 1145    Tegeler, Gwenyth Allegra, MD 11/14/18 [phone]

## 2018-11-14 NOTE — ED Notes (Signed)
Per [facility] ACT team aware of discharge.

## 2018-11-14 NOTE — [facility] Counselor (Signed)
Pt reported, he does not have a legal guardian. Clinician suggested he update his chart with registration to reflect being his own guardian. Pt declined, when clinician asked if she could contact his mother or other family, friends supports to obtain collateral information.   Vertell Novak, [place], [facility], [facility] Triage Specialist [phone]
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
## 2018-11-14 NOTE — ED Provider Notes (Signed)
[facility] EMERGENCY [facility]PART[facility] Provider Note   CSN: [id number] Arrival date & time: 11/13/18  2309    History   Chief Complaint Chief Complaint  Patient presents with  . Medical Clearance    HPI Jason FilaMalcolm Legrand Robertson is a 23 y.o. male past 1 history of ADD, ADHD, bipolar who presents for evaluation of suicidal ideation.  Patient initially arrived to the ED complaining of violent thoughts.  On my evaluation, he did endorse suicidal ideations.  He said that he has had these thoughts for the last few days but does not elaborate.  He states that he has thought of a specific way of how he would kill himself but he states "I do want to get into that with you."  He does have a history of cutting himself but states he has not cut himself recently.  He also endorses homicidal ideations.  He states that this stems from "being angry."  He states that he does not want to tell me who they are directed to and how he would do it.  He denies any auditory visual hallucinations.  He does endorse using cocaine, heroin and marijuana 2 days ago.  Also endorses alcohol use earlier this morning.  Denies any chest pain, difficulty breathing.     The history is provided by the patient.    Past Medical History:  Diagnosis Date  . ADD (attention deficit disorder)   . 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 Anemia   . Anxiety   . Bipolar disorder (HCC)   . Borderline personality disorder (HCC)   . Central auditory processing disorder   . Deliberate self-cutting   . Depressed   . Eczema   . Headache(784.0)   . Oppositional defiant disorder   . Schizophrenia [facility](HCC)     Patient Active Problem List   Diagnosis Date Noted  . Suicidal ideations   . Substance induced mood disorder (HCC) 03/10/2018  . Paranoid schizophrenia (HCC)   . Cannabis use disorder, moderate, dependence (HCC) 06/05/2016  . Eating disorder Unspecified 05/22/2016  . Borderline  personality disorder (HCC) 05/16/2016  . Tobacco use disorder 05/15/2016    Past Surgical History:  Procedure Laterality Date  . BACK SURGERY    . FRACTURE SURGERY    . NO PAST SURGERIES    . POSTERIOR LUMBAR FUSION N/A 02/19/2015   Procedure: LATERAL L-2 CORPECTOMY;  Surgeon: Lisbeth RenshawNeelesh Nundkumar, MD;  Location: [facility] OR;  Service: Neurosurgery;  Laterality: N/A;  . POSTERIOR LUMBAR FUSION 4 LEVEL  02/19/2015   Procedure: Posterior T-12 - L-4 STABILIZATION OF POSTERIOR LUMBAR;  Surgeon: Lisbeth RenshawNeelesh Nundkumar, MD;  Location: [facility] OR;  Service: Neurosurgery;;  . TIBIA IM NAIL INSERTION Left 02/20/2015   Procedure: INTRAMEDULLARY (IM) NAIL LEFT TIBIAL;  Surgeon: Samson FredericBrian Swinteck, MD;  Location: [facility] OR;  Service: Orthopedics;  Laterality: Left;        Home Medications    Prior to Admission medications   Not on File    Family History No family history on file.  Social History Social History   Tobacco Use  . Smoking status: Current Every Day Smoker    Packs/day: 2.00    Types: Cigarettes  . Smokeless tobacco: Never Used  . Tobacco comment: 1-3 packs   Substance Use Topics  . Alcohol use: Yes    Alcohol/week: 2.0 standard drinks    Types: 2 Shots of liquor per week  . Drug use: Yes    Types: Cocaine, Marijuana  Allergies   Clozapine, Prolixin [fluphenazine], and Risperdal [risperidone]   Review of Systems Review of Systems  Respiratory: Negative for shortness of breath.   Cardiovascular: Negative for chest pain.  Gastrointestinal: Negative for abdominal pain.  Psychiatric/Behavioral: Positive for agitation and suicidal ideas. Negative for hallucinations and self-injury.  All other systems reviewed and are negative.    Physical Exam Updated Vital Signs BP (!) 115/43 (BP Location: Left Arm)   Pulse (!) 47   Temp 98.1 F (36.7 C) (Oral)   Resp 16   SpO2 100%   Physical Exam Vitals signs and nursing note reviewed.  Constitutional:      Appearance: Normal appearance.  He is well-developed.  HENT:     Head: Normocephalic and atraumatic.  Eyes:     General: Lids are normal.     Conjunctiva/sclera: Conjunctivae normal.     Pupils: Pupils are equal, round, and reactive to light.  Neck:     Musculoskeletal: Full passive range of motion without pain.  Cardiovascular:     Rate and Rhythm: Normal rate and regular rhythm.     Pulses: Normal pulses.     Heart sounds: Normal heart sounds. No murmur. No friction rub. No gallop.   Pulmonary:     Effort: Pulmonary effort is normal.     Breath sounds: Normal breath sounds.     Comments: Lungs clear to auscultation bilaterally.  Symmetric chest rise.  No wheezing, rales, rhonchi. Abdominal:     Palpations: Abdomen is soft. Abdomen is not rigid.     Tenderness: There is no abdominal tenderness. There is no guarding.     Comments: Abdomen is soft, non-distended, non-tender. No rigidity, No guarding. No peritoneal signs.  Musculoskeletal: Normal range of motion.  Skin:    General: Skin is warm and dry.     Capillary Refill: Capillary refill takes less than 2 seconds.     Comments: Old cut scars noted to LUE  Neurological:     Mental Status: He is alert and oriented to person, place, and time.  Psychiatric:        Speech: Speech normal.        Thought Content: Thought content includes homicidal and suicidal ideation. Thought content does not include homicidal or suicidal plan.     Comments: Poor eye contact throughout exam.  Minimal answers to questions and does not elaborate.       ED Treatments / Results  Labs (all labs ordered are listed, but only abnormal results are displayed) Labs Reviewed  ACETAMINOPHEN LEVEL - Abnormal; Notable for the following components:      Result Value   Acetaminophen (Tylenol), Serum <10 (*)    All other components within normal limits  COMPREHENSIVE METABOLIC PANEL  ETHANOL  CBC  RAPID URINE DRUG SCREEN, HOSP PERFORMED  SALICYLATE LEVEL    EKG None  Radiology No  results found.  Procedures Procedures (including critical care time)  Medications Ordered in ED Medications - No data to display   Initial Impression / Assessment and Plan / ED Course  I have reviewed the triage vital signs and the nursing notes.  Pertinent labs & imaging results that were available during my care of the patient were reviewed by me and considered in my medical decision making (see chart for details).        23 year old male who presents for evaluation of violent thoughts and possible suicidal ideations.  Initially when he checked in, he complained of violent thoughts but had denied  any SI.  When I talked to him, he does report positive SI.  He does not tell me specifically what he thought or what his specific plan was.  He also reports HI.  Patient states he has a history of self-harm and states that he has cut before.  He also reports he has used cocaine, heroin and marijuana.  Denies any alcohol use.  He is a very poor historian does not engage or interact with me very easily.  I am unable to get answers to a lot of questions.  Plan for medical clearance labs, TTS consultation.  Acetaminophen, ethanol, salicylate level unremarkable.  CBC without any significant leukocytosis or anemia.  CMP is unremarkable.  UDS is negative for any acute drugs.  Discussed  with tray from behavioral health.  Patient does not meet inpatient criteria at this time.  I did discuss with tray that given my inability to ascertain a accurate history and his expressing suicidal and homicidal ideation, I felt that there was an patient's best interest and safety to be observed overnight with reassessment in the morning.  Tray is in agreement to plan and will pass it on to morning team.  Patient is voluntary at this time.  Portions of this note were generated with Scientist, clinical (histocompatibility and immunogenetics)Dragon dictation software. Dictation errors may occur despite best attempts at proofreading.    Final Clinical Impressions(s) / ED  Diagnoses   Final diagnoses:  Suicidal ideation    ED Discharge Orders    None       Rosana HoesLayden, Lindsey A, PA-C 11/14/18 2002    Dione BoozeGlick, David, MD 11/14/18 2236

## 2018-11-14 NOTE — Discharge Instructions (Addendum)
Follow-up with your ACT team, they are aware of your visit to the emergency room today and awaiting your call.

## 2018-11-16 ENCOUNTER — Emergency Department (HOSPITAL_COMMUNITY): Payer: Medicaid Other

## 2018-11-16 ENCOUNTER — Emergency Department (HOSPITAL_COMMUNITY)
Admission: EM | Admit: 2018-11-16 | Discharge: 2018-11-16 | Disposition: A | Discharge status: Home / Self Care | Payer: Medicaid Other | Attending: Emergency Medicine | Admitting: Emergency Medicine

## 2018-11-16 ENCOUNTER — Other Ambulatory Visit: Payer: Self-pay

## 2018-11-16 ENCOUNTER — Encounter (HOSPITAL_COMMUNITY): Payer: Self-pay | Admitting: Emergency Medicine

## 2018-11-16 DIAGNOSIS — X58XXXA Exposure to other specified factors, initial encounter: Secondary | ICD-10-CM | POA: Insufficient documentation

## 2018-11-16 DIAGNOSIS — Y929 Unspecified place or not applicable: Secondary | ICD-10-CM | POA: Insufficient documentation

## 2018-11-16 DIAGNOSIS — Y999 Unspecified external cause status: Secondary | ICD-10-CM | POA: Diagnosis not present

## 2018-11-16 DIAGNOSIS — S6991XA Unspecified injury of right wrist, hand and finger(s), initial encounter: Secondary | ICD-10-CM | POA: Diagnosis present

## 2018-11-16 DIAGNOSIS — F17210 Nicotine dependence, cigarettes, uncomplicated: Secondary | ICD-10-CM | POA: Diagnosis not present

## 2018-11-16 DIAGNOSIS — Y939 Activity, unspecified: Secondary | ICD-10-CM | POA: Diagnosis not present

## 2018-11-16 DIAGNOSIS — S60511A Abrasion of right hand, initial encounter: Secondary | ICD-10-CM | POA: Insufficient documentation

## 2018-11-16 DIAGNOSIS — T148XXA Other injury of unspecified body region, initial encounter: Secondary | ICD-10-CM

## 2018-11-16 LAB — ETHANOL: Alcohol, Ethyl (B): <10 mg/dL (ref <10)

## 2018-11-16 LAB — CBC WITH DIFFERENTIAL/PLATELET
Abs Immature Granulocytes: 0.01 10*3/uL (ref 0.00–0.07)
Basophils Absolute: 0 10*3/uL (ref 0.0–0.1)
Basophils Relative: 1 %
Eosinophils Absolute: 0.1 10*3/uL (ref 0.0–0.5)
Eosinophils Relative: 1 %
HCT: 47.3 % (ref 39.0–52.0)
Hemoglobin: 15.3 g/dL (ref 13.0–17.0)
Immature Granulocytes: 0 %
Lymphocytes Relative: 53 %
Lymphs Abs: 3.4 10*3/uL (ref 0.7–4.0)
MCH: 27.9 pg (ref 26.0–34.0)
MCHC: 32.3 g/dL (ref 30.0–36.0)
MCV: 86.2 fL (ref 80.0–100.0)
Monocytes Absolute: 0.3 10*3/uL (ref 0.1–1.0)
Monocytes Relative: 5 %
Neutro Abs: 2.6 10*3/uL (ref 1.7–7.7)
Neutrophils Relative %: 40 %
Platelets: 211 10*3/uL (ref 150–400)
RBC: 5.49 MIL/uL (ref 4.22–5.81)
RDW: 12.8 % (ref 11.5–15.5)
WBC: 6.5 10*3/uL (ref 4.0–10.5)
nRBC: 0 % (ref 0.0–0.2)

## 2018-11-16 LAB — BASIC METABOLIC PANEL
Anion gap: 13 (ref 5–15)
BUN: 13 mg/dL (ref 6–20)
CO2: 23 mmol/L (ref 22–32)
Calcium: 9.4 mg/dL (ref 8.9–10.3)
Chloride: 106 mmol/L (ref 98–111)
Creatinine, Ser: 0.91 mg/dL (ref 0.61–1.24)
GFR calc Af Amer: >60 mL/min (ref >60)
GFR calc non Af Amer: >60 mL/min (ref >60)
Glucose, Bld: 97 mg/dL (ref 70–99)
Potassium: 4.4 mmol/L (ref 3.5–5.1)
Sodium: 142 mmol/L (ref 135–145)

## 2018-11-16 LAB — ACETAMINOPHEN LEVEL: Acetaminophen (Tylenol), Serum: <10 ug/mL — ABNORMAL LOW (ref 10–30)

## 2018-11-16 LAB — SALICYLATE LEVEL: Salicylate Lvl: <7 mg/dL (ref 2.8–30.0)

## 2018-11-16 MED ORDER — BACITRACIN ZINC 500 UNIT/GM EX OINT: TOPICAL_OINTMENT | Freq: Once | CUTANEOUS | Status: DC

## 2018-11-16 NOTE — ED Notes (Signed)
Patient transported to X-ray 

## 2018-11-16 NOTE — ED Notes (Signed)
Pt requesting to leave. PA made aware. Pt ignoring this RN and refusing to go back into room.

## 2018-11-16 NOTE — Discharge Instructions (Addendum)
By Neosporin or bacitracin 2 times a day.  Keep wounds clean and dry with soap and water.  Make sure that the wounds are dry before applying any bandages.  Return the emergency department for any fevers, redness or swelling around the wounds, difficulty moving hand or any other worsening or concerning symptoms.

## 2018-11-16 NOTE — ED Notes (Signed)
Pt given discharge paperwork.

## 2018-11-16 NOTE — ED Provider Notes (Signed)
07:00: Assumed care of patient from Providence Lanius PA-C @ change of shift pending [facility] assessment.  Please see prior provider note for full H&P.   Patient here with hand injury, does not report that this was self inflicted. He requested to speak with [facility] initially, but did not elaborate to ED team as far as why he wanted to speak with them. When initial provided asked about any SI or HI, he replied "I have thoughts about a lot of things," but he will not specify.  Was seen here 2 days ago for evaluation of possible SI.  Behavioral health team cleared him and he was discharged home.   While waiting for [facility] patient asked nursing staff to leave.  I personally evaluated & discussed with patient- he is alert & oriented x 4. Appropriately conversational. Adamantly denies SI, HI, or hallucinations. Does not appear to be responding to internal stimuli. No indication for IVC currently. Recent [facility] eval & was cleared. Will discharge home per prior providers prepared discharge instructions at this time. I discussed results, treatment plan, need for follow-up, and return precautions with the patient. Provided opportunity for questions, patient confirmed understanding and is in agreement with plan.     Amaryllis Dyke, PA-C 11/16/18 3149    Carmin Muskrat, MD 11/17/18 [phone]

## 2018-11-16 NOTE — ED Provider Notes (Signed)
[facility]   CSN: [id number] Arrival date & time: 11/16/18  0042    History   Chief Complaint Chief Complaint  Patient presents with  . Hand Injury    HPI Jason FilaMalcolm Legrand Robertson is a 23 y.o. male history of ADD, bipolar, borderline personality disorder, depression, schizophrenia who presents for evaluation of injury to right hand.  Patient states that he does not know how the injury happened.  He states that happened today.  He thinks he may have cut it on some metal.  He does not tell me anything else about the nature of how this injury occurred.  Patient states that he has not taken any medications for symptoms.  Patient does have a history of cutting several years ago.  He does not answer me when I ask if he cut himself.  He replies "I have many thoughts about a lot of things" I asked him about suicidal or homicidal ideations.  He denies any numbness/weakness.  He does not know when his last tetanus shot was.  He states that he does not take vaccinations he does not wish to have a tetanus shot today.     The history is provided by the patient.    Past Medical History:  Diagnosis Date  . ADD (attention deficit disorder)   . 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 Anemia   . Anxiety   . Bipolar disorder (HCC)   . Borderline personality disorder (HCC)   . Central auditory processing disorder   . Deliberate self-cutting   . Depressed   . Eczema   . Headache(784.0)   . Oppositional defiant disorder   . Schizophrenia [facility](HCC)     Patient Active Problem List   Diagnosis Date Noted  . Suicidal ideations   . Substance induced mood disorder (HCC) 03/10/2018  . Paranoid schizophrenia (HCC)   . Cannabis use disorder, moderate, dependence (HCC) 06/05/2016  . Eating disorder Unspecified 05/22/2016  . Borderline personality disorder (HCC) 05/16/2016  . Tobacco use disorder 05/15/2016    Past  Surgical History:  Procedure Laterality Date  . BACK SURGERY    . FRACTURE SURGERY    . NO PAST SURGERIES    . POSTERIOR LUMBAR FUSION N/A 02/19/2015   Procedure: LATERAL L-2 CORPECTOMY;  Surgeon: Lisbeth RenshawNeelesh Nundkumar, MD;  Location: [facility] OR;  Service: Neurosurgery;  Laterality: N/A;  . POSTERIOR LUMBAR FUSION 4 LEVEL  02/19/2015   Procedure: Posterior T-12 - L-4 STABILIZATION OF POSTERIOR LUMBAR;  Surgeon: Lisbeth RenshawNeelesh Nundkumar, MD;  Location: [facility] OR;  Service: Neurosurgery;;  . TIBIA IM NAIL INSERTION Left 02/20/2015   Procedure: INTRAMEDULLARY (IM) NAIL LEFT TIBIAL;  Surgeon: Samson FredericBrian Swinteck, MD;  Location: [facility] OR;  Service: Orthopedics;  Laterality: Left;        Home Medications    Prior to Admission medications   Not on File    Family History No family history on file.  Social History Social History   Tobacco Use  . Smoking status: Current Every Day Smoker    Packs/day: 2.00    Types: Cigarettes  . Smokeless tobacco: Never Used  . Tobacco comment: 1-3 packs   Substance Use Topics  . Alcohol use: Yes    Alcohol/week: 2.0 standard drinks    Types: 2 Shots of liquor per week  . Drug use: Yes    Types: Cocaine, Marijuana    Comment: heroin     Allergies   Clozapine, Prolixin [fluphenazine], and  Risperdal [risperidone]   Review of Systems Review of Systems  Constitutional: Negative for fever.  Skin: Positive for wound.  Neurological: Negative for weakness and numbness.  All other systems reviewed and are negative.    Physical Exam Updated Vital Signs BP (!) 99/49   Pulse (!) 45   Temp 98.1 F (36.7 C) (Oral)   Resp 16   Ht 5\' 8"  (1.727 m)   SpO2 99%   BMI 22.05 kg/m   Physical Exam Vitals signs and nursing note reviewed.  Constitutional:      Appearance: He is well-developed.  HENT:     Head: Normocephalic and atraumatic.  Eyes:     General: No scleral icterus.       Right eye: No discharge.        Left eye: No discharge.     Conjunctiva/sclera:  Conjunctivae normal.  Cardiovascular:     Pulses:          Radial pulses are 2+ on the right side and 2+ on the left side.  Pulmonary:     Effort: Pulmonary effort is normal.  Musculoskeletal:     Comments: Flexion/extension of all 5 digits intact of right hand intact without any difficulty.  Full range of motion of right wrist intact without any difficulty.  Skin:    General: Skin is warm and dry.     Comments: Scattered small superficial abrasions of the palmar aspect of right hand.  No evidence of laceration. Good distal cap refill. RUE is not dusky in appearance or cool to touch.  Neurological:     Mental Status: He is alert and oriented to person, place, and time.     Comments: Equal grip strength bilaterally. Sensation intact along major nerve distributions of BLE Alert and oriented x 3   Psychiatric:        Speech: Speech normal.        Behavior: Behavior normal.      ED Treatments / Results  Labs (all labs ordered are listed, but only abnormal results are displayed) Labs Reviewed  ACETAMINOPHEN LEVEL - Abnormal; Notable for the following components:      Result Value   Acetaminophen (Tylenol), Serum <10 (*)    All other components within normal limits  BASIC METABOLIC PANEL  CBC WITH DIFFERENTIAL/PLATELET  ETHANOL  SALICYLATE LEVEL  RAPID URINE DRUG SCREEN, HOSP PERFORMED    EKG None  Radiology Dg Hand Complete Right  Result Date: 11/16/2018 CLINICAL DATA:  Hand pain. Laceration. EXAM: RIGHT HAND - COMPLETE 3+ VIEW COMPARISON:  None. FINDINGS: There is no evidence of fracture or dislocation. There is no evidence of arthropathy or other focal bone abnormality. No radiopaque foreign body or tracking soft tissue air. IMPRESSION: Negative radiographs of the right hand. Electronically Signed   By: Keith Rake M.D.   On: 11/16/2018 03:50    Procedures Procedures (including critical care time)  Medications Ordered in ED Medications  bacitracin ointment (has  no administration in time range)     Initial Impression / Assessment and Plan / ED Course  I have reviewed the triage vital signs and the nursing notes.  Pertinent labs & imaging results that were available during my care of the patient were reviewed by me and considered in my medical decision making (see chart for details).        23 year old male who presents for evaluation of cut to right hand.  He does not tell me how it happened.  He denies that  he did it himself though.  He states that his tetanus is up-to-date but he does not believe in vaccinations as he does not want a vaccine.  When I ask him about any SI or HI, he replies "I have thoughts about a lot of things," but he will not specify.  He is alert and oriented x3 and able to answer questions appropriately though does not give me much information.  Was seen here 2 days ago for evaluation of possible SI.  Behavioral health team cleared him and he was discharged home.  At initial triage, he did mention that he would like to talk to behavioral health but did not elaborate on why.  I again asked him if he felt like he needed to talk with a counselor and he said yes but would not tell me why exactly.  He does not explicitly expressed any SI and HI denies that he did any intentional cutting.  Will plan for x-ray, medical clearance labs.  Salicylate, ethanol, acetaminophen level unremarkable.  BMP is unremarkable.  CBC without any significant leukocytosis or anemia.  Stray of hand negative for any acute bony abnormality.  Wound was cleaned and irrigated with sterile saline and a Chlorex.  On evaluation of the wound after dried blood was removed.  He has several small abrasions scattered throughout the palmar aspect of his hand.  No lacerations that would require stitches.  No evidence of foreign bodies.  Pending behavioral health evaluation.  Patient signed out to [facility] Petrucceli, PA-C [facility]. Please see her note for further  ED course.   Portions of this note were generated with Scientist, clinical (histocompatibility and immunogenetics)Dragon dictation software. Dictation errors may occur despite best attempts at proofreading.   Final Clinical Impressions(s) / ED Diagnoses   Final diagnoses:  Abrasion    ED Discharge Orders    None       Maxwell CaulLayden, Lindsey A, PA-C 11/16/18 0703    Nira Connardama, Pedro Eduardo, MD 11/16/18 [phone]

## 2018-11-16 NOTE — ED Notes (Signed)
thept was asleep in the waiting room  It took a few seconds to get him awake  He has several small rt hand cuts  Palmer surface that are  Not bleeding  He reports that his hand was cut with a piece of metal.  No other information he reports that he walked here  Old superficial healed scars on both forearms

## 2018-11-16 NOTE — ED Notes (Signed)
Pt sleeping. 

## 2018-11-16 NOTE — ED Notes (Signed)
Pt has no one to contact

## 2018-11-16 NOTE — ED Triage Notes (Signed)
Pt presents with small lacerations to R palm. Pt very resistant to describe how injuries happen, only that it was a sign post. Pt appears paranoid in triage, rambling. Pt states he has not slept in several days and needs a sleep study. Pt declined xray and to have wound cleaned/wraped.

## 2018-11-16 NOTE — ED Notes (Signed)
Pt approached this RN at [employer], pt now states he wouild like to speak with a psychiatrist but declines to elaborate on why he is seeking [facility]

## 2018-12-13 ENCOUNTER — Encounter (HOSPITAL_COMMUNITY): Payer: Self-pay | Admitting: *Deleted

## 2018-12-13 ENCOUNTER — Other Ambulatory Visit: Payer: Self-pay

## 2018-12-13 ENCOUNTER — Emergency Department (HOSPITAL_COMMUNITY)
Admission: EM | Admit: 2018-12-13 | Discharge: 2018-12-13 | Disposition: A | Discharge status: Home / Self Care | Payer: Medicaid Other | Attending: Emergency Medicine | Admitting: Emergency Medicine

## 2018-12-13 ENCOUNTER — Emergency Department (HOSPITAL_COMMUNITY): Payer: Medicaid Other

## 2018-12-13 DIAGNOSIS — F17210 Nicotine dependence, cigarettes, uncomplicated: Secondary | ICD-10-CM | POA: Insufficient documentation

## 2018-12-13 DIAGNOSIS — R0602 Shortness of breath: Secondary | ICD-10-CM | POA: Insufficient documentation

## 2018-12-13 MED ORDER — ALBUTEROL SULFATE HFA 108 (90 BASE) MCG/ACT IN AERS: 1.0000 | INHALATION_SPRAY | Freq: Four times a day (QID) | RESPIRATORY_TRACT | 0 refills | Status: DC | PRN

## 2018-12-13 NOTE — ED Provider Notes (Signed)
[place] EMERGENCY DEPARTMENT Provider Note   CSN: [id number] Arrival date & time: 12/13/18  1215  History   Chief Complaint Chief Complaint  Patient presents with   Shortness of Breath   HPI [name] is a 23 y.o. male with past medical history significant for schizophrenia, ODD, bipolar disorder, anxiety, history of SI with attempt who presents for evaluation of intermittent shortness of breath.  Patient states he has had intermittent shortness of breath x1 year.  Patient states he will occasionally feel wheezy and become short of breath.  He denies any current symptoms now however just wants to be "checked out."  Denies fever, chills, nausea, vomiting, congestion, rhinorrhea, cough, chest pain, abdominal pain, lower extremity edema, erythema, ecchymosis or warmth. No history of PE or DVT.  Denies SI, HI, AVH.  Patient denies any intentional ingestions.  Has not used anything for symptoms. Last episode of SOB 2 weeks ago.  Shortness of breath is not exertional in nature.  He denies any history of pleuritic chest pain.  He denies any associated diaphoresis, lightheadedness, dizziness, nausea or vomiting when he becomes short of breath.  History obtained from patient and past medical records.  No interpreter was used.     HPI  Past Medical History:  Diagnosis Date   ADD (attention deficit disorder)    ADHD (attention deficit hyperactivity disorder), 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   Anemia    Anxiety    Bipolar disorder ([place])    Borderline personality disorder ([place])    Central auditory processing disorder    Deliberate self-cutting    Depressed    Eczema    Headache(784.0)    Oppositional defiant disorder    Schizophrenia [facility])     Patient Active Problem List   Diagnosis Date Noted   Suicidal ideations    Substance induced mood disorder ([place]) 03/10/2018   Paranoid schizophrenia ([place])    Cannabis use disorder, moderate, dependence  ([place]) 06/05/2016   Eating disorder Unspecified 05/22/2016   Borderline personality disorder ([place]) 05/16/2016   Tobacco use disorder 05/15/2016    Past Surgical History:  Procedure Laterality Date   BACK SURGERY     FRACTURE SURGERY     NO PAST SURGERIES     POSTERIOR LUMBAR FUSION N/A 02/19/2015   Procedure: LATERAL L-2 CORPECTOMY;  Surgeon: [name], MD;  Location: [place];  Service: Neurosurgery;  Laterality: N/A;   POSTERIOR LUMBAR FUSION 4 LEVEL  02/19/2015   Procedure: Posterior T-12 - L-4 STABILIZATION OF POSTERIOR LUMBAR;  Surgeon: [name], MD;  Location: [place];  Service: Neurosurgery;;   TIBIA IM NAIL INSERTION Left 02/20/2015   Procedure: INTRAMEDULLARY (IM) NAIL LEFT TIBIAL;  Surgeon: Rod Can, MD;  Location: [place];  Service: Orthopedics;  Laterality: Left;        Home Medications    Prior to Admission medications   Medication Sig Start Date End Date Taking? Authorizing Provider  albuterol (VENTOLIN HFA) 108 (90 Base) MCG/ACT inhaler Inhale 1-2 puffs into the lungs every 6 (six) hours as needed for wheezing or shortness of breath. 12/13/18   Emmelina Mcloughlin A, PA-C    Family History No family history on file.  Social History Social History   Tobacco Use   Smoking status: Current Every Day Smoker    Packs/day: 2.00    Types: Cigarettes   Smokeless tobacco: Never Used   Tobacco comment: 1-3 packs   Substance Use Topics   Alcohol use: Yes    Alcohol/week:  2.0 standard drinks    Types: 2 Shots of liquor per week   Drug use: Yes    Types: Cocaine, Marijuana    Comment: heroin     Allergies   Clozapine, Prolixin [fluphenazine], and Risperdal [risperidone]   Review of Systems Review of Systems  Constitutional: Negative.   HENT: Negative.   Respiratory: Positive for shortness of breath and wheezing. Negative for cough, choking, chest tightness and stridor.   Cardiovascular: Negative.  Negative for chest pain, palpitations  and leg swelling.  Gastrointestinal: Negative.   Genitourinary: Negative.   Musculoskeletal: Negative.   Skin: Negative.   Neurological: Negative.   All other systems reviewed and are negative.    Physical Exam Updated Vital Signs BP 122/72 (BP Location: Right Arm)    Pulse 76    Temp 98 F (36.7 C) (Oral)    Resp 16    Ht 5\' 8"  (1.727 m)    SpO2 99%    BMI 22.05 kg/m   Physical Exam Vitals signs and nursing note reviewed.  Constitutional:      General: He is not in acute distress.    Appearance: He is not ill-appearing, toxic-appearing or diaphoretic.  HENT:     Head: Normocephalic and atraumatic.     Jaw: There is normal jaw occlusion.     Right Ear: Tympanic membrane, ear canal and external ear normal. There is no impacted cerumen. No hemotympanum. Tympanic membrane is not injected, scarred, perforated, erythematous, retracted or bulging.     Left Ear: Tympanic membrane, ear canal and external ear normal. There is no impacted cerumen. No hemotympanum. Tympanic membrane is not injected, scarred, perforated, erythematous, retracted or bulging.     Ears:     Comments: No Mastoid tenderness.    Nose:     Comments: Clear rhinorrhea and congestion to bilateral nares.  No sinus tenderness.    Mouth/Throat:     Comments: Posterior oropharynx clear.  Mucous membranes moist.  Tonsils without erythema or exudate.  Uvula midline without deviation.  No evidence of PTA or RPA.  No drooling, dysphasia or trismus.  Phonation normal. Neck:     Trachea: Trachea and phonation normal.     Meningeal: Brudzinski's sign and Kernig's sign absent.     Comments: No Neck stiffness or neck rigidity.  No meningismus.  No cervical lymphadenopathy. Cardiovascular:     Comments: No murmurs rubs or gallops. Pulmonary:     Effort: Pulmonary effort is normal.     Breath sounds: Normal breath sounds.     Comments: Clear to auscultation bilaterally without wheeze, rhonchi or rales.  No accessory muscle usage.   Able speak in full sentences. Abdominal:     Comments: Soft, nontender without rebound or guarding.  No CVA tenderness.  Musculoskeletal:     Comments: Moves all 4 extremities without difficulty.  Lower extremities without edema, erythema or warmth.  Skin:    Comments: Brisk capillary refill.  No rashes or lesions.  Neurological:     Mental Status: He is alert.     Comments: Ambulatory in department without difficulty.  Cranial nerves II through XII grossly intact.  No facial droop.  No aphasia.      ED Treatments / Results  Labs (all labs ordered are listed, but only abnormal results are displayed) Labs Reviewed - No data to display  EKG None  Radiology Dg Chest Portable 1 View  Result Date: 12/13/2018 CLINICAL DATA:  Shortness of breath EXAM: PORTABLE CHEST 1 VIEW  COMPARISON:  February 22, 2015 FINDINGS: Lungs are clear. Heart size and pulmonary vascularity are normal. No adenopathy. Postoperative change noted in the lower thoracic and visualized upper lumbar regions. IMPRESSION: No edema or consolidation.  Cardiac silhouette within normal limits. Electronically Signed   By: Bretta BangWilliam  Woodruff III M.D.   On: 12/13/2018 14:00    Procedures Procedures (including critical care time)  Medications Ordered in ED Medications - No data to display   Initial Impression / Assessment and Plan / ED Course  I have reviewed the triage vital signs and the nursing notes.  Pertinent labs & imaging results that were available during my care of the patient were reviewed by me and considered in my medical decision making (see chart for details).  23 year old male appears otherwise well presents for evaluation of shortness of breath x1 year.  He is afebrile, nonseptic, non-ill-appearing.  Patient with intermittent shortness of breath with wheezing x1 year.  Last episode 2 weeks ago.  He is concerned "there could be something wrong."  Denies PE or DVT.  He has no evidence of DVT on exam without  lower extremity edema, erythema, ecchymosis or warmth.  His heart and lungs are clear.  He speaks in full sentences without difficulty.  No associated diaphoresis, lightheadedness, dizziness or nausea.  Shortness of breath is not exertional in nature.  Denies prior history of asthma, cardiac or lung pathology.  He has not take anything for symptoms.  He does have significant psychiatric history.  No new medications.  He denies SI, HI, AVH.  Denies intentional self-harm.  No congestion, fever, cough without COVID exposures.  I have low suspicion for this given length of symptoms.  EKG without tachycardia, ST/T changes.  No STEMI. DG chest with infiltrates, cardiomegaly, pulmonary edema, pneumothorax.  Patient without any current symptoms. Will dc home with Albuterol inhaler for his intermittent shortness of breath.  Possible related to new onset asthma versus allergies.  Also discussed starting Zyrtec.  Discussed follow-up with a PCP.  He was given resources.  No risk factors for ACS, PE, dissection.  Ambulatory in ED without difficulty.  SOB is not likely of cardiac or pulmonary etiology d/t presentation, PERC negative, VSS, no tracheal deviation, no JVD or new murmur, RRR, breath sounds equal bilaterally, EKG without acute abnormalities, and negative CXR. Pt has been advised to return to the ED if SOB becomes exertional, associated with diaphoresis or nausea, radiates to left jaw/arm, worsens or becomes concerning in any way.   The patient has been appropriately medically screened and/or stabilized in the ED. I have low suspicion for any other emergent medical condition which would require further screening, evaluation or treatment in the ED or require inpatient management.  Patient is hemodynamically stable and in no acute distress.  Patient able to ambulate in department prior to ED.  Evaluation does not show acute pathology that would require ongoing or additional emergent interventions while in the  emergency department or further inpatient treatment.  I have discussed the diagnosis with the patient and answered all questions.  Pain is been managed while in the emergency department and patient has no further complaints prior to discharge.  Patient is comfortable with plan discussed in room and is stable for discharge at this time.  I have discussed strict return precautions for returning to the emergency department.  Patient was encouraged to follow-up with PCP/specialist refer to at discharge.       [name] was evaluated in Emergency Department on  12/13/2018 for the symptoms described in the history of present illness. He was evaluated in the context of the global COVID-19 pandemic, which necessitated consideration that the patient might be at risk for infection with the SARS-CoV-2 virus that causes COVID-19. Institutional protocols and algorithms that pertain to the evaluation of patients at risk for COVID-19 are in a state of rapid change based on information released by regulatory bodies including the CDC and federal and state organizations. These policies and algorithms were followed during the patient's care in the ED. Final Clinical Impressions(s) / ED Diagnoses   Final diagnoses:  SOB (shortness of breath)    ED Discharge Orders         Ordered    albuterol (VENTOLIN HFA) 108 (90 Base) MCG/ACT inhaler  Every 6 hours PRN     12/13/18 1410           Avary Eichenberger A, PA-C 12/13/18 1416    Sabas SousBero, Michael M, MD 12/17/18 0730

## 2018-12-13 NOTE — ED Triage Notes (Signed)
To ED for eval of sob for the past year. Nothing has changed with the sob.

## 2018-12-13 NOTE — Discharge Instructions (Signed)
Use Albuterol inhaler as needed.  Follow-up with a primary care provider return to emergency department for any worsening symptoms.

## 2018-12-18 ENCOUNTER — Emergency Department (HOSPITAL_COMMUNITY)
Admission: EM | Admit: 2018-12-18 | Discharge: 2018-12-18 | Disposition: A | Discharge status: Home / Self Care | Payer: Medicaid Other | Attending: Emergency Medicine | Admitting: Emergency Medicine

## 2018-12-18 ENCOUNTER — Encounter (HOSPITAL_COMMUNITY): Payer: Self-pay

## 2018-12-18 ENCOUNTER — Other Ambulatory Visit: Payer: Self-pay

## 2018-12-18 DIAGNOSIS — Z5321 Procedure and treatment not carried out due to patient leaving prior to being seen by health care provider: Secondary | ICD-10-CM | POA: Diagnosis not present

## 2018-12-18 DIAGNOSIS — R079 Chest pain, unspecified: Secondary | ICD-10-CM | POA: Insufficient documentation

## 2018-12-18 LAB — CBC
HCT: 47.5 % (ref 39.0–52.0)
Hemoglobin: 15.4 g/dL (ref 13.0–17.0)
MCH: 27.6 pg (ref 26.0–34.0)
MCHC: 32.4 g/dL (ref 30.0–36.0)
MCV: 85.3 fL (ref 80.0–100.0)
Platelets: 246 K/uL (ref 150–400)
RBC: 5.57 MIL/uL (ref 4.22–5.81)
RDW: 13 % (ref 11.5–15.5)
WBC: 6.2 K/uL (ref 4.0–10.5)
nRBC: 0 % (ref 0.0–0.2)

## 2018-12-18 LAB — BASIC METABOLIC PANEL WITH GFR
Anion gap: 10 (ref 5–15)
BUN: 5 mg/dL — ABNORMAL LOW (ref 6–20)
CO2: 27 mmol/L (ref 22–32)
Calcium: 9.4 mg/dL (ref 8.9–10.3)
Chloride: 101 mmol/L (ref 98–111)
Creatinine, Ser: 0.8 mg/dL (ref 0.61–1.24)
GFR calc Af Amer: >60 mL/min
GFR calc non Af Amer: >60 mL/min
Glucose, Bld: 89 mg/dL (ref 70–99)
Potassium: 3.8 mmol/L (ref 3.5–5.1)
Sodium: 138 mmol/L (ref 135–145)

## 2018-12-18 LAB — TROPONIN I (HIGH SENSITIVITY): Troponin I (High Sensitivity): 2 ng/L

## 2018-12-18 MED ORDER — SODIUM CHLORIDE 0.9% FLUSH: 3.0000 mL | Freq: Once | INTRAVENOUS | Status: DC

## 2018-12-18 NOTE — ED Triage Notes (Signed)
Pt states that for the past week he has been having CP, headache, chills, SOB. No known sick contacts

## 2018-12-18 NOTE — ED Notes (Signed)
Pt stated that he is leaving because "the nurse told me shes only going to do the same stuff they did last week so ill take my chances"

## 2018-12-19 LAB — TROPONIN I (HIGH SENSITIVITY): Troponin I (High Sensitivity): 2 ng/L (ref <18)

## 2018-12-30 ENCOUNTER — Emergency Department (HOSPITAL_COMMUNITY)
Admission: EM | Admit: 2018-12-30 | Discharge: 2018-12-30 | Discharge status: Against Medical Advice | Payer: BC Managed Care – PPO | Attending: Emergency Medicine | Admitting: Emergency Medicine

## 2018-12-30 ENCOUNTER — Encounter (HOSPITAL_COMMUNITY): Payer: Self-pay | Admitting: Emergency Medicine

## 2018-12-30 DIAGNOSIS — F329 Major depressive disorder, single episode, unspecified: Secondary | ICD-10-CM | POA: Diagnosis present

## 2018-12-30 DIAGNOSIS — Z5321 Procedure and treatment not carried out due to patient leaving prior to being seen by health care provider: Secondary | ICD-10-CM | POA: Insufficient documentation

## 2018-12-30 LAB — COMPREHENSIVE METABOLIC PANEL
ALT: 17 U/L (ref 0–44)
AST: 28 U/L (ref 15–41)
Albumin: 4.1 g/dL (ref 3.5–5.0)
Alkaline Phosphatase: 75 U/L (ref 38–126)
Anion gap: 10 (ref 5–15)
BUN: 10 mg/dL (ref 6–20)
CO2: 27 mmol/L (ref 22–32)
Calcium: 9.7 mg/dL (ref 8.9–10.3)
Chloride: 103 mmol/L (ref 98–111)
Creatinine, Ser: 0.87 mg/dL (ref 0.61–1.24)
GFR calc Af Amer: >60 mL/min (ref >60)
GFR calc non Af Amer: >60 mL/min (ref >60)
Glucose, Bld: 106 mg/dL — ABNORMAL HIGH (ref 70–99)
Potassium: 4 mmol/L (ref 3.5–5.1)
Sodium: 140 mmol/L (ref 135–145)
Total Bilirubin: 0.6 mg/dL (ref 0.3–1.2)
Total Protein: 7.1 g/dL (ref 6.5–8.1)

## 2018-12-30 LAB — CBC
HCT: 47.7 % (ref 39.0–52.0)
Hemoglobin: 15.5 g/dL (ref 13.0–17.0)
MCH: 27.7 pg (ref 26.0–34.0)
MCHC: 32.5 g/dL (ref 30.0–36.0)
MCV: 85.2 fL (ref 80.0–100.0)
Platelets: 243 10*3/uL (ref 150–400)
RBC: 5.6 MIL/uL (ref 4.22–5.81)
RDW: 13.1 % (ref 11.5–15.5)
WBC: 4.9 10*3/uL (ref 4.0–10.5)
nRBC: 0 % (ref 0.0–0.2)

## 2018-12-30 LAB — ETHANOL: Alcohol, Ethyl (B): <10 mg/dL (ref <10)

## 2018-12-30 LAB — SALICYLATE LEVEL: Salicylate Lvl: <7 mg/dL (ref 2.8–30.0)

## 2018-12-30 LAB — ACETAMINOPHEN LEVEL: Acetaminophen (Tylenol), Serum: <10 ug/mL — ABNORMAL LOW (ref 10–30)

## 2018-12-30 NOTE — ED Triage Notes (Signed)
Pt here from home with c/o SI and depression , pt has hx of same

## 2019-01-11 ENCOUNTER — Other Ambulatory Visit: Payer: Self-pay

## 2019-01-11 ENCOUNTER — Emergency Department (HOSPITAL_COMMUNITY)
Admission: EM | Admit: 2019-01-11 | Discharge: 2019-01-11 | Disposition: A | Discharge status: Home / Self Care | Payer: BC Managed Care – PPO | Attending: Emergency Medicine | Admitting: Emergency Medicine

## 2019-01-11 ENCOUNTER — Encounter (HOSPITAL_COMMUNITY): Payer: Self-pay

## 2019-01-11 DIAGNOSIS — Z5321 Procedure and treatment not carried out due to patient leaving prior to being seen by health care provider: Secondary | ICD-10-CM | POA: Insufficient documentation

## 2019-01-11 DIAGNOSIS — F329 Major depressive disorder, single episode, unspecified: Secondary | ICD-10-CM | POA: Insufficient documentation

## 2019-01-11 NOTE — ED Triage Notes (Signed)
Pt reports that he feels very nihilistic. When asked if he was feeling HI/SI, he states, "Only when asked." He denies recent drug or alcohol use. Denies any physical pain or symptoms. Pt affect is flat. Hx of SI and depression. Denies any precipitating circumstances.

## 2019-01-11 NOTE — ED Notes (Signed)
Pt states that he feels better and wants to leave. He asked if he was voluntary and I told him yes, but the doctor is on his way to see you. He states, "I dont want to be seen anymore, I'm fine." I asked once again if he was feeling like he wanted to hurt himself or someone else and he verbalized, "no, I dont." Pt ambulated out of the ED in no distress.

## 2019-01-25 ENCOUNTER — Encounter (HOSPITAL_COMMUNITY): Payer: Self-pay | Admitting: *Deleted

## 2019-01-25 ENCOUNTER — Emergency Department (HOSPITAL_COMMUNITY)
Admission: EM | Admit: 2019-01-25 | Discharge: 2019-01-27 | Disposition: A | Discharge status: Other Acute Inpatient Hospital | Payer: BC Managed Care – PPO | Attending: Emergency Medicine | Admitting: Emergency Medicine

## 2019-01-25 ENCOUNTER — Other Ambulatory Visit: Payer: Self-pay

## 2019-01-25 DIAGNOSIS — Z20828 Contact with and (suspected) exposure to other viral communicable diseases: Secondary | ICD-10-CM | POA: Insufficient documentation

## 2019-01-25 DIAGNOSIS — Y999 Unspecified external cause status: Secondary | ICD-10-CM | POA: Insufficient documentation

## 2019-01-25 DIAGNOSIS — F319 Bipolar disorder, unspecified: Secondary | ICD-10-CM | POA: Diagnosis not present

## 2019-01-25 DIAGNOSIS — Y929 Unspecified place or not applicable: Secondary | ICD-10-CM | POA: Diagnosis not present

## 2019-01-25 DIAGNOSIS — F430 Acute stress reaction: Secondary | ICD-10-CM | POA: Diagnosis present

## 2019-01-25 DIAGNOSIS — Z79899 Other long term (current) drug therapy: Secondary | ICD-10-CM | POA: Insufficient documentation

## 2019-01-25 DIAGNOSIS — S60511A Abrasion of right hand, initial encounter: Secondary | ICD-10-CM | POA: Diagnosis not present

## 2019-01-25 DIAGNOSIS — F603 Borderline personality disorder: Secondary | ICD-10-CM | POA: Insufficient documentation

## 2019-01-25 DIAGNOSIS — R45851 Suicidal ideations: Secondary | ICD-10-CM

## 2019-01-25 DIAGNOSIS — Y939 Activity, unspecified: Secondary | ICD-10-CM | POA: Insufficient documentation

## 2019-01-25 DIAGNOSIS — W25XXXA Contact with sharp glass, initial encounter: Secondary | ICD-10-CM | POA: Insufficient documentation

## 2019-01-25 DIAGNOSIS — F17210 Nicotine dependence, cigarettes, uncomplicated: Secondary | ICD-10-CM | POA: Insufficient documentation

## 2019-01-25 NOTE — ED Triage Notes (Signed)
Pt states he has been experiencing a lot of stress recently and reports SI/HI. He has not done any self injury or ingestion.  Reports he would OD on cocaine.  Pt has flat affect.  Pt punched a window with his right hand today and reports no pain.  Small superficial lacs. Pt is aware of being changed into burgundy scrubs and will call security to wand.

## 2019-01-26 ENCOUNTER — Emergency Department (HOSPITAL_COMMUNITY): Payer: BC Managed Care – PPO

## 2019-01-26 DIAGNOSIS — F603 Borderline personality disorder: Secondary | ICD-10-CM | POA: Diagnosis not present

## 2019-01-26 LAB — SARS CORONAVIRUS 2 BY RT PCR (HOSPITAL ORDER, PERFORMED IN ~~LOC~~ HOSPITAL LAB): SARS Coronavirus 2: NEGATIVE

## 2019-01-26 LAB — COMPREHENSIVE METABOLIC PANEL
ALT: 19 U/L (ref 0–44)
AST: 31 U/L (ref 15–41)
Albumin: 4.3 g/dL (ref 3.5–5.0)
Alkaline Phosphatase: 71 U/L (ref 38–126)
Anion gap: 12 (ref 5–15)
BUN: 11 mg/dL (ref 6–20)
CO2: 24 mmol/L (ref 22–32)
Calcium: 9.8 mg/dL (ref 8.9–10.3)
Chloride: 105 mmol/L (ref 98–111)
Creatinine, Ser: 0.78 mg/dL (ref 0.61–1.24)
GFR calc Af Amer: >60 mL/min (ref >60)
GFR calc non Af Amer: >60 mL/min (ref >60)
Glucose, Bld: 92 mg/dL (ref 70–99)
Potassium: 3.7 mmol/L (ref 3.5–5.1)
Sodium: 141 mmol/L (ref 135–145)
Total Bilirubin: 0.4 mg/dL (ref 0.3–1.2)
Total Protein: 7.4 g/dL (ref 6.5–8.1)

## 2019-01-26 LAB — CBC
HCT: 47.7 % (ref 39.0–52.0)
Hemoglobin: 16 g/dL (ref 13.0–17.0)
MCH: 28.7 pg (ref 26.0–34.0)
MCHC: 33.5 g/dL (ref 30.0–36.0)
MCV: 85.5 fL (ref 80.0–100.0)
Platelets: 197 10*3/uL (ref 150–400)
RBC: 5.58 MIL/uL (ref 4.22–5.81)
RDW: 13.7 % (ref 11.5–15.5)
WBC: 5.4 10*3/uL (ref 4.0–10.5)
nRBC: 0 % (ref 0.0–0.2)

## 2019-01-26 LAB — RAPID URINE DRUG SCREEN, HOSP PERFORMED
Amphetamines: NOT DETECTED
Barbiturates: NOT DETECTED
Benzodiazepines: NOT DETECTED
Cocaine: NOT DETECTED
Opiates: NOT DETECTED
Tetrahydrocannabinol: POSITIVE — AB

## 2019-01-26 LAB — SALICYLATE LEVEL: Salicylate Lvl: <7 mg/dL (ref 2.8–30.0)

## 2019-01-26 LAB — ETHANOL: Alcohol, Ethyl (B): <10 mg/dL (ref <10)

## 2019-01-26 LAB — ACETAMINOPHEN LEVEL: Acetaminophen (Tylenol), Serum: <10 ug/mL — ABNORMAL LOW (ref 10–30)

## 2019-01-26 MED ORDER — LORAZEPAM 1 MG PO TABS
2.0000 mg | ORAL_TABLET | Freq: Once | ORAL | Status: DC
  Filled 2019-01-26: qty 2

## 2019-01-26 MED ORDER — NICOTINE 21 MG/24HR TD PT24: 21.0000 mg | MEDICATED_PATCH | TRANSDERMAL | Status: DC | PRN

## 2019-01-26 NOTE — ED Notes (Signed)
Pt has been wanded and belongings secured at the desk. One book bag and one bag

## 2019-01-26 NOTE — ED Notes (Signed)
Called [facility] regarding when patient would be reassessed as he appears to be growing impatient. [place] states the provider is currently doing rounding and reassessments now and he should be reassessed soon. Patient updated and verbalized understanding.

## 2019-01-26 NOTE — ED Notes (Signed)
Pt arrived to Rm 53 - wearing burgundy scrubs. Sitter w/pt.

## 2019-01-26 NOTE — ED Provider Notes (Signed)
[facility] EMERGENCY DEPARTMENT Provider Note   CSN: [id number] Arrival date & time: 01/25/19  2235     History   Chief Complaint Chief Complaint  Patient presents with  . Suicidal  . Homicidal    HPI [name] is a 23 y.o. male.     HPI   [name] is a 23 y.o. male, with a history of ADHD, anxiety, anemia, bipolar, tobacco use, polysubstance abuse, schizophrenia, presenting to the ED with suicidal ideations for the last couple months.   Plan: States, "I just planned to overdose on cocaine because I thought that would be easiest."  Support system: States he does not have much support.  Previous Attempts: Previously attempted overdose as well as jumping off an overpass.  Latest attempt was 2016 with the jump off the overpass.  Drug/alcohol use: States he uses cocaine whenever he can.  Last use was last month.  Denies other illicit drug use.  States he drinks alcohol "a few drinks a week."  Last alcohol was 2 days ago.  Medication compliance: States he is now on a regimen of injectable Abilify with last injection 2 weeks ago.  He receives this injection every 30 days.  Medication changes: He was previously noncompliant with his medications and therefore he was placed on the injectable Abilify last month.  Physical complaints: This afternoon, he punched a glass door with his right hand.  He has small abrasions and lacerations to the right dorsal hand.  Denies numbness, weakness, other injuries.    Past Medical History:  Diagnosis Date  . ADD (attention deficit disorder)   . 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 Anemia   . Anxiety   . Bipolar disorder (HCC)   . Borderline personality disorder (HCC)   . Central auditory processing disorder   . Deliberate self-cutting   . Depressed   . Eczema   . Headache(784.0)   . Oppositional defiant disorder   . Schizophrenia [facility])      Patient Active Problem List   Diagnosis Date Noted  . Suicidal ideations   . Substance induced mood disorder (HCC) 03/10/2018  . Paranoid schizophrenia (HCC)   . Cannabis use disorder, moderate, dependence (HCC) 06/05/2016  . Eating disorder Unspecified 05/22/2016  . Borderline personality disorder (HCC) 05/16/2016  . Tobacco use disorder 05/15/2016    Past Surgical History:  Procedure Laterality Date  . BACK SURGERY    . FRACTURE SURGERY    . NO PAST SURGERIES    . POSTERIOR LUMBAR FUSION N/A 02/19/2015   Procedure: LATERAL L-2 CORPECTOMY;  Surgeon: Lisbeth Renshaw, MD;  Location: [facility] OR;  Service: Neurosurgery;  Laterality: N/A;  . POSTERIOR LUMBAR FUSION 4 LEVEL  02/19/2015   Procedure: Posterior T-12 - L-4 STABILIZATION OF POSTERIOR LUMBAR;  Surgeon: Lisbeth Renshaw, MD;  Location: [facility] OR;  Service: Neurosurgery;;  . TIBIA IM NAIL INSERTION Left 02/20/2015   Procedure: INTRAMEDULLARY (IM) NAIL LEFT TIBIAL;  Surgeon: [name], MD;  Location: [facility] OR;  Service: Orthopedics;  Laterality: Left;        Home Medications    Prior to Admission medications   Medication Sig Start Date End Date Taking? Authorizing Provider  albuterol (VENTOLIN HFA) 108 (90 Base) MCG/ACT inhaler Inhale 1-2 puffs into the lungs every 6 (six) hours as needed for wheezing or shortness of breath. 12/13/18   Henderly, Britni A, PA-C    Family History No family history on file.  Social History Social  History   Tobacco Use  . Smoking status: Current Every Day Smoker    Packs/day: 2.00    Types: Cigarettes  . Smokeless tobacco: Never Used  . Tobacco comment: 1-3 packs   Substance Use Topics  . Alcohol use: Yes    Alcohol/week: 2.0 standard drinks    Types: 2 Shots of liquor per week  . Drug use: Yes    Types: Cocaine, Marijuana    Comment: heroin     Allergies   Clozapine, Prolixin [fluphenazine], and Risperdal [risperidone]   Review of Systems Review of Systems  Constitutional:  Negative for chills, diaphoresis and fever.  Respiratory: Negative for cough and shortness of breath.   Cardiovascular: Negative for chest pain.  Gastrointestinal: Negative for abdominal pain, diarrhea, nausea and vomiting.  Musculoskeletal: Negative for back pain and neck pain.  Skin: Positive for wound.  Neurological: Negative for weakness and numbness.  Psychiatric/Behavioral: Positive for dysphoric mood and suicidal ideas.  All other systems reviewed and are negative.    Physical Exam Updated Vital Signs BP 110/64 (BP Location: Right Arm)   Pulse (!) 50   Temp 98 F (36.7 C) (Oral)   Resp (!) 22   SpO2 100%   Physical Exam Vitals signs and nursing note reviewed.  Constitutional:      General: He is not in acute distress.    Appearance: He is well-developed. He is not diaphoretic.  HENT:     Head: Normocephalic and atraumatic.     Mouth/Throat:     Mouth: Mucous membranes are moist.     Pharynx: Oropharynx is clear.  Eyes:     Conjunctiva/sclera: Conjunctivae normal.  Neck:     Musculoskeletal: Neck supple.  Cardiovascular:     Rate and Rhythm: Normal rate and regular rhythm.     Pulses: Normal pulses.          Radial pulses are 2+ on the right side and 2+ on the left side.       Posterior tibial pulses are 2+ on the right side and 2+ on the left side.     Heart sounds: Normal heart sounds.     Comments: Tactile temperature in the extremities appropriate and equal bilaterally. Pulmonary:     Effort: Pulmonary effort is normal. No respiratory distress.     Breath sounds: Normal breath sounds.  Abdominal:     Palpations: Abdomen is soft.     Tenderness: There is no abdominal tenderness. There is no guarding.  Musculoskeletal:     Right lower leg: No edema.     Left lower leg: No edema.     Comments: Multiple abrasions and small lacerations to the right dorsal fingers.  There is a partial avulsion, superficial, over the right middle dorsal PIP. Full range of motion  in the DIP, PIP, and MCP joints of the right hand without noted difficulty.  Lymphadenopathy:     Cervical: No cervical adenopathy.  Skin:    General: Skin is warm and dry.     Capillary Refill: Capillary refill takes less than 2 seconds.  Neurological:     Mental Status: He is alert.     Comments: Sensation to light touch grossly intact in the bilateral hands and fingers. Grip strengths equal. Flexion and extension of the fingers against resistance intact and equal bilaterally.  Psychiatric:        Mood and Affect: Affect is flat.        Speech: Speech normal.  Behavior: Behavior normal.        Thought Content: Thought content includes suicidal ideation. Thought content includes suicidal plan.          ED Treatments / Results  Labs (all labs ordered are listed, but only abnormal results are displayed) Labs Reviewed  ACETAMINOPHEN LEVEL - Abnormal; Notable for the following components:      Result Value   Acetaminophen (Tylenol), Serum <10 (*)    All other components within normal limits  RAPID URINE DRUG SCREEN, HOSP PERFORMED - Abnormal; Notable for the following components:   Tetrahydrocannabinol POSITIVE (*)    All other components within normal limits  SARS CORONAVIRUS 2 (HOSPITAL ORDER, [place] LAB)  COMPREHENSIVE METABOLIC PANEL  ETHANOL  SALICYLATE LEVEL  CBC    EKG None  Radiology Dg Hand Complete Right  Result Date: 01/26/2019 CLINICAL DATA:  Recently punched window with hand pain, initial encounter EXAM: RIGHT HAND - COMPLETE 3+ VIEW COMPARISON:  11/16/2018 FINDINGS: There is no evidence of fracture or dislocation. There is no evidence of arthropathy or other focal bone abnormality. Soft tissues are unremarkable. IMPRESSION: No acute abnormality noted. Electronically Signed   By: Inez Catalina M.D.   On: 01/26/2019 01:18    Procedures Procedures (including critical care time)  Medications Ordered in ED Medications   nicotine (NICODERM CQ - dosed in mg/24 hours) patch 21 mg (has no administration in time range)     Initial Impression / Assessment and Plan / ED Course  I have reviewed the triage vital signs and the nursing notes.  Pertinent labs & imaging results that were available during my care of the patient were reviewed by me and considered in my medical decision making (see chart for details).        Patient presents with suicidal ideations.  Injuries to patient's right hand appear to be superficial.  There were no noted lacerations that would benefit from repair. Lab work unremarkable.  Patient medically cleared.  No regular home medications to order. Patient awaiting TTS evaluation.  At the end of my shift, Dr. Stark Jock was filled in on the plan for this patient. Disposition per psych.   Final Clinical Impressions(s) / ED Diagnoses   Final diagnoses:  None    ED Discharge Orders    None       Layla Maw 01/26/19 0522    Merryl Hacker, MD 01/26/19 [phone]

## 2019-01-26 NOTE — ED Notes (Signed)
Pt belongings are in locker #5

## 2019-01-26 NOTE — BH Assessment (Addendum)
Tele Assessment Note   Patient Name: [name] MRN: [id number] Referring Physician: Harolyn RutherfordShawn Joy, PA [facility] of [facility]: [facility] Location of Provider: Behavioral Health TTS Department  [name] is an 23 y.o. male.  -Clinician reviewed note by Harolyn RutherfordShawn Joy, PA.  [name] is a 23 y.o. male, with a history of ADHD, anxiety, anemia, bipolar, tobacco use, polysubstance abuse, schizophrenia, presenting to the ED with suicidal ideations for the last couple months. Plan: States, "I just planned to overdose on cocaine because I thought that would be easiest." Support system: States he does not have much support. Previous Attempts: Previously attempted overdose as well as jumping off an overpass.  Latest attempt was 2016 with the jump off the overpass.  Patient is drowsy from sleeping.  He said [facility] instead of going to his aunt's home (where he has been staying).  He said he had gone to visit some other relative with whom he used to stay.  He said he got frustrated about family situation and had punched a glass window.  He did have some minor lacerations.    When he was in triage he was asked if he were to kill himself how would he do it and he responded that he had planned to overdose on cocaine. When this clinician asked if he still wanted to kill himself he said, "No I didn't really earlier, I just did not want to go home because my aunt told me not to come to the ED."  Patient is currently denying any SI or current plan for suicide.    Patient also denies any HI or A/V hallucinations.  Patient says he last used cocaine a few weeks ago.  Patient is positive for marijuana.  He last used a few days ago.  Patient has a flat affect but he denies depressive symptoms.  Patient has good eye contact.  His affect does not match his statement of not being depressed.    Patient has [facility] that does his ACTT team.  He has an Abilify  injection that is done monthly and he hs next injection in about 2 weeks.  Pt is enrolled in a program where he may be able to get an apartment.  [facility] and [facility].  Pt says he has no contacts for collateral information.    -Clinician did attempt to call [facility] to leave a message but was unable to do so.  No answer on their end.  Clinician discussed patient care with Nira ConnJason Berry, FNP who recommends patient have a psychiatry consult since he did injure his hand .  Clinician informed Dr. Wilkie AyeHorton of disposition.   Diagnosis: Borderline personality d/o  Past Medical History:  Past Medical History:  Diagnosis Date  . ADD (attention deficit disorder)   . 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 Anemia   . Anxiety   . Bipolar disorder (HCC)   . Borderline personality disorder (HCC)   . Central auditory processing disorder   . Deliberate self-cutting   . Depressed   . Eczema   . Headache(784.0)   . Oppositional defiant disorder   . Schizophrenia [facility](HCC)     Past Surgical History:  Procedure Laterality Date  . BACK SURGERY    . FRACTURE SURGERY    . NO PAST SURGERIES    . POSTERIOR LUMBAR FUSION N/A 02/19/2015   Procedure: LATERAL L-2 CORPECTOMY;  Surgeon: Lisbeth RenshawNeelesh Nundkumar, MD;  [facility];  Service: Neurosurgery;  Laterality: N/A;  . POSTERIOR LUMBAR FUSION 4 LEVEL  02/19/2015   Procedure: Posterior T-12 - L-4 STABILIZATION OF POSTERIOR LUMBAR;  Surgeon: Lisbeth RenshawNeelesh Nundkumar, MD;  Location: [facility] OR;  Service: Neurosurgery;;  . TIBIA IM NAIL INSERTION Left 02/20/2015   Procedure: INTRAMEDULLARY (IM) NAIL LEFT TIBIAL;  Surgeon: Samson FredericBrian Swinteck, MD;  Location: [facility] OR;  Service: Orthopedics;  Laterality: Left;    Family History: No family history on file.  Social History:  reports that he has been smoking cigarettes. He has been smoking about 2.00 packs per day. He has never used  smokeless tobacco. He reports current alcohol use of about 2.0 standard drinks of alcohol per week. He reports current drug use. Drugs: Cocaine and Marijuana.  Additional Social History:  Alcohol / Drug Use Pain Medications: None Prescriptions: Abilify Over the Counter: None History of alcohol / drug use?: Yes Substance #1 Name of Substance 1: Marijuana 1 - Age of First Use: 23 years of age 62 - Amount (size/oz): Will take one toke when other people have it. 1 - Frequency: Varies 1 - Duration: off and on 1 - Last Use / Amount: 09/12 Substance #2 Name of Substance 2: Cocaine (powder) 2 - Age of First Use: 23 years of age 44 - Amount (size/oz): Varies 2 - Frequency: 1-2 times in a month 2 - Duration: off and on 2 - Last Use / Amount: Can't recall, maybe a month ago.  CIWA: CIWA-Ar BP: 110/64 Pulse Rate: (!) 50 COWS:    Allergies:  Allergies  Allergen Reactions  . Clozapine Hives and Other (See Comments)    Increased blood pressure  . Prolixin [Fluphenazine] Other (See Comments)    Hallucinations  . Risperdal [Risperidone] Other (See Comments)    Unknown    Home Medications: (Not in a hospital admission)   OB/GYN Status:  No LMP for male patient.  General Assessment Data [facility] Assessment: In system Is this a Tele or Face-to-Face Assessment?: Tele Assessment Is this an Initial Assessment or a Re-assessment for this encounter?: Initial Assessment Patient Accompanied by:: N/A Language Other than English: No Living Arrangements: Other (Comment)(Staying with aunt.) What gender do you identify as?: Male Marital status: Single Pregnancy Status: No Living Arrangements: Other (Comment)(Pt staying with aunt.  Waiting on an apartment.) Can pt return to current living arrangement?: Yes Admission Status: Voluntary Is patient capable of signing voluntary admission?: Yes Referral Source: Self/Family/Friend([facility] Insurance type: BC/BS &  MCD     Crisis Care Plan Living Arrangements: Other (Comment)(Pt staying with aunt.  Waiting on an apartment.) Name of Psychiatrist: [facility] Name of Therapist: [facility]  Education Status Is patient currently in school?: No Is the patient employed, unemployed or receiving disability?: Unemployed  Risk to self with the past 6 months Suicidal Ideation: No-Not Currently/Within Last 6 Months Has patient been a risk to self within the past 6 months prior to admission? : No Suicidal Intent: No Has patient had any suicidal intent within the past 6 months prior to admission? : No Is patient at risk for suicide?: No Suicidal Plan?: No-Not Currently/Within Last 6 Months(Pt denies wanting to harm himself now.) Has patient had any suicidal plan within the past 6 months prior to admission? : No Specify Current Suicidal Plan: None.  Had mentioned overdosing on cocaine Access to Means: No What has been your use of drugs/alcohol within the last 12  months?: THC Previous Attempts/Gestures: Yes How many times?: 2 Other Self Harm Risks: NoneNoen Triggers for Past Attempts: Unpredictable Intentional Self Injurious Behavior: None Family Suicide History: No Recent stressful life event(s): Conflict (Comment) Persecutory voices/beliefs?: No Depression: No Depression Symptoms: (Pt denies depressive symptoms.) Substance abuse history and/or treatment for substance abuse?: Yes Suicide prevention information given to non-admitted patients: Not applicable  Risk to Others within the past 6 months Homicidal Ideation: No Does patient have any lifetime risk of violence toward others beyond the six months prior to admission? : No Thoughts of Harm to Others: No-Not Currently Present/Within Last 6 Months Current Homicidal Intent: No Current Homicidal Plan: No Access to Homicidal Means: No Identified Victim: No one History of harm to others?: No Assessment of Violence: None  Noted Violent Behavior Description: "No not really" Does patient have access to weapons?: No Criminal Charges Pending?: No Does patient have a court date: No Is patient on probation?: Yes(Pt cannot recall the name.)  Psychosis Hallucinations: None noted Delusions: None noted  Mental Status Report Appearance/Hygiene: Unremarkable, In scrubs Eye Contact: Good Motor Activity: Freedom of movement, Unremarkable Speech: Logical/coherent Level of Consciousness: Quiet/awake Mood: Apathetic Affect: Appropriate to circumstance Anxiety Level: None Thought Processes: Coherent, Relevant Judgement: Unimpaired Orientation: Person, Place, Situation, Time Obsessive Compulsive Thoughts/Behaviors: None  Cognitive Functioning Concentration: Normal Memory: Remote Intact, Recent Intact Is patient IDD: No Insight: Poor Impulse Control: Fair Appetite: Good Have you had any weight changes? : No Change Sleep: Decreased Total Hours of Sleep: (<4H/D) Vegetative Symptoms: None  ADLScreening [facility]) Patient's cognitive ability adequate to safely complete daily activities?: Yes Patient able to express need for assistance with ADLs?: Yes Independently performs ADLs?: Yes (appropriate for developmental age)  Prior Inpatient Therapy Prior Inpatient Therapy: Yes Prior Therapy Dates: 09/2017; 03/2018 Prior Therapy Facilty/Provider(s): [facility]; [place] Reason for Treatment: Depression  Prior Outpatient Therapy Prior Outpatient Therapy: Yes Prior Therapy Dates: Last 2 years to current Prior Therapy Facilty/Provider(s): [facility] Reason for Treatment: ACTT team  Does patient have an ACCT team?: Yes Does patient have Intensive In-House Services?  : No Does patient have [facility] services? : No Does patient have P4CC services?: No  ADL Screening (condition at time of admission) Patient's cognitive ability adequate to safely complete daily activities?: Yes Is the  patient deaf or have difficulty hearing?: No Does the patient have difficulty seeing, even when wearing glasses/contacts?: No Does the patient have difficulty concentrating, remembering, or making decisions?: No Patient able to express need for assistance with ADLs?: Yes Does the patient have difficulty dressing or bathing?: No Independently performs ADLs?: Yes (appropriate for developmental age) Does the patient have difficulty walking or climbing stairs?: No Weakness of Legs: None  Home Assistive Devices/Equipment Home Assistive Devices/Equipment: None    Abuse/Neglect Assessment (Assessment to be complete while patient is alone) Abuse/Neglect Assessment Can Be Completed: Yes Physical Abuse: Denies Verbal Abuse: Denies Sexual Abuse: Denies Exploitation of patient/patient's resources: Denies Self-Neglect: Denies     Regulatory affairs officer (For Healthcare) Does Patient Have a Medical Advance Directive?: No Would patient like information on creating a medical advance directive?: No - Patient declined          Disposition:     This service was provided via telemedicine using a 2-way, interactive audio and video technology.  Names of all persons participating in this telemedicine service and their role in this encounter. Name: Tavis Kring Role: patient  Name: Curlene Dolphin, M.S. LCAS QP Role: clinician  Name:  Role:   Name:  Role:     [place]and[place] Lodge 01/26/2019 6:46 AM

## 2019-01-26 NOTE — ED Notes (Signed)
Lunch tray ordered 

## 2019-01-26 NOTE — ED Notes (Signed)
Verified with magistrate that IVC paperwork was received

## 2019-01-26 NOTE — ED Notes (Signed)
Pt's belongings - 1 labeled belongings bag and 1 backpack - placed at nurses' desk for inventory.

## 2019-01-26 NOTE — ED Notes (Signed)
Pt showered and now is lying on bed w/eyes closed. Respirations even, unlabored.

## 2019-01-26 NOTE — ED Notes (Signed)
Patient completing TTS reassessment at this time.

## 2019-01-26 NOTE — ED Notes (Signed)
Pt accepted to [facility].  Was told by intake PT had to be IVC.  Paperwork in process.  Pt will be transported 01/27/19

## 2019-01-26 NOTE — Care Management (Signed)
Writer informed the legal guardian of the patient placement.  The legal guardian was in agreement with the patient being placed at the facility.

## 2019-01-26 NOTE — Progress Notes (Signed)
CSW spoke with pt's aunt, Pearson Forster [phone]) to obtain collateral information. Ms. Allean Found stated that pt has been living with her until 4 days ago, when he left to stay with his mother. Pt and his mother reportedly have a strained relationship and an argument between two triggered his mental decompensation. Ms Allean Found states that pt is normally, "quiet and peaceful" when he is with her. She also reported that pt may return to live with her when he no longer meets inpatient criteria.   Audree Camel, LCSW, [place] Disposition [facility] [facility] [phone]

## 2019-01-26 NOTE — Progress Notes (Signed)
Pt meets inpatient criteria per Lindon Romp, NP. Referral information has been sent to the following hospitals for review:  [place]      Disposition will continue to follow.   Audree Camel, LCSW, [place] Disposition [facility] [phone]

## 2019-01-26 NOTE — ED Notes (Addendum)
Pt denies SI/HI. Pt voiced understanding he has had his allowed 2 phone calls for the day. Pt voiced understanding of tx plan - Reassess in AM by [facility]. Pt smiled when RN advised pt of plan. When asked why he was smiling, pt would not answer.

## 2019-01-26 NOTE — ED Notes (Signed)
IVC paper served.  Copy faxed to [facility].  Copy sent to medical records.  Original placed in folder for magistrate.  All 3 sets on clipboard

## 2019-01-26 NOTE — Consult Note (Signed)
Patient was just assessed at 06:45 this moring- during this reassessment at 11:30- NP unable to obtain collateral.  Kaynen is currently denying suicidal or homicidal ideations.  Reported " I lied" however did not elaborate.  Does report feeling depressed.  NP attempted to contact patient's aunt  [phone] Wyatt Mage, however no answer.  NP spoke to patient's grandparents Mikeal Hawthorne.  However grandfather is unable to attest to patient's behavior or feelings.  Continue with  Adora Fridge  recommendations for overnight observation.  patient to be reassessed in the morning.

## 2019-01-26 NOTE — ED Notes (Signed)
Breakfast ordered 

## 2019-01-26 NOTE — Care Management (Signed)
Patient accepted to [facility] Dr. Dareen Piano is the accepting  [phone] is the number to give report  The patient is abel to come anytime  Writer informed the RN Jacqlyn Larsen with the patient

## 2019-01-27 NOTE — ED Notes (Signed)
Pt up to take shower

## 2019-01-27 NOTE — ED Notes (Signed)
Report called to [facility] 

## 2019-01-27 NOTE — ED Notes (Signed)
Sheriffs dept. Called for transport

## 2019-02-22 ENCOUNTER — Observation Stay (HOSPITAL_COMMUNITY)
Admission: RE | Admit: 2019-02-22 | Discharge: 2019-02-24 | Disposition: A | Discharge status: Other Acute Inpatient Hospital | Payer: BC Managed Care – PPO | Attending: Psychiatry | Admitting: Psychiatry

## 2019-02-22 DIAGNOSIS — Z79899 Other long term (current) drug therapy: Secondary | ICD-10-CM | POA: Diagnosis not present

## 2019-02-22 DIAGNOSIS — R45850 Homicidal ideations: Secondary | ICD-10-CM | POA: Insufficient documentation

## 2019-02-22 DIAGNOSIS — Z590 Homelessness: Secondary | ICD-10-CM | POA: Diagnosis not present

## 2019-02-22 DIAGNOSIS — F319 Bipolar disorder, unspecified: Secondary | ICD-10-CM | POA: Diagnosis not present

## 2019-02-22 DIAGNOSIS — Z560 Unemployment, unspecified: Secondary | ICD-10-CM | POA: Insufficient documentation

## 2019-02-22 DIAGNOSIS — Z981 Arthrodesis status: Secondary | ICD-10-CM | POA: Diagnosis not present

## 2019-02-22 DIAGNOSIS — F603 Borderline personality disorder: Secondary | ICD-10-CM | POA: Diagnosis not present

## 2019-02-22 DIAGNOSIS — Z9114 Patient's other noncompliance with medication regimen: Secondary | ICD-10-CM | POA: Insufficient documentation

## 2019-02-22 DIAGNOSIS — Z20828 Contact with and (suspected) exposure to other viral communicable diseases: Secondary | ICD-10-CM | POA: Insufficient documentation

## 2019-02-22 DIAGNOSIS — F259 Schizoaffective disorder, unspecified: Secondary | ICD-10-CM | POA: Diagnosis present

## 2019-02-22 DIAGNOSIS — F913 Oppositional defiant disorder: Secondary | ICD-10-CM | POA: Diagnosis not present

## 2019-02-22 DIAGNOSIS — F200 Paranoid schizophrenia: Secondary | ICD-10-CM | POA: Diagnosis not present

## 2019-02-22 DIAGNOSIS — F1994 Other psychoactive substance use, unspecified with psychoactive substance-induced mood disorder: Secondary | ICD-10-CM | POA: Diagnosis present

## 2019-02-22 DIAGNOSIS — F17210 Nicotine dependence, cigarettes, uncomplicated: Secondary | ICD-10-CM | POA: Insufficient documentation

## 2019-02-22 DIAGNOSIS — F909 Attention-deficit hyperactivity disorder, unspecified type: Secondary | ICD-10-CM | POA: Insufficient documentation

## 2019-02-22 NOTE — Progress Notes (Signed)
Patient ID: [name], male   DOB: [birth year], 23 y.o.   MRN: [id number]  Pt lying in bed with blanket covered over his entire body. Pt refused to answers questions when asked by Probation officer. Pt refuse to allow writer to do his COVID-19 Test. Pt laid in the bed and said nothing. Pt refused to talk with Maudie Mercury, RN regarding in COVID-Test. Pt is very irritable and verbally aggressive towards writer and Maudie Mercury, RN Will continue to monitor for safety.

## 2019-02-22 NOTE — Plan of Care (Signed)
[place]  Reason for Crisis Plan:  Crisis Stabilization   Plan of Care:  Referral for Inpatient Hospitalization  Family Support:      Current Living Environment:  Living Arrangements: Other relatives  Insurance:   Hospital Account    Name Acct ID Class Status Primary Coverage   [name], [name] [id number] [place]        Guarantor Account (for Hospital Account [id number])    Name Relation to Pt Service Area Active? Acct Type   [name], [name]   Address Phone       [address] [place], [place] [address] [phone](H)          Coverage Information (for Hospital Account [id number])    F/O Payor/Plan Precert #   [facility] MEDICAID/[place] MEDICA[place]    Subscriber Subscriber #   [name], [name] [id number]   Address Phone   [address] [place], [place] [address] [phone]      Legal Guardian:     Primary Care Provider:  Patient, No Pcp Per  Current Outpatient Providers:  ACT  Psychiatrist:  Name of Psychiatrist: [facility]  Counselor/Therapist:  Name of Therapist: [facility]  Compliant with Medications:  No  Additional Information:   [name] 10/12/20208:44 PM

## 2019-02-22 NOTE — Progress Notes (Signed)
Pt lying in bed resting quietly with blanket covered over his face and body. Respirations unlabored. No apparent distress noted. Will continue to monitor for safety.

## 2019-02-22 NOTE — BH Assessment (Signed)
[facility] Assessment Progress Note  Case was staffed with Rankin NP who recommended patient be observed and monitored.      
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
## 2019-02-23 ENCOUNTER — Other Ambulatory Visit: Payer: Self-pay

## 2019-02-23 ENCOUNTER — Encounter (HOSPITAL_COMMUNITY): Payer: Self-pay | Admitting: Registered Nurse

## 2019-02-23 DIAGNOSIS — F603 Borderline personality disorder: Secondary | ICD-10-CM

## 2019-02-23 DIAGNOSIS — F062 Psychotic disorder with delusions due to known physiological condition: Secondary | ICD-10-CM | POA: Diagnosis not present

## 2019-02-23 DIAGNOSIS — F1994 Other psychoactive substance use, unspecified with psychoactive substance-induced mood disorder: Secondary | ICD-10-CM | POA: Diagnosis not present

## 2019-02-23 DIAGNOSIS — F200 Paranoid schizophrenia: Secondary | ICD-10-CM | POA: Diagnosis not present

## 2019-02-23 DIAGNOSIS — Z9114 Patient's other noncompliance with medication regimen: Secondary | ICD-10-CM

## 2019-02-23 DIAGNOSIS — R45850 Homicidal ideations: Secondary | ICD-10-CM

## 2019-02-23 MED ORDER — ZIPRASIDONE MESYLATE 20 MG IM SOLR: 20.0000 mg | Freq: Once | INTRAMUSCULAR | Status: DC

## 2019-02-23 MED ORDER — LORAZEPAM 2 MG/ML IJ SOLN
4.0000 mg | INTRAMUSCULAR | Status: DC | PRN
  Administered 2019-02-23 – 2019-02-24 (×2): 4 mg via INTRAMUSCULAR
  Filled 2019-02-23 (×2): qty 2

## 2019-02-23 MED ORDER — ZIPRASIDONE MESYLATE 20 MG IM SOLR
INTRAMUSCULAR | Status: AC
  Administered 2019-02-23: 10:00:00
  Filled 2019-02-23: qty 20

## 2019-02-23 MED ORDER — LORAZEPAM 2 MG/ML IJ SOLN: 2.0000 mg | Freq: Once | INTRAMUSCULAR | Status: DC

## 2019-02-23 MED ORDER — DIVALPROEX SODIUM 500 MG PO DR TAB: 500.0000 mg | DELAYED_RELEASE_TABLET | Freq: Three times a day (TID) | ORAL | Status: DC

## 2019-02-23 MED ORDER — TEMAZEPAM 15 MG PO CAPS: 45.0000 mg | ORAL_CAPSULE | Freq: Every day | ORAL | Status: DC

## 2019-02-23 MED ORDER — OLANZAPINE 10 MG PO TBDP: 20.0000 mg | ORAL_TABLET | Freq: Two times a day (BID) | ORAL | Status: DC

## 2019-02-23 MED ORDER — HALOPERIDOL LACTATE 5 MG/ML IJ SOLN
10.0000 mg | Freq: Four times a day (QID) | INTRAMUSCULAR | Status: DC | PRN
  Administered 2019-02-24: 10 mg via INTRAMUSCULAR
  Filled 2019-02-23 (×2): qty 2

## 2019-02-23 MED ORDER — HALOPERIDOL 5 MG PO TABS: 10.0000 mg | ORAL_TABLET | Freq: Four times a day (QID) | ORAL | Status: DC | PRN

## 2019-02-23 NOTE — BH Assessment (Signed)
[place] Assessment Progress Note  Per Johnn Hai, MD, this pt requires psychiatric hospitalization at this time.  Pt presents under IVC initiated by the [employer] ACT Team, which Orvis Brill, LCSW has upheld.  This Probation officer will seek placement for pt at area facilities, including [facility].  At 13:42 I called pt's mother/legal guardian, [name] [phone]), and notified her of disposition.  Please note that letter of guardianship is in pt's EPIC record under the Media tab.  Please keep pt's mother informed of pt's standing.   [place] Coordinator [phone]

## 2019-02-23 NOTE — BH Specialist Note (Signed)
Received a call from [facility] with [facility]. She requested lab results from patient. Verified with his nurse that he is refusing all labs (COVID included). Stanton Kidney states that patient is accepted to the wait list a this time. However, labs will be requested later.

## 2019-02-23 NOTE — Progress Notes (Signed)
Pt skin assessment completed. One mole noted to back of Right Upper Shoulder and front of chest. Old scars noted to left lower arm and top right hand. Pt has as small cut to Right palm. Pt was calm and cooperative with assessment.

## 2019-02-23 NOTE — Progress Notes (Signed)
Patient ID: [name], male   DOB: 1996-04-15, 23 y.o.   MRN: [id number] Pt lying in bed with blanket covered over face and body. Refuse to answer questions when asked by Probation officer. No apparent distress noted or complaints voiced. Will continue to maintain safety.

## 2019-02-23 NOTE — BH Assessment (Addendum)
[place] Assessment Progress Note  Per Johnn Hai, MD, this pt requires psychiatric hospitalization.  Pt presents under IVC initiated by the [employer] ACT Team, and upheld by Orvis Brill, LCSW.  At 16:09 this Probation officer spoke to [place] at the [facility] and obtained authorization for [facility] referral, authorization [id number] from 02/23/2019 - 03/01/2019.  Please note that authorization does not mean that pt has been accepted to the facility.  At 16:19 I called [facility] and spoke to EJ, who accepted demographic information by telephone.  As of this writing, referral information is in the process of being faxed to [facility].  At 16:37 I called the disposition social worker at [facility] at [phone], leaving a voice message requesting that they follow up with a call to [facility] at [phone] to confirm that they have received referral.  As of this writing a final decision is pending.   Jalene Mullet, MA Triage Specialist [phone]   Addendum:  Fax machine at [facility] indicates that fax has transmitted.  At 16:42 I called back to [facility] and spoke to [place].  He reports that they have received fax.  Final decision is still pending.  At 16:44 I called back to the [facility] disposition social worker, leaving a message advising them to disregard the last message.  Jalene Mullet, [place] Coordinator [phone]
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
## 2019-02-23 NOTE — Progress Notes (Signed)
Pt has been resting in bed this shift; no apparent distress noted or complaints voiced. Will continue to maintain safety.
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
## 2019-02-23 NOTE — BH Assessment (Signed)
[place] Assessment Progress Note  During my conversation with pt's mother/legal guardian, [name], she stated that pt has a history of effective treatment with [name].  I brought to her attention that pt's EPIC record shows that he is allergic to [name], and that he experiences elevated blood pressure and hives as a reaction.  She reports that this is false and that pt makes these claims to sabotage effective treatment.  Jalene Mullet, [place] Coordinator [phone]
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
## 2019-02-23 NOTE — Progress Notes (Signed)
Pt lying in bed awake; alert and oriented. Pt refused bedtime medications: Depakote DR 500 mg and Restoril 45 mg po.at 2200. Pt denies pain, SI, AVH at this time. Pt appears to be responding to internal stimuli. Smiles inappropriately during our conservation. Pt states that he wants to kill those who has hurt him-HI. Pt was offered a sandwich and chips; Pt stated that he would eat it only if their was a head on the plate. Will continue to maintain safety.

## 2019-02-24 ENCOUNTER — Other Ambulatory Visit: Payer: Self-pay

## 2019-02-24 ENCOUNTER — Inpatient Hospital Stay (HOSPITAL_COMMUNITY)
Admission: AD | Admit: 2019-02-24 | Discharge: 2019-03-09 | DRG: 885 | Disposition: A | Discharge status: Home / Self Care | Payer: BC Managed Care – PPO | Source: Intra-hospital | Attending: Psychiatry | Admitting: Psychiatry

## 2019-02-24 ENCOUNTER — Encounter (HOSPITAL_COMMUNITY): Payer: Self-pay

## 2019-02-24 DIAGNOSIS — Z7289 Other problems related to lifestyle: Secondary | ICD-10-CM

## 2019-02-24 DIAGNOSIS — Z9114 Patient's other noncompliance with medication regimen: Secondary | ICD-10-CM | POA: Diagnosis not present

## 2019-02-24 DIAGNOSIS — F17210 Nicotine dependence, cigarettes, uncomplicated: Secondary | ICD-10-CM | POA: Diagnosis present

## 2019-02-24 DIAGNOSIS — Z20828 Contact with and (suspected) exposure to other viral communicable diseases: Secondary | ICD-10-CM | POA: Diagnosis present

## 2019-02-24 DIAGNOSIS — F200 Paranoid schizophrenia: Secondary | ICD-10-CM | POA: Diagnosis not present

## 2019-02-24 DIAGNOSIS — F259 Schizoaffective disorder, unspecified: Principal | ICD-10-CM | POA: Diagnosis present

## 2019-02-24 DIAGNOSIS — R45850 Homicidal ideations: Secondary | ICD-10-CM | POA: Diagnosis present

## 2019-02-24 DIAGNOSIS — Z9119 Patient's noncompliance with other medical treatment and regimen: Secondary | ICD-10-CM

## 2019-02-24 DIAGNOSIS — F1994 Other psychoactive substance use, unspecified with psychoactive substance-induced mood disorder: Secondary | ICD-10-CM | POA: Diagnosis not present

## 2019-02-24 DIAGNOSIS — F209 Schizophrenia, unspecified: Secondary | ICD-10-CM | POA: Diagnosis not present

## 2019-02-24 LAB — SARS CORONAVIRUS 2 BY RT PCR (HOSPITAL ORDER, PERFORMED IN ~~LOC~~ HOSPITAL LAB): SARS Coronavirus 2: NEGATIVE

## 2019-02-24 MED ORDER — HALOPERIDOL LACTATE 5 MG/ML IJ SOLN
10.0000 mg | Freq: Four times a day (QID) | INTRAMUSCULAR | Status: DC | PRN
  Administered 2019-02-24 – 2019-02-26 (×3): 10 mg via INTRAMUSCULAR
  Filled 2019-02-24 (×2): qty 2

## 2019-02-24 MED ORDER — HALOPERIDOL 5 MG PO TABS: 10.0000 mg | ORAL_TABLET | Freq: Four times a day (QID) | ORAL | Status: DC | PRN

## 2019-02-24 MED ORDER — DIVALPROEX SODIUM 500 MG PO DR TAB
500.0000 mg | DELAYED_RELEASE_TABLET | Freq: Three times a day (TID) | ORAL | Status: DC
  Filled 2019-02-24 (×6): qty 1

## 2019-02-24 MED ORDER — TEMAZEPAM 15 MG PO CAPS
45.0000 mg | ORAL_CAPSULE | Freq: Every day | ORAL | Status: DC
  Administered 2019-02-24 – 2019-03-07 (×5): 45 mg via ORAL
  Filled 2019-02-24 (×7): qty 3

## 2019-02-24 MED ORDER — LORAZEPAM 2 MG/ML IJ SOLN
4.0000 mg | INTRAMUSCULAR | Status: DC | PRN
  Administered 2019-02-24 – 2019-02-26 (×3): 4 mg via INTRAMUSCULAR
  Filled 2019-02-24 (×2): qty 2

## 2019-02-24 MED ORDER — HALOPERIDOL LACTATE 5 MG/ML IJ SOLN
INTRAMUSCULAR | Status: AC
  Administered 2019-02-24: 14:00:00 10 mg via INTRAMUSCULAR
  Filled 2019-02-24: qty 2

## 2019-02-24 MED ORDER — LORAZEPAM 2 MG/ML IJ SOLN
INTRAMUSCULAR | Status: AC
  Administered 2019-02-24: 14:00:00 4 mg via INTRAMUSCULAR
  Filled 2019-02-24: qty 2

## 2019-02-24 MED ORDER — OLANZAPINE 10 MG PO TBDP
20.0000 mg | ORAL_TABLET | Freq: Two times a day (BID) | ORAL | Status: DC
  Filled 2019-02-24 (×4): qty 2

## 2019-02-24 NOTE — Discharge Summary (Signed)
  Patient to be transferred to inpatient at [facility]

## 2019-02-24 NOTE — Progress Notes (Signed)
Pt lying in bed resting with eyes closed. No apparent distress noted respiration even and unlabored. Will continue to maintain Pt safety.

## 2019-02-24 NOTE — Progress Notes (Signed)
Pt up to the nursing station threatening staff "I don't give a dam" "I just want to get out and kill my mom"

## 2019-02-24 NOTE — BHH Suicide Risk Assessment (Signed)
[facility] Admission Suicide Risk Assessment Total Time spent with patient: 45 minutes Principal Problem: Exacerbation of psychotic disorder and homicidality Diagnosis:  Active Problems:   Schizophrenia ([place])  Subjective Data: Patient threatening to kill mother- not taking his medications for schizophrenia  Continued Clinical Symptoms:    The "Alcohol Use Disorders Identification Test", Guidelines for Use in Primary Care, Second Edition.  World Pharmacologist [facility]). Score between 0-7:  no or low risk or alcohol related problems. Score between 8-15:  moderate risk of alcohol related problems. Score between 16-19:  high risk of alcohol related problems. Score 20 or above:  warrants further diagnostic evaluation for alcohol dependence and treatment.   CLINICAL FACTORS:   Alcohol/Substance Abuse/Dependencies Schizophrenia:   Less than 41 years old Personality Disorders:   Cluster C  Musculoskeletal: Strength & Muscle Tone: within normal limits Gait & Station: normal Patient leans: N/A  Psychiatric Specialty Exam: Physical Exam  Nursing note and vitals reviewed. Constitutional: He appears well-developed and well-nourished.    Review of Systems  Constitutional: Negative.   HENT: Negative.   Eyes: Negative.   Cardiovascular: Negative.   Gastrointestinal: Negative.   Skin: Negative.   Neurological: Negative.   Endo/Heme/Allergies: Negative.   COVID screening negative  There were no vitals taken for this visit.There is no height or weight on file to calculate BMI.  General Appearance: Disheveled  Eye Contact:  Minimal  Speech:  Pressured  Volume:  Increased  Mood:  Angry and Irritable/either threatening or ignoring examiners  Affect:  Restricted  Thought Process:  Irrelevant  Orientation:  Other:  Will not answer presumed to person place situation  Thought Content:  Illogical and Paranoid Ideation  Suicidal Thoughts:  No  Homicidal Thoughts:  Yes.  with intent/plan  Memory:   Immediate;   Poor Recent;   Poor Remote;   Fair  Judgement:  Poor  Insight:  Lacking  Psychomotor Activity:  NA  Concentration:  Concentration: Fair and Attention Span: Poor  Recall:  Poor  Fund of Knowledge:  Poor  Language:  Fair  Akathisia:  Negative  Handed:  Right  AIMS (if indicated):   0  Assets:  Armed forces logistics/support/administrative officer Physical Health Resilience  ADL's:  Intact  Cognition:  WNL  Sleep:         COGNITIVE FEATURES THAT CONTRIBUTE TO RISK:  Loss of executive function    SUICIDE RISK:   Moderate:  Frequent suicidal ideation with limited intensity, and duration, some specificity in terms of plans, no associated intent, good self-control, limited dysphoria/symptomatology, some risk factors present, and identifiable protective factors, including available and accessible social support.  PLAN OF CARE: admit   I certify that inpatient services furnished can reasonably be expected to improve the patient's condition.   Johnn Hai, MD 02/24/2019, 2:24 PM

## 2019-02-24 NOTE — BH Assessment (Signed)
[place] Assessment Progress Note  At 13:56 this Probation officer called pt's mother/legal guardian, [name] [phone]), and notified her that pt has been admitted to the Adult Unit at [facility].  She reports that she already has the phone number.  Jalene Mullet, [place] Coordinator [phone]

## 2019-02-24 NOTE — Progress Notes (Signed)
Pt up to the nursing station trying to intimidate staff, pt informed  that would not be tolerated at this facility. Pt had inappropriate smiling , and trying to make intimidating statements" I just need to kill them , that would salve it all" . Pt was offered medication , pt initially refused , pt stated he could not threaten staff , so he would have to take something. Pt agreed and appeared to take 45 mg Restoril.

## 2019-02-24 NOTE — BH Assessment (Signed)
[facility] Assessment Progress Note  Per Hampton Abbot, MD, this pt requires psychiatric hospitalization.  Heather, RN has assigned pt to [facility] Rm 506-1.  Pt presents under IVC initiated by pt's ACT Team ([facility]), and upheld by Orvis Brill, LCSW.  Pt's nurse, Legrand Como, has been notified.   Jalene Mullet, [place] Coordinator [phone]
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
## 2019-02-24 NOTE — Progress Notes (Signed)
Pt refused his 6am Medication: Depakote Dr tablet 500 mg-Pt states it's against his religion to take medication. Continues to stand in the doorway of his room. No apparent distress noted or complaints voiced. Will continue to maintain Pt safety.
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
## 2019-02-25 MED ORDER — HALOPERIDOL DECANOATE 100 MG/ML IM SOLN
150.0000 mg | Freq: Once | INTRAMUSCULAR | Status: AC
  Administered 2019-02-25: 150 mg via INTRAMUSCULAR
  Filled 2019-02-25: qty 1.5

## 2019-02-25 MED ORDER — HALOPERIDOL 5 MG PO TABS
10.0000 mg | ORAL_TABLET | Freq: Three times a day (TID) | ORAL | Status: DC
  Administered 2019-02-26 – 2019-03-09 (×28): 10 mg via ORAL
  Filled 2019-02-25 (×44): qty 2

## 2019-02-25 MED ORDER — DIPHENHYDRAMINE HCL 50 MG PO CAPS
50.0000 mg | ORAL_CAPSULE | Freq: Three times a day (TID) | ORAL | Status: DC
  Administered 2019-02-26 – 2019-03-09 (×27): 50 mg via ORAL
  Filled 2019-02-25: qty 2
  Filled 2019-02-25 (×32): qty 1
  Filled 2019-02-25: qty 2
  Filled 2019-02-25 (×10): qty 1

## 2019-02-25 NOTE — Progress Notes (Signed)
Psychoeducational Group Note  Date:  02/25/2019 Time:  2031  Group Topic/Focus:  Wrap-Up Group:   The focus of this group is to help patients review their daily goal of treatment and discuss progress on daily workbooks.  Participation Level: Did Not Attend  Participation Quality:  Not Applicable  Affect:  Not Applicable  Cognitive:  Not Applicable  Insight:  Not Applicable  Engagement in Group: Not Applicable  Additional Comments:  The patient did not attend group.   [name] S 02/25/2019, 8:31 PM

## 2019-02-25 NOTE — BHH Group Notes (Signed)
LCSW Group Therapy Note  Date and Time: 02/25/2019 @ 1:30pm  Type of Therapy and Topic: Group Therapy: Feelings Around Returning Home & Establishing a Supportive Framework and Supporting Oneself When Supports Not Available  Participation Level: B[facility] PARTICIPATION LEVEL: Did Not Attend  Mood: Did not attend    Description of Group:  Patients first processed thoughts and feelings about upcoming discharge. These included fears of upcoming changes, lack of change, new living environments, judgements and expectations from others and overall stigma of mental health issues. The group then discussed the definition of a supportive framework, what that looks and feels like, and how do to discern it from an unhealthy non-supportive network. The group identified different types of supports as well as what to do when your family/friends are less than helpful or unavailable     Therapeutic Goals   1.  Patient will identify one healthy supportive network that they can use at discharge.  2.  Patient will identify one factor of a supportive framework and how to tell it from an unhealthy network.  3.  Patient able to identify one coping skill to use when they do not have positive supports from others.  4.  Patient will demonstrate ability to communicate their needs through discussion and/or role plays.     Summary of Patient Progress:       Patient did not attend group today.      Therapeutic Modalities  Cognitive Behavioral Therapy  Motivational Interviewing   Legrande Hao, MSW, LCSW   
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
## 2019-02-25 NOTE — Progress Notes (Signed)
Pt up to the nursing station trying to be intimidating. "where's the doctor, what if someone gets fucked up, i'm not going to say a word about it, take me to jail I want to go to jail now" pt continues to try to be verbally intimidating . " I'm just going to fuck some stuff up then "
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
## 2019-02-25 NOTE — Progress Notes (Signed)
Patient ID: [name], male   DOB: 12/07/1995, 23 y.o.   MRN: [id number]  CSW resent a new referral for the patient for [facility] faxed the packet over at 1:00pm.

## 2019-02-25 NOTE — Progress Notes (Signed)
Patient ID: [name], male   DOB: August 14, 1995, 23 y.o.   MRN: [id number]   CSW met with the patient and discussed why the patient was upset. Patient discussed making phone calls to his aunt stating that he wants to kill her and kill himself. Patient stated that his family has his SSI money and he does not want them to continue getting his check. Patient stated that his mom just sends him to different places that his SSI will pay for him.  Patient stated that he feels out of control because he does not have any control and he will continue to "act up" if he gets getting medications.  Patient was given a Haldol shot while talking with CSW. Patient was pleasant and took the shot without any issues.  Patient was informed that his ACTT team counselor, Park Pope, will call him at 5pm to talk to him. Patient stated that he wants to talk to her when she calls.
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
## 2019-02-25 NOTE — Progress Notes (Signed)
Patient ID: [name], male   DOB: 14-May-1995, 23 y.o.   MRN: [id number]  CSW was contacted by [facility] ACTT, Park Pope. Billie asked to speak with the patient after talking with [name]. CSW informed Dalene Seltzer that the patient was asleep. CSW informed Dalene Seltzer that the [facility] referral was submitted.

## 2019-02-25 NOTE — Progress Notes (Signed)
Pt standing in his room making noise, then when confronted he denies. Pt presents with oppositional defiant behavior

## 2019-02-25 NOTE — Progress Notes (Signed)
"  at any given tim and any given moment I can slaughter anyone , [place]"

## 2019-02-25 NOTE — Progress Notes (Signed)
Pt up to the nursing station trying to say things " Im ready for motherfuckers to die"

## 2019-02-25 NOTE — Progress Notes (Signed)
Pt up asking  Where the doctor is, when asked why pt responded" It's time for him to die". Pt informed that is not going to happen. " I don't know why he thinks I'm like all these people in here that has to take medicine" pt informed that the quickest way for him to get out of her is to take his medication and get D/C. Pt educated on threatening people would lead him to places he would not like to go.

## 2019-02-25 NOTE — Progress Notes (Signed)
Recreation Therapy Notes  Date: 10.15.20 Time: 1000 Location:  [address] Dayroom  Group Topic: Communication, Team Building, Problem Solving  Goal Area(s) Addresses:  Patient will effectively work with peer towards shared goal.  Patient will identify skills used to make activity successful.  Patient will identify how skills used during activity can be used to reach post d/c goals.   Intervention: STEM Activity  Activity: Geophysicist/field seismologist. In teams patients were given 12 plastic drinking straws and a length of masking tape. Using the materials provided patients were asked to build a landing pad to catch a golf ball dropped from approximately 6 feet in the air.   Education: Education officer, community, Discharge Planning   Education Outcome: Acknowledges education/In group clarification offered/Needs additional education.   Clinical Observations/Feedback:  Pt did not attend group.    Victorino Sparrow, LRT/CTRS         Victorino Sparrow A 02/25/2019 12:09 PM

## 2019-02-25 NOTE — Progress Notes (Signed)
Pt up the nursing station I need a doctor to speak to. "he'll be dead"
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
## 2019-02-25 NOTE — Progress Notes (Signed)
[facility] MD Progress Note  02/25/2019 10:30 AM [name]  MRN:  [id number] Subjective:   Patient continues to make pretty much nonstop threats towards examiner, staff, family members so forth.  Inappropriate smiling and laughter regarding his homicidal statements and threats.  Required IM injections this morning due to threats toward staff. Principal Problem: Socio-apathy combined with schizophrenia Diagnosis: Active Problems:   Schizophrenia (HCC)  Total Time spent with patient: 20 minutes  Past Psychiatric History: Extensive clearly needs long-acting injectable  Past Medical History:  Past Medical History:  Diagnosis Date  . ADD (attention deficit disorder)   . 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 Anemia   . Anxiety   . Bipolar disorder (HCC)   . Borderline personality disorder (HCC)   . Central auditory processing disorder   . Deliberate self-cutting   . Depressed   . Eczema   . Headache(784.0)   . Oppositional defiant disorder   . Schizophrenia [facility])     Past Surgical History:  Procedure Laterality Date  . BACK SURGERY    . FRACTURE SURGERY    . NO PAST SURGERIES    . POSTERIOR LUMBAR FUSION N/A 02/19/2015   Procedure: LATERAL L-2 CORPECTOMY;  Surgeon: Lisbeth Renshaw, MD;  Location: [facility] OR;  Service: Neurosurgery;  Laterality: N/A;  . POSTERIOR LUMBAR FUSION 4 LEVEL  02/19/2015   Procedure: Posterior T-12 - L-4 STABILIZATION OF POSTERIOR LUMBAR;  Surgeon: Lisbeth Renshaw, MD;  Location: [facility] OR;  Service: Neurosurgery;;  . TIBIA IM NAIL INSERTION Left 02/20/2015   Procedure: INTRAMEDULLARY (IM) NAIL LEFT TIBIAL;  Surgeon: [name], MD;  Location: [facility] OR;  Service: Orthopedics;  Laterality: Left;   Family History: History reviewed. No pertinent family history. Family Psychiatric  History: No new data Social History:  Social History   Substance and Sexual Activity  Alcohol Use Yes  . Alcohol/week: 2.0  standard drinks  . Types: 2 Shots of liquor per week     Social History   Substance and Sexual Activity  Drug Use Yes  . Types: Cocaine, Marijuana   Comment: heroin    Social History   Socioeconomic History  . Marital status: Single    Spouse name: Not on file  . Number of children: Not on file  . Years of education: Not on file  . Highest education level: Not on file  Occupational History  . Occupation: Unemployed  Social Needs  . Financial resource strain: Not on file  . Food insecurity    Worry: Not on file    Inability: Not on file  . Transportation needs    Medical: Not on file    Non-medical: Not on file  Tobacco Use  . Smoking status: Current Every Day Smoker    Packs/day: 2.00    Types: Cigarettes  . Smokeless tobacco: Never Used  . Tobacco comment: 1-3 packs   Substance and Sexual Activity  . Alcohol use: Yes    Alcohol/week: 2.0 standard drinks    Types: 2 Shots of liquor per week  . Drug use: Yes    Types: Cocaine, Marijuana    Comment: heroin  . Sexual activity: Yes    Birth control/protection: None    Comment: pt reluctant to answer questions and frequently stated " I dont know"  Lifestyle  . Physical activity    Days per week: Not on file    Minutes per session: Not on file  . Stress: Not on file  Relationships  .  Social Musician on phone: Not on file    Gets together: Not on file    Attends religious service: Not on file    Active member of club or organization: Not on file    Attends meetings of clubs or organizations: Not on file    Relationship status: Not on file  Other Topics Concern  . Not on file  Social History Narrative   ** Merged History Encounter **    Pt reported that he is unemployed, homeless as his mother is in rehab   Additional Social History:                         Sleep: Fair  Appetite:  Fair  Current Medications: Current Facility-Administered Medications  Medication Dose Route Frequency  Provider Last Rate Last Dose  . divalproex (DEPAKOTE) DR tablet 500 mg  500 mg Oral Q8H Rankin, Shuvon B, NP      . haloperidol (HALDOL) tablet 10 mg  10 mg Oral Q6H PRN Rankin, Shuvon B, NP       Or  . haloperidol lactate (HALDOL) injection 10 mg  10 mg Intramuscular Q6H PRN Rankin, Shuvon B, NP   10 mg at 02/25/19 0815  . LORazepam (ATIVAN) injection 4 mg  4 mg Intramuscular Q4H PRN Rankin, Shuvon B, NP   4 mg at 02/25/19 0815  . OLANZapine zydis (ZYPREXA) disintegrating tablet 20 mg  20 mg Oral BID Rankin, Shuvon B, NP      . temazepam (RESTORIL) capsule 45 mg  45 mg Oral QHS Rankin, Shuvon B, NP   45 mg at 02/24/19 2235    Lab Results:  Results for orders placed or performed during the hospital encounter of 02/22/19 (from the past 48 hour(s))  SARS Coronavirus 2 by RT PCR (hospital order, performed in [facility] hospital lab) Nasopharyngeal     Status: None   Collection Time: 02/23/19 10:45 PM   Specimen: Nasopharyngeal  Result Value Ref Range   SARS Coronavirus 2 NEGATIVE NEGATIVE    Comment: (NOTE) If result is NEGATIVE SARS-CoV-2 target nucleic acids are NOT DETECTED. The SARS-CoV-2 RNA is generally detectable in upper and lower  respiratory specimens during the acute phase of infection. The lowest  concentration of SARS-CoV-2 viral copies this assay can detect is 250  copies / mL. A negative result does not preclude SARS-CoV-2 infection  and should not be used as the sole basis for treatment or other  patient management decisions.  A negative result may occur with  improper specimen collection / handling, submission of specimen other  than nasopharyngeal swab, presence of viral mutation(s) within the  areas targeted by this assay, and inadequate number of viral copies  (<250 copies / mL). A negative result must be combined with clinical  observations, patient history, and epidemiological information. If result is POSITIVE SARS-CoV-2 target nucleic acids are DETECTED. The  SARS-CoV-2 RNA is generally detectable in upper and lower  respiratory specimens dur ing the acute phase of infection.  Positive  results are indicative of active infection with SARS-CoV-2.  Clinical  correlation with patient history and other diagnostic information is  necessary to determine patient infection status.  Positive results do  not rule out bacterial infection or co-infection with other viruses. If result is PRESUMPTIVE POSTIVE SARS-CoV-2 nucleic acids MAY BE PRESENT.   A presumptive positive result was obtained on the submitted specimen  and confirmed on repeat testing.  While  2019 novel coronavirus  (SARS-CoV-2) nucleic acids may be present in the submitted sample  additional confirmatory testing may be necessary for epidemiological  and / or clinical management purposes  to differentiate between  SARS-CoV-2 and other Sarbecovirus currently known to infect humans.  If clinically indicated additional testing with an alternate test  methodology [phone]([id number]) is advised. The SARS-CoV-2 RNA is generally  detectable in upper and lower respiratory sp ecimens during the acute  phase of infection. The expected result is Negative. Fact Sheet for Patients:  BoilerBrush.com.cyhttps://www.fda.gov/media/136312/download Fact Sheet for Healthcare Providers: https://pope.com/https://www.fda.gov/media/136313/download This test is not yet approved or cleared by the [place] FDA and has been authorized for detection and/or diagnosis of SARS-CoV-2 by FDA under an Emergency Use Authorization (EUA).  This EUA will remain in effect (meaning this test can be used) for the duration of the COVID-19 declaration under Section 564(b)(1) of the Act, 21 U.S.C. section 360bbb-3(b)(1), unless the authorization is terminated or revoked sooner. Performed at [facility], [address] [place], [place] [address]     Blood Alcohol level:  Lab Results  Component Value Date   ETH <10 01/26/2019   ETH <10  12/30/2018    Metabolic Disorder Labs: Lab Results  Component Value Date   HGBA1C 5.4 06/05/2016   MPG 108 06/05/2016   MPG 108 05/16/2016   Lab Results  Component Value Date   PROLACTIN 32.7 (H) 02/22/2013   Lab Results  Component Value Date   CHOL 156 06/05/2016   TRIG 73 06/05/2016   HDL 56 06/05/2016   CHOLHDL 2.8 06/05/2016   VLDL 15 06/05/2016   LDLCALC 85 06/05/2016   LDLCALC 105 (H) 05/16/2016    Physical Findings: AIMS: Facial and Oral Movements Muscles of Facial Expression: None, normal Lips and Perioral Area: None, normal Jaw: None, normal Tongue: None, normal,Extremity Movements Upper (arms, wrists, hands, fingers): None, normal Lower (legs, knees, ankles, toes): None, normal, Trunk Movements Neck, shoulders, hips: None, normal, Overall Severity Severity of abnormal movements (highest score from questions above): None, normal Incapacitation due to abnormal movements: None, normal Patient's awareness of abnormal movements (rate only patient's report): No Awareness, Dental Status Current problems with teeth and/or dentures?: No Does patient usually wear dentures?: No  CIWA:    COWS:     Musculoskeletal: Strength & Muscle Tone: within normal limits Gait & Station: normal Patient leans: N/A  Psychiatric Specialty Exam: Physical Exam  ROS  There were no vitals taken for this visit.There is no height or weight on file to calculate BMI.  General Appearance: Disheveled  Eye Contact:  Fair  Speech:  Pressured  Volume:  Decreased  Mood:  Angry and Irritable  Affect:  Restricted  Thought Process:  Linear and Descriptions of Associations: Circumstantial  Orientation:  Other:  Will not elaborate  Thought Content:  Focused on homicidal threats  Suicidal Thoughts:  No  Homicidal Thoughts:  Yes.  with intent/plan  Memory:  Immediate;   Poor Recent;   Poor Remote;   Poor  Judgement:  Impaired  Insight:  Lacking  Psychomotor Activity:  Normal   Concentration:  Concentration: Poor and Attention Span: Poor  Recall:  Poor  Fund of Knowledge:  Poor  Language:  Fair  Akathisia:  Negative  Handed:  Right  AIMS (if indicated):     Assets:  Leisure Time Physical Health  ADL's:  Intact  Cognition:  WNL  Sleep:  Number of Hours: 8.75     Treatment Plan Summary: Daily  contact with patient to assess and evaluate symptoms and progress in treatment  Due to acute and chronic dangerousness we will administer long-acting injectable haloperidol to treat psychosis and help with behavioral issues continue to monitor for safety continue to keep staff safe with as needed medications as needed  Raizel Wesolowski, MD 02/25/2019, 10:30 AM

## 2019-02-26 MED ORDER — LITHIUM CARBONATE ER 450 MG PO TBCR
450.0000 mg | EXTENDED_RELEASE_TABLET | Freq: Two times a day (BID) | ORAL | Status: DC
  Administered 2019-02-26 – 2019-03-09 (×17): 450 mg via ORAL
  Filled 2019-02-26 (×29): qty 1

## 2019-02-26 NOTE — Progress Notes (Signed)
Recreation Therapy Notes  Date: 10.16.20 Time: 1015 Location: [address] Dayroom  Group Topic: Self-Esteem  Goal Area(s) Addresses:  Patient will successfully identify positive attributes about themselves.  Patient will successfully identify benefit of improved self-esteem.   Intervention: Markers, colored pencils, construction paper, outline of license plate, music  Activity: Personalized Plate.  Patients were to create a personalized licensed plate that highlighted all of the positive qualities about them and anything that makes them unique.  Education:  Self-Esteem, Discharge Planning.   Education Outcome: Acknowledges education/In group clarification offered/Needs additional education  Clinical Observations/Feedback: Pt did not attend group.    Camy Leder, LRT/CTRS         Shenetta Schnackenberg A 02/26/2019 12:34 PM 
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
## 2019-02-26 NOTE — Tx Team (Addendum)
Interdisciplinary Treatment and Diagnostic Plan Update  02/26/2019 Time of Session: 10:05am [name] MRN: [id number]  Principal Diagnosis: <principal problem not specified>  Secondary Diagnoses: Active Problems:   Schizophrenia (HCC)   Current Medications:  Current Facility-Administered Medications  Medication Dose Route Frequency Provider Last Rate Last Dose  . diphenhydrAMINE (BENADRYL) capsule 50 mg  50 mg Oral TID Malvin Johns, MD      . haloperidol (HALDOL) tablet 10 mg  10 mg Oral Q6H PRN Rankin, Shuvon B, NP       Or  . haloperidol lactate (HALDOL) injection 10 mg  10 mg Intramuscular Q6H PRN Rankin, Shuvon B, NP   10 mg at 02/26/19 0936  . haloperidol (HALDOL) tablet 10 mg  10 mg Oral TID Malvin Johns, MD      . lithium carbonate (ESKALITH) CR tablet 450 mg  450 mg Oral Q12H Malvin Johns, MD      . LORazepam (ATIVAN) injection 4 mg  4 mg Intramuscular Q4H PRN Rankin, Shuvon B, NP   4 mg at 02/26/19 0937  . temazepam (RESTORIL) capsule 45 mg  45 mg Oral QHS Rankin, Shuvon B, NP   45 mg at 02/25/19 2228   PTA Medications: Medications Prior to Admission  Medication Sig Dispense Refill Last Dose  . ARIPiprazole (ABILIFY IM) Inject 400 mg into the muscle every 30 (thirty) days.        Patient Stressors:    Patient Strengths:    Treatment Modalities: Medication Management, Group therapy, Case management,  1 to 1 session with clinician, Psychoeducation, Recreational therapy.   Physician Treatment Plan for Primary Diagnosis: <principal problem not specified> Long Term Goal(s): Improvement in symptoms so as ready for discharge Improvement in symptoms so as ready for discharge   Short Term Goals: Ability to disclose and discuss suicidal ideas Ability to identify and develop effective coping behaviors will improve Ability to maintain clinical measurements within normal limits will improve Compliance with prescribed medications will improve Ability to  identify and develop effective coping behaviors will improve Ability to maintain clinical measurements within normal limits will improve Compliance with prescribed medications will improve  Medication Management: Evaluate patient's response, side effects, and tolerance of medication regimen.  Therapeutic Interventions: 1 to 1 sessions, Unit Group sessions and Medication administration.  Evaluation of Outcomes: Not Progressing  Physician Treatment Plan for Secondary Diagnosis: Active Problems:   Schizophrenia (HCC)  Long Term Goal(s): Improvement in symptoms so as ready for discharge Improvement in symptoms so as ready for discharge   Short Term Goals: Ability to disclose and discuss suicidal ideas Ability to identify and develop effective coping behaviors will improve Ability to maintain clinical measurements within normal limits will improve Compliance with prescribed medications will improve Ability to identify and develop effective coping behaviors will improve Ability to maintain clinical measurements within normal limits will improve Compliance with prescribed medications will improve     Medication Management: Evaluate patient's response, side effects, and tolerance of medication regimen.  Therapeutic Interventions: 1 to 1 sessions, Unit Group sessions and Medication administration.  Evaluation of Outcomes: Not Progressing   RN Treatment Plan for Primary Diagnosis: <principal problem not specified> Long Term Goal(s): Knowledge of disease and therapeutic regimen to maintain health will improve  Short Term Goals: Ability to participate in decision making will improve, Ability to verbalize feelings will improve, Ability to disclose and discuss suicidal ideas, Ability to identify and develop effective coping behaviors will improve and Compliance with prescribed medications will improve  Medication Management: RN will administer medications as ordered by provider, will assess and  evaluate patient's response and provide education to patient for prescribed medication. RN will report any adverse and/or side effects to prescribing provider.  Therapeutic Interventions: 1 on 1 counseling sessions, Psychoeducation, Medication administration, Evaluate responses to treatment, Monitor vital signs and CBGs as ordered, Perform/monitor CIWA, COWS, AIMS and Fall Risk screenings as ordered, Perform wound care treatments as ordered.  Evaluation of Outcomes: Not Progressing   LCSW Treatment Plan for Primary Diagnosis: <principal problem not specified> Long Term Goal(s): Safe transition to appropriate next level of care at discharge, Engage patient in therapeutic group addressing interpersonal concerns.  Short Term Goals: Engage patient in aftercare planning with referrals and resources and Increase skills for wellness and recovery  Therapeutic Interventions: Assess for all discharge needs, 1 to 1 time with Social worker, Explore available resources and support systems, Assess for adequacy in community support network, Educate family and significant other(s) on suicide prevention, Complete Psychosocial Assessment, Interpersonal group therapy.  Evaluation of Outcomes: Not Progressing   Progress in Treatment: Attending groups: No. Participating in groups: No. Taking medication as prescribed: No. Toleration medication: No. Family/Significant other contact made: Yes, individual(s) contacted:  pt's legal guardian/mother Patient understands diagnosis: No. Discussing patient identified problems/goals with staff: Yes. Medical problems stabilized or resolved: Yes. Denies suicidal/homicidal ideation: No.; SI Issues/concerns per patient self-inventory: No. Other:   New problem(s) identified: No, Describe:  None  New Short Term/Long Term Goal(s): Medication stabilization, elimination of SI thoughts, and development of a comprehensive mental wellness plan.   Patient Goals:  Patient did  not attend treatment team.  Discharge Plan or Barriers: Refer has been sent for [facility].  Reason for Continuation of Hospitalization: SI, Aggressive, Medication management   Estimated Length of Stay: 5-7 days    Attendees: Patient: 02/26/2019   Physician: Dr. Johnn Hai, MD 02/26/2019   Nursing: Legrand Como, RN 02/26/2019   RN Care Manager: 02/26/2019   Social Worker: Ardelle Anton, LCSW 02/26/2019   Recreational Therapist:  02/26/2019   MSW Intern: Ovidio Kin 02/26/2019   Other:  02/26/2019   Other: 02/26/2019      Scribe for Treatment Team: Trecia Rogers, LCSW 02/26/2019 10:59 AM
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
## 2019-02-26 NOTE — Progress Notes (Signed)
[facility] MD Progress Note  02/26/2019 3:32 PM [name]  MRN:  [id number] Subjective:   Patient's pattern thus far in the hospital has not changed he will are to be alert and oriented however during these times he is constantly threatening to kill staff and family members, or he is sedated from the medications needed due to his threats to assault and kill staff.  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 ADD (attention deficit disorder)   . 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 Anemia   . Anxiety   . Bipolar disorder ([place])   . Borderline personality disorder ([place])   . Central auditory processing disorder   . Deliberate self-cutting   . Depressed   . Eczema   . Headache(784.0)   . Oppositional defiant disorder   . Schizophrenia [facility])     Past Surgical History:  Procedure Laterality Date  . BACK SURGERY    . FRACTURE SURGERY    . NO PAST SURGERIES    . POSTERIOR LUMBAR FUSION N/A 02/19/2015   Procedure: LATERAL L-2 CORPECTOMY;  Surgeon: [name], MD;  Location: [place];  Service: Neurosurgery;  Laterality: N/A;  . POSTERIOR LUMBAR FUSION 4 LEVEL  02/19/2015   Procedure: Posterior T-12 - L-4 STABILIZATION OF POSTERIOR LUMBAR;  Surgeon: [name], MD;  Location: [place];  Service: Neurosurgery;;  . TIBIA IM NAIL INSERTION Left 02/20/2015   Procedure: INTRAMEDULLARY (IM) NAIL LEFT TIBIAL;  Surgeon: Rod Can, MD;  Location: [place];  Service: Orthopedics;  Laterality: Left;   Family History: History reviewed.  No pertinent family history. Family Psychiatric  History: Jon Gills is an untreated schizophrenic who seems to have filled the patient with a good deal of hostility towards others Social History:  Social History   Substance and Sexual Activity  Alcohol Use Yes  . Alcohol/week: 2.0 standard drinks  . Types: 2 Shots of liquor per week     Social History   Substance and Sexual Activity  Drug Use Yes  . Types: Cocaine, Marijuana   Comment: heroin    Social History   Socioeconomic History  . Marital status: Single    Spouse name: Not on file  . Number of children: Not on file  . Years of education: Not on file  . Highest education level: Not on file  Occupational History  . Occupation: Unemployed  Social Needs  . Financial resource strain: Not on file  . Food insecurity    Worry: Not on file    Inability: Not on file  . Transportation needs    Medical: Not on file    Non-medical: Not on file  Tobacco Use  . Smoking status: Current Every Day Smoker    Packs/day: 2.00    Types: Cigarettes  . Smokeless tobacco: Never Used  . Tobacco comment: 1-3 packs   Substance and Sexual Activity  . Alcohol use: Yes    Alcohol/week: 2.0 standard drinks    Types: 2 Shots of liquor per week  . Drug use: Yes    Types: Cocaine, Marijuana    Comment: heroin  . Sexual activity: Yes    Birth  control/protection: None    Comment: pt reluctant to answer questions and frequently stated " I dont know"  Lifestyle  . Physical activity    Days per week: Not on file    Minutes per session: Not on file  . Stress: Not on file  Relationships  . Social Musician on phone: Not on file    Gets together: Not on file    Attends religious service: Not on file    Active member of club or organization: Not on file    Attends meetings of clubs or organizations: Not on file    Relationship status: Not on file  Other Topics Concern  . Not on file  Social History Narrative   ** Merged  History Encounter **    Pt reported that he is unemployed, homeless as his mother is in rehab   Additional Social History:                         Sleep: Good  Appetite:  Good  Current Medications: Current Facility-Administered Medications  Medication Dose Route Frequency Provider Last Rate Last Dose  . diphenhydrAMINE (BENADRYL) capsule 50 mg  50 mg Oral TID Malvin Johns, MD      . haloperidol (HALDOL) tablet 10 mg  10 mg Oral Q6H PRN Rankin, Shuvon B, NP       Or  . haloperidol lactate (HALDOL) injection 10 mg  10 mg Intramuscular Q6H PRN Rankin, Shuvon B, NP   10 mg at 02/26/19 0936  . haloperidol (HALDOL) tablet 10 mg  10 mg Oral TID Malvin Johns, MD      . lithium carbonate (ESKALITH) CR tablet 450 mg  450 mg Oral Q12H Malvin Johns, MD      . LORazepam (ATIVAN) injection 4 mg  4 mg Intramuscular Q4H PRN Rankin, Shuvon B, NP   4 mg at 02/26/19 0937  . temazepam (RESTORIL) capsule 45 mg  45 mg Oral QHS Rankin, Shuvon B, NP   45 mg at 02/25/19 2228    Lab Results: No results found for this or any previous visit (from the past 48 hour(s)).  Blood Alcohol level:  Lab Results  Component Value Date   ETH <10 01/26/2019   ETH <10 12/30/2018    Metabolic Disorder Labs: Lab Results  Component Value Date   HGBA1C 5.4 06/05/2016   MPG 108 06/05/2016   MPG 108 05/16/2016   Lab Results  Component Value Date   PROLACTIN 32.7 (H) 02/22/2013   Lab Results  Component Value Date   CHOL 156 06/05/2016   TRIG 73 06/05/2016   HDL 56 06/05/2016   CHOLHDL 2.8 06/05/2016   VLDL 15 06/05/2016   LDLCALC 85 06/05/2016   LDLCALC 105 (H) 05/16/2016    Physical Findings: AIMS: Facial and Oral Movements Muscles of Facial Expression: None, normal Lips and Perioral Area: None, normal Jaw: None, normal Tongue: None, normal,Extremity Movements Upper (arms, wrists, hands, fingers): None, normal Lower (legs, knees, ankles, toes): None, normal, Trunk Movements Neck, shoulders,  hips: None, normal, Overall Severity Severity of abnormal movements (highest score from questions above): None, normal Incapacitation due to abnormal movements: None, normal Patient's awareness of abnormal movements (rate only patient's report): No Awareness, Dental Status Current problems with teeth and/or dentures?: No Does patient usually wear dentures?: No  CIWA:    COWS:     Musculoskeletal: Strength & Muscle Tone: within normal limits Gait & Station: normal Patient  leans: N/A  Psychiatric Specialty Exam: Physical Exam  ROS  Blood pressure (!) 92/54, pulse 86, temperature 99 F (37.2 C), temperature source Oral.There is no height or weight on file to calculate BMI.  General Appearance: Casual  Eye Contact:  Fair  Speech:  Clear and Coherent  Volume:  Normal  Mood:  Irritable and Calm demeanor while threatening others  Affect:  Non-Congruent  Thought Process:  Linear and Descriptions of Associations: Loose  Orientation:  Other:  Will not answer  Thought Content:  Illogical  Suicidal Thoughts:  No  Homicidal Thoughts:  Yes.  with intent/plan  Memory:  Immediate;   Fair Recent;   Fair Remote;   Fair  Judgement:  Impaired  Insight:  Lacking  Psychomotor Activity:  Decreased  Concentration:  Concentration: Fair and Attention Span: Fair  Recall:  FiservFair  Fund of Knowledge:  Fair  Language:  Fair  Akathisia:  Negative  Handed:  Right  AIMS (if indicated):     Assets:  Leisure Time Physical Health  ADL's:  Intact  Cognition:  WNL  Sleep:  Number of Hours: 9.5     Treatment Plan Summary: Daily contact with patient to assess and evaluate symptoms and progress in treatment and Medication management  Once again required IM medications hopefully will comply with orals and hopefully will achieve some longer term stability with long-acting-has responded to clozapine in the past according to family however would not comply and will not comply with blood draw  Malvin JohnsFARAH,Inanna Telford,  MD 02/26/2019, 3:32 PM

## 2019-02-26 NOTE — Progress Notes (Signed)
Patient ID: [name], male   DOB: [birth year], 23 y.o.   MRN: [id number]   CSW refaxed the packet for the patient to [facility].   [facility] received the packet and took demographic information at 2:15pm.   [place] will send information to nurse and place him on the waitlist.   [place] number: [phone].

## 2019-02-26 NOTE — Progress Notes (Signed)
Spirituality group facilitated by Chaplain Christa Fasig, MDiv, BCC.  Group Description:  Group focused on topic of hope.  Patients participated in facilitated discussion around topic, connecting with one another around experiences and definitions for hope.  Group members engaged with the sentence "hope is," reflecting on what hope looks like for them today.  Group engaged in discussion around how their definitions of hope are present today in hospital.   Modalities: Psycho-social ed, Adlerian, Narrative, MI Patient Progress: Did not attend  

## 2019-02-27 DIAGNOSIS — F209 Schizophrenia, unspecified: Secondary | ICD-10-CM

## 2019-02-27 MED ORDER — SENNA 8.6 MG PO TABS
1.0000 | ORAL_TABLET | Freq: Every day | ORAL | Status: DC | PRN
  Administered 2019-02-28 – 2019-03-01 (×2): 8.6 mg via ORAL
  Filled 2019-02-27 (×2): qty 1

## 2019-02-27 MED ORDER — MAGNESIUM HYDROXIDE 400 MG/5ML PO SUSP
30.0000 mL | Freq: Every day | ORAL | Status: DC | PRN
  Administered 2019-02-28: 30 mL via ORAL
  Filled 2019-02-27: qty 30

## 2019-02-27 NOTE — B[facility] Counselor (Signed)
Clinical Social Work Note  Request was received from [facility] admissions office for additional information.  They request that labs be done on patient and forwarded.  UDS has been ordered and will be faxed as soon as received.  They request medical history.  H&P was faxed.  They request alcohol history.  It was pointed out that this is in the H&P.  They request information about patient's anemia.  Notes from 2016 when this first appeared were faxed.  Awaiting call back.  Selmer Dominion, LCSW 02/27/2019, 1:47 PM

## 2019-02-27 NOTE — [facility] Group Notes (Signed)
LCSW Group Therapy Note  02/27/2019   11:15am-12:00pm   Type of Therapy and Topic:  Group Therapy: Anger   Participation Level:  Did Not Attend   Description of Group:   In this group, patients identified a recent time they became angry and how they reacted.  They analyzed how their reaction was possibly beneficial and how it was possibly unhelpful.  They learned how to recognize the physical responses they have to anger-provoking situations that can alert them to the possibility that they are becoming angry.  They also discussed how anger often arises from conflict with our value system, which is different from one person to another.  Therapeutic Goals: 1. Patients will remember their last incident of anger and how they felt emotionally and physically. 2. Patients will identify how their behavior at that time worked for them, as well as how it worked against them. 3. Patients will explore the idea that people's value systems differ and can affect anger 4. Patients will learn that anger itself is normal and is "a sign something needs to change."  Summary of Patient Progress:  Did not attend  Therapeutic Modalities:   Cognitive Behavioral Therapy  M[place]all Will J Grossman-Orr, LCSW  

## 2019-02-27 NOTE — Progress Notes (Signed)
Patient ID: [name], male   DOB: January 27, 1996, 23 y.o.   MRN: [id number]   [place] NOVEL CORONAVIRUS (COVID-19) DAILY CHECK-OFF SYMPTOMS - answer yes or no to each - every day NO YES  Have you had a fever in the past 24 hours?  . Fever (Temp > 37.80C / 100F) X   Have you had any of these symptoms in the past 24 hours? . New Cough .  Sore Throat  .  Shortness of Breath .  Difficulty Breathing .  Unexplained Body Aches   X   Have you had any one of these symptoms in the past 24 hours not related to allergies?   . Runny Nose .  Nasal Congestion .  Sneezing   X   If you have had runny nose, nasal congestion, sneezing in the past 24 hours, has it worsened?  X   EXPOSURES - check yes or no X   Have you traveled outside the state in the past 14 days?  X   Have you been in contact with someone with a confirmed diagnosis of COVID-19 or PUI in the past 14 days without wearing appropriate PPE?  X   Have you been living in the same home as a person with confirmed diagnosis of COVID-19 or a PUI (household contact)?    X   Have you been diagnosed with COVID-19?    X              What to do next: Answered NO to all: Answered YES to anything:   Proceed with unit schedule Follow the [facility] Inpatient Flowsheet.

## 2019-02-27 NOTE — Plan of Care (Signed)
  Problem: Coping: Goal: Ability to identify and develop effective coping behavior will improve Outcome: Not Progressing Goal: Ability to interact with others will improve Outcome: Not Progressing

## 2019-02-27 NOTE — [facility] Counselor (Signed)
Clinical Social Work Note  CSW contacted [facility] Admissions office to check on patient's status.  They requested additional labs, as the ones sent in packet are very old.  The sole lab available from this admission was a COVID-19 test, which was faxed along with yesterday's doctor's note.  This will go to medical staff to review.   Likely to request updated labs to be done.  Selmer Dominion, LCSW 02/27/2019, 9:11 AM

## 2019-02-27 NOTE — Progress Notes (Signed)
[facility] Group Notes:  (Nursing/MHT/Case Management/Adjunct)  Date:  02/27/2019  Time:  0900  Type of Therapy:  Nurse Education  Participation Level:  Did Not Attend   

## 2019-02-27 NOTE — Progress Notes (Addendum)
[facility]  02/27/2019 11:58 AM [name]  MRN:  [id number]  Evaluation:  NP attempted to assess patient however patient has not responded to questions.  Patient observed resting in bed with covers pulled tightly.  Patient does not appear to be in distress at this time.  Staff reports patient can be agitated and volatile.  Per nurse patient did take morning medications by mouth.  Case staffed with attending psychiatrist will continue with medications as directed.  CSW to continue [facility] weight list   referral.  As reported patient has threatened to kill family and staff according to chart.  We will continue to monitor for safety.Support,encouragement,reassurance was provided.    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 ADD (attention deficit disorder)   . 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 Anemia   . Anxiety   . Bipolar disorder (HCC)   . Borderline personality disorder (HCC)   . Central auditory processing disorder   . Deliberate self-cutting   . Depressed   . Eczema   . Headache(784.0)   . Oppositional defiant disorder   . Schizophrenia [facility](HCC)     Past Surgical History:  Procedure Laterality Date  . BACK SURGERY    . FRACTURE SURGERY    . NO PAST SURGERIES    . POSTERIOR LUMBAR FUSION N/A 02/19/2015   Procedure: LATERAL L-2 CORPECTOMY;  Surgeon: Lisbeth RenshawNeelesh Nundkumar, MD;  Location: [facility] OR;  Service: Neurosurgery;  Laterality: N/A;  . POSTERIOR LUMBAR FUSION 4 LEVEL  02/19/2015   Procedure: Posterior T-12 - L-4 STABILIZATION OF POSTERIOR LUMBAR;  Surgeon: Lisbeth RenshawNeelesh Nundkumar, MD;  Location: [facility] OR;   Service: Neurosurgery;;  . TIBIA IM NAIL INSERTION Left 02/20/2015   Procedure: INTRAMEDULLARY (IM) NAIL LEFT TIBIAL;  Surgeon: Samson FredericBrian Swinteck, MD;  Location: [facility] OR;  Service: Orthopedics;  Laterality: Left;   Family History: History reviewed. No pertinent family history. Family Psychiatric  History: Emelia LoronGrandfather is an untreated schizophrenic who seems to have filled the patient with a good deal of hostility towards others Social History:  Social History   Substance and Sexual Activity  Alcohol Use Yes  . Alcohol/week: 2.0 standard drinks  . Types: 2 Shots of liquor per week     Social History   Substance and Sexual Activity  Drug Use Yes  . Types: Cocaine, Marijuana   Comment: heroin    Social History   Socioeconomic History  . Marital status: Single    Spouse name: Not on file  . Number of children: Not on file  . Years of education: Not on file  . Highest education level: Not on file  Occupational History  . Occupation: Unemployed  Social Needs  . Financial resource strain: Not on file  . Food insecurity    Worry: Not on file    Inability: Not on file  . Transportation needs    Medical: Not on file    Non-medical: Not on file  Tobacco Use  . Smoking status: Current Every Day Smoker    Packs/day: 2.00    Types: Cigarettes  . Smokeless tobacco: Never Used  . Tobacco comment: 1-3 packs   Substance and Sexual Activity  . Alcohol use: Yes    Alcohol/week: 2.0  standard drinks    Types: 2 Shots of liquor per week  . Drug use: Yes    Types: Cocaine, Marijuana    Comment: heroin  . Sexual activity: Yes    Birth control/protection: None    Comment: pt reluctant to answer questions and frequently stated " I dont know"  Lifestyle  . Physical activity    Days per week: Not on file    Minutes per session: Not on file  . Stress: Not on file  Relationships  . Social Herbalist on phone: Not on file    Gets together: Not on file    Attends religious service:  Not on file    Active member of club or organization: Not on file    Attends meetings of clubs or organizations: Not on file    Relationship status: Not on file  Other Topics Concern  . Not on file  Social History Narrative   ** Merged History Encounter **    Pt reported that he is unemployed, homeless as his mother is in rehab   Additional Social History:                         Sleep: Good  Appetite:  Good  Current Medications: Current Facility-Administered Medications  Medication Dose Route Frequency Provider Last Rate Last Dose  . diphenhydrAMINE (BENADRYL) capsule 50 mg  50 mg Oral TID Johnn Hai, MD   50 mg at 02/27/19 1147  . haloperidol (HALDOL) tablet 10 mg  10 mg Oral Q6H PRN Rankin, Shuvon B, NP       Or  . haloperidol lactate (HALDOL) injection 10 mg  10 mg Intramuscular Q6H PRN Rankin, Shuvon B, NP   10 mg at 02/26/19 0936  . haloperidol (HALDOL) tablet 10 mg  10 mg Oral TID Johnn Hai, MD   10 mg at 02/27/19 1148  . lithium carbonate (ESKALITH) CR tablet 450 mg  450 mg Oral Q12H Johnn Hai, MD   450 mg at 02/27/19 0811  . LORazepam (ATIVAN) injection 4 mg  4 mg Intramuscular Q4H PRN Rankin, Shuvon B, NP   4 mg at 02/26/19 0937  . temazepam (RESTORIL) capsule 45 mg  45 mg Oral QHS Rankin, Shuvon B, NP   45 mg at 02/27/19 0100    Lab Results: No results found for this or any previous visit (from the past 81 hour(s)).  Blood Alcohol level:  Lab Results  Component Value Date   ETH <10 01/26/2019   ETH <10 85/27/7824    Metabolic Disorder Labs: Lab Results  Component Value Date   HGBA1C 5.4 06/05/2016   MPG 108 06/05/2016   MPG 108 05/16/2016   Lab Results  Component Value Date   PROLACTIN 32.7 (H) 02/22/2013   Lab Results  Component Value Date   CHOL 156 06/05/2016   TRIG 73 06/05/2016   HDL 56 06/05/2016   CHOLHDL 2.8 06/05/2016   VLDL 15 06/05/2016   LDLCALC 85 06/05/2016   LDLCALC 105 (H) 05/16/2016    Physical Findings: AIMS:  Facial and Oral Movements Muscles of Facial Expression: None, normal Lips and Perioral Area: None, normal Jaw: None, normal Tongue: None, normal,Extremity Movements Upper (arms, wrists, hands, fingers): None, normal Lower (legs, knees, ankles, toes): None, normal, Trunk Movements Neck, shoulders, hips: None, normal, Overall Severity Severity of abnormal movements (highest score from questions above): None, normal Incapacitation due to abnormal movements: None, normal Patient's awareness of  abnormal movements (rate only patient's report): No Awareness, Dental Status Current problems with teeth and/or dentures?: No Does patient usually wear dentures?: No  CIWA:    COWS:     Musculoskeletal: Strength & Muscle Tone: within normal limits Gait & Station: normal Patient leans: N/A  Psychiatric Specialty Exam: Physical Exam  Constitutional: He appears well-developed.  Psychiatric: He has a normal mood and affect. His behavior is normal.    Review of Systems  All other systems reviewed and are negative.   Blood pressure (!) 92/54, pulse 86, temperature 99 F (37.2 C), temperature source Oral.There is no height or weight on file to calculate BMI.  General Appearance: NA  Eye Contact:  None  Speech:  NA  Volume:  Normal  Mood:  Irritable  Affect:  Non-Congruent  Thought Process:  NA  Orientation:  Other:  Will not answer  Thought Content:  NA  Suicidal Thoughts:  No  Homicidal Thoughts:  N/A  Memory:  Immediate;   Fair Recent;   Fair Remote;   Fair  Judgement:  Impaired  Insight:  Lacking  Psychomotor Activity:  NA patient observed resting in bed  Concentration:  Concentration: Fair and Attention Span: Fair  Recall:  [employer] of Knowledge:  Fair  Language:  Fair  Akathisia:  Negative  Handed:  Right  AIMS (if indicated):     Assets:  Leisure Time Physical Health  ADL's:  Intact  Cognition:  WNL  Sleep:  Number of Hours: 10.25     Treatment Plan Summary: Daily  contact with patient to assess and evaluate symptoms and progress in treatment and Medication management   Continue with current treatment plan on 02/27/2019 as listed below  Continue Haldol 10 mg p.o. 3 times daily Continue lithium 450 mg p.o. twice daily Continue Restoril 45 mg p.o. nightly  See chart for agitation protocol  CSW to continue working on discharge disposition Patient encouraged to participate within the therapeutic milieu  Oneta Rack, NP 02/27/2019, 11:58 AM   Attest to NP Note

## 2019-02-27 NOTE — Progress Notes (Signed)
Adult Psychoeducational Group Note  Date:  02/27/2019 Time:  1:33 AM  Group Topic/Focus:  Wrap-Up Group:   The focus of this group is to help patients review their daily goal of treatment and discuss progress on daily workbooks.  Participation Level:  Did Not Attend  Participation Quality:  Did Not Attend  Affect:  Did Not Attend  Cognitive:  Did Not Attend  Insight: None  Engagement in Group:  Did Not Attend  Modes of Intervention:  Did Not Attend  Additional Comments:  Pt did not attend evening wrap up group tonight.  Candy Sledge 02/27/2019, 1:33 AM

## 2019-02-28 LAB — COMPREHENSIVE METABOLIC PANEL
ALT: 29 U/L (ref 0–44)
AST: 42 U/L — ABNORMAL HIGH (ref 15–41)
Albumin: 4.2 g/dL (ref 3.5–5.0)
Alkaline Phosphatase: 81 U/L (ref 38–126)
Anion gap: 8 (ref 5–15)
BUN: 13 mg/dL (ref 6–20)
CO2: 31 mmol/L (ref 22–32)
Calcium: 9.8 mg/dL (ref 8.9–10.3)
Chloride: 101 mmol/L (ref 98–111)
Creatinine, Ser: 1.04 mg/dL (ref 0.61–1.24)
GFR calc Af Amer: >60 mL/min (ref >60)
GFR calc non Af Amer: >60 mL/min (ref >60)
Glucose, Bld: 70 mg/dL (ref 70–99)
Potassium: 4.1 mmol/L (ref 3.5–5.1)
Sodium: 140 mmol/L (ref 135–145)
Total Bilirubin: 0.4 mg/dL (ref 0.3–1.2)
Total Protein: 7.7 g/dL (ref 6.5–8.1)

## 2019-02-28 LAB — LIPID PANEL
Cholesterol: 178 mg/dL (ref 0–200)
HDL: 60 mg/dL (ref >40)
LDL Cholesterol: 97 mg/dL (ref 0–99)
Total CHOL/HDL Ratio: 3 RATIO
Triglycerides: 104 mg/dL (ref <150)
VLDL: 21 mg/dL (ref 0–40)

## 2019-02-28 LAB — CBC
HCT: 49.7 % (ref 39.0–52.0)
Hemoglobin: 15.7 g/dL (ref 13.0–17.0)
MCH: 27.9 pg (ref 26.0–34.0)
MCHC: 31.6 g/dL (ref 30.0–36.0)
MCV: 88.3 fL (ref 80.0–100.0)
Platelets: 212 10*3/uL (ref 150–400)
RBC: 5.63 MIL/uL (ref 4.22–5.81)
RDW: 13.6 % (ref 11.5–15.5)
WBC: 6 10*3/uL (ref 4.0–10.5)
nRBC: 0 % (ref 0.0–0.2)

## 2019-02-28 LAB — LITHIUM LEVEL: Lithium Lvl: 0.6 mmol/L (ref 0.60–1.20)

## 2019-02-28 LAB — TSH: TSH: 2.275 u[IU]/mL (ref 0.350–4.500)

## 2019-02-28 NOTE — B[facility] Counselor (Signed)
Clinical Social Work Note  CSW called [facility] admissions office to check on status of application.  Labs are still being requested, with doctor specifically requesting CBC and CMP.  If this cannot be obtained due to lack of cooperation from patient, a doctor's note indicating this can be faxed in place of the labs.  When a bed becomes available, this will then be considered.  Selmer Dominion, LCSW 02/28/2019, 9:24 AM

## 2019-02-28 NOTE — [facility] Group Notes (Signed)
[place] LCSW Group Therapy Note  Date/Time:  02/28/2019  11:00AM-12:00PM  Type of Therapy and Topic:  Group Therapy:  Music and Mood  Participation Level:  None   Description of Group: In this process group, members listened to a variety of genres of music and identified that different types of music evoke different responses.  Patients were encouraged to identify music that was soothing for them and music that was energizing for them.  Patients discussed how this knowledge can help with wellness and recovery in various ways including managing depression and anxiety as well as encouraging healthy sleep habits.    Therapeutic Goals: 1. Patients will explore the impact of different varieties of music on mood 2. Patients will verbalize the thoughts they have when listening to different types of music 3. Patients will identify music that is soothing to them as well as music that is energizing to them 4. Patients will discuss how to use this knowledge to assist in maintaining wellness and recovery 5. Patients will explore the use of music as a coping skill  Summary of Patient Progress:  At the beginning of group, patient was not present.  He did arrive in the group at about the halfway point and listened to music while writing in a book.  At the end of group, patient expressed that he felt tired.    Therapeutic Modalities: Solution Focused Brief Therapy Activity   Selmer Dominion, LCSW

## 2019-02-28 NOTE — Progress Notes (Signed)
Specimen cup provided to the pt for an UA. Education provided.

## 2019-02-28 NOTE — Progress Notes (Addendum)
[facility] MD Progress Note  02/28/2019 9:36 AM [name]  MRN:  [id number]  Subjective:  [name] reported " I doing alright."  Evaluation: [name] observed standing at the nurses station.  Was reported patient is taking medications as prescribed.  He denies suicidal or homicidal ideations.  Denies auditory or visual hallucinations.  Patient continues to present guarded however it is engaged during this assessment.  Patient is inquiring about discharge.  Patient to follow-up with social worker for outpatient resources patient declined and said he is already connected with [facility] act services for housing " they have not done anything."  Basic labs i.e. lithium, CBC, CMP, prolactin, lipid and A1c was reordered per RN request.  We will continue to monitor for safety.  Support, encouragement and reassurance was provided.    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 ADD (attention deficit disorder)   . 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 Anemia   . Anxiety   . Bipolar disorder (HCC)   . Borderline personality disorder (HCC)   . Central auditory processing disorder   . Deliberate self-cutting   . Depressed   . Eczema   . Headache(784.0)   . Oppositional defiant disorder   . Schizophrenia [facility])     Past Surgical History:  Procedure Laterality Date  . BACK SURGERY    . FRACTURE SURGERY    . NO PAST SURGERIES    . POSTERIOR LUMBAR FUSION N/A 02/19/2015   Procedure: LATERAL L-2 CORPECTOMY;  Surgeon: Lisbeth Renshaw, MD;  Location: [facility] OR;  Service: Neurosurgery;  Laterality: N/A;  . POSTERIOR LUMBAR FUSION 4 LEVEL  02/19/2015    Procedure: Posterior T-12 - L-4 STABILIZATION OF POSTERIOR LUMBAR;  Surgeon: Lisbeth Renshaw, MD;  Location: [facility] OR;  Service: Neurosurgery;;  . TIBIA IM NAIL INSERTION Left 02/20/2015   Procedure: INTRAMEDULLARY (IM) NAIL LEFT TIBIAL;  Surgeon: [name], MD;  Location: [facility] OR;  Service: Orthopedics;  Laterality: Left;   Family History: History reviewed. No pertinent family history. Family Psychiatric  History: Emelia Loron is an untreated schizophrenic who seems to have filled the patient with a good deal of hostility towards others Social History:  Social History   Substance and Sexual Activity  Alcohol Use Yes  . Alcohol/week: 2.0 standard drinks  . Types: 2 Shots of liquor per week     Social History   Substance and Sexual Activity  Drug Use Yes  . Types: Cocaine, Marijuana   Comment: heroin    Social History   Socioeconomic History  . Marital status: Single    Spouse name: Not on file  . Number of children: Not on file  . Years of education: Not on file  . Highest education level: Not on file  Occupational History  . Occupation: Unemployed  Social Needs  . Financial resource strain: Not on file  . Food insecurity    Worry: Not on file    Inability: Not on file  . Transportation needs    Medical: Not on file    Non-medical: Not on file  Tobacco Use  . Smoking status: Current Every Day Smoker    Packs/day: 2.00    Types: Cigarettes  . Smokeless tobacco: Never Used  . Tobacco comment: 1-3  packs   Substance and Sexual Activity  . Alcohol use: Yes    Alcohol/week: 2.0 standard drinks    Types: 2 Shots of liquor per week  . Drug use: Yes    Types: Cocaine, Marijuana    Comment: heroin  . Sexual activity: Yes    Birth control/protection: None    Comment: pt reluctant to answer questions and frequently stated " I dont know"  Lifestyle  . Physical activity    Days per week: Not on file    Minutes per session: Not on file  . Stress: Not on file   Relationships  . Social Herbalist on phone: Not on file    Gets together: Not on file    Attends religious service: Not on file    Active member of club or organization: Not on file    Attends meetings of clubs or organizations: Not on file    Relationship status: Not on file  Other Topics Concern  . Not on file  Social History Narrative   ** Merged History Encounter **    Pt reported that he is unemployed, homeless as his mother is in rehab   Additional Social History:                         Sleep: Good  Appetite:  Good  Current Medications: Current Facility-Administered Medications  Medication Dose Route Frequency Provider Last Rate Last Dose  . diphenhydrAMINE (BENADRYL) capsule 50 mg  50 mg Oral TID Johnn Hai, MD   50 mg at 02/28/19 0837  . haloperidol (HALDOL) tablet 10 mg  10 mg Oral Q6H PRN Rankin, Shuvon B, NP       Or  . haloperidol lactate (HALDOL) injection 10 mg  10 mg Intramuscular Q6H PRN Rankin, Shuvon B, NP   10 mg at 02/26/19 0936  . haloperidol (HALDOL) tablet 10 mg  10 mg Oral TID Johnn Hai, MD   10 mg at 02/28/19 0837  . lithium carbonate (ESKALITH) CR tablet 450 mg  450 mg Oral Q12H Johnn Hai, MD   450 mg at 02/28/19 0837  . LORazepam (ATIVAN) injection 4 mg  4 mg Intramuscular Q4H PRN Rankin, Shuvon B, NP   4 mg at 02/26/19 0937  . magnesium hydroxide (MILK OF MAGNESIA) suspension 30 mL  30 mL Oral Daily PRN Lindon Romp A, NP      . senna (SENOKOT) tablet 8.6 mg  1 tablet Oral Daily PRN Lindon Romp A, NP   8.6 mg at 02/28/19 0908  . temazepam (RESTORIL) capsule 45 mg  45 mg Oral QHS Rankin, Shuvon B, NP   45 mg at 02/27/19 0100    Lab Results: No results found for this or any previous visit (from the past 73 hour(s)).  Blood Alcohol level:  Lab Results  Component Value Date   ETH <10 01/26/2019   ETH <10 16/02/9603    Metabolic Disorder Labs: Lab Results  Component Value Date   HGBA1C 5.4 06/05/2016   MPG 108  06/05/2016   MPG 108 05/16/2016   Lab Results  Component Value Date   PROLACTIN 32.7 (H) 02/22/2013   Lab Results  Component Value Date   CHOL 156 06/05/2016   TRIG 73 06/05/2016   HDL 56 06/05/2016   CHOLHDL 2.8 06/05/2016   VLDL 15 06/05/2016   LDLCALC 85 06/05/2016   LDLCALC 105 (H) 05/16/2016    Physical Findings: AIMS: Facial and  Oral Movements Muscles of Facial Expression: None, normal Lips and Perioral Area: None, normal Jaw: None, normal Tongue: None, normal,Extremity Movements Upper (arms, wrists, hands, fingers): None, normal Lower (legs, knees, ankles, toes): None, normal, Trunk Movements Neck, shoulders, hips: None, normal, Overall Severity Severity of abnormal movements (highest score from questions above): None, normal Incapacitation due to abnormal movements: None, normal Patient's awareness of abnormal movements (rate only patient's report): No Awareness, Dental Status Current problems with teeth and/or dentures?: No Does patient usually wear dentures?: No  CIWA:    COWS:     Musculoskeletal: Strength & Muscle Tone: within normal limits Gait & Station: normal Patient leans: N/A  Psychiatric Specialty Exam: Physical Exam  Constitutional: He appears well-developed.  Psychiatric: He has a normal mood and affect. His behavior is normal.    Review of Systems  All other systems reviewed and are negative.   Blood pressure (!) 92/54, pulse 86, temperature 99 F (37.2 C), temperature source Oral.There is no height or weight on file to calculate BMI.  General Appearance: Casual and Guarded  Eye Contact:  Fair  Speech:  Clear and Coherent  Volume:  Normal  Mood:  Irritable  Affect:  Non-Congruent  Thought Process:  NA  Orientation:  Full (Time, Place, and Person)  Thought Content:  NA  Suicidal Thoughts:  No  Homicidal Thoughts:  N/A  Memory:  Immediate;   Fair Recent;   Fair Remote;   Fair  Judgement:  Impaired  Insight:  Lacking  Psychomotor  Activity:  Normal  Concentration:  Concentration: Fair and Attention Span: Fair  Recall:  [employer]  Fund of Knowledge:  Fair  Language:  Fair  Akathisia:  Negative  Handed:  Right  AIMS (if indicated):     Assets:  Leisure Time Physical Health  ADL's:  Intact  Cognition:  WNL  Sleep:  Number of Hours: 5.25     Treatment Plan Summary: Daily contact with patient to assess and evaluate symptoms and progress in treatment and Medication management   Continue with current treatment plan on 02/28/2019 as listed below  Continue Haldol 10 mg p.o. 3 times daily Continue lithium 450 mg p.o. twice daily Continue Restoril 45 mg p.o. nightly  See chart for agitation protocol Labs pending results  CSW to continue working on discharge disposition Patient encouraged to participate within the therapeutic milieu  Oneta Rackanika N Lewis, NP 02/28/2019, 9:36 AM  Attest to NP  Note

## 2019-02-28 NOTE — Progress Notes (Signed)
Pt presents with a flat affect and anxious mood. Pt appears brighter on approach, smiles when engaging with peers and staff and is cooperative today. The pt denies AVH.  Pt denies SI/HI. Pt denies depression and anxiety. Pt expressed that he's unable to have an erection because of the medications he's taking. The pt expressed that he was too embarrassed to tell the NP. The pt asked if there's something that can be done to help him with this problem and how can it be detected. Writer notified Ludger Nutting, NP., of the pt's c/o erectile dysfunction. Pt c/o constipation and Senokot administered to the pt at his request. Fluids encouraged and high fiber foods.   Orders reviewed with pt. Verbal support provided. Pt encouraged to attend groups. 15 minute checks performed for safety.   Pt compliant with taking medications and attending groups.

## 2019-02-28 NOTE — Progress Notes (Signed)
Adult Psychoeducational Group Note  Date:  02/28/2019 Time:  2:33 AM  Group Topic/Focus:  Wrap-Up Group:   The focus of this group is to help patients review their daily goal of treatment and discuss progress on daily workbooks.  Participation Level:  Active  Participation Quality:  Appropriate  Affect:  Appropriate  Cognitive:  Appropriate  Insight: Appropriate  Engagement in Group:  Developing/Improving  Modes of Intervention:  Discussion  Additional Comments: Pt stated his goal for today was to focus on his treatment plan. Pt stated he felt he accomplished his goal today. Pt stated his relationship with his family had improved since he was admitted here. Pt stated he felt better about himself today. Pt rated his overall day a 5 out of 10. Pt stated his appetite had improved today. Pt stated his goal for tonight is to get some rest. Pt did not complain of any pain tonight. Pt stated he was not hearing voices or seeing anything that was not there.  Pt stated he had no thoughts of harming himself or others. Pt stated he would alert staff if anything changes   Candy Sledge 02/28/2019, 2:33 AM

## 2019-02-28 NOTE — Progress Notes (Signed)
Adult Psychoeducational Group Note  Date:  02/28/2019 Time:  11:38 PM  Group Topic/Focus:  Wrap-Up Group:   The focus of this group is to help patients review their daily goal of treatment and discuss progress on daily workbooks.  Participation Level:  Did Not Attend  Participation Quality:  Did Not Attend  Affect:  Did Not Attend  Cognitive:  Did Not Attend  Insight: None  Engagement in Group:  Did Not Attend  Modes of Intervention:  Did Not Attend  Additional Comments:  Pt did not attend evening wrap up group tonight.  Candy Sledge 02/28/2019, 11:38 PM

## 2019-02-28 NOTE — Progress Notes (Signed)
Patient has been asleep since shift change. He woke up and requested snacks. Writer introduced self and informed him of medications scheduled. He was compliant with medications other than his Restoril. He did c/o constipation and reported that he has not had a bowel movement since being admitted. Orders received and patient informed. Safety maintained with 15 min checks.

## 2019-03-01 NOTE — Progress Notes (Signed)
[facility] MD Progress Note  03/01/2019 2:59 PM [name]  MRN:  [id number] Subjective:   Patient seen he asks for the interview but then clams up and does not say much he continues to make threats towards examiner and others and then giggle he is passively aggressive during the session, wanting to talk but then not saying anything  Compliance not yet full but we do have force med consult Principal Problem: psychosis/HI Diagnosis: Active Problems:   Schizophrenia (HCC)  Total Time spent with patient: 20 minutes  Past Psychiatric History: see eval  Past Medical History:  Past Medical History:  Diagnosis Date  . ADD (attention deficit disorder)   . 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 Anemia   . Anxiety   . Bipolar disorder (HCC)   . Borderline personality disorder (HCC)   . Central auditory processing disorder   . Deliberate self-cutting   . Depressed   . Eczema   . Headache(784.0)   . Oppositional defiant disorder   . Schizophrenia [facility])     Past Surgical History:  Procedure Laterality Date  . BACK SURGERY    . FRACTURE SURGERY    . NO PAST SURGERIES    . POSTERIOR LUMBAR FUSION N/A 02/19/2015   Procedure: LATERAL L-2 CORPECTOMY;  Surgeon: Lisbeth Renshaw, MD;  Location: [facility] OR;  Service: Neurosurgery;  Laterality: N/A;  . POSTERIOR LUMBAR FUSION 4 LEVEL  02/19/2015   Procedure: Posterior T-12 - L-4 STABILIZATION OF POSTERIOR LUMBAR;  Surgeon: Lisbeth Renshaw, MD;  Location: [facility] OR;  Service: Neurosurgery;;  . TIBIA IM NAIL INSERTION Left 02/20/2015   Procedure: INTRAMEDULLARY (IM) NAIL LEFT TIBIAL;  Surgeon: [name], MD;  Location: [facility] OR;  Service: Orthopedics;  Laterality: Left;   Family History: History reviewed. No pertinent family history. Family Psychiatric  History: see eval Social History:  Social History   Substance and Sexual Activity  Alcohol Use Yes  . Alcohol/week: 2.0 standard drinks  . Types: 2  Shots of liquor per week     Social History   Substance and Sexual Activity  Drug Use Yes  . Types: Cocaine, Marijuana   Comment: heroin    Social History   Socioeconomic History  . Marital status: Single    Spouse name: Not on file  . Number of children: Not on file  . Years of education: Not on file  . Highest education level: Not on file  Occupational History  . Occupation: Unemployed  Social Needs  . Financial resource strain: Not on file  . Food insecurity    Worry: Not on file    Inability: Not on file  . Transportation needs    Medical: Not on file    Non-medical: Not on file  Tobacco Use  . Smoking status: Current Every Day Smoker    Packs/day: 2.00    Types: Cigarettes  . Smokeless tobacco: Never Used  . Tobacco comment: 1-3 packs   Substance and Sexual Activity  . Alcohol use: Yes    Alcohol/week: 2.0 standard drinks    Types: 2 Shots of liquor per week  . Drug use: Yes    Types: Cocaine, Marijuana    Comment: heroin  . Sexual activity: Yes    Birth control/protection: None    Comment: pt reluctant to answer questions and frequently stated " I dont know"  Lifestyle  . Physical activity    Days per week: Not on file    Minutes per session: Not on  file  . Stress: Not on file  Relationships  . Social Musician on phone: Not on file    Gets together: Not on file    Attends religious service: Not on file    Active member of club or organization: Not on file    Attends meetings of clubs or organizations: Not on file    Relationship status: Not on file  Other Topics Concern  . Not on file  Social History Narrative   ** Merged History Encounter **    Pt reported that he is unemployed, homeless as his mother is in rehab   Additional Social History:                         Sleep: Good  Appetite:  Good  Current Medications: Current Facility-Administered Medications  Medication Dose Route Frequency Provider Last Rate Last Dose   . diphenhydrAMINE (BENADRYL) capsule 50 mg  50 mg Oral TID Malvin Johns, MD   50 mg at 03/01/19 1133  . haloperidol (HALDOL) tablet 10 mg  10 mg Oral Q6H PRN Rankin, Shuvon B, NP       Or  . haloperidol lactate (HALDOL) injection 10 mg  10 mg Intramuscular Q6H PRN Rankin, Shuvon B, NP   10 mg at 02/26/19 0936  . haloperidol (HALDOL) tablet 10 mg  10 mg Oral TID Malvin Johns, MD   10 mg at 03/01/19 1133  . lithium carbonate (ESKALITH) CR tablet 450 mg  450 mg Oral Q12H Malvin Johns, MD   450 mg at 03/01/19 0805  . LORazepam (ATIVAN) injection 4 mg  4 mg Intramuscular Q4H PRN Rankin, Shuvon B, NP   4 mg at 02/26/19 0937  . magnesium hydroxide (MILK OF MAGNESIA) suspension 30 mL  30 mL Oral Daily PRN [name] A, NP   30 mL at 02/28/19 1726  . senna (SENOKOT) tablet 8.6 mg  1 tablet Oral Daily PRN [name] A, NP   8.6 mg at 03/01/19 0651  . temazepam (RESTORIL) capsule 45 mg  45 mg Oral QHS Rankin, Shuvon B, NP   45 mg at 02/27/19 0100    Lab Results:  Results for orders placed or performed during the hospital encounter of 02/24/19 (from the past 48 hour(s))  Comprehensive metabolic panel     Status: Abnormal   Collection Time: 02/28/19  6:58 PM  Result Value Ref Range   Sodium 140 135 - 145 mmol/L   Potassium 4.1 3.5 - 5.1 mmol/L   Chloride 101 98 - 111 mmol/L   CO2 31 22 - 32 mmol/L   Glucose, Bld 70 70 - 99 mg/dL   BUN 13 6 - 20 mg/dL   Creatinine, Ser 5.44 0.61 - 1.24 mg/dL   Calcium 9.8 8.9 - 92.0 mg/dL   Total Protein 7.7 6.5 - 8.1 g/dL   Albumin 4.2 3.5 - 5.0 g/dL   AST 42 (H) 15 - 41 U/L   ALT 29 0 - 44 U/L   Alkaline Phosphatase 81 38 - 126 U/L   Total Bilirubin 0.4 0.3 - 1.2 mg/dL   GFR calc non Af Amer >60 >60 mL/min   GFR calc Af Amer >60 >60 mL/min   Anion gap 8 5 - 15    Comment: Performed at [facility], [address] [place], [place] [address]  CBC     Status: None   Collection Time: 02/28/19  6:58 PM  Result  Value Ref Range   WBC 6.0  4.0 - 10.5 K/uL   RBC 5.63 4.22 - 5.81 MIL/uL   Hemoglobin 15.7 13.0 - 17.0 g/dL   HCT 16.149.7 09.639.0 - 04.552.0 %   MCV 88.3 80.0 - 100.0 fL   MCH 27.9 26.0 - 34.0 pg   MCHC 31.6 30.0 - 36.0 g/dL   RDW 40.913.6 81.111.5 - 91.415.5 %   Platelets 212 150 - 400 K/uL   nRBC 0.0 0.0 - 0.2 %    Comment: Performed at [facility], [address] [place], [place] [address]  TSH     Status: None   Collection Time: 02/28/19  6:58 PM  Result Value Ref Range   TSH 2.275 0.350 - 4.500 uIU/mL    Comment: Performed by a 3rd Generation assay with a functional sensitivity of <=0.01 uIU/mL. Performed at [facility], [address] [place], [place] [address]   Lipid panel     Status: None   Collection Time: 02/28/19  6:58 PM  Result Value Ref Range   Cholesterol 178 0 - 200 mg/dL   Triglycerides 865104 <784<150 mg/dL   HDL 60 >69>40 mg/dL   Total CHOL/HDL Ratio 3.0 RATIO   VLDL 21 0 - 40 mg/dL   LDL Cholesterol 97 0 - 99 mg/dL    Comment:        Total Cholesterol/HDL:CHD Risk Coronary Heart Disease Risk Table                     Men   Women  1/2 Average Risk   3.4   3.3  Average Risk       5.0   4.4  2 X Average Risk   9.6   7.1  3 X Average Risk  23.4   11.0        Use the calculated Patient Ratio above and the CHD Risk Table to determine the patient's CHD Risk.        ATP III CLASSIFICATION (LDL):  <100     mg/dL   Optimal  [id number]  mg/dL   Near or Above                    Optimal  130-159  mg/dL   Borderline  [phone]  mg/dL   High  >010>190     mg/dL   Very High Performed at [facility], [address] [place], [place] [address]   Lithium level     Status: None   Collection Time: 02/28/19  6:58 PM  Result Value Ref Range   Lithium Lvl 0.60 0.60 - 1.20 mmol/L    Comment: Performed at [facility], [address] [place], [place] [address]    Blood Alcohol level:  Lab Results  Component Value Date   ETH <10 01/26/2019   ETH <10  12/30/2018    Metabolic Disorder Labs: Lab Results  Component Value Date   HGBA1C 5.4 06/05/2016   MPG 108 06/05/2016   MPG 108 05/16/2016   Lab Results  Component Value Date   PROLACTIN 32.7 (H) 02/22/2013   Lab Results  Component Value Date   CHOL 178 02/28/2019   TRIG 104 02/28/2019   HDL 60 02/28/2019   CHOLHDL 3.0 02/28/2019   VLDL 21 02/28/2019   LDLCALC 97 02/28/2019   LDLCALC 85 06/05/2016    Physical Findings: AIMS: Facial and Oral Movements Muscles of Facial Expression: None, normal Lips  and Perioral Area: None, normal Jaw: None, normal Tongue: None, normal,Extremity Movements Upper (arms, wrists, hands, fingers): None, normal Lower (legs, knees, ankles, toes): None, normal, Trunk Movements Neck, shoulders, hips: None, normal, Overall Severity Severity of abnormal movements (highest score from questions above): None, normal Incapacitation due to abnormal movements: None, normal Patient's awareness of abnormal movements (rate only patient's report): No Awareness, Dental Status Current problems with teeth and/or dentures?: No Does patient usually wear dentures?: No  CIWA:    COWS:     Musculoskeletal: Strength & Muscle Tone: within normal limits Gait & Station: normal Patient leans: N/A  Psychiatric Specialty Exam: Physical Exam  ROS  Blood pressure 102/60, pulse (!) 47, temperature 97.8 F (36.6 C), temperature source Oral.There is no height or weight on file to calculate BMI.  General Appearance: Casual  Eye Contact:  None  Speech:  Slow  Volume:  Decreased  Mood:  Irritable  Affect:  Non-Congruent  Thought Process:  Descriptions of Associations: Loose  Orientation:  Other:  will not answer  Thought Content:  Illogical and Delusions  Suicidal Thoughts:  No  Homicidal Thoughts:  Yes.  with intent/plan  Memory:  Immediate;   Fair Recent;   Poor Remote;   Poor  Judgement:  Impaired  Insight:  Shallow  Psychomotor Activity:  Normal   Concentration:  Concentration: Poor and Attention Span: Poor  Recall:  Poor  Fund of Knowledge:  Poor  Language:  Poor  Akathisia:  Negative  Handed:  Right  AIMS (if indicated):     Assets:  Communication Skills Resilience  ADL's:  Intact  Cognition:  WNL  Sleep:  Number of Hours: 6.75     Treatment Plan Summary: Daily contact with patient to assess and evaluate symptoms and progress in treatment and Medication management  Continue antipsychotic therapy/long-acting injectable/lithium therapy continue to monitor for safety no change in precautions  Keyondra Lagrand, MD 03/01/2019, 2:59 PM

## 2019-03-01 NOTE — Progress Notes (Signed)
Psychoeducational Group Note  Date:  03/01/2019 Time:  2041  Group Topic/Focus:  Wrap-Up Group:   The focus of this group is to help patients review their daily goal of treatment and discuss progress on daily workbooks.  Participation Level: Did Not Attend  Participation Quality:  Not Applicable  Affect:  Not Applicable  Cognitive:  Not Applicable  Insight:  Not Applicable  Engagement in Group: Not Applicable  Additional Comments:  The patient did not attend group .   [name] S 03/01/2019, 8:41 PM

## 2019-03-01 NOTE — Progress Notes (Signed)
Recreation Therapy Notes  Date: 10.19.20 Time: 1000 Location: [address] Dayroom  Group Topic: Coping Skills  Goal Area(s) Addresses:  Patient will identify positive coping skills. Patient will identify benefit of using coping skills post d/c.  Intervention: White board, dry erase marker, worksheet, pencils  Activity: Mind Map.  Patients were given a blank mind map.  LRT and patients filled in the first 8 boxes together (arguments, road rage, solicitors, family, work, finances, weather and relationships). Patients were given time to come up with at least 3 coping skills for the areas identified.  LRT would then write the coping skills on the board so patients could fill in any blank spaces they may have had on their mind map.    Education: Coping Skills, Discharge Planning.   Education Outcome: Acknowledges understanding/In group clarification offered/Needs additional education.   Clinical Observations/Feedback: Pt did not attend group.     Toyia Jelinek, LRT/CTRS         Grey Rakestraw A 03/01/2019 11:43 AM 

## 2019-03-01 NOTE — Progress Notes (Signed)
Patient ID: [name], male   DOB: December 11, 1995, 23 y.o.   MRN: [id number]   [place] faxed all completed blood work (UDS, CBC, and CMP) to [facility] will follow up by phone in a few hours.   Ardelle Anton, MSW, [facility] Phone: [phone] Fax: [phone]

## 2019-03-01 NOTE — [facility] Group Notes (Signed)
[place] LCSW Group Therapy Note   Date and Time: 03/01/2019 @ 1:30pm  Type of Therapy and Topic:  Group Therapy:  Trust and Honesty    Participation Level:  [facility] PARTICIPATION LEVEL: Did Not Attend   Mood: Did not attend     Description of Group:     In this group patients will be asked to explore the value of being honest.  Patients will be guided to discuss their thoughts, feelings, and behaviors related to honesty and trusting in others. Patients will process together how trust and honesty relate to forming relationships with peers, family members, and self. Each patient will be challenged to identify and express feelings of being vulnerable. Patients will discuss reasons why people are dishonest and identify alternative outcomes if one was truthful (to self or others). This group will be process-oriented, with patients participating in exploration of their own experiences, giving and receiving support, and processing challenge from other group members.     Therapeutic Goals:  1.  Patient will identify why honesty is important to relationships and how honesty overall affects relationships.  2.  Patient will identify a situation where they lied or were lied too and the  feelings, thought process, and behaviors surrounding the situation  3.  Patient will identify the meaning of being vulnerable, how that feels, and how that correlates to being honest with self and others.  4.  Patient will identify situations where they could have told the truth, but instead lied and explain reasons of dishonesty.     Summary of Patient Progress:       Patient did not attend group today.        Therapeutic Modalities:    Cognitive Behavioral Therapy  Solution Focused Therapy  Motivational Interviewing  Brief Therapy   Ardelle Anton, MSW, LCSW

## 2019-03-01 NOTE — Progress Notes (Signed)
D: Pt out his room took his medications and got a snack and went back to his room. Pt stated he felt the same as when he came in here. Pt presented calmer than last week and not as much inappropriate smiling and not trying to verbally intimidate this evening.   A: Pt was offered support and encouragement. Pt was given scheduled medications. Pt was encourage to attend groups. Q 15 minute checks were done for safety.   R: safety maintained on unit.

## 2019-03-01 NOTE — Progress Notes (Signed)
Patient ID: [name], male   DOB: [birth year], 24 y.o.   MRN: [id number]   CSW called [facility] at [phone] to obtain an update. [facility] received the fax with all the completed blood work and vitals. [place] nursed sent those over to medical. [facility] nurse stated that she will contact CSW once she gets word from [place], MSW, [facility] Phone: [phone] Fax: [phone]

## 2019-03-02 LAB — PROLACTIN: Prolactin: 37.6 ng/mL — ABNORMAL HIGH (ref 4.0–15.2)

## 2019-03-02 LAB — HEMOGLOBIN A1C
Hgb A1c MFr Bld: 5.4 % (ref 4.8–5.6)
Mean Plasma Glucose: 108 mg/dL
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
## 2019-03-02 NOTE — Progress Notes (Signed)
Recreation Therapy Notes  Date: 10.20.20 Time: 0955 Location: [address] Dayroom  Group Topic: Triggers  Goal Area(s) Addresses:  Patient will identify triggers. Patient will identify how to avoid triggers. Patient will identify how to deal with triggers head on.  Intervention: Worksheet, music  Activity: Triggers.  Patients were to identify their biggest triggers.  Patients would then identify ways to avoid/reduce exposure to triggers and how to face triggers head on.  Education: Triggers, Discharge Planning  Education Outcome: Acknowledges understanding/In group clarification offered/Needs additional education.   Clinical Observations/Feedback: Pt did not attend group session.    Taylor Spilde, LRT/CTRS         Zarriah Starkel A 03/02/2019 11:45 AM 

## 2019-03-02 NOTE — Progress Notes (Signed)
Patient ID: [name], male   DOB: 1996-02-09, 23 y.o.   MRN: [id number]  CSW received a voicemail from [facility] stating that the patient is officially on the waitlist.

## 2019-03-02 NOTE — Progress Notes (Signed)
Psychoeducational Group Note  Date:  03/02/2019 Time:  2046  Group Topic/Focus:  Wrap-Up Group:   The focus of this group is to help patients review their daily goal of treatment and discuss progress on daily workbooks.  Participation Level: Did Not Attend  Participation Quality:  Not Applicable  Affect:  Not Applicable  Cognitive:  Not Applicable  Insight:  Not Applicable  Engagement in Group: Not Applicable  Additional Comments:  The patient did not attend group this evening.   [name] S 03/02/2019, 8:46 PM

## 2019-03-03 NOTE — Progress Notes (Signed)
Psychoeducational Group Note  Date:  03/03/2019 Time:  2030  Group Topic/Focus:  Wrap-Up Group:   The focus of this group is to help patients review their daily goal of treatment and discuss progress on daily workbooks.  Participation Level: Did Not Attend  Participation Quality:  Not Applicable  Affect:  Not Applicable  Cognitive:  Not Applicable  Insight:  Not Applicable  Engagement in Group: Not Applicable  Additional Comments:  The patient did not attend group since he was asleep in his bedroom.   Aleisha Paone S 03/03/2019, 8:30 PM

## 2019-03-03 NOTE — Tx Team (Signed)
Interdisciplinary Treatment and Diagnostic Plan Update  03/03/2019 Time of Session: 1:25pm [name] MRN: [id number]  Principal Diagnosis: <principal problem not specified>  Secondary Diagnoses: Active Problems:   Schizophrenia ([place])   Current Medications:  Current Facility-Administered Medications  Medication Dose Route Frequency Provider Last Rate Last Dose  . diphenhydrAMINE (BENADRYL) capsule 50 mg  50 mg Oral TID Johnn Hai, MD   50 mg at 03/03/19 1011  . haloperidol (HALDOL) tablet 10 mg  10 mg Oral Q6H PRN Rankin, Shuvon B, NP       Or  . haloperidol lactate (HALDOL) injection 10 mg  10 mg Intramuscular Q6H PRN Rankin, Shuvon B, NP   10 mg at 02/26/19 0936  . haloperidol (HALDOL) tablet 10 mg  10 mg Oral TID Johnn Hai, MD   10 mg at 03/03/19 1010  . lithium carbonate (ESKALITH) CR tablet 450 mg  450 mg Oral Q12H Johnn Hai, MD   450 mg at 03/03/19 1011  . LORazepam (ATIVAN) injection 4 mg  4 mg Intramuscular Q4H PRN Rankin, Shuvon B, NP   4 mg at 02/26/19 0937  . magnesium hydroxide (MILK OF MAGNESIA) suspension 30 mL  30 mL Oral Daily PRN Lindon Romp A, NP   30 mL at 02/28/19 1726  . senna (SENOKOT) tablet 8.6 mg  1 tablet Oral Daily PRN Lindon Romp A, NP   8.6 mg at 03/01/19 0651  . temazepam (RESTORIL) capsule 45 mg  45 mg Oral QHS Rankin, Shuvon B, NP   45 mg at 03/01/19 2031   PTA Medications: Medications Prior to Admission  Medication Sig Dispense Refill Last Dose  . ARIPiprazole (ABILIFY IM) Inject 400 mg into the muscle every 30 (thirty) days.        Patient Stressors:    Patient Strengths:    Treatment Modalities: Medication Management, Group therapy, Case management,  1 to 1 session with clinician, Psychoeducation, Recreational therapy.   Physician Treatment Plan for Primary Diagnosis: <principal problem not specified> Long Term Goal(s): Improvement in symptoms so as ready for discharge Improvement in symptoms so as ready for  discharge   Short Term Goals: Ability to disclose and discuss suicidal ideas Ability to identify and develop effective coping behaviors will improve Ability to maintain clinical measurements within normal limits will improve Compliance with prescribed medications will improve Ability to identify and develop effective coping behaviors will improve Ability to maintain clinical measurements within normal limits will improve Compliance with prescribed medications will improve  Medication Management: Evaluate patient's response, side effects, and tolerance of medication regimen.  Therapeutic Interventions: 1 to 1 sessions, Unit Group sessions and Medication administration.  Evaluation of Outcomes: Not Progressing  Physician Treatment Plan for Secondary Diagnosis: Active Problems:   Schizophrenia ([place])  Long Term Goal(s): Improvement in symptoms so as ready for discharge Improvement in symptoms so as ready for discharge   Short Term Goals: Ability to disclose and discuss suicidal ideas Ability to identify and develop effective coping behaviors will improve Ability to maintain clinical measurements within normal limits will improve Compliance with prescribed medications will improve Ability to identify and develop effective coping behaviors will improve Ability to maintain clinical measurements within normal limits will improve Compliance with prescribed medications will improve     Medication Management: Evaluate patient's response, side effects, and tolerance of medication regimen.  Therapeutic Interventions: 1 to 1 sessions, Unit Group sessions and Medication administration.  Evaluation of Outcomes: Not Progressing   RN Treatment Plan for Primary Diagnosis: <principal  problem not specified> Long Term Goal(s): Knowledge of disease and therapeutic regimen to maintain health will improve  Short Term Goals: Ability to participate in decision making will improve, Ability to verbalize  feelings will improve, Ability to disclose and discuss suicidal ideas, Ability to identify and develop effective coping behaviors will improve and Compliance with prescribed medications will improve  Medication Management: RN will administer medications as ordered by provider, will assess and evaluate patient's response and provide education to patient for prescribed medication. RN will report any adverse and/or side effects to prescribing provider.  Therapeutic Interventions: 1 on 1 counseling sessions, Psychoeducation, Medication administration, Evaluate responses to treatment, Monitor vital signs and CBGs as ordered, Perform/monitor CIWA, COWS, AIMS and Fall Risk screenings as ordered, Perform wound care treatments as ordered.  Evaluation of Outcomes: Not Progressing   LCSW Treatment Plan for Primary Diagnosis: <principal problem not specified> Long Term Goal(s): Safe transition to appropriate next level of care at discharge, Engage patient in therapeutic group addressing interpersonal concerns.  Short Term Goals: Engage patient in aftercare planning with referrals and resources and Increase skills for wellness and recovery  Therapeutic Interventions: Assess for all discharge needs, 1 to 1 time with Social worker, Explore available resources and support systems, Assess for adequacy in community support network, Educate family and significant other(s) on suicide prevention, Complete Psychosocial Assessment, Interpersonal group therapy.  Evaluation of Outcomes: Not Progressing   Progress in Treatment: Attending groups: No. Participating in groups: No. Taking medication as prescribed: Yes. Toleration medication: Yes. Family/Significant other contact made: Yes, individual(s) contacted:  pt's mother/legal guardian Patient understands diagnosis: No. Discussing patient identified problems/goals with staff: Yes. Medical problems stabilized or resolved: Yes. Denies suicidal/homicidal ideation:  No.;HI Issues/concerns per patient self-inventory: No. Other:   New problem(s) identified: No, Describe:  None  New Short Term/Long Term Goal(s): Medication stabilization, elimination of SI thoughts, and development of a comprehensive mental wellness plan.   Patient Goals:  Patient did not attend treatment team.  Discharge Plan or Barriers: Patient is currently on the [facility]list for [facility].   Reason for Continuation of Hospitalization: Aggression Homicidal ideation Medication stabilization  Estimated Length of Stay: 5-7 days   Attendees: Patient:  03/03/2019   Physician: Dr. Malvin Johns, MD 03/03/2019   Nursing: Marton Redwood, RN 03/03/2019   RN Care Manager: 03/03/2019   Social Worker: Stephannie Peters, LCSW  03/03/2019   Recreational Therapist:  03/03/2019   MSW Intern: Earlyne Iba 03/03/2019   Other:  03/03/2019   Other: 03/03/2019      Scribe for Treatment Team: Delphia Grates, LCSW 03/03/2019 1:50 PM
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
## 2019-03-03 NOTE — Progress Notes (Signed)
Recreation Therapy Notes  Date: 10.21.20 Time: 1000 Location: 500 Hall Dayroom   Group Topic: Communication, Team Building, Problem Solving  Goal Area(s) Addresses:  Patient will effectively work with peer towards shared goal.  Patient will identify skills used to make activity successful.  Patient will identify how skills used during activity can be used to reach post d/c goals.   Intervention: STEM Activity  Activity:  Cup [employer].  In groups of 4, patients were given 10 red cups and rubber band with four strings attached to it.  Patients were to use the rubber band contraption to sit the cups upright and then stack them like a pyramid.  Education: Education officer, community, Discharge Planning   Education Outcome: Acknowledges education/In group clarification offered/Needs additional education.   Clinical Observations/Feedback:  Pt did not attend group.     Victorino Sparrow, LRT/CTRS     Ria Comment, Gwenda Heiner A 03/03/2019 12:12 PM
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
## 2019-03-04 NOTE — Progress Notes (Signed)
Psychoeducational Group Note  Date:  03/04/2019 Time:  2040  Group Topic/Focus:  Wrap-Up Group:   The focus of this group is to help patients review their daily goal of treatment and discuss progress on daily workbooks.  Participation Level: Did Not Attend  Participation Quality:  Not Applicable  Affect:  Not Applicable  Cognitive:  Not Applicable  Insight:  Not Applicable  Engagement in Group: Not Applicable  Additional Comments:  The patient did not attend group this evening.   [name] S 03/04/2019, 8:40 PM

## 2019-03-04 NOTE — Progress Notes (Signed)
D: Pt very lethargic this evening, night medications held. Pt been in room all evening A: Pt was offered support and encouragement. Pt was encourage to attend groups. Q 15 minute checks were done for safety.  R: safety maintained on unit.

## 2019-03-04 NOTE — Progress Notes (Signed)
D: Pt kept to self sleep in room much of the evening.  A: Pt was offered support and encouragement. Pt was encourage to attend groups. Q 15 minute checks were done for safety.  R: safety maintained on unit.

## 2019-03-05 MED ORDER — HALOPERIDOL DECANOATE 100 MG/ML IM SOLN: 50.0000 mg | Freq: Once | INTRAMUSCULAR | Status: DC

## 2019-03-05 MED ORDER — HALOPERIDOL DECANOATE 100 MG/ML IM SOLN
25.0000 mg | Freq: Once | INTRAMUSCULAR | Status: AC
  Administered 2019-03-05: 25 mg via INTRAMUSCULAR
  Filled 2019-03-05: qty 0.25

## 2019-03-05 NOTE — Progress Notes (Signed)
Adult Psychoeducational Group Note  Date:  03/05/2019 Time:  8:59 PM  Group Topic/Focus:  Wrap-Up Group:   The focus of this group is to help patients review their daily goal of treatment and discuss progress on daily workbooks.  Participation Level:  Did Not Attend  Participation Quality:  Did Not Attend  Affect:  Did Not Attend  Cognitive:  Did Not Attend  Insight: None  Engagement in Group:  Did Not Attend  Modes of Intervention:  Did Not Attend  Additional Comments:  Pt did not attend evening wrap up group tonight.  Candy Sledge 03/05/2019, 8:59 PM

## 2019-03-05 NOTE — Progress Notes (Signed)
   03/05/19 2300  Psych Admission Type (Psych Patients Only)  Admission Status Involuntary  Psychosocial Assessment  Patient Complaints Anxiety;Suspiciousness  Eye Contact Fair  Facial Expression Anxious  Affect Appropriate to circumstance  Speech Logical/coherent  Interaction Cautious  Motor Activity Fidgety  Appearance/Hygiene Improved  Behavior Characteristics Anxious  Mood Suspicious  Thought Process  Coherency Concrete thinking  Content Preoccupation  Delusions None reported or observed  Perception WDL  Hallucination None reported or observed  Judgment Poor  Confusion Mild  Danger to Self  Current suicidal ideation? Denies  Self-Injurious Behavior No self-injurious ideation or behavior indicators observed or expressed   Danger to Others  Danger to Others None reported or observed  Danger to Others Abnormal  Harmful Behavior to others No threats or harm toward other people  Destructive Behavior No threats or harm toward property   D: Pt given 25 mg Haldol Decanoate per MAR. Pt refused to take his medications originally, pt encouraged to get up and take his medications and he complied  A: Pt was offered support and encouragement. Pt was given scheduled medications. Pt was encourage to attend groups. Q 15 minute checks were done for safety.   R: safety maintained on unit.

## 2019-03-05 NOTE — Progress Notes (Signed)
Patient ID: [name], male   DOB: March 06, 1996, 23 y.o.   MRN: [id number]   CSW met with patient. Patient stated that the doctor told him that he can be taken off of the waitlist for [facility] is not aware of this. CSW discussed talking with the doctor about a discharge plan if he is not going to [facility]. Patient stated that he would go stay at a shelter.  CSW will continue to assess the patient and look for discharge resources with the help of the pt's doctor.

## 2019-03-05 NOTE — Progress Notes (Signed)
Recreation Therapy Notes  Date: 10.23.20 Time: 1000 Location: 500 Hall  Group Topic: Team Building  Goal Area(s) Addresses:  Patient will effectively work with group towards shared goal. Patient will identify skills needed to complete activity. Patient will verbalize how skills can be used post d/c.  Intervention: Group Activity  Activity:  [employer] in [employer].  Each patient was given are rubber disc and as a whole the group was given a disc.  Patients were to work together using the disc to maneuver the group from one end of the hall to the other and back.  If anyone stepped off their disc the group would start over.  Education: Team Building, Discharge Planning  Education Outcome: Acknowledges understanding/In group clarification offered/Needs additional education.   Clinical Observations/Feedback: Pt did not attend in group.    Victorino Sparrow, LRT/CTRS    Ria Comment, Alexandrea Westergard A 03/05/2019 11:09 AM

## 2019-03-05 NOTE — Progress Notes (Signed)
Patient ID: [name], male   DOB: [birth year], 23 y.o.   MRN: [id number]   [place] faxed over additional documents (doctor notes) to [facility] (attention: Gwyndolyn Saxon, RN) with updated documentation on the patient.    Ardelle Anton, MSW, [facility] Phone: [phone] Fax: [phone]
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
## 2019-03-06 NOTE — [facility] Group Notes (Signed)
  [facility] LCSW Group Therapy Note  Date/Time:  03/06/2019 11:15AM-12:00PM  Type of Therapy and Topic:  Group Therapy:  Feelings About Hospitalization  Participation Level:  Minimal   Description of Group This process group involved patients discussing their feelings related to being hospitalized, as well as the benefits they see to being in the hospital.  These feelings and benefits were itemized.  The group then brainstormed specific ways in which they could seek those same benefits when they discharge and return home.  Therapeutic Goals 1. Patient will identify and describe positive and negative feelings related to hospitalization 2. Patient will verbalize benefits of hospitalization to themselves personally 3. Patients will brainstorm together ways they can obtain similar benefits in the outpatient setting, identify barriers to wellness and possible solutions  Summary of Patient Progress:  The patient expressed his primary feelings about being hospitalized are that he would like to be outside and cannot.  When asked what he will do to stay healthy, he said he did not know, but was able to agree with some of the things mentioned by CSW.  Therapeutic Modalities Cognitive Behavioral Therapy Motivational Interviewing    Selmer Dominion, LCSW 03/06/2019, 3:35 PM
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
## 2019-03-07 NOTE — Progress Notes (Signed)
Patient has been lying in bed asleep since shift change. No distress noted and patient is safe.

## 2019-03-07 NOTE — [facility] Group Notes (Signed)
[facility] LCSW Group Therapy Note  Date/Time:  03/07/2019  11:00AM-12:00PM  Type of Therapy and Topic:  Group Therapy:  Music and Mood  Participation Level:  Minimal   Description of Group: In this process group, members listened to a variety of genres of music and identified that different types of music evoke different responses.  Patients were encouraged to identify music that was soothing for them and music that was energizing for them.  Patients discussed how this knowledge can help with wellness and recovery in various ways including managing depression and anxiety as well as encouraging healthy sleep habits.    Therapeutic Goals: 1. Patients will explore the impact of different varieties of music on mood 2. Patients will verbalize the thoughts they have when listening to different types of music 3. Patients will identify music that is soothing to them as well as music that is energizing to them 4. Patients will discuss how to use this knowledge to assist in maintaining wellness and recovery 5. Patients will explore the use of music as a coping skill  Summary of Patient Progress:  At the beginning of group, patient expressed that he felt "okay."  At the end of group, patient expressed that he still felt okay.  When asked how songs impacted his feelings, each time he responded that it didn't.    Therapeutic Modalities: Solution Focused Brief Therapy Activity   Selmer Dominion, LCSW

## 2019-03-07 NOTE — Progress Notes (Signed)
[facility] MD Progress Note  03/07/2019 11:12 AM [name]  MRN:  [id number] Subjective:  Patient is a 23 year old male admitted on 02/24/2019 secondary to involuntary commitment due to homicidal ideation.  The patient has a known history of schizophrenia and noncompliance with his psychiatric medications in addition to chronic substance abuse.  Objective: Patient is seen and examined.  Patient is a 23 year old male with a past psychiatric history significant for schizophrenia who is seen in follow-up.  Review of nursing notes overnight showed that the patient had no complaints last evening.  His vital signs are stable, he is afebrile.  He slept 6.75 hours last night.  No new laboratories.  He denied complaint today.  He remains pensive and guarded.  He denied auditory and visual complaints.  He denied any suicidal ideation.  Principal Problem: <principal problem not specified> Diagnosis: Active Problems:   Schizophrenia (HCC)  Total Time spent with patient: 20 minutes  Past Psychiatric History: See admission H&P  Past Medical History:  Past Medical History:  Diagnosis Date  . ADD (attention deficit disorder)   . 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 Anemia   . Anxiety   . Bipolar disorder (HCC)   . Borderline personality disorder (HCC)   . Central auditory processing disorder   . Deliberate self-cutting   . Depressed   . Eczema   . Headache(784.0)   . Oppositional defiant disorder   . Schizophrenia [facility])     Past Surgical History:  Procedure Laterality Date  . BACK SURGERY    . FRACTURE SURGERY    . NO PAST SURGERIES    . POSTERIOR LUMBAR FUSION N/A 02/19/2015   Procedure: LATERAL L-2 CORPECTOMY;  Surgeon: Lisbeth Renshaw, MD;  Location: [facility] OR;  Service: Neurosurgery;  Laterality: N/A;  . POSTERIOR LUMBAR FUSION 4 LEVEL  02/19/2015   Procedure: Posterior T-12 - L-4 STABILIZATION OF POSTERIOR LUMBAR;  Surgeon: Lisbeth Renshaw,  MD;  Location: [facility] OR;  Service: Neurosurgery;;  . TIBIA IM NAIL INSERTION Left 02/20/2015   Procedure: INTRAMEDULLARY (IM) NAIL LEFT TIBIAL;  Surgeon: [name], MD;  Location: [facility] OR;  Service: Orthopedics;  Laterality: Left;   Family History: History reviewed. No pertinent family history. Family Psychiatric  History: See admission H&P Social History:  Social History   Substance and Sexual Activity  Alcohol Use Yes  . Alcohol/week: 2.0 standard drinks  . Types: 2 Shots of liquor per week     Social History   Substance and Sexual Activity  Drug Use Yes  . Types: Cocaine, Marijuana   Comment: heroin    Social History   Socioeconomic History  . Marital status: Single    Spouse name: Not on file  . Number of children: Not on file  . Years of education: Not on file  . Highest education level: Not on file  Occupational History  . Occupation: Unemployed  Social Needs  . Financial resource strain: Not on file  . Food insecurity    Worry: Not on file    Inability: Not on file  . Transportation needs    Medical: Not on file    Non-medical: Not on file  Tobacco Use  . Smoking status: Current Every Day Smoker    Packs/day: 2.00    Types: Cigarettes  . Smokeless tobacco: Never Used  . Tobacco comment: 1-3 packs   Substance and Sexual Activity  . Alcohol use: Yes    Alcohol/week: 2.0 standard drinks    Types:  2 Shots of liquor per week  . Drug use: Yes    Types: Cocaine, Marijuana    Comment: heroin  . Sexual activity: Yes    Birth control/protection: None    Comment: pt reluctant to answer questions and frequently stated " I dont know"  Lifestyle  . Physical activity    Days per week: Not on file    Minutes per session: Not on file  . Stress: Not on file  Relationships  . Social Musicianconnections    Talks on phone: Not on file    Gets together: Not on file    Attends religious service: Not on file    Active member of club or organization: Not on file    Attends  meetings of clubs or organizations: Not on file    Relationship status: Not on file  Other Topics Concern  . Not on file  Social History Narrative   ** Merged History Encounter **    Pt reported that he is unemployed, homeless as his mother is in rehab   Additional Social History:                         Sleep: Good  Appetite:  Fair  Current Medications: Current Facility-Administered Medications  Medication Dose Route Frequency Provider Last Rate Last Dose  . diphenhydrAMINE (BENADRYL) capsule 50 mg  50 mg Oral TID Malvin JohnsFarah, Brian, MD   50 mg at 03/07/19 [id number]  . haloperidol (HALDOL) tablet 10 mg  10 mg Oral Q6H PRN Rankin, Shuvon B, NP       Or  . haloperidol lactate (HALDOL) injection 10 mg  10 mg Intramuscular Q6H PRN Rankin, Shuvon B, NP   10 mg at 02/26/19 0936  . haloperidol (HALDOL) tablet 10 mg  10 mg Oral TID Malvin JohnsFarah, Brian, MD   10 mg at 03/07/19 [id number]  . lithium carbonate (ESKALITH) CR tablet 450 mg  450 mg Oral Q12H Malvin JohnsFarah, Brian, MD   450 mg at 03/07/19 [id number]  . LORazepam (ATIVAN) injection 4 mg  4 mg Intramuscular Q4H PRN Rankin, Shuvon B, NP   4 mg at 02/26/19 0937  . magnesium hydroxide (MILK OF MAGNESIA) suspension 30 mL  30 mL Oral Daily PRN Nira ConnBerry, Jason A, NP   30 mL at 02/28/19 1726  . senna (SENOKOT) tablet 8.6 mg  1 tablet Oral Daily PRN Nira ConnBerry, Jason A, NP   8.6 mg at 03/01/19 0651  . temazepam (RESTORIL) capsule 45 mg  45 mg Oral QHS Rankin, Shuvon B, NP   45 mg at 03/01/19 2031    Lab Results: No results found for this or any previous visit (from the past 48 hour(s)).  Blood Alcohol level:  Lab Results  Component Value Date   ETH <10 01/26/2019   ETH <10 12/30/2018    Metabolic Disorder Labs: Lab Results  Component Value Date   HGBA1C 5.4 02/28/2019   MPG 108 02/28/2019   MPG 108 06/05/2016   Lab Results  Component Value Date   PROLACTIN 37.6 (H) 02/28/2019   PROLACTIN 32.7 (H) 02/22/2013   Lab Results  Component Value Date   CHOL 178  02/28/2019   TRIG 104 02/28/2019   HDL 60 02/28/2019   CHOLHDL 3.0 02/28/2019   VLDL 21 02/28/2019   LDLCALC 97 02/28/2019   LDLCALC 85 06/05/2016    Physical Findings: AIMS: Facial and Oral Movements Muscles of Facial Expression: None, normal Lips and Perioral Area: None, normal Jaw:  None, normal Tongue: None, normal,Extremity Movements Upper (arms, wrists, hands, fingers): None, normal Lower (legs, knees, ankles, toes): None, normal, Trunk Movements Neck, shoulders, hips: None, normal, Overall Severity Severity of abnormal movements (highest score from questions above): None, normal Incapacitation due to abnormal movements: None, normal Patient's awareness of abnormal movements (rate only patient's report): No Awareness, Dental Status Current problems with teeth and/or dentures?: No Does patient usually wear dentures?: No  CIWA:    COWS:     Musculoskeletal: Strength & Muscle Tone: within normal limits Gait & Station: normal Patient leans: N/A  Psychiatric Specialty Exam: Physical Exam  Nursing note and vitals reviewed. Constitutional: He is oriented to person, place, and time. He appears well-developed and well-nourished.  HENT:  Head: Normocephalic and atraumatic.  Respiratory: Effort normal and breath sounds normal.  Neurological: He is alert and oriented to person, place, and time.    ROS  Blood pressure 95/61, pulse 67, temperature 98.1 F (36.7 C), temperature source Oral.There is no height or weight on file to calculate BMI.  General Appearance: Casual  Eye Contact:  Good  Speech:  Normal Rate  Volume:  Decreased  Mood:  Pensive  Affect:  Flat  Thought Process:  Coherent and Descriptions of Associations: Circumstantial  Orientation:  Full (Time, Place, and Person)  Thought Content:  Logical  Suicidal Thoughts:  No  Homicidal Thoughts:  No  Memory:  Immediate;   Fair Recent;   Fair Remote;   Fair  Judgement:  Intact  Insight:  Lacking  Psychomotor  Activity:  Decreased  Concentration:  Concentration: Fair and Attention Span: Fair  Recall:  [employer] of Knowledge:  Fair  Language:  Good  Akathisia:  Negative  Handed:  Right  AIMS (if indicated):     Assets:  Desire for Improvement Resilience  ADL's:  Intact  Cognition:  WNL  Sleep:  Number of Hours: 6.75     Treatment Plan Summary: Daily contact with patient to assess and evaluate symptoms and progress in treatment, Medication management and Plan : Patient is seen and examined.  Patient is a 23 year old male with the above-stated past psychiatric history who is seen in follow-up.   Diagnosis: #1 schizophrenia  Patient is seen in follow-up.  He seems to be stable.  He denied any auditory or visual hallucinations.  He denied any suicidal or homicidal ideation.  He has been compliant with his medications.  He denied any side effects to his medications.  No change in treatment at this point.  1.  Continue Haldol 10 mg p.o. 3 times daily for psychosis. 2.  Continue lithium carbonate CR 450 mg p.o. every 12 hours for mood stability. 3.  Continue temazepam 45 mg p.o. nightly for insomnia. 4.  Disposition planning-in progress.  Sharma Covert, MD 03/07/2019, 11:12 AM

## 2019-03-07 NOTE — Plan of Care (Signed)
Nurse discussed anxiety, depression and coping skills with patient.  

## 2019-03-07 NOTE — Progress Notes (Signed)
D:  Patient's self inventory form, patient has fair sleep, normal energy level.  Denied withdrawals.  Denied depression, hopeless and anxiety.  Denied SI.  Denied physical problems.  Goal is maintain composure.  Does have discharge plans. A:  Medications administered per MD orders.  Emotional support and encouragement given patient. R:  Denied SI and HI, contracts for safety.  Denied A/V hallucinations.  Safety maintained with 15 minute checks.

## 2019-03-08 NOTE — Progress Notes (Signed)
Adult Psychoeducational Group Note  Date:  03/08/2019 Time:  2:49 AM  Group Topic/Focus:  Wrap-Up Group:   The focus of this group is to help patients review their daily goal of treatment and discuss progress on daily workbooks.  Participation Level:  Did Not Attend  Participation Quality:  Did Not Attend  Affect:  Did Not Attend  Cognitive:  Did Not Attend  Insight: None  Engagement in Group:  Did Not Attend  Modes of Intervention:  Did Not Attend  Additional Comments:  Pt did not attend evening wrap up group tonight.  Candy Sledge 03/08/2019, 2:49 AM

## 2019-03-08 NOTE — Progress Notes (Signed)
Patient ID: [name], male   DOB: 1995-10-26, 23 y.o.   MRN: [id number]   12:30pm: CSW was contacted by pt's legal guardian/mother. [name] asked about an update. CSW discussed what the [facility] representative told the CSW and discussed that the patient has not been aggressive, making threats, or having HI/SI for at least the past few days. [name] grew concern about the patient being discharged and not being able to go to [facility]. CSW discussed needing to talk to the ACTT team because the patient mentioned that the ACTT team mentioned a place in [place] that the patient could go stay. CSW transferred the patient's mother to the doctor to speak to regarding discharge.   2:25pm: CSW contacted the patients ACTT through [facility]. CSW left a message for the ACTT team to call the CSW back.

## 2019-03-08 NOTE — Tx Team (Signed)
Interdisciplinary Treatment and Diagnostic Plan Update  03/08/2019 Time of Session: 10:55am [name] MRN: [id number]  Principal Diagnosis: <principal problem not specified>  Secondary Diagnoses: Active Problems:   Schizophrenia (HCC)   Current Medications:  Current Facility-Administered Medications  Medication Dose Route Frequency Provider Last Rate Last Dose  . diphenhydrAMINE (BENADRYL) capsule 50 mg  50 mg Oral TID Malvin Johns, MD   50 mg at 03/08/19 1254  . haloperidol (HALDOL) tablet 10 mg  10 mg Oral Q6H PRN Rankin, Shuvon B, NP       Or  . haloperidol lactate (HALDOL) injection 10 mg  10 mg Intramuscular Q6H PRN Rankin, Shuvon B, NP   10 mg at 02/26/19 0936  . haloperidol (HALDOL) tablet 10 mg  10 mg Oral TID Malvin Johns, MD   10 mg at 03/08/19 1255  . lithium carbonate (ESKALITH) CR tablet 450 mg  450 mg Oral Q12H Malvin Johns, MD   450 mg at 03/08/19 0751  . LORazepam (ATIVAN) injection 4 mg  4 mg Intramuscular Q4H PRN Rankin, Shuvon B, NP   4 mg at 02/26/19 0937  . magnesium hydroxide (MILK OF MAGNESIA) suspension 30 mL  30 mL Oral Daily PRN [name] A, NP   30 mL at 02/28/19 1726  . senna (SENOKOT) tablet 8.6 mg  1 tablet Oral Daily PRN [name] A, NP   8.6 mg at 03/01/19 0651  . temazepam (RESTORIL) capsule 45 mg  45 mg Oral QHS Rankin, Shuvon B, NP   45 mg at 03/07/19 2251   PTA Medications: Medications Prior to Admission  Medication Sig Dispense Refill Last Dose  . ARIPiprazole (ABILIFY IM) Inject 400 mg into the muscle every 30 (thirty) days.        Patient Stressors:    Patient Strengths:    Treatment Modalities: Medication Management, Group therapy, Case management,  1 to 1 session with clinician, Psychoeducation, Recreational therapy.   Physician Treatment Plan for Primary Diagnosis: <principal problem not specified> Long Term Goal(s): Improvement in symptoms so as ready for discharge Improvement in symptoms so as ready for  discharge   Short Term Goals: Ability to disclose and discuss suicidal ideas Ability to identify and develop effective coping behaviors will improve Ability to maintain clinical measurements within normal limits will improve Compliance with prescribed medications will improve Ability to identify and develop effective coping behaviors will improve Ability to maintain clinical measurements within normal limits will improve Compliance with prescribed medications will improve  Medication Management: Evaluate patient's response, side effects, and tolerance of medication regimen.  Therapeutic Interventions: 1 to 1 sessions, Unit Group sessions and Medication administration.  Evaluation of Outcomes: Progressing  Physician Treatment Plan for Secondary Diagnosis: Active Problems:   Schizophrenia (HCC)  Long Term Goal(s): Improvement in symptoms so as ready for discharge Improvement in symptoms so as ready for discharge   Short Term Goals: Ability to disclose and discuss suicidal ideas Ability to identify and develop effective coping behaviors will improve Ability to maintain clinical measurements within normal limits will improve Compliance with prescribed medications will improve Ability to identify and develop effective coping behaviors will improve Ability to maintain clinical measurements within normal limits will improve Compliance with prescribed medications will improve     Medication Management: Evaluate patient's response, side effects, and tolerance of medication regimen.  Therapeutic Interventions: 1 to 1 sessions, Unit Group sessions and Medication administration.  Evaluation of Outcomes: Progressing   RN Treatment Plan for Primary Diagnosis: <principal problem not  specified> Long Term Goal(s): Knowledge of disease and therapeutic regimen to maintain health will improve  Short Term Goals: Ability to participate in decision making will improve, Ability to verbalize feelings  will improve, Ability to disclose and discuss suicidal ideas, Ability to identify and develop effective coping behaviors will improve and Compliance with prescribed medications will improve  Medication Management: RN will administer medications as ordered by provider, will assess and evaluate patient's response and provide education to patient for prescribed medication. RN will report any adverse and/or side effects to prescribing provider.  Therapeutic Interventions: 1 on 1 counseling sessions, Psychoeducation, Medication administration, Evaluate responses to treatment, Monitor vital signs and CBGs as ordered, Perform/monitor CIWA, COWS, AIMS and Fall Risk screenings as ordered, Perform wound care treatments as ordered.  Evaluation of Outcomes: Progressing   LCSW Treatment Plan for Primary Diagnosis: <principal problem not specified> Long Term Goal(s): Safe transition to appropriate next level of care at discharge, Engage patient in therapeutic group addressing interpersonal concerns.  Short Term Goals: Engage patient in aftercare planning with referrals and resources and Increase skills for wellness and recovery  Therapeutic Interventions: Assess for all discharge needs, 1 to 1 time with Social worker, Explore available resources and support systems, Assess for adequacy in community support network, Educate family and significant other(s) on suicide prevention, Complete Psychosocial Assessment, Interpersonal group therapy.  Evaluation of Outcomes: Progressing   Progress in Treatment: Attending groups: No. Participating in groups: No. Taking medication as prescribed: Yes. Toleration medication: Yes. Family/Significant other contact made: Yes, individual(s) contacted:  pt's mother/legal guardian Patient understands diagnosis: No. Discussing patient identified problems/goals with staff: Yes. Medical problems stabilized or resolved: Yes. Denies suicidal/homicidal ideation:  Yes. Issues/concerns per patient self-inventory: No. Other:   New problem(s) identified: No, Describe:  None  New Short Term/Long Term Goal(s): Medication stabilization, elimination of SI thoughts, and development of a comprehensive mental wellness plan.   Patient Goals:  Patient did not attend treatment team  Discharge Plan or Barriers: Patient is currently on the inpatient waitlist for [facility]. After November 4th[facility]   Reason for Continuation of Hospitalization: Medication stabilization Other; describe Placement  Estimated Length of Stay: 5-7 days   Attendees: Patient: 03/08/2019   Physician: Dr. Johnn Hai, MD 03/08/2019  Nursing: Legrand Como, RN 03/08/2019   RN Care Manager: 03/08/2019   Social Worker: Ardelle Anton, LCSW  03/08/2019   Recreational Therapist:  03/08/2019   Other:  03/08/2019   Other:  03/08/2019   Other: 03/08/2019     Scribe for Treatment Team: Trecia Rogers, LCSW 03/08/2019 2:16 PM

## 2019-03-08 NOTE — Progress Notes (Signed)
D: Pt been sleep in room majority of the evening A: Pt was offered support and encouragement.  Pt was encourage to attend groups. Q 15 minute checks were done for safety.  R: safety maintained on unit.

## 2019-03-08 NOTE — Progress Notes (Addendum)
Patient ID: [name], male   DOB: [birth year], 21 y.o.   MRN: [id number]   CSW was contacted by [facility] which [facility]. Dalene Seltzer stated that she talked with [name] earlier in the day and he stated that since the patient is stable that he is going to discharge him tomorrow. Dalene Seltzer stated that the patient was accepted to [facility] in [place], [place] and that he can go there tomorrow with a COVID test and drug screen. Dalene Seltzer stated that the ACTT team can transport him there around 3pm on 10/27.  CSW contacted pt's mother and let her know. Patient's mother was upset with the ACTT team because they cannot provide better housing for the patient. [name] agreed with the patient going to the [facility] and with the ACTT taking him to the place. [name] stated that she will fax his consents back to the CSW.   CSW contacted [facility] and informed them of the plan and took him off of the list since he will be discharging from inpatient care tomorrow.

## 2019-03-08 NOTE — Progress Notes (Signed)
Recreation Therapy Notes  Date: 10.26.20 Time: 1006 Location: 500 Hall Dayroom  Group Topic: Wellness  Goal Area(s) Addresses:  Patient will define components of whole wellness. Patient will verbalize benefit of whole wellness.  Intervention: Music  Activity:  Exercise.  LRT led group in [name] series of stretches.  Each member the group got the chance to lead an exercise of their choice.  The group was to complete 3 rounds of exercise or 30 minutes of exercise.  Patients were instructed to take breaks and drink water as needed.  Education: Wellness, Discharge Planning.   Education Outcome: Acknowledges education/In group clarification offered/Needs additional education.   Clinical Observations/Feedback:  Pt did not attend group session.     Essex Perry, LRT/CTRS          [name] 03/08/2019 11:08 AM 

## 2019-03-08 NOTE — [facility] Group Notes (Signed)
  [facility] LCSW Group Therapy Note  Date/Time: 03/08/2019 @ 1:30pm  Type of Therapy/Topic:  Group Therapy:  Emotion Regulation  Participation Level:  Did Not Attend   Mood: Did not attend   Description of Group:    The purpose of this group is to assist patients in learning to regulate negative emotions and experience positive emotions. Patients will be guided to discuss ways in which they have been vulnerable to their negative emotions. These vulnerabilities will be juxtaposed with experiences of positive emotions or situations, and patients challenged to use positive emotions to combat negative ones. Special emphasis will be placed on coping with negative emotions in conflict situations, and patients will process healthy conflict resolution skills.  Therapeutic Goals: 1. Patient will identify two positive emotions or experiences to reflect on in order to balance out negative emotions:  2. Patient will label two or more emotions that they find the most difficult to experience:  3. Patient will be able to demonstrate positive conflict resolution skills through discussion or role plays:   Summary of Patient Progress:    Patient did not attend group therapy today.    Therapeutic Modalities:   Cognitive Behavioral Therapy Feelings Identification Dialectical Behavioral Therapy   Ardelle Anton, LCSW

## 2019-03-08 NOTE — Progress Notes (Signed)
Patient ID: [name], male   DOB: Jul 07, 1995, 23 y.o.   MRN: [id number]   CSW contacted Gwyndolyn Saxon at [facility] who is the RN on the intake coordination team for [facility].   Gwyndolyn Saxon stated that the patient is currentlty on the inpatient list until november 4th. Then he will be moved to the outpatient male waitlist for [facility]. [facility] usually waits 21 days of them being on their currently inpatient stay (so [facility]) unless he is extremely violent and then regardless of time they would automatically send him to the outpatient male wait list. Gwyndolyn Saxon stated that he will keep CSW updated.
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
## 2019-03-09 LAB — RAPID URINE DRUG SCREEN, HOSP PERFORMED
Amphetamines: NOT DETECTED
Barbiturates: NOT DETECTED
Benzodiazepines: POSITIVE — AB
Cocaine: NOT DETECTED
Opiates: NOT DETECTED
Tetrahydrocannabinol: NOT DETECTED

## 2019-03-09 MED ORDER — LITHIUM CARBONATE ER 450 MG PO TBCR: EXTENDED_RELEASE_TABLET | ORAL | 11 refills | Status: DC

## 2019-03-09 MED ORDER — HALOPERIDOL 10 MG PO TABS: ORAL_TABLET | ORAL | 11 refills | Status: DC

## 2019-03-09 MED ORDER — DIPHENHYDRAMINE HCL 50 MG PO CAPS: ORAL_CAPSULE | ORAL | 11 refills | Status: DC

## 2019-03-09 MED ORDER — TRAZODONE HCL 150 MG PO TABS: 150.0000 mg | ORAL_TABLET | Freq: Every day | ORAL | 11 refills | Status: DC

## 2019-03-09 MED ORDER — HALOPERIDOL DECANOATE 50 MG/ML IM SOLN: 200.0000 mg | INTRAMUSCULAR | 11 refills | Status: DC
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
## 2019-03-09 NOTE — [facility] Suicide Risk Assessment (Signed)
[facility] Discharge Suicide Risk Assessment   Principal Problem: schizophrenia - antisocial - threats towards mother/others Discharge Diagnoses: Active Problems:   Schizophrenia ([place])   Total Time spent with patient: 45 minutes  Musculoskeletal: Strength & Muscle Tone: within normal limits Gait & Station: normal Patient leans: N/A  Psychiatric Specialty Exam: ROS  Blood pressure 95/61, pulse 67, temperature 98.1 F (36.7 C), temperature source Oral, resp. rate 18.There is no height or weight on file to calculate BMI.  General Appearance: Casual  Eye Contact::  Good  Speech:  Clear and Coherent409  Volume:  Normal  Mood:  Euthymic  Affect:  Constricted  Thought Process:  Coherent, Goal Directed and Descriptions of Associations: Circumstantial  Orientation:  Full (Time, Place, and Person)  Thought Content:  poverty of content  Suicidal Thoughts:  No  Homicidal Thoughts:  No  Memory:  Immediate;   Poor  Judgement:  Fair  Insight:  Fair  Psychomotor Activity:  Normal  Concentration:  Fair  Recall:  [employer] of Knowledge:Fair  Language: Fair  Akathisia:  Negative  Handed:  Right  AIMS (if indicated):     Assets:  Physical Health Resilience Social Support  Sleep:  Number of Hours: 9.25  Cognition: WNL  ADL's:  Intact   Mental Status Per Nursing Assessment::   On Admission:     Demographic Factors:  Male, Living alone and Unemployed  Loss Factors: Decrease in vocational status  Historical Factors: Chronic poor compliance  Risk Reduction Factors:   Positive social support and Positive therapeutic relationship  Continued Clinical Symptoms:  Previous Psychiatric Diagnoses and Treatments  Cognitive Features That Contribute To Risk:  Polarized thinking    Suicide Risk:  Minimal: No identifiable suicidal ideation.  Patients presenting with no risk factors but with morbid ruminations; may be classified as minimal risk based on the severity of the depressive  symptoms Main risks to others-  Follow-up Information    [facility] Follow up on 03/09/2019.   Why: Your ACTT team will pick you up from [facility] on Tuesday, 10/27 at 3:00p.  They will provide transportation to [facility] information: [place] [address] [phone]           Plan Of Care/Follow-up recommendations:  Activity:  full  Tahmid Stonehocker, MD 03/09/2019, 10:47 AM

## 2019-03-09 NOTE — Progress Notes (Signed)
Pt attended spiritual care group.   Spirituality group facilitated by Simone Curia, MDiv, BCC.  Group Description:  Group focused on topic of hope.  Patients participated in facilitated discussion around topic, connecting with one another around experiences and definitions for hope.  Group members engaged with visual explorer photos, reflecting on what hope looks like for them today.  Group engaged in discussion around how their definitions of hope are present today in hospital.   Modalities: Psycho-social ed, Adlerian, Narrative, MI Patient Progress: Present throughout group.  Basir did not engage voluntarily, but responded to facilitator prompt.  Noted that he feels hope is blind and rather connected to the idea of setting goals and thinking of reasonable steps.  He did not communicate specific goals he was setting for himself.

## 2019-03-09 NOTE — Progress Notes (Signed)
Adult Psychoeducational Group Note  Date:  03/09/2019 Time:  2:43 AM  Group Topic/Focus:  Wrap-Up Group:   The focus of this group is to help patients review their daily goal of treatment and discuss progress on daily workbooks.  Participation Level:  Did Not Attend  Participation Quality:  Did Not Attend  Affect:  Did Not Attend  Cognitive:  Did Not Attend  Insight: None  Engagement in Group:  Did Not Attend  Modes of Intervention:  Did Not Attend  Additional Comments:  Pt did not attend evening wrap up group tonight.  Candy Sledge 03/09/2019, 2:43 AM

## 2019-03-09 NOTE — Progress Notes (Signed)
  [facility] Adult Case Management Discharge Plan :  Will you be returning to the same living situation after discharge:  No.; Patient is going to [facility] in [place], [place]. At discharge, do you have transportation home?: Yes,  Pt's ACTT team Do you have the ability to pay for your medications: Yes,  Private insurance  Release of information consent forms completed and in the chart;  Patient's signature needed at discharge.  Patient to Follow up at: Follow-up Information    [facility] Follow up on 03/09/2019.   Why: Your ACTT team will pick you up from [facility] on Tuesday, 10/27 at 3:00p.  They will provide transportation to [facility] information: [place] [id number] [phone]           Next level of care provider has access to [place] and Suicide Prevention discussed: Yes,  pt's mother/legal guardian     Has patient been referred to the Quitline?: Patient refused referral  Patient has been referred for addiction treatment: Yes  Trecia Rogers, LCSW 03/09/2019, 9:48 AM

## 2019-03-22 ENCOUNTER — Other Ambulatory Visit: Payer: Self-pay

## 2019-03-22 ENCOUNTER — Emergency Department (HOSPITAL_COMMUNITY)
Admission: EM | Admit: 2019-03-22 | Discharge: 2019-03-23 | Disposition: A | Discharge status: Home / Self Care | Payer: BC Managed Care – PPO | Attending: Emergency Medicine | Admitting: Emergency Medicine

## 2019-03-22 DIAGNOSIS — F17210 Nicotine dependence, cigarettes, uncomplicated: Secondary | ICD-10-CM | POA: Insufficient documentation

## 2019-03-22 DIAGNOSIS — Z20828 Contact with and (suspected) exposure to other viral communicable diseases: Secondary | ICD-10-CM | POA: Diagnosis not present

## 2019-03-22 DIAGNOSIS — F251 Schizoaffective disorder, depressive type: Secondary | ICD-10-CM | POA: Diagnosis not present

## 2019-03-22 DIAGNOSIS — R45851 Suicidal ideations: Secondary | ICD-10-CM

## 2019-03-22 DIAGNOSIS — Z046 Encounter for general psychiatric examination, requested by authority: Secondary | ICD-10-CM | POA: Diagnosis not present

## 2019-03-22 DIAGNOSIS — Z79899 Other long term (current) drug therapy: Secondary | ICD-10-CM | POA: Diagnosis not present

## 2019-03-22 DIAGNOSIS — R001 Bradycardia, unspecified: Secondary | ICD-10-CM | POA: Insufficient documentation

## 2019-03-22 DIAGNOSIS — F209 Schizophrenia, unspecified: Secondary | ICD-10-CM | POA: Diagnosis present

## 2019-03-22 DIAGNOSIS — F323 Major depressive disorder, single episode, severe with psychotic features: Secondary | ICD-10-CM

## 2019-03-22 LAB — CBC WITH DIFFERENTIAL/PLATELET
Abs Immature Granulocytes: 0.02 10*3/uL (ref 0.00–0.07)
Basophils Absolute: 0 10*3/uL (ref 0.0–0.1)
Basophils Relative: 0 %
Eosinophils Absolute: 0.1 10*3/uL (ref 0.0–0.5)
Eosinophils Relative: 2 %
HCT: 46.8 % (ref 39.0–52.0)
Hemoglobin: 15 g/dL (ref 13.0–17.0)
Immature Granulocytes: 0 %
Lymphocytes Relative: 33 %
Lymphs Abs: 1.8 10*3/uL (ref 0.7–4.0)
MCH: 27.8 pg (ref 26.0–34.0)
MCHC: 32.1 g/dL (ref 30.0–36.0)
MCV: 86.8 fL (ref 80.0–100.0)
Monocytes Absolute: 0.4 10*3/uL (ref 0.1–1.0)
Monocytes Relative: 8 %
Neutro Abs: 3 10*3/uL (ref 1.7–7.7)
Neutrophils Relative %: 57 %
Platelets: 213 10*3/uL (ref 150–400)
RBC: 5.39 MIL/uL (ref 4.22–5.81)
RDW: 14 % (ref 11.5–15.5)
WBC: 5.4 10*3/uL (ref 4.0–10.5)
nRBC: 0 % (ref 0.0–0.2)

## 2019-03-22 LAB — COMPREHENSIVE METABOLIC PANEL
ALT: 58 U/L — ABNORMAL HIGH (ref 0–44)
AST: 25 U/L (ref 15–41)
Albumin: 4.1 g/dL (ref 3.5–5.0)
Alkaline Phosphatase: 69 U/L (ref 38–126)
Anion gap: 7 (ref 5–15)
BUN: 15 mg/dL (ref 6–20)
CO2: 29 mmol/L (ref 22–32)
Calcium: 9.2 mg/dL (ref 8.9–10.3)
Chloride: 101 mmol/L (ref 98–111)
Creatinine, Ser: 0.95 mg/dL (ref 0.61–1.24)
GFR calc Af Amer: >60 mL/min (ref >60)
GFR calc non Af Amer: >60 mL/min (ref >60)
Glucose, Bld: 90 mg/dL (ref 70–99)
Potassium: 3.8 mmol/L (ref 3.5–5.1)
Sodium: 137 mmol/L (ref 135–145)
Total Bilirubin: 0.7 mg/dL (ref 0.3–1.2)
Total Protein: 7.2 g/dL (ref 6.5–8.1)

## 2019-03-22 LAB — RAPID URINE DRUG SCREEN, HOSP PERFORMED
Amphetamines: NOT DETECTED
Barbiturates: NOT DETECTED
Benzodiazepines: NOT DETECTED
Cocaine: NOT DETECTED
Opiates: NOT DETECTED
Tetrahydrocannabinol: NOT DETECTED

## 2019-03-22 LAB — ACETAMINOPHEN LEVEL: Acetaminophen (Tylenol), Serum: <10 ug/mL — ABNORMAL LOW (ref 10–30)

## 2019-03-22 LAB — URINALYSIS, ROUTINE W REFLEX MICROSCOPIC
Bilirubin Urine: NEGATIVE
Glucose, UA: NEGATIVE mg/dL
Hgb urine dipstick: NEGATIVE
Ketones, ur: NEGATIVE mg/dL
Leukocytes,Ua: NEGATIVE
Nitrite: NEGATIVE
Protein, ur: NEGATIVE mg/dL
Specific Gravity, Urine: 1.017 (ref 1.005–1.030)
pH: 8 (ref 5.0–8.0)

## 2019-03-22 LAB — ETHANOL: Alcohol, Ethyl (B): <10 mg/dL (ref <10)

## 2019-03-22 LAB — LITHIUM LEVEL: Lithium Lvl: 0.71 mmol/L (ref 0.60–1.20)

## 2019-03-22 MED ORDER — LITHIUM CARBONATE ER 450 MG PO TBCR
450.0000 mg | EXTENDED_RELEASE_TABLET | Freq: Two times a day (BID) | ORAL | Status: DC
  Administered 2019-03-22 – 2019-03-23 (×2): 450 mg via ORAL
  Filled 2019-03-22 (×2): qty 1

## 2019-03-22 MED ORDER — TRAZODONE HCL 50 MG PO TABS
150.0000 mg | ORAL_TABLET | Freq: Every day | ORAL | Status: DC
  Administered 2019-03-22: 150 mg via ORAL
  Filled 2019-03-22: qty 1

## 2019-03-22 MED ORDER — GABAPENTIN 300 MG PO CAPS
300.0000 mg | ORAL_CAPSULE | Freq: Three times a day (TID) | ORAL | Status: DC
  Administered 2019-03-22 – 2019-03-23 (×3): 300 mg via ORAL
  Filled 2019-03-22 (×3): qty 1

## 2019-03-22 MED ORDER — HALOPERIDOL 5 MG PO TABS
10.0000 mg | ORAL_TABLET | Freq: Two times a day (BID) | ORAL | Status: DC
  Administered 2019-03-22: 10 mg via ORAL
  Filled 2019-03-22 (×2): qty 2

## 2019-03-22 MED ORDER — BENZTROPINE MESYLATE 1 MG PO TABS
1.0000 mg | ORAL_TABLET | Freq: Two times a day (BID) | ORAL | Status: DC
  Administered 2019-03-22 – 2019-03-23 (×2): 1 mg via ORAL
  Filled 2019-03-22 (×2): qty 1

## 2019-03-22 NOTE — BH Assessment (Signed)
[facility] Assessment Progress Note  Case was staffed with Rankin NP who recommended patient be observed and monitored.      

## 2019-03-22 NOTE — ED Provider Notes (Signed)
[facility] DEPT Provider Note   CSN: [id number] Arrival date & time: 03/22/19  1119     History   Chief Complaint Chief Complaint  Patient presents with  . Medical Clearance    HPI [name] is a 23 y.o. male.     HPI Patient is sent over from [facility] for medical clearance.  He reportedly has been there for a week.  He was sent to the emergency department for concerns of "bradycardia, lethargic, difficult to arouse, suicidal ideation".  Patient reports that he feels fine.  He denies any pain.  He is not having headaches, chest pain or abdominal pain.  He denies he has been having any vomiting or diarrhea.  He denies he feels short of breath.  He reports he has been eating a little bit.  Patient has history of suicidal thoughts and had IVC paperwork done at [facility] with concern for suicidal ideation complicated by schizoaffective disorder and homelessness. Past Medical History:  Diagnosis Date  . ADD (attention deficit disorder)   . 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 Anemia   . Anxiety   . Bipolar disorder ([place])   . Borderline personality disorder ([place])   . Central auditory processing disorder   . Deliberate self-cutting   . Depressed   . Eczema   . Headache(784.0)   . Oppositional defiant disorder   . Schizophrenia [facility])     Patient Active Problem List   Diagnosis Date Noted  . Schizophrenia ([place]) 02/24/2019  . Schizoaffective disorder ([place]) 02/22/2019  . Suicidal ideations   . Substance induced mood disorder ([place]) 03/10/2018  . Paranoid schizophrenia ([place])   . Cannabis use disorder, moderate, dependence ([place]) 06/05/2016  . Eating disorder Unspecified 05/22/2016  . Borderline personality disorder ([place]) 05/16/2016  . Tobacco use disorder 05/15/2016    Past Surgical History:  Procedure Laterality Date  . BACK SURGERY    . FRACTURE SURGERY    . NO PAST SURGERIES    . POSTERIOR  LUMBAR FUSION N/A 02/19/2015   Procedure: LATERAL L-2 CORPECTOMY;  Surgeon: [name], MD;  Location: [place];  Service: Neurosurgery;  Laterality: N/A;  . POSTERIOR LUMBAR FUSION 4 LEVEL  02/19/2015   Procedure: Posterior T-12 - L-4 STABILIZATION OF POSTERIOR LUMBAR;  Surgeon: [name], MD;  Location: [place];  Service: Neurosurgery;;  . TIBIA IM NAIL INSERTION Left 02/20/2015   Procedure: INTRAMEDULLARY (IM) NAIL LEFT TIBIAL;  Surgeon: Rod Can, MD;  Location: [place];  Service: Orthopedics;  Laterality: Left;        Home Medications    Prior to Admission medications   Medication Sig Start Date End Date Taking? Authorizing Provider  benztropine (COGENTIN) 1 MG tablet Take 1 mg by mouth 2 (two) times daily. 02/08/19  Yes [provider]  diphenhydrAMINE (BENADRYL) 50 MG capsule 1 in am 1 at hs Patient taking differently: Take 50 mg by mouth 2 (two) times daily.  03/09/19  Yes Johnn Hai, MD  gabapentin (NEURONTIN) 300 MG capsule Take 300 mg by mouth 3 (three) times daily.   Yes [provider]  haloperidol (HALDOL) 10 MG tablet 1 in am 2 at hs Patient taking differently: Take 10 mg by mouth 2 (two) times daily.  03/09/19  Yes Johnn Hai, MD  haloperidol decanoate (HALDOL DECANOATE) 50 MG/ML injection Inject 4 mLs (200 mg total) into the muscle every 28 (twenty-eight) days. 03/09/19  Yes Johnn Hai, MD  lithium carbonate (ESKALITH) 450  MG CR tablet 1 in am 1 at h s Patient taking differently: Take 450 mg by mouth 2 (two) times daily.  03/09/19  Yes Malvin Johns, MD  traZODone (DESYREL) 150 MG tablet Take 1 tablet (150 mg total) by mouth at bedtime. 03/09/19  Yes Malvin Johns, MD    Family History No family history on file.  Social History Social History   Tobacco Use  . Smoking status: Current Every Day Smoker    Packs/day: 2.00    Types: Cigarettes  . Smokeless tobacco: Never Used  . Tobacco comment: 1-3 packs   Substance Use Topics  .  Alcohol use: Yes    Alcohol/week: 2.0 standard drinks    Types: 2 Shots of liquor per week  . Drug use: Yes    Types: Cocaine, Marijuana    Comment: heroin     Allergies   Clozapine, Prolixin [fluphenazine], and Risperdal [risperidone]   Review of Systems Review of Systems 10 Systems reviewed and are negative for acute change except as noted in the HPI.  Physical Exam Updated Vital Signs BP (!) 105/50 (BP Location: Right Arm)   Pulse (!) 55   Temp 98.6 F (37 C) (Oral)   Resp 20   Ht 5\' 8"  (1.727 m)   Wt 68 kg   SpO2 100%   BMI 22.81 kg/m   Physical Exam Constitutional:      Appearance: He is well-developed.  HENT:     Head: Normocephalic and atraumatic.  Eyes:     Extraocular Movements: Extraocular movements intact.  Neck:     Musculoskeletal: Neck supple.  Cardiovascular:     Rate and Rhythm: Normal rate and regular rhythm.     Heart sounds: Normal heart sounds.  Pulmonary:     Effort: Pulmonary effort is normal.     Breath sounds: Normal breath sounds.  Abdominal:     General: Bowel sounds are normal. There is no distension.     Palpations: Abdomen is soft.     Tenderness: There is no abdominal tenderness.  Musculoskeletal: Normal range of motion.  Skin:    General: Skin is warm and dry.  Neurological:     Mental Status: He is alert and oriented to person, place, and time.     GCS: GCS eye subscore is 4. GCS verbal subscore is 5. GCS motor subscore is 6.     Coordination: Coordination normal.  Psychiatric:     Comments: Affect is flat but patient answers questions.      ED Treatments / Results  Labs (all labs ordered are listed, but only abnormal results are displayed) Labs Reviewed  COMPREHENSIVE METABOLIC PANEL - Abnormal; Notable for the following components:      Result Value   ALT 58 (*)    All other components within normal limits  ACETAMINOPHEN LEVEL - Abnormal; Notable for the following components:   Acetaminophen (Tylenol), Serum <10  (*)    All other components within normal limits  SARS CORONAVIRUS 2 (TAT 6-24 HRS)  ETHANOL  CBC WITH DIFFERENTIAL/PLATELET  LITHIUM LEVEL  URINALYSIS, ROUTINE W REFLEX MICROSCOPIC  RAPID URINE DRUG SCREEN, HOSP PERFORMED    EKG None  Radiology No results found.  Procedures Procedures (including critical care time)  Medications Ordered in ED Medications - No data to display   Initial Impression / Assessment and Plan / ED Course  I have reviewed the triage vital signs and the nursing notes.  Pertinent labs & imaging results that were available during  my care of the patient were reviewed by me and considered in my medical decision making (see chart for details).       Patient is nontoxic in appearance.  He is afebrile.  Review of EMR indicates that he has history of heart rate in the 50s without complications.  Patient has no complaints of shortness of breath, lightheadedness or near syncope.  His affect is flat but he is not delirious or obtunded.  At this time, I do not suspect that the [facility] is related to any active medical disorder.  It appears a flat affect and quiet demeanor are consistent with patient's current depression episode.  Basic lab work obtained.  Patient is medically cleared for further psychiatric evaluation and treatment.  Final Clinical Impressions(s) / ED Diagnoses   Final diagnoses:  Current severe episode of major depressive disorder with psychotic features, unspecified whether recurrent [facility](HCC)    ED Discharge Orders    None       Arby BarrettePfeiffer, Loralee Weitzman, MD 03/22/19 1620

## 2019-03-22 NOTE — ED Triage Notes (Signed)
03/22/2019  1147 Patient denies SI/HI.

## 2019-03-22 NOTE — BH Assessment (Addendum)
Assessment Note  [name] is an 23 y.o. male that presents this date with IVC from [place]. Per IVC patient has a history Schizoaffective disorder and voices S/I. Patient denies any immediate plan. The patient's mental status and medical condition has rapidly decompensated and he needs a higher level of care. Patient was transported to [facility]. Patient will not verbalize why he presents this date and appears to be very disorganized. Patient blankly stares at this writer and does not seem to process the content of this writer's questions. Patient does nod his head yes when asked in reference to S/I. Patient does not voice a plan. Patient shakes his head no in reference to H/I. Patient did not seem to understand assessment question in reference to AVH although when this writer explained in detail several times patient denies. Patient will not voice any other details of why he is presenting this date. Information to complete assessment was obtained from prior history and notes. Per notes patient legal guardian is [name] [phone]. This Probation officer attempted to contact guardian unsuccessfully. Patient per history has had multiple admissions to numerous hospitalizations at [facility], [place], and [facility]. Patient is well known to the ED and was last seen on 01/26/19 when he presented with the same symptoms. Patient has a history of cocaine and cannabis use with BAL and UDS pending. Patient has currently been receiving services from [place] team It is unclear if patient is currently complaint with his medication regimen. Patient was alert although would not respond when asked in reference to orientation. His thoughts were disorganized and he speech was slow. His mood depressed but affect was incongruent, smiling at points during assessment. His insight, judgement, and impulse control are poor. He did not appear to be responding to internal stimuli at time of assessment however seemed  to be delusional. Case was staffed with [facility] NP who recommended patient be observed and monitored.   Diagnosis: F25.9 Schizoaffective disorder   Past Medical History:  Past Medical History:  Diagnosis Date  . ADD (attention deficit disorder)   . 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 Anemia   . Anxiety   . Bipolar disorder ([place])   . Borderline personality disorder ([place])   . Central auditory processing disorder   . Deliberate self-cutting   . Depressed   . Eczema   . Headache(784.0)   . Oppositional defiant disorder   . Schizophrenia [facility])     Past Surgical History:  Procedure Laterality Date  . BACK SURGERY    . FRACTURE SURGERY    . NO PAST SURGERIES    . POSTERIOR LUMBAR FUSION N/A 02/19/2015   Procedure: LATERAL L-2 CORPECTOMY;  Surgeon: [name], MD;  Location: [place];  Service: Neurosurgery;  Laterality: N/A;  . POSTERIOR LUMBAR FUSION 4 LEVEL  02/19/2015   Procedure: Posterior T-12 - L-4 STABILIZATION OF POSTERIOR LUMBAR;  Surgeon: [name], MD;  Location: [place];  Service: Neurosurgery;;  . TIBIA IM NAIL INSERTION Left 02/20/2015   Procedure: INTRAMEDULLARY (IM) NAIL LEFT TIBIAL;  Surgeon: Rod Can, MD;  Location: [place];  Service: Orthopedics;  Laterality: Left;    Family History: No family history on file.  Social History:  reports that he has been smoking cigarettes. He has been smoking about 2.00 packs per day. He has never used smokeless tobacco. He reports current alcohol use of about 2.0 standard drinks of alcohol per week. He reports current drug use. Drugs: Cocaine and  Marijuana.  Additional Social History:  Alcohol / Drug Use Pain Medications: See MAR Prescriptions: See MAR Over the Counter: See MAR History of alcohol / drug use?: Yes Longest period of sobriety (when/how long): Unknown Negative Consequences of Use: (Denies) Withdrawal Symptoms: (Denies) Substance #1 Name of Substance 1: Cannabis  per history 1 - Age of First Use: UTA 1 - Amount (size/oz): UTA 1 - Frequency: UTA 1 - Duration: UTA 1 - Last Use / Amount: UTA  CIWA: CIWA-Ar BP: (!) 98/46 Pulse Rate: 92 COWS:    Allergies:  Allergies  Allergen Reactions  . Clozapine Hives and Other (See Comments)    Increased blood pressure  . Prolixin [Fluphenazine] Other (See Comments)    Hallucinations  . Risperdal [Risperidone] Other (See Comments)    Unknown    Home Medications: (Not in a hospital admission)   OB/GYN Status:  No LMP for male patient.  General Assessment Data [facility] of Assessment: [facility] TTS Assessment: In system Is this a Tele or Face-to-Face Assessment?: Face-to-Face Is this an Initial Assessment or a Re-assessment for this encounter?: Initial Assessment Patient Accompanied by:: N/A Language Other than English: No Living Arrangements: Other (Comment) What gender do you identify as?: Male Marital status: Single Living Arrangements: Other relatives Can pt return to current living arrangement?: Yes Admission Status: Involuntary Petitioner: Other Is patient capable of signing voluntary admission?: Yes Referral Source: Other([facility]) Insurance type: Tax adviserBC/BS  Medical Screening Exam [facility] Walk-in ONLY) Medical Exam completed: Yes  Crisis Care Plan Living Arrangements: Other relatives Legal Guardian: (NA) Name of Psychiatrist: [facility] Name of Therapist: [facility]  Education Status Is patient currently in school?: No Highest grade of school patient has completed: GED Is the patient employed, unemployed or receiving disability?: Unemployed  Risk to self with the past 6 months Suicidal Ideation: Yes-Currently Present Has patient been a risk to self within the past 6 months prior to admission? : Yes Suicidal Intent: No Has patient had any suicidal intent within the past 6 months prior to admission? : Yes Is patient at risk for suicide?: Yes Suicidal Plan?:  No Has patient had any suicidal plan within the past 6 months prior to admission? : Yes Specify Current Suicidal Plan: Denies current plan Access to Means: No What has been your use of drugs/alcohol within the last 12 months?: Current use Previous Attempts/Gestures: Yes How many times?: (Multiple) Other Self Harm Risks: (NA) Triggers for Past Attempts: Unknown Intentional Self Injurious Behavior: None Family Suicide History: No Recent stressful life event(s): Other (Comment)(Medication non compliance) Persecutory voices/beliefs?: No Depression: Yes Depression Symptoms: (Pt is vague in ref to symptoms) Substance abuse history and/or treatment for substance abuse?: No Suicide prevention information given to non-admitted patients: Not applicable  Risk to Others within the past 6 months Homicidal Ideation: No Does patient have any lifetime risk of violence toward others beyond the six months prior to admission? : No Thoughts of Harm to Others: No Current Homicidal Intent: No Current Homicidal Plan: No Access to Homicidal Means: No Identified Victim: NA History of harm to others?: No Assessment of Violence: None Noted Violent Behavior Description: NA Does patient have access to weapons?: No Criminal Charges Pending?: No Does patient have a court date: No Is patient on probation?: No  Psychosis Hallucinations: None noted Delusions: None noted  Mental Status Report Appearance/Hygiene: In scrubs Eye Contact: Poor Motor Activity: Freedom of movement Speech: Slow Level of Consciousness: Quiet/awake Mood: Depressed Affect: Sad Anxiety Level: Minimal Thought Processes: Unable to  Assess Judgement: Unable to Assess Orientation: Unable to assess Obsessive Compulsive Thoughts/Behaviors: Unable to Assess  Cognitive Functioning Concentration: Unable to Assess Memory: Unable to Assess Is patient IDD: No Insight: Unable to Assess Impulse Control: Unable to Assess Appetite:  (UTA) Have you had any weight changes? : (UTA) Sleep: (UTA) Total Hours of Sleep: (UTA) Vegetative Symptoms: (UTA)  ADLScreening [facility] Assessment Services) Patient's cognitive ability adequate to safely complete daily activities?: Yes Patient able to express need for assistance with ADLs?: Yes Independently performs ADLs?: Yes (appropriate for developmental age)  Prior Inpatient Therapy Prior Inpatient Therapy: Yes Prior Therapy Dates: (Multiple) Prior [facility]Provider[facility]): [facility] Reason for Treatment: MH issues  Prior Outpatient Therapy Prior Outpatient Therapy: Yes Prior Therapy Dates: Ongoing Prior Therapy Facilty/Provider(s): [facility] Reason for Treatment: Med mang Does patient have an ACCT team?: Yes Does patient have Intensive In-House Services?  : No Does patient have [facility] services? : Yes Does patient have P4CC services?: No  ADL Screening (condition at time of admission) Patient's cognitive ability adequate to safely complete daily activities?: Yes Is the patient deaf or have difficulty hearing?: No Does the patient have difficulty seeing, even when wearing glasses/contacts?: No Does the patient have difficulty concentrating, remembering, or making decisions?: No Patient able to express need for assistance with ADLs?: Yes Does the patient have difficulty dressing or bathing?: No Independently performs ADLs?: Yes (appropriate for developmental age) Does the patient have difficulty walking or climbing stairs?: No Weakness of Legs: None Weakness of Arms/Hands: None  Home Assistive Devices/Equipment Home Assistive Devices/Equipment: None  Therapy Consults (therapy consults require a physician order) PT Evaluation Needed: No OT Evalulation Needed: No SLP Evaluation Needed: No Abuse/Neglect Assessment (Assessment to be complete while patient is alone) Physical Abuse: Denies Verbal Abuse: Denies Sexual Abuse: Denies Exploitation of  patient/patient's resources: Denies Self-Neglect: Denies Values / Beliefs Cultural Requests During Hospitalization: None Spiritual Requests During Hospitalization: None Consults Spiritual Care Consult Needed: No Social Work Consult Needed: No Merchant navy officer (For Healthcare) Does Patient Have a Medical Advance Directive?: No Would patient like information on creating a medical advance directive?: No - Patient declined          Disposition:  Case was staffed with [facility] NP who recommended patient be observed and monitored.   Disposition Initial Assessment Completed for this Encounter: Yes Disposition of Patient: (Observe and monitor)  On Site Evaluation by:   Reviewed with Physician:    Alfredia Ferguson 03/22/2019 6:38 PM

## 2019-03-22 NOTE — Progress Notes (Signed)
03/22/19  1510  Labs drawn. Light green, dark green, red, gold, and purple tubes sent to main lab.

## 2019-03-23 ENCOUNTER — Encounter (HOSPITAL_COMMUNITY): Payer: Self-pay | Admitting: Registered Nurse

## 2019-03-23 LAB — SARS CORONAVIRUS 2 (TAT 6-24 HRS): SARS Coronavirus 2: NEGATIVE

## 2019-03-23 NOTE — ED Provider Notes (Signed)
Patient complaining of dizziness, noted to have heart rate in the low 40s.  This may not be abnormal for a healthy young patient.  Will check ECG and orthostatic vital signs.  ECG is significant for sinus bradycardia.  Orthostatic vital signs do show a significant rise in heart rate with standing but no significant change in blood pressure.  This is felt to be secondary to relative dehydration and he will be encouraged to drink oral fluids.  ECG interpretation: Sinus bradycardia rate of 44.  Normal axis.  Normal intervals.  Normal ST and T wave.  When compared with ECG of December 18, 2018, heart rate has decreased.   Delora Fuel, MD 35/57/32 [phone]

## 2019-03-23 NOTE — Discharge Instructions (Signed)
For your behavioral health needs, you are advised to continue treatment with the [facility] ACT Team: ° °     [facility] °     [address] °     [place], [place] [address] °     [phone] °

## 2019-03-23 NOTE — ED Notes (Signed)
Informed patient's legal guardian, mother of discharged

## 2019-03-23 NOTE — ED Notes (Signed)
PT reporting dizziness after waking and finding that scrubs and sheets were wet. Vitals obtained and Dr.Glick notified of pts heart rate. Orders for Orthostatic vital signs and EKG to be placed by Dr.Glick.

## 2019-03-23 NOTE — BH Assessment (Addendum)
[place] Assessment Progress Note  Per Shuvon Rankin, FNP, this pt does not require psychiatric hospitalization at this time.  Pt is to be discharged from [facility] with recommendation to continue treatment with the [facility].  This has been included in pt's discharge instructions.  At 11:52 this Probation officer called [facility] and spoke to [place].  She reports that they are not able to provide transportation at this time due to Covid-19.  At 11:56 I called pt's mother/legal guardian, [name] [phone]), to notify her of pt's disposition.  Call rolled to voice mail and I left a HIPAA compliant message requesting a return call.  As of this writing, return call is pending.  Pt's nurse, Diane, has been notified.  Jalene Mullet, MA Triage Specialist [phone]   Addendum:  Pt presents under IVC initiated by [facility] staff, which Neita Garnet, MD has rescinded.  Jalene Mullet, [place] Coordinator [phone]

## 2019-03-23 NOTE — ED Notes (Signed)
Pt discharged home. Discharged instructions read to pt who verbalized understanding. All belongings returned to pt who signed for same. Denies SI/HI, is not delusional and not responding to internal stimuli. Escorted pt to the ED exit.    

## 2019-03-23 NOTE — Consult Note (Addendum)
[facility] Psych ED Discharge  03/23/2019 12:05 PM [name]  MRN:  [id number] Principal Problem: Schizophrenia [name]) Discharge Diagnoses: Principal Problem:   Schizophrenia ([place]) Active Problems:   Suicidal ideations   Subjective: [name], 23 y.o., male patient seen via tele psych by this provider, [name]; and chart reviewed on 03/23/19.  On evaluation [name] reports he was sent to the emergency room from [name] where he had been for couple of days.  Patient reports medication adjustments were made and that he is tolerating medications without adverse reaction.  Patient denies suicide/self-harm/homicidal ideations, psychosis, paranoia.  Patient reports that he is sleeping and eating without any difficulty.  Patient also states that [facility]  Patient states he feels that he is able to go home and does not need inpatient psychiatric admission. During evaluation [name] is alert/oriented x 4; calm/cooperative; and mood is congruent with affect.  He does not appear to be responding to internal/external stimuli or delusional thoughts.  Patient denies suicidal/self-harm/homicidal ideation, psychosis, and paranoia.  Patient answered question appropriately.  Spoke with [name] who is a psychiatrist for [facility] states that he is okay with patient going home and that he will contact ACT team.   Total Time spent with patient: 30 minutes  Past Psychiatric History: See below  Past Medical History:  Past Medical History:  Diagnosis Date  . ADD (attention deficit disorder)   . 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 Anemia   . Anxiety   . Bipolar disorder ([place])   . Borderline personality disorder ([place])   . Central auditory processing disorder   . Deliberate self-cutting   . Depressed   . Eczema   . Headache(784.0)   . Oppositional defiant disorder   .  Schizophrenia [facility])     Past Surgical History:  Procedure Laterality Date  . BACK SURGERY    . FRACTURE SURGERY    . NO PAST SURGERIES    . POSTERIOR LUMBAR FUSION N/A 02/19/2015   Procedure: LATERAL L-2 CORPECTOMY;  Surgeon: [name], MD;  Location: [place];  Service: Neurosurgery;  Laterality: N/A;  . POSTERIOR LUMBAR FUSION 4 LEVEL  02/19/2015   Procedure: Posterior T-12 - L-4 STABILIZATION OF POSTERIOR LUMBAR;  Surgeon: [name], MD;  Location: [place];  Service: Neurosurgery;;  . TIBIA IM NAIL INSERTION Left 02/20/2015   Procedure: INTRAMEDULLARY (IM) NAIL LEFT TIBIAL;  Surgeon: Rod Can, MD;  Location: [place];  Service: Orthopedics;  Laterality: Left;   Family History: History reviewed. No pertinent family history. Family Psychiatric  History: Unaware Social History:  Social History   Substance and Sexual Activity  Alcohol Use Yes  . Alcohol/week: 2.0 standard drinks  . Types: 2 Shots of liquor per week     Social History   Substance and Sexual Activity  Drug Use Yes  . Types: Cocaine, Marijuana   Comment: heroin    Social History   Socioeconomic History  . Marital status: Single    Spouse name: Not on file  . Number of children: Not on file  . Years of education: Not on file  . Highest education level: Not on file  Occupational History  . Occupation: Unemployed  Social Needs  . Financial resource strain: Not on file  . Food insecurity    Worry: Not on file    Inability: Not on file  . Transportation needs    Medical: Not  on file    Non-medical: Not on file  Tobacco Use  . Smoking status: Current Every Day Smoker    Packs/day: 2.00    Types: Cigarettes  . Smokeless tobacco: Never Used  . Tobacco comment: 1-3 packs   Substance and Sexual Activity  . Alcohol use: Yes    Alcohol/week: 2.0 standard drinks    Types: 2 Shots of liquor per week  . Drug use: Yes    Types: Cocaine, Marijuana    Comment: heroin  . Sexual activity: Yes     Birth control/protection: None    Comment: pt reluctant to answer questions and frequently stated " I dont know"  Lifestyle  . Physical activity    Days per week: Not on file    Minutes per session: Not on file  . Stress: Not on file  Relationships  . Social Musicianconnections    Talks on phone: Not on file    Gets together: Not on file    Attends religious service: Not on file    Active member of club or organization: Not on file    Attends meetings of clubs or organizations: Not on file    Relationship status: Not on file  Other Topics Concern  . Not on file  Social History Narrative   ** Merged History Encounter **    Pt reported that he is unemployed, homeless as his mother is in rehab    Has this patient used any form of tobacco in the last 30 days? (Cigarettes, Smokeless Tobacco, Cigars, and/or Pipes) A prescription for an FDA-approved tobacco cessation medication was offered at discharge and the patient refused  Current Medications: Current Facility-Administered Medications  Medication Dose Route Frequency Provider Last Rate Last Dose  . benztropine (COGENTIN) tablet 1 mg  1 mg Oral BID Arby BarrettePfeiffer, Marcy, MD   1 mg at 03/23/19 1014  . gabapentin (NEURONTIN) capsule 300 mg  300 mg Oral TID Arby BarrettePfeiffer, Marcy, MD   300 mg at 03/23/19 1013  . haloperidol (HALDOL) tablet 10 mg  10 mg Oral BID Arby BarrettePfeiffer, Marcy, MD   10 mg at 03/22/19 2300  . lithium carbonate (ESKALITH) CR tablet 450 mg  450 mg Oral BID Arby BarrettePfeiffer, Marcy, MD   450 mg at 03/23/19 1014  . traZODone (DESYREL) tablet 150 mg  150 mg Oral QHS Arby BarrettePfeiffer, Marcy, MD   150 mg at 03/22/19 2300   Current Outpatient Medications  Medication Sig Dispense Refill  . benztropine (COGENTIN) 1 MG tablet Take 1 mg by mouth 2 (two) times daily.    . diphenhydrAMINE (BENADRYL) 50 MG capsule 1 in am 1 at hs (Patient taking differently: Take 50 mg by mouth 2 (two) times daily. ) 60 capsule 11  . gabapentin (NEURONTIN) 300 MG capsule Take 300 mg by  mouth 3 (three) times daily.    . haloperidol decanoate (HALDOL DECANOATE) 50 MG/ML injection Inject 4 mLs (200 mg total) into the muscle every 28 (twenty-eight) days. 4 mL 11  . lithium carbonate (ESKALITH) 450 MG CR tablet 1 in am 1 at h s (Patient taking differently: Take 450 mg by mouth 2 (two) times daily. ) 60 tablet 11  . traZODone (DESYREL) 150 MG tablet Take 1 tablet (150 mg total) by mouth at bedtime. 30 tablet 11   PTA Medications: (Not in a hospital admission)   Musculoskeletal: Strength & Muscle Tone: within normal limits Gait & Station: normal Patient leans: N/A  Psychiatric Specialty Exam: Physical Exam  ROS  Blood pressure  116/78, pulse (!) 42, temperature 97.9 F (36.6 C), temperature source Oral, resp. rate 17, height 5\' 8"  (1.727 m), weight 68 kg, SpO2 99 %.Body mass index is 22.81 kg/m.  General Appearance: Casual  Eye Contact:  Good  Speech:  Clear and Coherent and Normal Rate  Volume:  Normal  Mood:  " Good."  Appropriate  Affect:  Appropriate and Congruent  Thought Process:  Coherent, Goal Directed and Descriptions of Associations: Intact  Orientation:  Full (Time, Place, and Person)  Thought Content:  WDL  Suicidal Thoughts:  No  Homicidal Thoughts:  No  Memory:  Immediate;   Good Recent;   Good  Judgement:  Intact  Insight:  Present  Psychomotor Activity:  Normal  Concentration:  Concentration: Good and Attention Span: Good  Recall:  Good  Fund of Knowledge:  Fair  Language:  Fair  Akathisia:  No  Handed:  Right  AIMS (if indicated):     Assets:  Communication Skills Desire for Improvement Social Support  ADL's:  Intact  Cognition:  WNL  Sleep:        Demographic Factors:  Male  Loss Factors: Financial problems/change in socioeconomic status  Historical Factors: NA  Risk Reduction Factors:   Religious beliefs about death, Positive social support and Positive therapeutic relationship  Continued Clinical Symptoms:   Alcohol/Substance Abuse/Dependencies Previous Psychiatric Diagnoses and Treatments  Cognitive Features That Contribute To Risk:  None    Suicide Risk:  Minimal: No identifiable suicidal ideation.  Patients presenting with no risk factors but with morbid ruminations; may be classified as minimal risk based on the severity of the depressive symptoms    Plan Of Care/Follow-up recommendations:  Activity:  As tolerated Diet:  Heart healthy Other:  Follow up with ACT team   Disposition: No evidence of imminent risk to self or others at present.   Patient does not meet criteria for psychiatric inpatient admission. Supportive therapy provided about ongoing stressors. Discussed crisis plan, support from social network, calling 911, coming to the Emergency Department, and calling Suicide Hotline.  Shuvon Rankin, NP 03/23/2019, 12:05 PM   Attest to NP  note

## 2019-03-23 NOTE — ED Notes (Signed)
Per Hanley Seamen RN who spoke with pt's guardian. [name] did not object to patient being discharged and pt's mother said that she would not be picking him up.

## 2019-03-23 NOTE — ED Notes (Signed)
EKG given to [facility] and order for pt to increase PO fluids at this time.

## 2019-03-28 ENCOUNTER — Encounter (HOSPITAL_COMMUNITY): Payer: Self-pay | Admitting: Emergency Medicine

## 2019-03-28 ENCOUNTER — Other Ambulatory Visit: Payer: Self-pay

## 2019-03-28 ENCOUNTER — Emergency Department (HOSPITAL_COMMUNITY)
Admission: EM | Admit: 2019-03-28 | Discharge: 2019-03-28 | Discharge status: Against Medical Advice | Payer: BC Managed Care – PPO | Attending: Emergency Medicine | Admitting: Emergency Medicine

## 2019-03-28 DIAGNOSIS — F17210 Nicotine dependence, cigarettes, uncomplicated: Secondary | ICD-10-CM | POA: Insufficient documentation

## 2019-03-28 DIAGNOSIS — F209 Schizophrenia, unspecified: Secondary | ICD-10-CM | POA: Insufficient documentation

## 2019-03-28 DIAGNOSIS — F909 Attention-deficit hyperactivity disorder, unspecified type: Secondary | ICD-10-CM | POA: Diagnosis not present

## 2019-03-28 DIAGNOSIS — Z79899 Other long term (current) drug therapy: Secondary | ICD-10-CM | POA: Insufficient documentation

## 2019-03-28 DIAGNOSIS — R1084 Generalized abdominal pain: Secondary | ICD-10-CM | POA: Insufficient documentation

## 2019-03-28 DIAGNOSIS — R109 Unspecified abdominal pain: Secondary | ICD-10-CM | POA: Diagnosis present

## 2019-03-28 DIAGNOSIS — Z5329 Procedure and treatment not carried out because of patient's decision for other reasons: Secondary | ICD-10-CM | POA: Insufficient documentation

## 2019-03-28 MED ORDER — SODIUM CHLORIDE 0.9% FLUSH: 3.0000 mL | Freq: Once | INTRAVENOUS | Status: DC

## 2019-03-28 MED ORDER — ONDANSETRON HCL 4 MG/2ML IJ SOLN: 4.0000 mg | Freq: Once | INTRAMUSCULAR | Status: DC

## 2019-03-28 MED ORDER — SODIUM CHLORIDE 0.9 % IV BOLUS (SEPSIS): 1000.0000 mL | Freq: Once | INTRAVENOUS | Status: DC

## 2019-03-28 MED ORDER — KETOROLAC TROMETHAMINE 15 MG/ML IJ SOLN: 15.0000 mg | Freq: Once | INTRAMUSCULAR | Status: DC

## 2019-03-28 NOTE — ED Triage Notes (Signed)
C/o generalized abd pain and constipation x 2 weeks.  States he has done 4 enemas today without any results.  Reports nausea and vomited x 1.

## 2019-03-28 NOTE — ED Provider Notes (Signed)
[facility] Note   CSN: [id number] Arrival date & time: 03/28/19  1045     History   Chief Complaint Chief Complaint  Patient presents with  . Abdominal Pain    HPI [name] is a 23 y.o. male.     Patient is a 23 year old male with multiple psychiatric problems including borderline personality disorder, bipolar disorder, ADHD, schizophrenia presenting to the emergency department for multiple complaints.  He reports that over the past several weeks he has just felt unwell.  Reports that he has had generalized abdominal pain for 1 week.  Reports that he tried 4 enemas at home because he thought he was constipated but this did not produce any results.  Reports occasional bright red blood in his stool with rectal pain.  He also reports a couple episodes of nonbloody vomiting.  Denies any fever, chills, chest pain, shortness of breath.  Reports that he thinks this might be related to Haldol that he was given because it is making him eat a lot more.  He is not sure why he was given Haldol.  He has had no abdominal surgeries or history of the same in the past.     Past Medical History:  Diagnosis Date  . ADD (attention deficit disorder)   . 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 Anemia   . Anxiety   . Bipolar disorder ([facility])   . Borderline personality disorder ([facility])   . Central auditory processing disorder   . Deliberate self-cutting   . Depressed   . Eczema   . Headache(784.0)   . Oppositional defiant disorder   . Schizophrenia [facility])     Patient Active Problem List   Diagnosis Date Noted  . Schizophrenia ([facility]) 02/24/2019  . Schizoaffective disorder ([facility]) 02/22/2019  . Suicidal ideations   . Substance induced mood disorder ([facility]) 03/10/2018  . Paranoid schizophrenia ([facility])   . Cannabis use disorder, moderate, dependence ([facility]) 06/05/2016  . Eating disorder Unspecified  05/22/2016  . Borderline personality disorder ([facility]) 05/16/2016  . Tobacco use disorder 05/15/2016    Past Surgical History:  Procedure Laterality Date  . BACK SURGERY    . FRACTURE SURGERY    . NO PAST SURGERIES    . POSTERIOR LUMBAR FUSION N/A 02/19/2015   Procedure: LATERAL L-2 CORPECTOMY;  Surgeon: Lisbeth RenshawNeelesh Nundkumar, MD;  [facility];  Service: Neurosurgery;  Laterality: N/A;  . POSTERIOR LUMBAR FUSION 4 LEVEL  02/19/2015   Procedure: Posterior T-12 - L-4 STABILIZATION OF POSTERIOR LUMBAR;  Surgeon: Lisbeth RenshawNeelesh Nundkumar, MD;  Location: [facility] OR;  Service: Neurosurgery;;  . TIBIA IM NAIL INSERTION Left 02/20/2015   Procedure: INTRAMEDULLARY (IM) NAIL LEFT TIBIAL;  Surgeon: Samson FredericBrian Swinteck, MD;  Location[facility];  Service: Orthopedics;  Laterality: Left;        Home Medications    Prior to Admission medications   Medication Sig Start Date End Date Taking? Authorizing Provider  benztropine (COGENTIN) 1 MG tablet Take 1 mg by mouth 2 (two) times daily. 02/08/19   [provider]  diphenhydrAMINE (BENADRYL) 50 MG capsule 1 in am 1 at hs Patient taking differently: Take 50 mg by mouth 2 (two) times daily.  03/09/19   Malvin JohnsFarah, Brian, MD  gabapentin (NEURONTIN) 300 MG capsule Take 300 mg by mouth 3 (three) times daily.    [provider]  haloperidol decanoate (HALDOL DECANOATE) 50 MG/ML injection Inject 4 mLs (200 mg total) into the muscle every  28 (twenty-eight) days. 03/09/19   Malvin Johns, MD  lithium carbonate (ESKALITH) 450 MG CR tablet 1 in am 1 at h s Patient taking differently: Take 450 mg by mouth 2 (two) times daily.  03/09/19   Malvin Johns, MD  traZODone (DESYREL) 150 MG tablet Take 1 tablet (150 mg total) by mouth at bedtime. 03/09/19   Malvin Johns, MD    Family History No family history on file.  Social History Social History   Tobacco Use  . Smoking status: Current Every Day Smoker    Packs/day: 2.00    Types: Cigarettes  . Smokeless tobacco: Never  Used  . Tobacco comment: 1-3 packs   Substance Use Topics  . Alcohol use: Yes    Alcohol/week: 2.0 standard drinks    Types: 2 Shots of liquor per week  . Drug use: Yes    Types: Cocaine, Marijuana    Comment: heroin     Allergies   Clozapine, Prolixin [fluphenazine], and Risperdal [risperidone]   Review of Systems Review of Systems  Constitutional: Positive for appetite change. Negative for chills and fever.  HENT: Negative for congestion and sore throat.   Respiratory: Negative for cough and shortness of breath.   Cardiovascular: Negative for chest pain.  Gastrointestinal: Positive for abdominal pain, blood in stool, constipation, nausea and vomiting. Negative for diarrhea.  Endocrine: Negative for polyuria.  Genitourinary: Negative for dysuria.  Musculoskeletal: Negative for back pain.  Skin: Negative for rash.  Neurological: Negative for dizziness, light-headedness and headaches.     Physical Exam Updated Vital Signs BP (!) 116/59 (BP Location: Right Arm)   Pulse 79   Temp 98.1 F (36.7 C) (Oral)   Resp 16   SpO2 100%   Physical Exam Vitals signs and nursing note reviewed.  Constitutional:      General: He is not in acute distress.    Appearance: Normal appearance. He is well-developed. He is not ill-appearing, toxic-appearing or diaphoretic.  HENT:     Head: Normocephalic.  Eyes:     Conjunctiva/sclera: Conjunctivae normal.  Cardiovascular:     Rate and Rhythm: Normal rate.  Pulmonary:     Effort: Pulmonary effort is normal.  Abdominal:     General: Abdomen is flat. Bowel sounds are normal.     Palpations: Abdomen is soft.     Tenderness: There is no abdominal tenderness.  Skin:    General: Skin is dry.  Neurological:     General: No focal deficit present.     Mental Status: He is alert.  Psychiatric:        Mood and Affect: Mood normal.     Comments: Flat affect      ED Treatments / Results  Labs (all labs ordered are listed, but only  abnormal results are displayed) Labs Reviewed  LIPASE, BLOOD  COMPREHENSIVE METABOLIC PANEL  CBC  URINALYSIS, ROUTINE W REFLEX MICROSCOPIC  POC OCCULT BLOOD, ED    EKG None  Radiology No results found.  Procedures Procedures (including critical care time)  Medications Ordered in ED Medications  sodium chloride flush (NS) 0.9 % injection 3 mL (has no administration in time range)  ondansetron (ZOFRAN) injection 4 mg (has no administration in time range)  sodium chloride 0.9 % bolus 1,000 mL (has no administration in time range)  ketorolac (TORADOL) 15 MG/ML injection 15 mg (has no administration in time range)     Initial Impression / Assessment and Plan / ED Course  I have reviewed the triage  vital signs and the nursing notes.  Pertinent labs & imaging results that were available during my care of the patient were reviewed by me and considered in my medical decision making (see chart for details).  Clinical Course as of Mar 27 1132  Sun Mar 28, 2019  1132 After my evaluation I noticed the patient pacing around in his room. I asked if he needed assistance and he stated he wanted to leave and did not want to wait for his treatment or workup.  We discussed the nature and purpose, risks and benefits, as well as, the alternatives of treatment. Time was given to allow the opportunity to ask questions and consider their options, and after the discussion, the patient decided to refuse the offerred treatment. The patient was informed that refusal could lead to, but was not limited to, death, permanent disability, or severe pain. If present, I asked the relatives or significant others to dissuade them without success. Prior to refusing, I determined that the patient had the capacity to make their decision and understood the consequences of that decision. After refusal, I made every reasonable opportunity to treat them to the best of my ability.  The patient was notified that they may return  to the emergency department at any time for further treatment.       [KM]    Clinical Course User Index [KM] Alveria Apley, PA-C         Clinical Impression: 1. Generalized abdominal pain     Disposition: signed out AMA  This note was prepared with assistance of Dragon voice recognition software. Occasional wrong-word or sound-a-like substitutions may have occurred due to the inherent limitations of voice recognition software.   Final Clinical Impressions(s) / ED Diagnoses   Final diagnoses:  Generalized abdominal pain    ED Discharge Orders    None       Kristine Royal 03/28/19 1133    Malvin Johns, MD 03/28/19 1157

## 2019-04-22 ENCOUNTER — Encounter: Payer: Self-pay | Admitting: Emergency Medicine

## 2019-04-22 ENCOUNTER — Other Ambulatory Visit: Payer: Self-pay

## 2019-04-22 ENCOUNTER — Emergency Department
Admission: EM | Admit: 2019-04-22 | Discharge: 2019-04-22 | Disposition: A | Discharge status: Home / Self Care | Payer: BC Managed Care – PPO | Attending: Emergency Medicine | Admitting: Emergency Medicine

## 2019-04-22 DIAGNOSIS — F17210 Nicotine dependence, cigarettes, uncomplicated: Secondary | ICD-10-CM | POA: Insufficient documentation

## 2019-04-22 DIAGNOSIS — A549 Gonococcal infection, unspecified: Secondary | ICD-10-CM | POA: Diagnosis not present

## 2019-04-22 DIAGNOSIS — Z79899 Other long term (current) drug therapy: Secondary | ICD-10-CM | POA: Insufficient documentation

## 2019-04-22 DIAGNOSIS — R369 Urethral discharge, unspecified: Secondary | ICD-10-CM | POA: Diagnosis present

## 2019-04-22 LAB — URINALYSIS, COMPLETE (UACMP) WITH MICROSCOPIC
Bacteria, UA: NONE SEEN
Bilirubin Urine: NEGATIVE
Glucose, UA: NEGATIVE mg/dL
Hgb urine dipstick: NEGATIVE
Ketones, ur: NEGATIVE mg/dL
Nitrite: NEGATIVE
Protein, ur: NEGATIVE mg/dL
Specific Gravity, Urine: 1.018 (ref 1.005–1.030)
Squamous Epithelial / HPF: NONE SEEN (ref 0–5)
WBC, UA: >50 WBC/hpf — ABNORMAL HIGH (ref 0–5)
pH: 5 (ref 5.0–8.0)

## 2019-04-22 LAB — CHLAMYDIA/NGC RT PCR (ARMC ONLY)
Chlamydia Tr: NOT DETECTED
N gonorrhoeae: DETECTED — AB

## 2019-04-22 MED ORDER — LIDOCAINE HCL (PF) 1 % IJ SOLN: 5.0000 mL | Freq: Once | INTRAMUSCULAR | Status: AC

## 2019-04-22 MED ORDER — AZITHROMYCIN 500 MG PO TABS
1000.0000 mg | ORAL_TABLET | Freq: Once | ORAL | Status: AC
  Administered 2019-04-22: 1000 mg via ORAL
  Filled 2019-04-22: qty 2

## 2019-04-22 MED ORDER — CEFTRIAXONE SODIUM 250 MG IJ SOLR
250.0000 mg | Freq: Once | INTRAMUSCULAR | Status: AC
  Administered 2019-04-22: 250 mg via INTRAMUSCULAR
  Filled 2019-04-22: qty 250

## 2019-04-22 MED ORDER — LIDOCAINE HCL (PF) 1 % IJ SOLN
INTRAMUSCULAR | Status: AC
  Administered 2019-04-22: 5 mL
  Filled 2019-04-22: qty 5

## 2019-04-22 NOTE — ED Notes (Signed)
See triage note  Presents with pain to penis and groin area   Also has a penile discharge

## 2019-04-22 NOTE — ED Provider Notes (Signed)
[facility] Emergency Department Provider Note   ____________________________________________   First MD Initiated Contact with Patient 04/22/19 [phone]     (approximate)  I have reviewed the triage vital signs and the nursing notes.   HISTORY  Chief Complaint Penile Discharge    HPI [name] is a 23 y.o. male patient presents with penile discharge this morning.  Patient last unprotected sexual contact was couple weeks ago.  Patient also complained of dysuria.         Past Medical History:  Diagnosis Date  . ADD (attention deficit disorder)   . 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 Anemia   . Anxiety   . Bipolar disorder ([place])   . Borderline personality disorder ([place])   . Central auditory processing disorder   . Deliberate self-cutting   . Depressed   . Eczema   . Headache(784.0)   . Oppositional defiant disorder   . Schizophrenia [facility])     Patient Active Problem List   Diagnosis Date Noted  . Schizophrenia ([place]) 02/24/2019  . Schizoaffective disorder ([place]) 02/22/2019  . Suicidal ideations   . Substance induced mood disorder ([place]) 03/10/2018  . Paranoid schizophrenia ([place])   . Cannabis use disorder, moderate, dependence ([place]) 06/05/2016  . Eating disorder Unspecified 05/22/2016  . Borderline personality disorder ([place]) 05/16/2016  . Tobacco use disorder 05/15/2016    Past Surgical History:  Procedure Laterality Date  . BACK SURGERY    . FRACTURE SURGERY    . NO PAST SURGERIES    . POSTERIOR LUMBAR FUSION N/A 02/19/2015   Procedure: LATERAL L-2 CORPECTOMY;  Surgeon: [name], MD;  Location: [place];  Service: Neurosurgery;  Laterality: N/A;  . POSTERIOR LUMBAR FUSION 4 LEVEL  02/19/2015   Procedure: Posterior T-12 - L-4 STABILIZATION OF POSTERIOR LUMBAR;  Surgeon: [name], MD;  Location: [place];  Service: Neurosurgery;;  . TIBIA IM NAIL INSERTION Left 02/20/2015   Procedure: INTRAMEDULLARY (IM) NAIL LEFT TIBIAL;  Surgeon: Rod Can, MD;  Location: [place];  Service: Orthopedics;  Laterality: Left;    Prior to Admission medications   Medication Sig Start Date End Date Taking? Authorizing Provider  benztropine (COGENTIN) 1 MG tablet Take 1 mg by mouth 2 (two) times daily. 02/08/19   [provider]  diphenhydrAMINE (BENADRYL) 50 MG capsule 1 in am 1 at hs Patient taking differently: Take 50 mg by mouth 2 (two) times daily.  03/09/19   Johnn Hai, MD  gabapentin (NEURONTIN) 300 MG capsule Take 300 mg by mouth 3 (three) times daily.    [provider]  haloperidol decanoate (HALDOL DECANOATE) 50 MG/ML injection Inject 4 mLs (200 mg total) into the muscle every 28 (twenty-eight) days. 03/09/19   Johnn Hai, MD  lithium carbonate (ESKALITH) 450 MG CR tablet 1 in am 1 at h s Patient taking differently: Take 450 mg by mouth 2 (two) times daily.  03/09/19   Johnn Hai, MD  traZODone (DESYREL) 150 MG tablet Take 1 tablet (150 mg total) by mouth at bedtime. 03/09/19   Johnn Hai, MD    Allergies Clozapine, Prolixin [fluphenazine], and Risperdal [risperidone]  No family history on file.  Social History Social History   Tobacco Use  . Smoking status: Current Every Day Smoker    Packs/day: 2.00    Types: Cigarettes  . Smokeless tobacco: Never Used  . Tobacco comment: 1-3 packs   Substance Use Topics  . Alcohol use: Yes  Alcohol/week: 2.0 standard drinks    Types: 2 Shots of liquor per week  . Drug use: Yes    Types: Cocaine, Marijuana    Comment: heroin    Review of Systems Constitutional: No fever/chills Eyes: No visual changes. ENT: No sore throat. Cardiovascular: Denies chest pain. Respiratory: Denies shortness of breath. Gastrointestinal: No abdominal pain.  No nausea, no vomiting.  No diarrhea.  No constipation. Genitourinary: Positive for dysuria. Musculoskeletal: Negative for back pain. Skin: Negative for  rash. Neurological: Negative for headaches, focal weakness or numbness. Psychiatric:  ADD, anxiety, bipolar, and borderline personality disorder.  Schizophrenia. Endocrine:   Allergic/Immunilogical: See medication list. ____________________________________________   PHYSICAL EXAM:  VITAL SIGNS: ED Triage Vitals  Enc Vitals Group     BP 04/22/19 0313 119/68     Pulse Rate 04/22/19 0313 62     Resp 04/22/19 0313 17     Temp 04/22/19 0313 98.7 F (37.1 C)     Temp Source 04/22/19 0313 Oral     SpO2 04/22/19 0313 100 %     Weight 04/22/19 0308 145 lb (65.8 kg)     Height 04/22/19 0308 5\' 8"  (1.727 m)     Head Circumference --      Peak Flow --      Pain Score 04/22/19 0311 0     Pain Loc --      Pain Edu? --      Excl. in GC? --     Constitutional: Alert and oriented. Well appearing and in no acute distress. Cardiovascular: Normal rate, regular rhythm. Grossly normal heart sounds.  Good peripheral circulation. Respiratory: Normal respiratory effort.  No retractions. Lungs CTAB. Genitourinary: No obvious lesion.  Yellow-green urethral discharge. Skin:  Skin is warm, dry and intact. No rash noted. Psychiatric: Mood and affect are normal. Speech and behavior are normal.  ____________________________________________   LABS (all labs ordered are listed, but only abnormal results are displayed)  Labs Reviewed  CHLAMYDIA/NGC RT PCR ([facility] ONLY) - Abnormal; Notable for the following components:      Result Value   N gonorrhoeae DETECTED (*)    All other components within normal limits  URINALYSIS, COMPLETE (UACMP) WITH MICROSCOPIC - Abnormal; Notable for the following components:   Color, Urine YELLOW (*)    APPearance HAZY (*)    Leukocytes,Ua LARGE (*)    WBC, UA >50 (*)    All other components within normal limits   ____________________________________________  EKG   ____________________________________________  RADIOLOGY  ED MD interpretation:   Official radiology report(s): No results found.  ____________________________________________   PROCEDURES  Procedure(s) performed (including Critical Care):  Procedures   ____________________________________________   INITIAL IMPRESSION / ASSESSMENT AND PLAN / ED COURSE  As part of my medical decision making, I reviewed the following data within the electronic MEDICAL RECORD NUMBER     Patient presented urethral discharge and tested positive for gonorrhea.  Patient given Rocephin and Zithromax prior to discharge.  Patient advised follow-up the [facility] department.  And notify sexual partner of his diagnosis.    [name] was evaluated in Emergency Department on 04/22/2019 for the symptoms described in the history of present illness. He was evaluated in the context of the global COVID-19 pandemic, which necessitated consideration that the patient might be at risk for infection with the SARS-CoV-2 virus that causes COVID-19. Institutional protocols and algorithms that pertain to the evaluation of patients at risk for COVID-19 are in a state of rapid  change based on information released by regulatory bodies including the CDC and federal and state organizations. These policies and algorithms were followed during the patient's care in the ED.       ____________________________________________   FINAL CLINICAL IMPRESSION(S) / ED DIAGNOSES  Final diagnoses:  Gonorrhea     ED Discharge Orders    None       Note:  This document was prepared using Dragon voice recognition software and may include unintentional dictation errors.    Joni ReiningSmith, Colbi Schiltz K, PA-C 04/22/19 [id number]    Shaune PollackIsaacs, [place], MD 04/23/19 [phone]

## 2019-04-22 NOTE — ED Notes (Signed)
Unable to obtain urine, Pt is drinking water to help.

## 2019-04-22 NOTE — ED Triage Notes (Signed)
Patient ambulatory to triage with steady gait, without difficulty or distress noted, mask in place; pt brought in by EMS from home; st pt awoke at 2am with penile discharge

## 2019-04-27 ENCOUNTER — Other Ambulatory Visit: Payer: Self-pay | Admitting: Behavioral Health

## 2019-04-27 ENCOUNTER — Encounter: Payer: Self-pay | Admitting: Emergency Medicine

## 2019-04-27 ENCOUNTER — Other Ambulatory Visit: Payer: Self-pay

## 2019-04-27 ENCOUNTER — Inpatient Hospital Stay (HOSPITAL_COMMUNITY)
Admission: AD | Admit: 2019-04-27 | Discharge: 2019-05-04 | DRG: 885 | Disposition: A | Discharge status: Home / Self Care | Payer: BC Managed Care – PPO | Attending: Psychiatry | Admitting: Psychiatry

## 2019-04-27 ENCOUNTER — Emergency Department
Admission: EM | Admit: 2019-04-27 | Discharge: 2019-04-27 | Disposition: A | Discharge status: Other Acute Inpatient Hospital | Payer: BC Managed Care – PPO | Attending: Student | Admitting: Student

## 2019-04-27 ENCOUNTER — Encounter (HOSPITAL_COMMUNITY): Payer: Self-pay | Admitting: Psychiatry

## 2019-04-27 DIAGNOSIS — Z79899 Other long term (current) drug therapy: Secondary | ICD-10-CM | POA: Diagnosis not present

## 2019-04-27 DIAGNOSIS — F509 Eating disorder, unspecified: Secondary | ICD-10-CM | POA: Diagnosis present

## 2019-04-27 DIAGNOSIS — Z20828 Contact with and (suspected) exposure to other viral communicable diseases: Secondary | ICD-10-CM | POA: Insufficient documentation

## 2019-04-27 DIAGNOSIS — F209 Schizophrenia, unspecified: Secondary | ICD-10-CM | POA: Diagnosis present

## 2019-04-27 DIAGNOSIS — Z8659 Personal history of other mental and behavioral disorders: Secondary | ICD-10-CM

## 2019-04-27 DIAGNOSIS — Z9114 Patient's other noncompliance with medication regimen: Secondary | ICD-10-CM | POA: Diagnosis not present

## 2019-04-27 DIAGNOSIS — F319 Bipolar disorder, unspecified: Secondary | ICD-10-CM | POA: Insufficient documentation

## 2019-04-27 DIAGNOSIS — F17210 Nicotine dependence, cigarettes, uncomplicated: Secondary | ICD-10-CM | POA: Insufficient documentation

## 2019-04-27 DIAGNOSIS — R45851 Suicidal ideations: Secondary | ICD-10-CM | POA: Diagnosis present

## 2019-04-27 DIAGNOSIS — Z9119 Patient's noncompliance with other medical treatment and regimen: Secondary | ICD-10-CM

## 2019-04-27 DIAGNOSIS — F200 Paranoid schizophrenia: Secondary | ICD-10-CM

## 2019-04-27 DIAGNOSIS — F309 Manic episode, unspecified: Secondary | ICD-10-CM

## 2019-04-27 LAB — COMPREHENSIVE METABOLIC PANEL
ALT: 20 U/L (ref 0–44)
AST: 27 U/L (ref 15–41)
Albumin: 4.8 g/dL (ref 3.5–5.0)
Alkaline Phosphatase: 92 U/L (ref 38–126)
Anion gap: 12 (ref 5–15)
BUN: 7 mg/dL (ref 6–20)
CO2: 28 mmol/L (ref 22–32)
Calcium: 9.7 mg/dL (ref 8.9–10.3)
Chloride: 98 mmol/L (ref 98–111)
Creatinine, Ser: 0.9 mg/dL (ref 0.61–1.24)
GFR calc Af Amer: >60 mL/min (ref >60)
GFR calc non Af Amer: >60 mL/min (ref >60)
Glucose, Bld: 84 mg/dL (ref 70–99)
Potassium: 3.5 mmol/L (ref 3.5–5.1)
Sodium: 138 mmol/L (ref 135–145)
Total Bilirubin: 0.6 mg/dL (ref 0.3–1.2)
Total Protein: 8.5 g/dL — ABNORMAL HIGH (ref 6.5–8.1)

## 2019-04-27 LAB — RESPIRATORY PANEL BY RT PCR (FLU A&B, COVID)
Influenza A by PCR: NEGATIVE
Influenza B by PCR: NEGATIVE
SARS Coronavirus 2 by RT PCR: NEGATIVE

## 2019-04-27 LAB — CBC
HCT: 49.8 % (ref 39.0–52.0)
Hemoglobin: 16.3 g/dL (ref 13.0–17.0)
MCH: 27.3 pg (ref 26.0–34.0)
MCHC: 32.7 g/dL (ref 30.0–36.0)
MCV: 83.6 fL (ref 80.0–100.0)
Platelets: 238 10*3/uL (ref 150–400)
RBC: 5.96 MIL/uL — ABNORMAL HIGH (ref 4.22–5.81)
RDW: 12.8 % (ref 11.5–15.5)
WBC: 4.8 10*3/uL (ref 4.0–10.5)
nRBC: 0 % (ref 0.0–0.2)

## 2019-04-27 LAB — ACETAMINOPHEN LEVEL: Acetaminophen (Tylenol), Serum: <10 ug/mL — ABNORMAL LOW (ref 10–30)

## 2019-04-27 LAB — ETHANOL: Alcohol, Ethyl (B): <10 mg/dL (ref <10)

## 2019-04-27 LAB — SALICYLATE LEVEL: Salicylate Lvl: <7 mg/dL (ref 2.8–30.0)

## 2019-04-27 MED ORDER — CYCLOBENZAPRINE HCL 5 MG PO TABS: 5.0000 mg | ORAL_TABLET | Freq: Three times a day (TID) | ORAL | Status: DC | PRN

## 2019-04-27 MED ORDER — ONDANSETRON 4 MG PO TBDP: 4.0000 mg | ORAL_TABLET | Freq: Three times a day (TID) | ORAL | Status: DC | PRN

## 2019-04-27 MED ORDER — LORAZEPAM 1 MG PO TABS: 1.0000 mg | ORAL_TABLET | ORAL | Status: DC | PRN

## 2019-04-27 MED ORDER — TRAZODONE HCL 50 MG PO TABS
50.0000 mg | ORAL_TABLET | Freq: Every evening | ORAL | Status: DC | PRN
  Administered 2019-04-29 – 2019-04-30 (×2): 50 mg via ORAL
  Filled 2019-04-27 (×4): qty 1

## 2019-04-27 MED ORDER — ZIPRASIDONE MESYLATE 20 MG IM SOLR: 20.0000 mg | INTRAMUSCULAR | Status: DC | PRN

## 2019-04-27 MED ORDER — ALUM & MAG HYDROXIDE-SIMETH 200-200-20 MG/5ML PO SUSP: 30.0000 mL | ORAL | Status: DC | PRN

## 2019-04-27 MED ORDER — MAGNESIUM HYDROXIDE 400 MG/5ML PO SUSP: 30.0000 mL | Freq: Every day | ORAL | Status: DC | PRN

## 2019-04-27 MED ORDER — OLANZAPINE 10 MG PO TBDP: 10.0000 mg | ORAL_TABLET | Freq: Three times a day (TID) | ORAL | Status: DC | PRN

## 2019-04-27 MED ORDER — GABAPENTIN 100 MG PO CAPS
100.0000 mg | ORAL_CAPSULE | Freq: Three times a day (TID) | ORAL | Status: DC
  Administered 2019-04-28 – 2019-05-01 (×10): 100 mg via ORAL
  Filled 2019-04-27 (×13): qty 1

## 2019-04-27 MED ORDER — HALOPERIDOL 5 MG PO TABS
10.0000 mg | ORAL_TABLET | Freq: Two times a day (BID) | ORAL | Status: DC
  Administered 2019-04-28 – 2019-04-30 (×5): 10 mg via ORAL
  Filled 2019-04-27 (×9): qty 2

## 2019-04-27 MED ORDER — DIPHENHYDRAMINE HCL 50 MG PO CAPS
50.0000 mg | ORAL_CAPSULE | Freq: Every day | ORAL | Status: DC
  Filled 2019-04-27 (×2): qty 1

## 2019-04-27 MED ORDER — LITHIUM CARBONATE ER 450 MG PO TBCR
450.0000 mg | EXTENDED_RELEASE_TABLET | Freq: Two times a day (BID) | ORAL | Status: DC
  Administered 2019-04-28 – 2019-05-04 (×12): 450 mg via ORAL
  Filled 2019-04-27 (×19): qty 1

## 2019-04-27 MED ORDER — GABAPENTIN 300 MG PO CAPS
300.0000 mg | ORAL_CAPSULE | Freq: Three times a day (TID) | ORAL | Status: DC
  Filled 2019-04-27 (×5): qty 1

## 2019-04-27 MED ORDER — ACETAMINOPHEN 325 MG PO TABS: 650.0000 mg | ORAL_TABLET | Freq: Four times a day (QID) | ORAL | Status: DC | PRN

## 2019-04-27 NOTE — Progress Notes (Signed)
Urine cup given to pt.

## 2019-04-27 NOTE — ED Triage Notes (Signed)
Pt arrived via [employer] voluntarily for manic-depression, states he is supposed to be on Topamax for Bipolar but states he has not taken it for several months.  Pt reports he has been having thoughts of SI, states he has plan to overdose on blood pressure medicine.  Pt states he does not take BP meds, but has ability to access it.  Pt denies any AVH.

## 2019-04-27 NOTE — ED Notes (Signed)
Pt dressed out by this RN and EDT Hildred Alamin, male [employer] officer that escorted patient also present while pt changed out.  PT belongings in 1 bag  1 pair boxer briefs, 2 keys on lanyard, cellphone, headphones, black socks, black nike slides, black jacket, black head piece, gray sweatpants  Pt provided surgical mask on arrival.

## 2019-04-27 NOTE — ED Provider Notes (Addendum)
[facility] Emergency Department Provider Note  ____________________________________________   First MD Initiated Contact with Patient 04/27/19 0225     (approximate)  I have reviewed the triage vital signs and the nursing notes.   HISTORY  Chief Complaint Depression and Suicidal    HPI [name] is a 23 y.o. male with psychiatric/medical history as listed below (he has a number of psychiatric diagnoses listed) who presents voluntarily with the [facility] [employer] for evaluation of what he describes as "I am having a manic episode."  He says that he is suicidal and having thoughts of killing himself by overdosing on blood pressure medicine which he does not take but has access to.  He says the symptoms started within the last 24 hours.  He says he has not been able to sleep tonight and his symptoms are severe.  Nothing helps and nothing makes them better.  He denies any medical complaints including sore throat, chest pain, cough, shortness of breath, nausea, vomiting, and abdominal pain.   Of note he says he is supposed to be taking Topamax but stopped doing so a long time ago.        Past Medical History:  Diagnosis Date  . ADD (attention deficit disorder)   . 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 Anemia   . Anxiety   . Bipolar disorder ([place])   . Borderline personality disorder ([place])   . Central auditory processing disorder   . Deliberate self-cutting   . Depressed   . Eczema   . Headache(784.0)   . Oppositional defiant disorder   . Schizophrenia [facility])     Patient Active Problem List   Diagnosis Date Noted  . Schizophrenia ([place]) 02/24/2019  . Schizoaffective disorder ([place]) 02/22/2019  . Suicidal ideations   . Substance induced mood disorder ([place]) 03/10/2018  . Paranoid schizophrenia ([place])   . Cannabis use disorder, moderate, dependence ([place]) 06/05/2016  . Eating disorder  Unspecified 05/22/2016  . Borderline personality disorder ([place]) 05/16/2016  . Tobacco use disorder 05/15/2016    Past Surgical History:  Procedure Laterality Date  . BACK SURGERY    . FRACTURE SURGERY    . NO PAST SURGERIES    . POSTERIOR LUMBAR FUSION N/A 02/19/2015   Procedure: LATERAL L-2 CORPECTOMY;  Surgeon: [name], MD;  Location: [place];  Service: Neurosurgery;  Laterality: N/A;  . POSTERIOR LUMBAR FUSION 4 LEVEL  02/19/2015   Procedure: Posterior T-12 - L-4 STABILIZATION OF POSTERIOR LUMBAR;  Surgeon: [name], MD;  Location: [place];  Service: Neurosurgery;;  . TIBIA IM NAIL INSERTION Left 02/20/2015   Procedure: INTRAMEDULLARY (IM) NAIL LEFT TIBIAL;  Surgeon: Rod Can, MD;  Location: [place];  Service: Orthopedics;  Laterality: Left;    Prior to Admission medications   Medication Sig Start Date End Date Taking? Authorizing Provider  benztropine (COGENTIN) 1 MG tablet Take 1 mg by mouth 2 (two) times daily. 02/08/19   [provider]  diphenhydrAMINE (BENADRYL) 50 MG capsule 1 in am 1 at hs Patient taking differently: Take 50 mg by mouth 2 (two) times daily.  03/09/19   Johnn Hai, MD  gabapentin (NEURONTIN) 300 MG capsule Take 300 mg by mouth 3 (three) times daily.    [provider]  haloperidol decanoate (HALDOL DECANOATE) 50 MG/ML injection Inject 4 mLs (200 mg total) into the muscle every 28 (twenty-eight) days. 03/09/19   Johnn Hai, MD  lithium carbonate (ESKALITH) 450 MG CR  tablet 1 in am 1 at h s Patient taking differently: Take 450 mg by mouth 2 (two) times daily.  03/09/19   Malvin Johns, MD  traZODone (DESYREL) 150 MG tablet Take 1 tablet (150 mg total) by mouth at bedtime. 03/09/19   Malvin Johns, MD    Allergies Clozapine, Prolixin [fluphenazine], and Risperdal [risperidone]  No family history on file.  Social History Social History   Tobacco Use  . Smoking status: Current Every Day Smoker    Packs/day: 2.00     Types: Cigarettes  . Smokeless tobacco: Never Used  . Tobacco comment: 1-3 packs   Substance Use Topics  . Alcohol use: Yes    Alcohol/week: 2.0 standard drinks    Types: 2 Shots of liquor per week  . Drug use: Yes    Types: Cocaine, Marijuana    Comment: heroin    Review of Systems Constitutional: No fever/chills Eyes: No visual changes. ENT: No sore throat. Cardiovascular: Denies chest pain. Respiratory: Denies shortness of breath. Gastrointestinal: No abdominal pain.  No nausea, no vomiting.  No diarrhea.  No constipation. Genitourinary: Negative for dysuria. Musculoskeletal: Negative for neck pain.  Negative for back pain. Integumentary: Negative for rash. Neurological: Negative for headaches, focal weakness or numbness. Psych: "Manic episode", unable to sleep, suicidal ideation with a plan (overdose on medications)  ____________________________________________   PHYSICAL EXAM:  VITAL SIGNS: ED Triage Vitals  Enc Vitals Group     BP 04/27/19 0157 138/74     Pulse Rate 04/27/19 0157 (!) 54     Resp 04/27/19 0157 18     Temp 04/27/19 0157 98.7 F (37.1 C)     Temp Source 04/27/19 0157 Oral     SpO2 04/27/19 0157 96 %     Weight 04/27/19 0158 65.8 kg (145 lb)     Height 04/27/19 0158 1.727 m (5\' 8" )     Head Circumference --      Peak Flow --      Pain Score 04/27/19 0158 0     Pain Loc --      Pain Edu? --      Excl. in GC? --     Constitutional: Alert and oriented.  Very calm.  No acute distress. Eyes: Conjunctivae are normal.  Head: Atraumatic. Nose: No congestion/rhinnorhea. Mouth/Throat: Patient is wearing a mask. Neck: No stridor.  No meningeal signs.   Cardiovascular: Normal rate, regular rhythm. Good peripheral circulation. Grossly normal heart sounds. Respiratory: Normal respiratory effort.  No retractions. Gastrointestinal: Soft and nontender. No distention.  Musculoskeletal: No lower extremity tenderness nor edema. No gross deformities of  extremities. Neurologic:  Normal speech and language. No gross focal neurologic deficits are appreciated.  Skin:  Skin is warm, dry and intact. Psychiatric: Mood and affect are flat and blunted.  Admits to suicidal ideation.  ____________________________________________   LABS (all labs ordered are listed, but only abnormal results are displayed)  Labs Reviewed  COMPREHENSIVE METABOLIC PANEL - Abnormal; Notable for the following components:      Result Value   Total Protein 8.5 (*)    All other components within normal limits  ACETAMINOPHEN LEVEL - Abnormal; Notable for the following components:   Acetaminophen (Tylenol), Serum <10 (*)    All other components within normal limits  CBC - Abnormal; Notable for the following components:   RBC 5.96 (*)    All other components within normal limits  RESPIRATORY PANEL BY RT PCR (FLU A&B, COVID)  ETHANOL  SALICYLATE LEVEL  URINE DRUG SCREEN, QUALITATIVE ([facility] ONLY)   ____________________________________________  EKG  None - EKG not ordered by ED physician ____________________________________________  RADIOLOGY Marylou MccoyI, Carianne Taira, personally viewed and evaluated these images (plain radiographs) as part of my medical decision making, as well as reviewing the written report by the radiologist.  ED MD interpretation: No indication for imaging  Official radiology report(s): No results found.  ____________________________________________   PROCEDURES   Procedure(s) performed (including Critical Care):  Procedures   ____________________________________________   INITIAL IMPRESSION / MDM / ASSESSMENT AND PLAN / ED COURSE  As part of my medical decision making, I reviewed the following data within the electronic MEDICAL RECORD NUMBER Nursing notes reviewed and incorporated, Labs reviewed , A consult was requested and obtained from this/these consultant(s) Psychiatry, Notes from prior ED visits and [place] Controlled Substance Database    Differential diagnosis includes, but is not limited to, bipolar disorder, adjustment disorder, schizophrenia, depression.  Patient's vital signs are stable, he has no medical complaints, labs are within normal limits except for urine which is not yet been collected.  Patient has a history of extensive psychiatric illness and [facility]  I have ordered psychiatry consult and TTS consult.  The patient is here voluntarily so I am not putting him under commitment at this time.      Clinical Course as of Apr 26 730  Tue Apr 27, 2019  [id number] Patient is agitated about the wait and is starting to talk about leaving.  Given that he has been endorsing suicidal ideation with a specific plan and he has a long history of mental illness, I am placing him under involuntary commitment.   [CF]  [id number] Awaiting psychiatry and TTS evaluation.   [CF]  [id number] Patient still somnolent and requiring O2.  Transferring ED care to Dr. Colon BranchMonks to reassess when patient clinically sober.   [CF]    Clinical Course User Index [CF] Loleta RoseForbach, Chuong Casebeer, MD     ____________________________________________  FINAL CLINICAL IMPRESSION(S) / ED DIAGNOSES  Final diagnoses:  None     MEDICATIONS GIVEN DURING THIS VISIT:  Medications - No data to display   ED Discharge Orders    None      *Please note:  Jason FilaMalcolm Legrand Robertson was evaluated in Emergency Department on 04/27/2019 for the symptoms described in the history of present illness. He was evaluated in the context of the global COVID-19 pandemic, which necessitated consideration that the patient might be at risk for infection with the SARS-CoV-2 virus that causes COVID-19. Institutional protocols and algorithms that pertain to the evaluation of patients at risk for COVID-19 are in a state of rapid change based on information released by regulatory bodies including the CDC and federal and state organizations. These  policies and algorithms were followed during the patient's care in the ED.  Some ED evaluations and interventions may be delayed as a result of limited staffing during the pandemic.*  Note:  This document was prepared using Dragon voice recognition software and may include unintentional dictation errors.   Loleta RoseForbach, Lennex Pietila, MD 04/27/19 [id number]    Loleta RoseForbach, Bartley Vuolo, MD 04/27/19 [phone]

## 2019-04-27 NOTE — ED Notes (Signed)
Pt. Transferred to [facility] from ED to room 3 after screening for contraband. Report to include Situation, Background, Assessment and Recommendations from [place]. Pt. Oriented to unit including Q15 minute rounds as well as the security cameras for their protection. Patient is alert and oriented, warm and dry in no acute distress. Patient denies HI, and AVH. Unsure about SI. Pt. Encouraged to let me know if needs arise.
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
## 2019-04-27 NOTE — Progress Notes (Signed)
Psychoeducational Group Note  Date:  04/27/2019 Time:  2046  Group Topic/Focus:  Wrap-Up Group:   The focus of this group is to help patients review their daily goal of treatment and discuss progress on daily workbooks.  Participation Level: Did Not Attend  Participation Quality:  Not Applicable  Affect:  Not Applicable  Cognitive:  Not Applicable  Insight:  Not Applicable  Engagement in Group: Not Applicable  Additional Comments:  The patient did not attend group this evening.  [name] S 04/27/2019, 8:46 PM

## 2019-04-27 NOTE — ED Notes (Signed)
Writer went to speak and introduce self to the patient, patient was very rude to Probation officer. Patient was standing outside his room door, writer asked patient if everything was ok or if he needed anything, he said "what business is that of yours" I informed him that I was the nurse and if he needed anything I will let the doctor know. Patient then stopped talking, but patient is talking to officer Church and telling her he was manic and bipolar and needs to talk with the doctor.  Patient sitting in the dayroom

## 2019-04-27 NOTE — ED Notes (Signed)
Patient refused breakfast 

## 2019-04-27 NOTE — ED Notes (Signed)
Hourly rounding reveals patient in room. No complaints, stable, in no acute distress. Q15 minute rounds and monitoring via Rover and Officer to continue.   
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
## 2019-04-27 NOTE — ED Notes (Signed)
SHERIFF  DEPT  CALLED FOR  TRANSPORT  TO  [facility] 

## 2019-04-27 NOTE — BH Assessment (Addendum)
Patient has been accepted to [facility].  Patient assigned to room 302-2 Accepting physician is Dr. Mallie Darting.  Call report to [phone].  Representative was [employer].   ER Staff is aware of it:  Lattie Haw, ER Secretary  Dr. Joan Mayans, ER MD  Donneta Romberg, Patient's Nurse     Attempted to contact patient's legal guardian [name]: [phone]) - No answer ... HIPAA compliant voicemail left with call back number [phone].

## 2019-04-27 NOTE — BH Assessment (Addendum)
Assessment Note  [name] is an 23 y.o. male who presents to ED reporting suicidal thoughts with a plan to overdose on his blood pressure medications. Pt states "I just told them that so I can come in and get my medications". Pt states he has been depressed because he has been obsessed with his weight. Pt reports hx of inpatient hospitalizations after he attempted suicide in 2016 by jumping off an overpass. He shared with this Clinical research associate that he has an eating disorder and obsesses over his weight. With this being said, he is reluctant to taking certain medications to treat his Bipolar dx because he believes these medications have caused him to gain weight. Patient was not forthcoming while speaking with this Clinical research associate. He shared that he has been experiencing symptoms related to his Bipolar dx. Patient was alert and oriented x4.  Dr. Cindi Carbon was present during this assessment and has recommended patient for inpatient treatment.  Diagnosis: Bipolar Disorder, by history  Past Medical History:  Past Medical History:  Diagnosis Date  . ADD (attention deficit disorder)   . 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 Anemia   . Anxiety   . Bipolar disorder (HCC)   . Borderline personality disorder (HCC)   . Central auditory processing disorder   . Deliberate self-cutting   . Depressed   . Eczema   . Headache(784.0)   . Oppositional defiant disorder   . Schizophrenia [facility])     Past Surgical History:  Procedure Laterality Date  . BACK SURGERY    . FRACTURE SURGERY    . NO PAST SURGERIES    . POSTERIOR LUMBAR FUSION N/A 02/19/2015   Procedure: LATERAL L-2 CORPECTOMY;  Surgeon: Lisbeth Renshaw, MD;  Location: [facility] OR;  Service: Neurosurgery;  Laterality: N/A;  . POSTERIOR LUMBAR FUSION 4 LEVEL  02/19/2015   Procedure: Posterior T-12 - L-4 STABILIZATION OF POSTERIOR LUMBAR;  Surgeon: Lisbeth Renshaw, MD;  Location: [facility] OR;  Service: Neurosurgery;;  .  TIBIA IM NAIL INSERTION Left 02/20/2015   Procedure: INTRAMEDULLARY (IM) NAIL LEFT TIBIAL;  Surgeon: [name], MD;  Location: [facility] OR;  Service: Orthopedics;  Laterality: Left;    Family History: No family history on file.  Social History:  reports that he has been smoking cigarettes. He has been smoking about 2.00 packs per day. He has never used smokeless tobacco. He reports current alcohol use of about 2.0 standard drinks of alcohol per week. He reports current drug use. Drugs: Cocaine and Marijuana.  Additional Social History:  Alcohol / Drug Use Pain Medications: See MAR Prescriptions: See MAR Over the Counter: See MAR History of alcohol / drug use?: No history of alcohol / drug abuse  CIWA: CIWA-Ar BP: 127/67 Pulse Rate: 68 COWS:    Allergies:  Allergies  Allergen Reactions  . Clozapine Hives and Other (See Comments)    Increased blood pressure  . Prolixin [Fluphenazine] Other (See Comments)    Hallucinations  . Risperdal [Risperidone] Other (See Comments)    Unknown    Home Medications: (Not in a hospital admission)   OB/GYN Status:  No LMP for male patient.  General Assessment Data Location of Assessment: [facility] ED TTS Assessment: In system Is this a Tele or Face-to-Face Assessment?: Face-to-Face Is this an Initial Assessment or a Re-assessment for this encounter?: Initial Assessment Patient Accompanied by:: N/A Language Other than English: No Living Arrangements: Other (Comment) What gender do you identify as?: Male Marital status: Single Living Arrangements: Alone Can  pt return to current living arrangement?: No Admission Status: Involuntary Petitioner: ED Attending Is patient capable of signing voluntary admission?: No Referral Source: Self/Family/Friend Insurance type: BCBS  Medical Screening Exam ([place]) Medical Exam completed: Yes  Crisis Care Plan Living Arrangements: Alone Legal Guardian: Mother Name of Psychiatrist: [facility] Name of Therapist: [facility]  Education Status Is patient currently in school?: No Highest grade of school patient has completed: GED Is the patient employed, unemployed or receiving disability?: Unemployed  Risk to self with the past 6 months Suicidal Ideation: No Has patient been a risk to self within the past 6 months prior to admission? : No Suicidal Intent: No Has patient had any suicidal intent within the past 6 months prior to admission? : No Is patient at risk for suicide?: No Suicidal Plan?: No Has patient had any suicidal plan within the past 6 months prior to admission? : No Specify Current Suicidal Plan: During triage pt stated he lied and told ED staff he was going to OD on BP meds Access to Means: Yes Specify Access to Suicidal Means: Pt states he can access BP meds What has been your use of drugs/alcohol within the last 12 months?: UKN Previous Attempts/Gestures: Yes How many times?: 2("Twice") Other Self Harm Risks: None Reported Triggers for Past Attempts: Unknown Intentional Self Injurious Behavior: None Family Suicide History: No Recent stressful life event(s): Other (Comment)(Medication non-compliance) Persecutory voices/beliefs?: No Depression: Yes Depression Symptoms: (Pt is vague in ref to his symptoms) Substance abuse history and/or treatment for substance abuse?: No Suicide prevention information given to non-admitted patients: Not applicable  Risk to Others within the past 6 months Homicidal Ideation: No Does patient have any lifetime risk of violence toward others beyond the six months prior to admission? : No Thoughts of Harm to Others: No Current Homicidal Intent: No Current Homicidal Plan: No Access to Homicidal Means: No Identified Victim: N/A History of harm to others?: No Assessment of Violence: None Noted Violent Behavior Description: N/A Does patient have access to weapons?: No Criminal Charges Pending?:  No Does patient have a court date: No Is patient on probation?: No  Psychosis Hallucinations: None noted Delusions: None noted  Mental Status Report Appearance/Hygiene: In scrubs Eye Contact: Poor Motor Activity: Freedom of movement Speech: Slow Level of Consciousness: Alert Mood: Depressed Affect: Sad, Depressed Anxiety Level: Minimal Thought Processes: Coherent, Relevant Judgement: Unimpaired Orientation: Person, Place, Time, Situation Obsessive Compulsive Thoughts/Behaviors: None  Cognitive Functioning Concentration: Normal Memory: Recent Intact, Remote Intact Is patient IDD: No Insight: Fair Impulse Control: Fair Appetite: Poor Have you had any weight changes? : (UTA) Sleep: Unable to Assess Total Hours of Sleep: (UTA) Vegetative Symptoms: Unable to Assess  ADLScreening [facility] Assessment Services) Patient's cognitive ability adequate to safely complete daily activities?: Yes Patient able to express need for assistance with ADLs?: Yes Independently performs ADLs?: Yes (appropriate for developmental age)  Prior Inpatient Therapy Prior Inpatient Therapy: Yes Prior Therapy Dates: Several Different Dates Prior Therapy Facilty/Provider(s): [facility] Reason for Treatment: MH issues  Prior Outpatient Therapy Prior Outpatient Therapy: Yes Prior Therapy Dates: Ongoing Prior Therapy Facilty/Provider(s): [facility] Reason for Treatment: Med mang Does patient have an ACCT team?: Yes Does patient have Intensive In-House Services?  : No Does patient have [facility] services? : No Does patient have P4CC services?: No  ADL Screening (condition at time of admission) Patient's cognitive ability adequate to safely complete daily activities?: Yes Patient able to express need for assistance with ADLs?:  Yes Independently performs ADLs?: Yes (appropriate for developmental age)       Abuse/Neglect Assessment (Assessment to be complete while patient is  alone) Abuse/Neglect Assessment Can Be Completed: Yes Physical Abuse: Denies Verbal Abuse: Denies Sexual Abuse: Denies Exploitation of patient/patient's resources: Denies Self-Neglect: Denies Values / Beliefs Cultural Requests During Hospitalization: None Spiritual Requests During Hospitalization: None Consults Spiritual Care Consult Needed: No Transition of Care Team Consult Needed: No Advance Directives (For Healthcare) Does Patient Have a Medical Advance Directive?: No Would patient like information on creating a medical advance directive?: No - Patient declined       Child/Adolescent Assessment Running Away Risk: (Patient is an adult)  Disposition:  Disposition Initial Assessment Completed for this Encounter: Yes Disposition of Patient: Admit Type of inpatient treatment program: Adult Patient refused recommended treatment: No Mode of transportation if patient is discharged/movement?: N/A [facility]  On Site Evaluation by:   Reviewed with Physician:    Mamie NickSHALETA S Brighid Koch 04/27/2019 11:50 AM

## 2019-04-27 NOTE — ED Notes (Signed)
Hourly rounding reveals patient awake in room. No complaints, stable, in no acute distress. Q15 minute rounds and monitoring via Security Cameras to continue. 

## 2019-04-27 NOTE — Tx Team (Signed)
Initial Treatment Plan 04/27/2019 3:16 PM [name] OYD:[id number]    PATIENT STRESSORS: Medication change or noncompliance   PATIENT STRENGTHS: General fund of knowledge Physical Health   PATIENT IDENTIFIED PROBLEMS: "food"  Pt reports not taking medication   Concerned over wt gain                 DISCHARGE CRITERIA:  Ability to meet basic life and health needs Adequate post-discharge living arrangements Improved stabilization in mood, thinking, and/or behavior Motivation to continue treatment in a less acute level of care Need for constant or close observation no longer present Reduction of life-threatening or endangering symptoms to within safe limits Safe-care adequate arrangements made Verbal commitment to aftercare and medication compliance  PRELIMINARY DISCHARGE PLAN: Outpatient therapy Return to previous living arrangement  PATIENT/FAMILY INVOLVEMENT: This treatment plan has been presented to and reviewed with the patient, [name], and/or family member, .  The patient and family have been given the opportunity to ask questions and make suggestions.  Zachary Lukes, RN 04/27/2019, 3:16 PM

## 2019-04-27 NOTE — Progress Notes (Signed)
Pt admitted to [facility] involuntary from [place]. Pt is guarded and vague during admission. Pt has a hx of suicidal attempts. Pt says that he jumped off an overpass in 2016 in a suicide attempt requiring back surgery. He had an OD attempt in 2014. He reports using cocaine, heroin, ectasy, xanax, percocet and THC. Pt has a hx of anorexia and is fixated on having medicine without weight gain. Pt says that he went to the hospital to get Topamax because it does not cause wt gain. He has not slept in two days. Per report, pt has a legal guardian [name] [phone]).

## 2019-04-27 NOTE — ED Notes (Signed)
Hourly rounding reveals patient sleeping in room. No complaints, stable, in no acute distress. Q15 minute rounds and monitoring via Security Cameras to continue. 

## 2019-04-27 NOTE — ED Notes (Signed)
Pt. Transferred from Triage to room after dressing out and screening for contraband. Report to include Situation, Background, Assessment and Recommendations from [facility]. Pt. Oriented to Quad including Q15 minute rounds as well as Engineer, drilling for their protection. Patient is alert and oriented, warm and dry in no acute distress. Patient reported SI and manic episode. He denied HI, and AVH. Pt. Encouraged to let me know if needs arise.

## 2019-04-27 NOTE — ED Notes (Signed)
Patient discharged home to wife, patient received discharge papers and prescription for Celexa. Patient received belongings and verbalized he has received all of his belongings. Patient appropriate and cooperative, Denies SI/HI AVH. Vital signs taken. NAD noted.

## 2019-04-28 MED ORDER — HALOPERIDOL DECANOATE 100 MG/ML IM SOLN
200.0000 mg | Freq: Once | INTRAMUSCULAR | Status: AC
  Administered 2019-04-28: 200 mg via INTRAMUSCULAR
  Filled 2019-04-28: qty 2

## 2019-04-28 MED ORDER — TOPIRAMATE 25 MG PO TABS
25.0000 mg | ORAL_TABLET | Freq: Two times a day (BID) | ORAL | Status: DC
  Administered 2019-04-28 – 2019-05-04 (×13): 25 mg via ORAL
  Filled 2019-04-28 (×18): qty 1

## 2019-04-28 NOTE — Progress Notes (Signed)
   04/28/19 2100  Psych Admission Type (Psych Patients Only)  Admission Status Involuntary  Psychosocial Assessment  Patient Complaints Suspiciousness  Eye Contact Brief  Facial Expression Flat  Affect Sad;Depressed  Speech Slow  Interaction Forwards little  Motor Activity Other (Comment) (WDL)  Appearance/Hygiene In scrubs  Behavior Characteristics Guarded;Anxious  Mood Labile;Anxious;Irritable  Thought Process  Coherency WDL  Content Blaming others  Delusions None reported or observed  Perception WDL  Hallucination None reported or observed  Judgment Poor  Confusion None  Danger to Self  Current suicidal ideation? Denies  Danger to Others  Danger to Others None reported or observed   Pt has minimal interaction on the unit this evening

## 2019-04-28 NOTE — Progress Notes (Signed)
Pt isolated to his room this evening, minimal interaction, refusing medication.

## 2019-04-28 NOTE — Progress Notes (Signed)
   04/28/19 0000  Psych Admission Type (Psych Patients Only)  Admission Status Involuntary  Psychosocial Assessment  Eye Contact Brief  Facial Expression Flat  Affect Sad;Depressed  Speech Slow  Interaction Forwards little  Motor Activity Other (Comment) (WDL)  Appearance/Hygiene In scrubs  Behavior Characteristics Guarded  Mood Labile;Irritable  Thought Process  Coherency WDL  Content Blaming others  Delusions None reported or observed  Perception WDL  Hallucination None reported or observed  Judgment Poor  Confusion None  Danger to Self  Current suicidal ideation? Denies  Danger to Others  Danger to Others None reported or observed
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
## 2019-04-28 NOTE — Progress Notes (Signed)
Recreation Therapy Notes  Date: 12.16.20 Time: 1000 Location: 500 Hall Dayroom    Group Topic: Communication, Team Building, Problem Solving  Goal Area(s) Addresses:  Patient will effectively work with peer towards shared goal.  Patient will identify skills used to make activity successful.  Patient will identify how skills used during activity can be used to reach post d/c goals.   Behavioral Response: None  Intervention: STEM Activity  Activity: Straw Bridge. In teams patients were given 15 plastic drinking straws and a length of masking tape. Using the materials provided patients were asked to build an elevated bridge that can hold a small puzzle box without collapsing.   Education: Education officer, community, Discharge Planning   Education Outcome: Acknowledges education/In group clarification offered/Needs additional education.   Clinical Observations/Feedback: Pt did not participate in group.  Pt sat at the table writing on a piece of paper.     Victorino Sparrow, LRT/CTRS    Ria Comment, Avacyn Kloosterman A 04/28/2019 11:30 AM
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
## 2019-04-28 NOTE — Tx Team (Signed)
Interdisciplinary Treatment and Diagnostic Plan Update  04/28/2019 Time of Session: 10:00am [name] MRN: [id number]  Principal Diagnosis: <principal problem not specified>  Secondary Diagnoses: Active Problems:   Schizophrenia ([place])   Bipolar 1 disorder ([place])   Current Medications:  Current Facility-Administered Medications  Medication Dose Route Frequency Provider Last Rate Last Admin  . acetaminophen (TYLENOL) tablet 650 mg  650 mg Oral Q6H PRN Sharma Covert, MD      . alum & mag hydroxide-simeth (MAALOX/MYLANTA) 200-200-20 MG/5ML suspension 30 mL  30 mL Oral Q4H PRN Sharma Covert, MD      . cyclobenzaprine (FLEXERIL) tablet 5 mg  5 mg Oral TID PRN Sharma Covert, MD      . gabapentin (NEURONTIN) capsule 100 mg  100 mg Oral TID Sharma Covert, MD   100 mg at 04/28/19 0825  . haloperidol (HALDOL) tablet 10 mg  10 mg Oral BID Sharma Covert, MD   10 mg at 04/28/19 0825  . haloperidol decanoate (HALDOL DECANOATE) 100 MG/ML injection 200 mg  200 mg Intramuscular Once Johnn Hai, MD      . lithium carbonate (ESKALITH) CR tablet 450 mg  450 mg Oral Q12H Sharma Covert, MD   450 mg at 04/28/19 0825  . OLANZapine zydis (ZYPREXA) disintegrating tablet 10 mg  10 mg Oral Q8H PRN Sharma Covert, MD       And  . LORazepam (ATIVAN) tablet 1 mg  1 mg Oral PRN Sharma Covert, MD       And  . ziprasidone (GEODON) injection 20 mg  20 mg Intramuscular PRN Sharma Covert, MD      . magnesium hydroxide (MILK OF MAGNESIA) suspension 30 mL  30 mL Oral Daily PRN Sharma Covert, MD      . ondansetron (ZOFRAN-ODT) disintegrating tablet 4 mg  4 mg Oral Q8H PRN Sharma Covert, MD      . topiramate (TOPAMAX) tablet 25 mg  25 mg Oral BID Johnn Hai, MD      . traZODone (DESYREL) tablet 50 mg  50 mg Oral QHS PRN Sharma Covert, MD       PTA Medications: Medications Prior to Admission  Medication Sig Dispense Refill Last Dose  .  benztropine (COGENTIN) 1 MG tablet Take 1 mg by mouth 2 (two) times daily.     . diphenhydrAMINE (BENADRYL) 50 MG capsule 1 in am 1 at hs (Patient taking differently: Take 50 mg by mouth 2 (two) times daily. ) 60 capsule 11   . gabapentin (NEURONTIN) 300 MG capsule Take 300 mg by mouth 3 (three) times daily.     . haloperidol decanoate (HALDOL DECANOATE) 50 MG/ML injection Inject 4 mLs (200 mg total) into the muscle every 28 (twenty-eight) days. 4 mL 11   . lithium carbonate (ESKALITH) 450 MG CR tablet 1 in am 1 at h s (Patient taking differently: Take 450 mg by mouth 2 (two) times daily. ) 60 tablet 11   . traZODone (DESYREL) 150 MG tablet Take 1 tablet (150 mg total) by mouth at bedtime. 30 tablet 11     Patient Stressors: Medication change or noncompliance  Patient Strengths: General fund of knowledge Physical Health  Treatment Modalities: Medication Management, Group therapy, Case management,  1 to 1 session with clinician, Psychoeducation, Recreational therapy.   Physician Treatment Plan for Primary Diagnosis: <principal problem not specified> Long Term Goal(s): Improvement in symptoms so as ready for discharge Improvement in  symptoms so as ready for discharge   Short Term Goals: Ability to identify changes in lifestyle to reduce recurrence of condition will improve Ability to verbalize feelings will improve Ability to disclose and discuss suicidal ideas Ability to demonstrate self-control will improve Ability to identify and develop effective coping behaviors will improve Ability to maintain clinical measurements within normal limits will improve Ability to identify changes in lifestyle to reduce recurrence of condition will improve Ability to verbalize feelings will improve Ability to disclose and discuss suicidal ideas Ability to demonstrate self-control will improve Ability to identify and develop effective coping behaviors will improve Ability to maintain clinical  measurements within normal limits will improve Compliance with prescribed medications will improve  Medication Management: Evaluate patient's response, side effects, and tolerance of medication regimen.  Therapeutic Interventions: 1 to 1 sessions, Unit Group sessions and Medication administration.  Evaluation of Outcomes: Not Met  Physician Treatment Plan for Secondary Diagnosis: Active Problems:   Schizophrenia ([place])   Bipolar 1 disorder ([place])  Long Term Goal(s): Improvement in symptoms so as ready for discharge Improvement in symptoms so as ready for discharge   Short Term Goals: Ability to identify changes in lifestyle to reduce recurrence of condition will improve Ability to verbalize feelings will improve Ability to disclose and discuss suicidal ideas Ability to demonstrate self-control will improve Ability to identify and develop effective coping behaviors will improve Ability to maintain clinical measurements within normal limits will improve Ability to identify changes in lifestyle to reduce recurrence of condition will improve Ability to verbalize feelings will improve Ability to disclose and discuss suicidal ideas Ability to demonstrate self-control will improve Ability to identify and develop effective coping behaviors will improve Ability to maintain clinical measurements within normal limits will improve Compliance with prescribed medications will improve     Medication Management: Evaluate patient's response, side effects, and tolerance of medication regimen.  Therapeutic Interventions: 1 to 1 sessions, Unit Group sessions and Medication administration.  Evaluation of Outcomes: Not Met   RN Treatment Plan for Primary Diagnosis: <principal problem not specified> Long Term Goal(s): Knowledge of disease and therapeutic regimen to maintain health will improve  Short Term Goals: Ability to demonstrate self-control, Ability to participate in decision making will  improve, Ability to verbalize feelings will improve and Ability to identify and develop effective coping behaviors will improve  Medication Management: RN will administer medications as ordered by provider, will assess and evaluate patient's response and provide education to patient for prescribed medication. RN will report any adverse and/or side effects to prescribing provider.  Therapeutic Interventions: 1 on 1 counseling sessions, Psychoeducation, Medication administration, Evaluate responses to treatment, Monitor vital signs and CBGs as ordered, Perform/monitor CIWA, COWS, AIMS and Fall Risk screenings as ordered, Perform wound care treatments as ordered.  Evaluation of Outcomes: Not Met   LCSW Treatment Plan for Primary Diagnosis: <principal problem not specified> Long Term Goal(s): Safe transition to appropriate next level of care at discharge, Engage patient in therapeutic group addressing interpersonal concerns.  Short Term Goals: Engage patient in aftercare planning with referrals and resources, Increase social support, Increase emotional regulation, Identify triggers associated with mental health/substance abuse issues and Increase skills for wellness and recovery  Therapeutic Interventions: Assess for all discharge needs, 1 to 1 time with Social worker, Explore available resources and support systems, Assess for adequacy in community support network, Educate family and significant other(s) on suicide prevention, Complete Psychosocial Assessment, Interpersonal group therapy.  Evaluation of Outcomes: Not Met  Progress in Treatment: Attending groups: No. New to unit. Participating in groups: No. New to unit. Taking medication as prescribed: Yes. Toleration medication: Yes. Family/Significant other contact made: No, will contact:  legal guardian. [name] (mother). Patient understands diagnosis: Yes. Discussing patient identified problems/goals with staff: No. Medical problems  stabilized or resolved: Yes. Denies suicidal/homicidal ideation: No. Endorses SI Issues/concerns per patient self-inventory: No. Other:  New problem(s) identified: No, Describe:  CSW continuing to assess.  New Short Term/Long Term Goal(s):  Patient Goals:    Discharge Plan or Barriers:   Reason for Continuation of Hospitalization: [place] Medication stabilization Suicidal ideation  Estimated Length of Stay: 5-7 days  Attendees: Patient: 04/28/2019 10:41 AM  Physician: [name] 04/28/2019 10:41 AM  Nursing: Vladimir Faster, RN 04/28/2019 10:41 AM  RN Care Manager: 04/28/2019 10:41 AM  Social Worker: Sherren Mocha, [place] 04/28/2019 10:41 AM  Recreational Therapist:  04/28/2019 10:41 AM  Other: Stephanie Acre, [place] 04/28/2019 10:41 AM  Other:  04/28/2019 10:41 AM  Other: 04/28/2019 10:41 AM    Scribe for Treatment Team: Blane Ohara, LCSW 04/28/2019 10:41 AM

## 2019-04-28 NOTE — Progress Notes (Signed)
Recreation Therapy Notes  INPATIENT RECREATION THERAPY ASSESSMENT  Patient Details Name: [name] MRN: [id number] DOB: 04-15-1996 Today's Date: 04/28/2019       Information Obtained From: Patient  Able to Participate in Assessment/Interview: Yes  Patient Presentation: Alert  Reason for Admission (Per Patient): Other (Comments)(Pt stated med management)  Patient Stressors: (None identified)  Coping Skills:   Isolation, Journal, Music, Exercise, Meditate, Deep Breathing, Avoidance, Read  Leisure Interests (2+):  Music - Listen  Frequency of Recreation/Participation: Other (Comment)(Daily)  Awareness of Community Resources:  Yes  Community Resources:  Library  Current Use: Yes  If no, Barriers?:    Expressed Interest in [place]: No  County of Residence:  [place] under[place]  Patient Main Form of Transportation: Diplomatic Services operational officer  Patient Strengths:  Writing; Staying calm  Patient Identified Areas of Improvement:  "I don't know"  Patient Goal for Hospitalization:  "get back on meds and go home"  Current SI (including self-harm):  No  Current HI:  No  Current AVH: No  Staff Intervention Plan: Group Attendance, Collaborate with Interdisciplinary Treatment Team  Consent to Intern Participation: N/A    Victorino Sparrow, LRT/CTRS  Victorino Sparrow A 04/28/2019, 11:57 AM

## 2019-04-29 NOTE — Progress Notes (Signed)
Recreation Therapy Notes  Date: 12.17.20 Time: 0950 Location: [address] Dayroom  Group Topic: Anger Management  Goal Area(s) Addresses:  Patient will identify triggers for anger.  Patient will identify a situation that makes them angry.  Patient will identify what other emotions comes with anger.  Intervention: Worksheets  Activity:  Introduction to Anger Management.  Patients were to identify 3 situations/topics/people that lead to them being angry.  Patients would also identify what they do when angry, problems that have resulted because of anger and identify 3 positive coping skills to use when angry.  Education: Anger Management, Discharge Planning   Education Outcome: Acknowledges education/In group clarification offered/Needs additional education.   Clinical Observations/Feedback:  Pt did not attend group session.     Victorino Sparrow, LRT/CTRS         Ria Comment, Virdell Hoiland A 04/29/2019 10:26 AM

## 2019-04-29 NOTE — [facility] Suicide Risk Assessment (Signed)
[name] INPATIENT:  Family/Significant Other Suicide Prevention Education  Suicide Prevention Education:  Education Completed; Legal guardian (mother), [name], [phone] has been identified by the patient as the family member/significant other with whom the patient will be residing, and identified as the person(s) who will aid the patient in the event of a mental health crisis (suicidal ideations/suicide attempt).  With written consent from the patient, the family member/significant other has been provided the following suicide prevention education, prior to the and/or following the discharge of the patient.  The suicide prevention education provided includes the following:  Suicide risk factors  Suicide prevention and interventions  National Suicide Hotline telephone number  [facility] assessment telephone number  [facility] Emergency Assistance [place] and/or Residential Mobile Crisis Unit telephone number  Request made of family/significant other to:  Remove weapons (e.g., guns, rifles, knives), all items previously/currently identified as safety concern.    Remove drugs/medications (over-the-counter, prescriptions, illicit drugs), all items previously/currently identified as a safety concern.  The family member/significant other verbalizes understanding of the suicide prevention education information provided.  The family member/significant other agrees to remove the items of safety concern listed above.  Mother expressed frustration in not receiving communication from treatment team. The majority of her questions and concerns relate to medication management and she requested calls from psychiatry and patient's nurse.  Mother did confirm that patient has been staying in [place], she states the patient remains with his current ACTT provider as she believes their network covers [facility] as well.  No additional concerns for CSW, mother has  asked for more frequent communications and updates regarding treatment.  Joellen Jersey 04/29/2019, 9:51 AM

## 2019-04-29 NOTE — Progress Notes (Addendum)
[facility] MD Progress Note  04/29/2019 8:27 AM [name]  MRN:  [id number] Subjective:    Today [name] appears somnolent and describes his mood as "ok" while laying in bed facing the ceiling and answering my questions today. He was orient x4 but when I asked why he was here he said he did not know. He was also not aware of any psychiatric diagnosis either. He denied any memory of events that could have brought him to the facility.  He states that the medications have been "Ok"; "Ok" was his response to most of the questions I asked him today.  Again he denies current auditory or visual hallucinations but he does agree to comply with medications here.   He denies wanting to harm self or others at this point in time.  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 ADD (attention deficit disorder)   . 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 Anemia   . Anxiety   . Bipolar disorder (H[facility])   . Borderline personality disorder (H[facility])   . Central auditory processing disorder   . Deliberate self-cutting   . Depressed   . Eczema   . Headache(784.0)   . Oppositional defiant disorder   . Schizophrenia [facility])     Past Surgical History:  Procedure Laterality Date  . BACK SURGERY    . FRACTURE SURGERY    . NO PAST SURGERIES    . POSTERIOR LUMBAR FUSION N/A 02/19/2015   Procedure: LATERAL L-2 CORPECTOMY;  Surgeon: Lisbeth Renshaw, MD;  Location: [facility] OR;  Service: Neurosurgery;  Laterality: N/A;  . POSTERIOR LUMBAR FUSION 4 LEVEL  02/19/2015   Procedure: Posterior T-12 - L-4  STABILIZATION OF POSTERIOR LUMBAR;  Surgeon: Lisbeth Renshaw, MD;  Location: [facility] OR;  Service: Neurosurgery;;  . TIBIA IM NAIL INSERTION Left 02/20/2015   Procedure: INTRAMEDULLARY (IM) NAIL LEFT TIBIAL;  Surgeon: [name], MD;  Location: [facility] OR;  Service: Orthopedics;  Laterality: Left;   Family History: History reviewed. No pertinent family history. Family Psychiatric  History: Emelia Loron is an untreated psychotic Social History:  Social History   Substance and Sexual Activity  Alcohol Use Yes  . Alcohol/week: 2.0 standard drinks  . Types: 2 Shots of liquor per week     Social History   Substance and Sexual Activity  Drug Use Yes  . Types: Cocaine, Marijuana, Benzodiazepines, Heroin   Comment: heroin    Social History   Socioeconomic History  . Marital status: Single    Spouse name: Not on file  . Number of children: Not on file  . Years of education: Not on file  . Highest education level: Not on file  Occupational History  . Occupation: Unemployed  Tobacco Use  . Smoking status: Current Every Day Smoker    Packs/day: 2.00    Types: Cigarettes  . Smokeless tobacco: Never Used  . Tobacco comment: 1-3 packs   Substance and Sexual Activity  . Alcohol use: Yes    Alcohol/week: 2.0 standard drinks    Types: 2 Shots of liquor per week  . Drug use: Yes    Types: Cocaine, Marijuana, Benzodiazepines,  Heroin    Comment: heroin  . Sexual activity: Yes    Birth control/protection: None    Comment: pt reluctant to answer questions and frequently stated " I dont know"  Other Topics Concern  . Not on file  Social History Narrative   ** Merged History Encounter **    Pt reported that he is unemployed, homeless as his mother is in rehab   Social Determinants of Health   Financial Resource Strain:   . Difficulty of Paying Living Expenses: Not on file  Food Insecurity:   . Worried About Programme researcher, broadcasting/film/videounning Out of Food in the Last Year: Not on file  . Ran Out of Food in the Last  Year: Not on file  Transportation Needs:   . Lack of Transportation (Medical): Not on file  . Lack of Transportation (Non-Medical): Not on file  Physical Activity:   . Days of Exercise per Week: Not on file  . Minutes of Exercise per Session: Not on file  Stress:   . Feeling of Stress : Not on file  Social Connections:   . Frequency of Communication with Friends and Family: Not on file  . Frequency of Social Gatherings with Friends and Family: Not on file  . Attends Religious Services: Not on file  . Active Member of Clubs or Organizations: Not on file  . Attends BankerClub or Organization Meetings: Not on file  . Marital Status: Not on file   Additional Social History:                         Sleep: Fair  Appetite:  Fair  Current Medications: Current Facility-Administered Medications  Medication Dose Route Frequency Provider Last Rate Last Admin  . acetaminophen (TYLENOL) tablet 650 mg  650 mg Oral Q6H PRN Antonieta Pertlary, Greg Lawson, MD      . alum & mag hydroxide-simeth (MAALOX/MYLANTA) 200-200-20 MG/5ML suspension 30 mL  30 mL Oral Q4H PRN Antonieta Pertlary, Greg Lawson, MD      . cyclobenzaprine (FLEXERIL) tablet 5 mg  5 mg Oral TID PRN Antonieta Pertlary, Greg Lawson, MD      . gabapentin (NEURONTIN) capsule 100 mg  100 mg Oral TID Antonieta Pertlary, Greg Lawson, MD   100 mg at 04/29/19 [id number]  . haloperidol (HALDOL) tablet 10 mg  10 mg Oral BID Antonieta Pertlary, Greg Lawson, MD   10 mg at 04/29/19 [id number]  . lithium carbonate (ESKALITH) CR tablet 450 mg  450 mg Oral Q12H Antonieta Pertlary, Greg Lawson, MD   450 mg at 04/29/19 0733  . OLANZapine zydis (ZYPREXA) disintegrating tablet 10 mg  10 mg Oral Q8H PRN Antonieta Pertlary, Greg Lawson, MD       And  . LORazepam (ATIVAN) tablet 1 mg  1 mg Oral PRN Antonieta Pertlary, Greg Lawson, MD       And  . ziprasidone (GEODON) injection 20 mg  20 mg Intramuscular PRN Antonieta Pertlary, Greg Lawson, MD      . magnesium hydroxide (MILK OF MAGNESIA) suspension 30 mL  30 mL Oral Daily PRN Antonieta Pertlary, Greg Lawson, MD      . ondansetron  (ZOFRAN-ODT) disintegrating tablet 4 mg  4 mg Oral Q8H PRN Antonieta Pertlary, Greg Lawson, MD      . topiramate (TOPAMAX) tablet 25 mg  25 mg Oral BID Malvin JohnsFarah, Akilah Cureton, MD   25 mg at 04/29/19 [id number]  . traZODone (DESYREL) tablet 50 mg  50 mg Oral QHS PRN Antonieta Pertlary, Greg Lawson, MD        Lab  Results:  No results found for this or any previous visit (from the past 48 hour(s)).  Blood Alcohol level:  Lab Results  Component Value Date   ETH <10 04/27/2019   ETH <10 41/32/4401    Metabolic Disorder Labs: Lab Results  Component Value Date   HGBA1C 5.4 02/28/2019   MPG 108 02/28/2019   MPG 108 06/05/2016   Lab Results  Component Value Date   PROLACTIN 37.6 (H) 02/28/2019   PROLACTIN 32.7 (H) 02/22/2013   Lab Results  Component Value Date   CHOL 178 02/28/2019   TRIG 104 02/28/2019   HDL 60 02/28/2019   CHOLHDL 3.0 02/28/2019   VLDL 21 02/28/2019   LDLCALC 97 02/28/2019   LDLCALC 85 06/05/2016    Physical Findings: AIMS: Facial and Oral Movements Muscles of Facial Expression: None, normal Lips and Perioral Area: None, normal Jaw: None, normal Tongue: None, normal,Extremity Movements Upper (arms, wrists, hands, fingers): None, normal Lower (legs, knees, ankles, toes): None, normal, Trunk Movements Neck, shoulders, hips: None, normal, Overall Severity Severity of abnormal movements (highest score from questions above): None, normal Incapacitation due to abnormal movements: None, normal Patient's awareness of abnormal movements (rate only patient's report): No Awareness, Dental Status Current problems with teeth and/or dentures?: No Does patient usually wear dentures?: No  CIWA:    COWS:     Musculoskeletal: Strength & Muscle Tone: within normal limits Gait & Station: normal Patient leans: N/A  Psychiatric Specialty Exam: Physical Exam  Review of Systems  Blood pressure 130/68, pulse (!) 58, temperature 98.2 F (36.8 C), temperature source Oral, resp. rate 16, height 5\' 8"  (1.727 m),  weight 70.3 kg, SpO2 100 %.Body mass index is 23.57 kg/m.  General Appearance: Casual  Eye Contact:  Poor  Speech:  Slow  Volume:  Decreased  Mood:  Dysphoric and Irritable  Affect:  Congruent  Thought Process:  Linear and Descriptions of Associations: Circumstantial  Orientation:  Full (Time, Place, and Person)  Thought Content:  He denies current auditory or visual hallucinations or thoughts of self-harm  Suicidal Thoughts:  No  Homicidal Thoughts:  No  Memory:  Immediate;   Fair Recent;   Poor Remote;   Fair  Judgement:  Impaired  Insight:  Lacking  Psychomotor Activity:  Normal  Concentration:  Concentration: Fair and Attention Span: Fair  Recall:  [employer] of Knowledge:  Fair  Language:  Fair  Akathisia:  Negative  Handed:  Right  AIMS (if indicated):     Assets:  Physical Health Resilience  ADL's:  Intact  Cognition:  WNL  Sleep:  Number of Hours: 9.5     Treatment Plan Summary: Daily contact with patient to assess and evaluate symptoms and progress in treatment and Medication management  Continue reality based therapy no change in precautions, consider adjustment to topamax to reduce sedating effect. Current presentation might not be far from baseline and readjustment might not be needed.  Billey Chang, Medical Student 04/29/2019, 8:27 AM

## 2019-04-29 NOTE — Progress Notes (Signed)
   04/29/19 2100  Psych Admission Type (Psych Patients Only)  Admission Status Involuntary  Psychosocial Assessment  Patient Complaints Suspiciousness  Eye Contact Brief  Facial Expression Flat  Affect Sad;Depressed  Speech Slow  Interaction Forwards little  Motor Activity Other (Comment) (WDL)  Appearance/Hygiene In scrubs  Behavior Characteristics Guarded  Mood Labile;Anxious  Thought Process  Coherency WDL  Content Blaming others  Delusions None reported or observed  Perception WDL  Hallucination None reported or observed  Judgment Poor  Confusion None  Danger to Self  Current suicidal ideation? Denies  Danger to Others  Danger to Others None reported or observed

## 2019-04-30 MED ORDER — HALOPERIDOL 5 MG PO TABS
5.0000 mg | ORAL_TABLET | Freq: Two times a day (BID) | ORAL | Status: DC
  Administered 2019-04-30 – 2019-05-04 (×8): 5 mg via ORAL
  Filled 2019-04-30 (×12): qty 1
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
## 2019-04-30 NOTE — Progress Notes (Signed)
Recreation Therapy Notes  Date: 12.18.20 Time: 1015 Location: [address] Dayroom  Group Topic: Coping Skills  Goal Area(s) Addresses:  Patient will identify positive coping skills. Patient will identify benefit of using positive coping skills post d/c.  Intervention: Ines Bloomer  Activity: Scientist, clinical (histocompatibility and immunogenetics).  During each turn, patients were give a positive coping skill.  Patients could not repeat coping skills that had already been given.  Once the jenga tower falls over, the game would start over.  Education: Radiographer, therapeutic, Dentist.   Education Outcome: Acknowledges understanding/In group clarification offered/Needs additional education.   Clinical Observations/Feedback: Pt did not attend group session.     Victorino Sparrow, LRT/CTRS         Victorino Sparrow A 04/30/2019 11:27 AM

## 2019-04-30 NOTE — Plan of Care (Signed)
Patient Self Inventory Sheet  reports good appetite, energy normal, and fair sleeping pattern without mediaction. Pt rates his depression 0 out of 10, hopelessness 0 out of 10, and anxiety at 0 out of 10. Pt denies SI, HI,  and AVH.  Pt denies any physical problems and no pain. Vitals signs being monitored. Pt states his most important goal is to learn skills on how to stay calm.    Medication given as Rx, no adverse reaction observed, Safety maintained with 15 minute checks    Problem: Education: Goal: Knowledge of [facility] Education information/materials will improve Outcome: Progressing Goal: Emotional status will improve Outcome: Progressing Goal: Mental status will improve Outcome: Progressing Goal: Verbalization of understanding the information provided will improve Outcome: Progressing   Problem: Activity: Goal: Interest or engagement in activities will improve Outcome: Progressing Goal: Sleeping patterns will improve Outcome: Progressing   Problem: Coping: Goal: Ability to verbalize frustrations and anger appropriately will improve Outcome: Progressing Goal: Ability to demonstrate self-control will improve Outcome: Progressing   Problem: Health Behavior/Discharge Planning: Goal: Identification of resources available to assist in meeting health care needs will improve Outcome: Progressing Goal: Compliance with treatment plan for underlying cause of condition will improve Outcome: Progressing   Problem: Physical Regulation: Goal: Ability to maintain clinical measurements within normal limits will improve Outcome: Progressing   Problem: Safety: Goal: Periods of time without injury will increase Outcome: Progressing   Problem: Education: Goal: Ability to make informed decisions regarding treatment will improve Outcome: Progressing   Problem: Coping: Goal: Coping ability will improve Outcome: Progressing   Problem: Health Behavior/Discharge  Planning: Goal: Identification of resources available to assist in meeting health care needs will improve Outcome: Progressing   Problem: Medication: Goal: Compliance with prescribed medication regimen will improve Outcome: Progressing   Problem: Self-Concept: Goal: Ability to disclose and discuss suicidal ideas will improve Outcome: Progressing Goal: Will verbalize positive feelings about self Outcome: Progressing

## 2019-04-30 NOTE — [facility] Counselor (Signed)
CSW attempted to speak with Legal guardian (mother), [name] [phone]) by phone, as legal guardian asked for daily/frequent updates. The call went straight to a voicemail that was full. CSW unable to leave a message at this time.  Stephanie Acre, MSW, [place] Social Worker [name]  [phone]

## 2019-04-30 NOTE — Progress Notes (Signed)
Spiritual care group facilitated by Chaplain Jerene Pitch, MDiv, B[facility].  Group focused on topic of HOPE.  Patients engaged in facilitated dialog around topic, naming places of hope in their lives and creating a word cloud on the board.  Pt's engaged in visual explorer exercise, choosing art pieces that connect with hope for today.   [name] was invited to group.  Did not attend

## 2019-05-01 MED ORDER — GABAPENTIN 100 MG PO CAPS
100.0000 mg | ORAL_CAPSULE | Freq: Two times a day (BID) | ORAL | Status: DC
  Administered 2019-05-01 – 2019-05-04 (×6): 100 mg via ORAL
  Filled 2019-05-01 (×10): qty 1

## 2019-05-01 NOTE — Plan of Care (Signed)
  Problem: Education: Goal: Emotional status will improve Outcome: Progressing Goal: Mental status will improve Outcome: Progressing   Problem: Activity: Goal: Sleeping patterns will improve Outcome: Progressing   Problem: Health Behavior/Discharge Planning: Goal: Compliance with treatment plan for underlying cause of condition will improve Outcome: Progressing   

## 2019-05-01 NOTE — Progress Notes (Signed)
   05/01/19 0000  Psych Admission Type (Psych Patients Only)  Admission Status Involuntary  Psychosocial Assessment  Patient Complaints Suspiciousness  Eye Contact Brief  Facial Expression Flat  Affect Sad;Depressed  Speech Slow  Interaction Forwards little  Motor Activity Other (Comment) (WDL)  Appearance/Hygiene In scrubs  Behavior Characteristics Guarded  Mood Anxious  Thought Process  Coherency WDL  Content Blaming others  Delusions None reported or observed  Perception WDL  Hallucination None reported or observed  Judgment Poor  Confusion None  Danger to Self  Current suicidal ideation? Denies  Danger to Others  Danger to Others None reported or observed

## 2019-05-01 NOTE — Progress Notes (Signed)
[facility] Group Notes:  (Nursing/MHT/Case Management/Adjunct)  Date:  05/01/2019  Time:  900  Type of Therapy:  Nurse Education Discussed coping skills. Implemented painting during this session as a positive coping skill for mental health disorders.    Participation Level:  Did Not Attend   Fabio Wah L  

## 2019-05-01 NOTE — Progress Notes (Signed)
   05/01/19 1800  Psych Admission Type (Psych Patients Only)  Admission Status Involuntary  Psychosocial Assessment  Patient Complaints None  Eye Contact Brief  Facial Expression Flat  Affect Sad;Depressed  Speech Slow  Interaction Forwards little  Motor Activity Other (Comment) (WDL)  Appearance/Hygiene In scrubs  Behavior Characteristics Cooperative;Guarded  Mood Depressed;Anxious  Thought Process  Coherency WDL  Content WDL  Delusions None reported or observed  Perception WDL  Hallucination None reported or observed  Judgment Poor  Confusion None  Danger to Self  Current suicidal ideation? Denies  Danger to Others  Danger to Others None reported or observed

## 2019-05-01 NOTE — Progress Notes (Signed)
[facility] MD Progress Note  05/01/2019 10:36 AM [name]  MRN:  [id number] Subjective: Patient is a 23 year old male with a past psychiatric history significant for schizophrenia who was admitted on 12/15 secondary to suicidal thoughts and agitation.  Objective: Patient is seen and examined.  Patient is a 23 year old male with the above-stated past psychiatric history who is seen in follow-up.  He is mildly sedated today and lying in bed.  He is easily aroused, but still sedated.  Yesterday nursing notes reflected a good appetite, fair sleep, and rating his depression is 0 out of 10, hopelessness is 0 out of 10, no suicidal, homicidal or auditory or visual hallucinations.  His vital signs are stable, he is afebrile.  Nursing notes reflect he slept 10 hours last night.  Review of his laboratories on admission showed essentially normal electrolytes, essentially normal CBC, and drug screen was not obtained.  His current medications include Flexeril, gabapentin, Haldol, lithium, Topamax and trazodone.  He has had delusional thinking about eating disorder issues.  Principal Problem: <principal problem not specified> Diagnosis: Active Problems:   Schizophrenia (HCC)   Bipolar 1 disorder (HCC)  Total Time spent with patient: 20 minutes  Past Psychiatric History: See admission H&P  Past Medical History:  Past Medical History:  Diagnosis Date  . ADD (attention deficit disorder)   . 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 Anemia   . Anxiety   . Bipolar disorder (HCC)   . Borderline personality disorder (HCC)   . Central auditory processing disorder   . Deliberate self-cutting   . Depressed   . Eczema   . Headache(784.0)   . Oppositional defiant disorder   . Schizophrenia [facility])     Past Surgical History:  Procedure Laterality Date  . BACK SURGERY    . FRACTURE SURGERY    . NO PAST SURGERIES    . POSTERIOR LUMBAR FUSION N/A 02/19/2015    Procedure: LATERAL L-2 CORPECTOMY;  Surgeon: Lisbeth Renshaw, MD;  Location: [facility] OR;  Service: Neurosurgery;  Laterality: N/A;  . POSTERIOR LUMBAR FUSION 4 LEVEL  02/19/2015   Procedure: Posterior T-12 - L-4 STABILIZATION OF POSTERIOR LUMBAR;  Surgeon: Lisbeth Renshaw, MD;  Location: [facility] OR;  Service: Neurosurgery;;  . TIBIA IM NAIL INSERTION Left 02/20/2015   Procedure: INTRAMEDULLARY (IM) NAIL LEFT TIBIAL;  Surgeon: [name], MD;  Location: [facility] OR;  Service: Orthopedics;  Laterality: Left;   Family History: History reviewed. No pertinent family history. Family Psychiatric  History: See admission H&P Social History:  Social History   Substance and Sexual Activity  Alcohol Use Yes  . Alcohol/week: 2.0 standard drinks  . Types: 2 Shots of liquor per week     Social History   Substance and Sexual Activity  Drug Use Yes  . Types: Cocaine, Marijuana, Benzodiazepines, Heroin   Comment: heroin    Social History   Socioeconomic History  . Marital status: Single    Spouse name: Not on file  . Number of children: Not on file  . Years of education: Not on file  . Highest education level: Not on file  Occupational History  . Occupation: Unemployed  Tobacco Use  . Smoking status: Current Every Day Smoker    Packs/day: 2.00    Types: Cigarettes  . Smokeless tobacco: Never Used  . Tobacco comment: 1-3 packs   Substance and Sexual Activity  . Alcohol use: Yes    Alcohol/week: 2.0 standard drinks    Types: 2 Shots  of liquor per week  . Drug use: Yes    Types: Cocaine, Marijuana, Benzodiazepines, Heroin    Comment: heroin  . Sexual activity: Yes    Birth control/protection: None    Comment: pt reluctant to answer questions and frequently stated " I dont know"  Other Topics Concern  . Not on file  Social History Narrative   ** Merged History Encounter **    Pt reported that he is unemployed, homeless as his mother is in rehab   Social Determinants of Health   Financial  Resource Strain:   . Difficulty of Paying Living Expenses: Not on file  Food Insecurity:   . Worried About Charity fundraiser in the Last Year: Not on file  . Ran Out of Food in the Last Year: Not on file  Transportation Needs:   . Lack of Transportation (Medical): Not on file  . Lack of Transportation (Non-Medical): Not on file  Physical Activity:   . Days of Exercise per Week: Not on file  . Minutes of Exercise per Session: Not on file  Stress:   . Feeling of Stress : Not on file  Social Connections:   . Frequency of Communication with Friends and Family: Not on file  . Frequency of Social Gatherings with Friends and Family: Not on file  . Attends Religious Services: Not on file  . Active Member of Clubs or Organizations: Not on file  . Attends Archivist Meetings: Not on file  . Marital Status: Not on file   Additional Social History:                         Sleep: Good  Appetite:  Fair  Current Medications: Current Facility-Administered Medications  Medication Dose Route Frequency Provider Last Rate Last Admin  . acetaminophen (TYLENOL) tablet 650 mg  650 mg Oral Q6H PRN Sharma Covert, MD      . alum & mag hydroxide-simeth (MAALOX/MYLANTA) 200-200-20 MG/5ML suspension 30 mL  30 mL Oral Q4H PRN Sharma Covert, MD      . cyclobenzaprine (FLEXERIL) tablet 5 mg  5 mg Oral TID PRN Sharma Covert, MD      . gabapentin (NEURONTIN) capsule 100 mg  100 mg Oral TID Sharma Covert, MD   100 mg at 05/01/19 0810  . haloperidol (HALDOL) tablet 5 mg  5 mg Oral BID Johnn Hai, MD   5 mg at 05/01/19 9678  . lithium carbonate (ESKALITH) CR tablet 450 mg  450 mg Oral Q12H Sharma Covert, MD   450 mg at 05/01/19 0811  . OLANZapine zydis (ZYPREXA) disintegrating tablet 10 mg  10 mg Oral Q8H PRN Sharma Covert, MD       And  . LORazepam (ATIVAN) tablet 1 mg  1 mg Oral PRN Sharma Covert, MD       And  . ziprasidone (GEODON) injection 20 mg   20 mg Intramuscular PRN Sharma Covert, MD      . magnesium hydroxide (MILK OF MAGNESIA) suspension 30 mL  30 mL Oral Daily PRN Sharma Covert, MD      . ondansetron (ZOFRAN-ODT) disintegrating tablet 4 mg  4 mg Oral Q8H PRN Sharma Covert, MD      . topiramate (TOPAMAX) tablet 25 mg  25 mg Oral BID Johnn Hai, MD   25 mg at 05/01/19 0810  . traZODone (DESYREL) tablet 50 mg  50 mg  Oral QHS PRN Antonieta Pertlary, [place]reston Klas Lawson, MD   50 mg at 04/30/19 2121    Lab Results: No results found for this or any previous visit (from the past 48 hour(s)).  Blood Alcohol level:  Lab Results  Component Value Date   ETH <10 04/27/2019   ETH <10 03/22/2019    Metabolic Disorder Labs: Lab Results  Component Value Date   HGBA1C 5.4 02/28/2019   MPG 108 02/28/2019   MPG 108 06/05/2016   Lab Results  Component Value Date   PROLACTIN 37.6 (H) 02/28/2019   PROLACTIN 32.7 (H) 02/22/2013   Lab Results  Component Value Date   CHOL 178 02/28/2019   TRIG 104 02/28/2019   HDL 60 02/28/2019   CHOLHDL 3.0 02/28/2019   VLDL 21 02/28/2019   LDLCALC 97 02/28/2019   LDLCALC 85 06/05/2016    Physical Findings: AIMS: Facial and Oral Movements Muscles of Facial Expression: None, normal Lips and Perioral Area: None, normal Jaw: None, normal Tongue: None, normal,Extremity Movements Upper (arms, wrists, hands, fingers): None, normal Lower (legs, knees, ankles, toes): None, normal, Trunk Movements Neck, shoulders, hips: None, normal, Overall Severity Severity of abnormal movements (highest score from questions above): None, normal Incapacitation due to abnormal movements: None, normal Patient's awareness of abnormal movements (rate only patient's report): No Awareness, Dental Status Current problems with teeth and/or dentures?: No Does patient usually wear dentures?: No  CIWA:    COWS:     Musculoskeletal: Strength & Muscle Tone: within normal limits Gait & Station: normal Patient leans:  N/A  Psychiatric Specialty Exam: Physical Exam  Nursing note and vitals reviewed. Constitutional: He appears well-developed and well-nourished.  HENT:  Head: Normocephalic and atraumatic.  Respiratory: Effort normal.  Neurological: He is alert.    Review of Systems  Blood pressure 130/68, pulse (!) 58, temperature 98.2 F (36.8 C), temperature source Oral, resp. rate 16, height 5\' 8"  (1.727 m), weight 70.3 kg, SpO2 100 %.Body mass index is 23.57 kg/m.  General Appearance: Disheveled  Eye Contact:  Fair  Speech:  Normal Rate  Volume:  Decreased  Mood:  Sedated  Affect:  Blunt  Thought Process:  Goal Directed and Descriptions of Associations: Circumstantial  Orientation:  Full (Time, Place, and Person)  Thought Content:  Delusions  Suicidal Thoughts:  No  Homicidal Thoughts:  No  Memory:  Immediate;   Poor Recent;   Poor Remote;   Poor  Judgement:  Impaired  Insight:  Fair  Psychomotor Activity:  Decreased  Concentration:  Concentration: Fair and Attention Span: Fair  Recall:  [employer]  Fund of Knowledge:  Fair  Language:  Fair  Akathisia:  Negative  Handed:  Right  AIMS (if indicated):     Assets:  Desire for Improvement Resilience  ADL's:  Intact  Cognition:  WNL  Sleep:  Number of Hours: 10     Treatment Plan Summary: Daily contact with patient to assess and evaluate symptoms and progress in treatment, Medication management and Plan : Patient is seen and examined.  Patient is a 23 year old male with the above-stated past psychiatric history who is seen in follow-up.   Diagnosis: #1 schizophrenia  Patient is seen in follow-up.  He is significantly sedated today.  He is been sleeping about 10 hours a night.  He continues on Flexeril, gabapentin, Haldol, lithium, Topamax and trazodone.  I am going to stop the trazodone.  I will also stop the Flexeril.  I will order a lithium level for the a.m. tomorrow morning  as well as electrolytes and TSH.  No other changes in his  medications given his history.  Hopefully this will decrease some of his daytime sedation. 1.  Stop Flexeril. 2.  Decrease gabapentin 200 mg p.o. twice daily for anxiety and mood stability. 3.  Continue haloperidol 5 mg p.o. twice daily for psychosis and mood stability. 4.  Continue lithium carbonate 450 mg p.o. every 12 hours for mood stability. 5.  Continue Zofran as needed for your nausea. 6.  Continue Topamax 25 mg p.o. twice daily for mood stability at least at this point. 7.  Stop trazodone. 8.  Lithium level, TSH and electrolytes in a.m. tomorrow. 9.  Disposition planning-in progress.  Antonieta Pert, MD 05/01/2019, 10:36 AM

## 2019-05-01 NOTE — Progress Notes (Signed)
   05/01/19 2150  Psych Admission Type (Psych Patients Only)  Admission Status Involuntary  Psychosocial Assessment  Patient Complaints None  Eye Contact Brief  Facial Expression Flat  Affect Sad;Depressed  Speech Slow  Interaction Minimal  Motor Activity Other (Comment) (WNL)  Appearance/Hygiene Unremarkable  Behavior Characteristics Cooperative;Guarded  Thought Process  Coherency WDL  Content WDL  Delusions None reported or observed  Perception WDL  Hallucination None reported or observed  Judgment Poor  Confusion None  Danger to Self  Current suicidal ideation? Denies  Danger to Others  Danger to Others None reported or observed   Pt is very guarded and minimal with a flat affect. Pt denies SI, HI, AVH and pain. Pt takes his meds appropriately. No suspicious behavior. Pt didn't ask for anything to sleep. Sleep last night = 10 hours.

## 2019-05-02 NOTE — Progress Notes (Signed)
[facility] Notes:  (Nursing/MHT/Case Management/Adjunct)  Date:  05/02/2019  Time:  0900  Type of Therapy:  Nurse Education Discussed coping skills for mental health disorders and created collages.   Participation Level:  Attended group, but did not participate.  Participation Quality:  Inattentive  Affect:  Flat  Cognitive:  Disorganized  Insight:  Limited  Engagement in Group:  Lacking, Limited and Poor  Modes of Intervention:  Activity and Discussion  Summary of Progress/Problems:  Marissa Calamity 05/02/2019, 10:29 AM

## 2019-05-02 NOTE — Progress Notes (Signed)
[place] NOVEL CORONAVIRUS (COVID-19) DAILY CHECK-OFF SYMPTOMS - answer yes or no to each - every day NO YES  Have you had a fever in the past 24 hours?  . Fever (Temp > 37.80C / 100F) X   Have you had any of these symptoms in the past 24 hours? . New Cough .  Sore Throat  .  Shortness of Breath .  Difficulty Breathing .  Unexplained Body Aches   X   Have you had any one of these symptoms in the past 24 hours not related to allergies?   . Runny Nose .  Nasal Congestion .  Sneezing   X   If you have had runny nose, nasal congestion, sneezing in the past 24 hours, has it worsened?  X   EXPOSURES - check yes or no X   Have you traveled outside the state in the past 14 days?  X   Have you been in contact with someone with a confirmed diagnosis of COVID-19 or PUI in the past 14 days without wearing appropriate PPE?  X   Have you been living in the same home as a person with confirmed diagnosis of COVID-19 or a PUI (household contact)?    X   Have you been diagnosed with COVID-19?    X              What to do next: Answered NO to all: Answered YES to anything:   Proceed with unit schedule Follow the [facility] Inpatient Flowsheet.   

## 2019-05-02 NOTE — Progress Notes (Signed)
[facility] MD Progress Note  05/02/2019 10:51 AM [name]  MRN:  [id number] Subjective:  Patient is a 23 year old male with a past psychiatric history significant for schizophrenia who was admitted on 12/15 secondary to suicidal thoughts and agitation.  Objective: Patient is seen and examined.  Patient is a 23 year old male with the above-stated past psychiatric history who is seen in follow-up.  He seems a bit less sedated today.  He is awake and alert in the day room.  He still very guarded.  He does not discuss a great deal of anything.  He denied any auditory or visual hallucinations.  He denied any suicidal or homicidal ideation.  He has been attending groups, but not really participating.  He continues to refuse vital signs.  He did sleep 6.75 hours last night.  Principal Problem: <principal problem not specified> Diagnosis: Active Problems:   Schizophrenia (HCC)   Bipolar 1 disorder (HCC)  Total Time spent with patient: 15 minutes  Past Psychiatric History: See admission H&P  Past Medical History:  Past Medical History:  Diagnosis Date  . ADD (attention deficit disorder)   . 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 Anemia   . Anxiety   . Bipolar disorder (HCC)   . Borderline personality disorder (HCC)   . Central auditory processing disorder   . Deliberate self-cutting   . Depressed   . Eczema   . Headache(784.0)   . Oppositional defiant disorder   . Schizophrenia [facility])     Past Surgical History:  Procedure Laterality Date  . BACK SURGERY    . FRACTURE SURGERY    . NO PAST SURGERIES    . POSTERIOR LUMBAR FUSION N/A 02/19/2015   Procedure: LATERAL L-2 CORPECTOMY;  Surgeon: Lisbeth Renshaw, MD;  Location: [facility] OR;  Service: Neurosurgery;  Laterality: N/A;  . POSTERIOR LUMBAR FUSION 4 LEVEL  02/19/2015   Procedure: Posterior T-12 - L-4 STABILIZATION OF POSTERIOR LUMBAR;  Surgeon: Lisbeth Renshaw, MD;  Location: [facility] OR;  Service:  Neurosurgery;;  . TIBIA IM NAIL INSERTION Left 02/20/2015   Procedure: INTRAMEDULLARY (IM) NAIL LEFT TIBIAL;  Surgeon: [name], MD;  Location: [facility] OR;  Service: Orthopedics;  Laterality: Left;   Family History: History reviewed. No pertinent family history. Family Psychiatric  History: See admission H&P Social History:  Social History   Substance and Sexual Activity  Alcohol Use Yes  . Alcohol/week: 2.0 standard drinks  . Types: 2 Shots of liquor per week     Social History   Substance and Sexual Activity  Drug Use Yes  . Types: Cocaine, Marijuana, Benzodiazepines, Heroin   Comment: heroin    Social History   Socioeconomic History  . Marital status: Single    Spouse name: Not on file  . Number of children: Not on file  . Years of education: Not on file  . Highest education level: Not on file  Occupational History  . Occupation: Unemployed  Tobacco Use  . Smoking status: Current Every Day Smoker    Packs/day: 2.00    Types: Cigarettes  . Smokeless tobacco: Never Used  . Tobacco comment: 1-3 packs   Substance and Sexual Activity  . Alcohol use: Yes    Alcohol/week: 2.0 standard drinks    Types: 2 Shots of liquor per week  . Drug use: Yes    Types: Cocaine, Marijuana, Benzodiazepines, Heroin    Comment: heroin  . Sexual activity: Yes    Birth control/protection: None    Comment:  pt reluctant to answer questions and frequently stated " I dont know"  Other Topics Concern  . Not on file  Social History Narrative   ** Merged History Encounter **    Pt reported that he is unemployed, homeless as his mother is in rehab   Social Determinants of Health   Financial Resource Strain:   . Difficulty of Paying Living Expenses: Not on file  Food Insecurity:   . Worried About Programme researcher, broadcasting/film/video in the Last Year: Not on file  . Ran Out of Food in the Last Year: Not on file  Transportation Needs:   . Lack of Transportation (Medical): Not on file  . Lack of  Transportation (Non-Medical): Not on file  Physical Activity:   . Days of Exercise per Week: Not on file  . Minutes of Exercise per Session: Not on file  Stress:   . Feeling of Stress : Not on file  Social Connections:   . Frequency of Communication with Friends and Family: Not on file  . Frequency of Social Gatherings with Friends and Family: Not on file  . Attends Religious Services: Not on file  . Active Member of Clubs or Organizations: Not on file  . Attends Banker Meetings: Not on file  . Marital Status: Not on file   Additional Social History:                         Sleep: Good  Appetite:  Fair  Current Medications: Current Facility-Administered Medications  Medication Dose Route Frequency Provider Last Rate Last Admin  . acetaminophen (TYLENOL) tablet 650 mg  650 mg Oral Q6H PRN Antonieta Pert, MD      . alum & mag hydroxide-simeth (MAALOX/MYLANTA) 200-200-20 MG/5ML suspension 30 mL  30 mL Oral Q4H PRN Antonieta Pert, MD      . gabapentin (NEURONTIN) capsule 100 mg  100 mg Oral BID Antonieta Pert, MD   100 mg at 05/02/19 0810  . haloperidol (HALDOL) tablet 5 mg  5 mg Oral BID Malvin Johns, MD   5 mg at 05/02/19 0810  . lithium carbonate (ESKALITH) CR tablet 450 mg  450 mg Oral Q12H Antonieta Pert, MD   450 mg at 05/02/19 0810  . OLANZapine zydis (ZYPREXA) disintegrating tablet 10 mg  10 mg Oral Q8H PRN Antonieta Pert, MD       And  . LORazepam (ATIVAN) tablet 1 mg  1 mg Oral PRN Antonieta Pert, MD       And  . ziprasidone (GEODON) injection 20 mg  20 mg Intramuscular PRN Antonieta Pert, MD      . magnesium hydroxide (MILK OF MAGNESIA) suspension 30 mL  30 mL Oral Daily PRN Antonieta Pert, MD      . ondansetron (ZOFRAN-ODT) disintegrating tablet 4 mg  4 mg Oral Q8H PRN Antonieta Pert, MD      . topiramate (TOPAMAX) tablet 25 mg  25 mg Oral BID Malvin Johns, MD   25 mg at 05/02/19 4098    Lab Results: No results  found for this or any previous visit (from the past 48 hour(s)).  Blood Alcohol level:  Lab Results  Component Value Date   [facility] <10 04/27/2019   ETH <10 03/22/2019    Metabolic Disorder Labs: Lab Results  Component Value Date   HGBA1C 5.4 02/28/2019   MPG 108 02/28/2019   MPG 108 06/05/2016  Lab Results  Component Value Date   PROLACTIN 37.6 (H) 02/28/2019   PROLACTIN 32.7 (H) 02/22/2013   Lab Results  Component Value Date   CHOL 178 02/28/2019   TRIG 104 02/28/2019   HDL 60 02/28/2019   CHOLHDL 3.0 02/28/2019   VLDL 21 02/28/2019   LDLCALC 97 02/28/2019   LDLCALC 85 06/05/2016    Physical Findings: AIMS: Facial and Oral Movements Muscles of Facial Expression: None, normal Lips and Perioral Area: None, normal Jaw: None, normal Tongue: None, normal,Extremity Movements Upper (arms, wrists, hands, fingers): None, normal Lower (legs, knees, ankles, toes): None, normal, Trunk Movements Neck, shoulders, hips: None, normal, Overall Severity Severity of abnormal movements (highest score from questions above): None, normal Incapacitation due to abnormal movements: None, normal Patient's awareness of abnormal movements (rate only patient's report): No Awareness, Dental Status Current problems with teeth and/or dentures?: No Does patient usually wear dentures?: No  CIWA:    COWS:     Musculoskeletal: Strength & Muscle Tone: within normal limits Gait & Station: normal Patient leans: N/A  Psychiatric Specialty Exam: Physical Exam  Nursing note and vitals reviewed. Constitutional: He is oriented to person, place, and time. He appears well-developed and well-nourished.  HENT:  Head: Normocephalic and atraumatic.  Respiratory: Effort normal.  Neurological: He is alert and oriented to person, place, and time.    Review of Systems  Blood pressure 130/68, pulse (!) 58, temperature 98.2 F (36.8 C), temperature source Oral, resp. rate 16, height 5\' 8"  (1.727 m), weight  70.3 kg, SpO2 100 %.Body mass index is 23.57 kg/m.  General Appearance: Disheveled  Eye Contact:  Fair  Speech:  Normal Rate  Volume:  Decreased  Mood:  Anxious, Dysphoric and Irritable  Affect:  Flat  Thought Process:  Goal Directed and Descriptions of Associations: Circumstantial  Orientation:  Full (Time, Place, and Person)  Thought Content:  Delusions  Suicidal Thoughts:  No  Homicidal Thoughts:  No  Memory:  Immediate;   Fair Recent;   Fair Remote;   Fair  Judgement:  Impaired  Insight:  Lacking  Psychomotor Activity:  Normal  Concentration:  Concentration: Fair and Attention Span: Fair  Recall:  [employer]  Fund of Knowledge:  Fair  Language:  Fair  Akathisia:  Negative  Handed:  Right  AIMS (if indicated):     Assets:  Desire for Improvement Resilience  ADL's:  Intact  Cognition:  WNL  Sleep:  Number of Hours: 6.75     Treatment Plan Summary: Daily contact with patient to assess and evaluate symptoms and progress in treatment, Medication management and Plan : Patient is seen and examined.  Patient is a 23 year old male with the above-stated past psychiatric history who is seen in follow-up.  Patient is a 23 year old male with the above-stated past psychiatric history who is seen in follow-up.   Patient is seen in follow-up.  He is essentially unchanged from yesterday except for a little bit more awake.  His sleep is now at 6.75 hours instead of the 10 hours a night.  No other changes in his medicines.  Hopefully he will continue to improve.  He has a lithium level, TSH and electrolytes that were done this a.m.  Those results are pending.  1.  Continue gabapentin 200 mg p.o. twice daily for anxiety and mood stability. 2.  Continue haloperidol 5 mg p.o. twice daily for psychosis and mood stability. 3.  Continue lithium carbonate 450 mg p.o. every 12 hours for mood stability. 4.  Continue Zofran as needed for your nausea. 5.  Continue Topamax 25 mg p.o. twice daily for mood  stability at least at this point. 6.  Stop trazodone. 7.  Lithium level, TSH and electrolytes today. 9.  Disposition planning-in progress. Sharma Covert, MD 05/02/2019, 10:51 AM

## 2019-05-02 NOTE — Progress Notes (Signed)
   05/02/19 1000  Psych Admission Type (Psych Patients Only)  Admission Status Involuntary  Psychosocial Assessment  Patient Complaints None  Eye Contact Brief  Facial Expression Flat  Affect Sad;Depressed  Speech Slow  Interaction Minimal  Motor Activity Other (Comment) (WNL)  Appearance/Hygiene Unremarkable  Behavior Characteristics Cooperative;Guarded  Mood Depressed;Preoccupied  Thought Process  Coherency WDL  Content WDL  Delusions None reported or observed  Perception WDL  Hallucination None reported or observed  Judgment Poor  Confusion None  Danger to Self  Current suicidal ideation? Denies  Danger to Others  Danger to Others None reported or observed

## 2019-05-03 NOTE — Tx Team (Signed)
Interdisciplinary Treatment and Diagnostic Plan Update  05/03/2019 Time of Session: 10:10am [name] MRN: [id number]  Principal Diagnosis: <principal problem not specified>  Secondary Diagnoses: Active Problems:   Schizophrenia (HCC)   Bipolar 1 disorder (HCC)   Current Medications:  Current Facility-Administered Medications  Medication Dose Route Frequency Provider Last Rate Last Admin  . acetaminophen (TYLENOL) tablet 650 mg  650 mg Oral Q6H PRN Antonieta Pert, MD      . alum & mag hydroxide-simeth (MAALOX/MYLANTA) 200-200-20 MG/5ML suspension 30 mL  30 mL Oral Q4H PRN Antonieta Pert, MD      . gabapentin (NEURONTIN) capsule 100 mg  100 mg Oral BID Antonieta Pert, MD   100 mg at 05/03/19 0755  . haloperidol (HALDOL) tablet 5 mg  5 mg Oral BID Malvin Johns, MD   5 mg at 05/03/19 0755  . lithium carbonate (ESKALITH) CR tablet 450 mg  450 mg Oral Q12H Antonieta Pert, MD   450 mg at 05/03/19 0755  . OLANZapine zydis (ZYPREXA) disintegrating tablet 10 mg  10 mg Oral Q8H PRN Antonieta Pert, MD       And  . LORazepam (ATIVAN) tablet 1 mg  1 mg Oral PRN Antonieta Pert, MD       And  . ziprasidone (GEODON) injection 20 mg  20 mg Intramuscular PRN Antonieta Pert, MD      . magnesium hydroxide (MILK OF MAGNESIA) suspension 30 mL  30 mL Oral Daily PRN Antonieta Pert, MD      . ondansetron (ZOFRAN-ODT) disintegrating tablet 4 mg  4 mg Oral Q8H PRN Antonieta Pert, MD      . topiramate (TOPAMAX) tablet 25 mg  25 mg Oral BID Malvin Johns, MD   25 mg at 05/03/19 6195   PTA Medications: Medications Prior to Admission  Medication Sig Dispense Refill Last Dose  . benztropine (COGENTIN) 1 MG tablet Take 1 mg by mouth 2 (two) times daily.     . diphenhydrAMINE (BENADRYL) 50 MG capsule 1 in am 1 at hs (Patient taking differently: Take 50 mg by mouth 2 (two) times daily. ) 60 capsule 11   . gabapentin (NEURONTIN) 300 MG capsule Take 300 mg by mouth 3  (three) times daily.     . haloperidol decanoate (HALDOL DECANOATE) 50 MG/ML injection Inject 4 mLs (200 mg total) into the muscle every 28 (twenty-eight) days. 4 mL 11   . lithium carbonate (ESKALITH) 450 MG CR tablet 1 in am 1 at h s (Patient taking differently: Take 450 mg by mouth 2 (two) times daily. ) 60 tablet 11   . traZODone (DESYREL) 150 MG tablet Take 1 tablet (150 mg total) by mouth at bedtime. 30 tablet 11     Patient Stressors: Medication change or noncompliance  Patient Strengths: General fund of knowledge Physical Health  Treatment Modalities: Medication Management, Group therapy, Case management,  1 to 1 session with clinician, Psychoeducation, Recreational therapy.   Physician Treatment Plan for Primary Diagnosis: <principal problem not specified> Long Term Goal(s): Improvement in symptoms so as ready for discharge Improvement in symptoms so as ready for discharge   Short Term Goals: Ability to identify changes in lifestyle to reduce recurrence of condition will improve Ability to verbalize feelings will improve Ability to disclose and discuss suicidal ideas Ability to demonstrate self-control will improve Ability to identify and develop effective coping behaviors will improve Ability to maintain clinical measurements within normal limits will improve  Ability to identify changes in lifestyle to reduce recurrence of condition will improve Ability to verbalize feelings will improve Ability to disclose and discuss suicidal ideas Ability to demonstrate self-control will improve Ability to identify and develop effective coping behaviors will improve Ability to maintain clinical measurements within normal limits will improve Compliance with prescribed medications will improve  Medication Management: Evaluate patient's response, side effects, and tolerance of medication regimen.  Therapeutic Interventions: 1 to 1 sessions, Unit Group sessions and Medication  administration.  Evaluation of Outcomes: Progressing  Physician Treatment Plan for Secondary Diagnosis: Active Problems:   Schizophrenia ([place])   Bipolar 1 disorder ([place])  Long Term Goal(s): Improvement in symptoms so as ready for discharge Improvement in symptoms so as ready for discharge   Short Term Goals: Ability to identify changes in lifestyle to reduce recurrence of condition will improve Ability to verbalize feelings will improve Ability to disclose and discuss suicidal ideas Ability to demonstrate self-control will improve Ability to identify and develop effective coping behaviors will improve Ability to maintain clinical measurements within normal limits will improve Ability to identify changes in lifestyle to reduce recurrence of condition will improve Ability to verbalize feelings will improve Ability to disclose and discuss suicidal ideas Ability to demonstrate self-control will improve Ability to identify and develop effective coping behaviors will improve Ability to maintain clinical measurements within normal limits will improve Compliance with prescribed medications will improve     Medication Management: Evaluate patient's response, side effects, and tolerance of medication regimen.  Therapeutic Interventions: 1 to 1 sessions, Unit Group sessions and Medication administration.  Evaluation of Outcomes: Progressing   RN Treatment Plan for Primary Diagnosis: <principal problem not specified> Long Term Goal(s): Knowledge of disease and therapeutic regimen to maintain health will improve  Short Term Goals: Ability to participate in decision making will improve, Ability to verbalize feelings will improve, Ability to disclose and discuss suicidal ideas, Ability to identify and develop effective coping behaviors will improve and Compliance with prescribed medications will improve  Medication Management: RN will administer medications as ordered by provider, will assess  and evaluate patient's response and provide education to patient for prescribed medication. RN will report any adverse and/or side effects to prescribing provider.  Therapeutic Interventions: 1 on 1 counseling sessions, Psychoeducation, Medication administration, Evaluate responses to treatment, Monitor vital signs and CBGs as ordered, Perform/monitor CIWA, COWS, AIMS and Fall Risk screenings as ordered, Perform wound care treatments as ordered.  Evaluation of Outcomes: Progressing   LCSW Treatment Plan for Primary Diagnosis: <principal problem not specified> Long Term Goal(s): Safe transition to appropriate next level of care at discharge, Engage patient in therapeutic group addressing interpersonal concerns.  Short Term Goals: Engage patient in aftercare planning with referrals and resources and Increase skills for wellness and recovery  Therapeutic Interventions: Assess for all discharge needs, 1 to 1 time with Social worker, Explore available resources and support systems, Assess for adequacy in community support network, Educate family and significant other(s) on suicide prevention, Complete Psychosocial Assessment, Interpersonal group therapy.  Evaluation of Outcomes: Progressing   Progress in Treatment: Attending groups: Yes. Participating in groups: Yes. Taking medication as prescribed: Yes. Toleration medication: Yes. Family/Significant other contact made: Yes, individual(s) contacted:  pt's legal guardian/mother Patient understands diagnosis: Yes. Discussing patient identified problems/goals with staff: Yes. Medical problems stabilized or resolved: Yes. Denies suicidal/homicidal ideation: Yes. Issues/concerns per patient self-inventory: No. Other:   New problem(s) identified: No, Describe:  None  New Short Term/Long Term Goal(s): Medication  stabilization, elimination of SI thoughts, and development of a comprehensive mental wellness plan.   Patient Goals:    Discharge  Plan or Barriers: Patient will be discharging home and following up with his ACTT team through [facility].   Reason for Continuation of Hospitalization: Medication stabilization  Estimated Length of Stay: 1-2 days   Attendees: Patient: 05/03/2019   Physician: Dr. Malvin JohnsBrian Farah, MD  05/03/2019   Nursing: Luanne BrasRoni, RN  05/03/2019   RN Care Manager: 05/03/2019   Social Worker: Stephannie PetersJasmine Chrishana Spargur, LCSW  05/03/2019   Recreational Therapist:  05/03/2019   Other:  05/03/2019   Other:  05/03/2019   Other: 05/03/2019      Scribe for Treatment Team: Delphia GratesJasmine M Gedeon Brandow, LCSW 05/03/2019 11:23 AM

## 2019-05-03 NOTE — Progress Notes (Signed)
Recreation Therapy Notes  Date: 12.21.20 Time: 1000 Location: 500 [address] Dayroom  Group Topic: Coping Skills  Goal Area(s) Addresses:  Patient will identify positive coping skills. Patient will identify benefit of using positive coping skills.  Behavioral Response: Minimal  Intervention: Worksheet  Activity: Coping Skills A to Z.  Patients were to identify coping skills for each letter of the alphabet.  Patients would then identify the top five coping skills they use.  Education: Radiographer, therapeutic, Dentist.   Education Outcome: Acknowledges understanding/In group clarification offered/Needs additional education.   Clinical Observations/Feedback: Pt was quiet but bright.  Pt was somewhat focused on why he wasn't discharging today.  Pt would answer when prompted.  When asked about coping skills, pt would answer "I don't know" or "I don't do anything".  Pt was able to identify some coping skills as bike riding, sleep, communication, yoga, walk and volleyball.    Victorino Sparrow, LRT/CTRS    Ria Comment, Alex Mcmanigal A 05/03/2019 11:05 AM

## 2019-05-03 NOTE — Progress Notes (Signed)
Patient has been isolative to his room other than coming to receive his medications and snack before returning to his room. He denies si/hi auditory and visual hallucination. He was encouraged to come to dayroom if he felt up to getting out of his room some. Safety maintained with 15 min checks.

## 2019-05-03 NOTE — Progress Notes (Signed)
[facility] Progress [facility]  05/03/2019 7:48 AM [name]  MRN:  [id number] Subjective:    Patient is a 23 year old male with a past psychiatric history significant for schizophrenia who was admitted on 12/15 secondary to suicidal thoughts and agitation.  Patient now calm in his room he is requesting discharge he is agreeable to continue with the act team he denies thoughts of harming self or others he is not threatening to self or others and he denies auditory or visual hallucinations.  Arrangement has been made with the act team.  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 ADD (attention deficit disorder)   . 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 Anemia   . Anxiety   . Bipolar disorder (HCC)   . Borderline personality disorder (HCC)   . Central auditory processing disorder   . Deliberate self-cutting   . Depressed   . Eczema   . Headache(784.0)   . Oppositional defiant disorder   . Schizophrenia [facility](HCC)     Past Surgical History:  Procedure Laterality Date  . BACK SURGERY    . FRACTURE SURGERY    . NO PAST SURGERIES    . POSTERIOR LUMBAR FUSION N/A 02/19/2015   Procedure: LATERAL L-2 CORPECTOMY;  Surgeon: Lisbeth RenshawNeelesh Nundkumar, MD;  Location: [facility] OR;  Service: Neurosurgery;  Laterality: N/A;  . POSTERIOR LUMBAR FUSION 4 LEVEL  02/19/2015   Procedure: Posterior T-12 - L-4 STABILIZATION OF POSTERIOR LUMBAR;  Surgeon: Lisbeth RenshawNeelesh Nundkumar, MD;  Location: [facility] OR;  Service: Neurosurgery;;  . TIBIA IM NAIL INSERTION Left 02/20/2015   Procedure: INTRAMEDULLARY (IM) NAIL LEFT TIBIAL;  Surgeon: Samson FredericBrian Swinteck, MD;   Location: [facility] OR;  Service: Orthopedics;  Laterality: Left;   Family History: History reviewed. No pertinent family history. Family Psychiatric  History: see eval Social History:  Social History   Substance and Sexual Activity  Alcohol Use Yes  . Alcohol/week: 2.0 standard drinks  . Types: 2 Shots of liquor per week     Social History   Substance and Sexual Activity  Drug Use Yes  . Types: Cocaine, Marijuana, Benzodiazepines, Heroin   Comment: heroin    Social History   Socioeconomic History  . Marital status: Single    Spouse name: Not on file  . Number of children: Not on file  . Years of education: Not on file  . Highest education level: Not on file  Occupational History  . Occupation: Unemployed  Tobacco Use  . Smoking status: Current Every Day Smoker    Packs/day: 2.00    Types: Cigarettes  . Smokeless tobacco: Never Used  . Tobacco comment: 1-3 packs   Substance and Sexual Activity  . Alcohol use: Yes    Alcohol/week: 2.0 standard drinks    Types: 2 Shots of liquor per week  . Drug use: Yes    Types: Cocaine, Marijuana, Benzodiazepines, Heroin    Comment: heroin  . Sexual activity: Yes    Birth control/protection: None    Comment: pt reluctant to answer questions and frequently stated " I dont know"  Other Topics Concern  . Not on file  Social History Narrative   ** Merged History Encounter **  Pt reported that he is unemployed, homeless as his mother is in rehab   Social Determinants of Health   Financial Resource Strain:   . Difficulty of Paying Living Expenses: Not on file  Food Insecurity:   . Worried About Programme researcher, broadcasting/film/video in the Last Year: Not on file  . Ran Out of Food in the Last Year: Not on file  Transportation Needs:   . Lack of Transportation (Medical): Not on file  . Lack of Transportation (Non-Medical): Not on file  Physical Activity:   . Days of Exercise per Week: Not on file  . Minutes of Exercise per Session: Not on file   Stress:   . Feeling of Stress : Not on file  Social Connections:   . Frequency of Communication with Friends and Family: Not on file  . Frequency of Social Gatherings with Friends and Family: Not on file  . Attends Religious Services: Not on file  . Active Member of Clubs or Organizations: Not on file  . Attends Banker Meetings: Not on file  . Marital Status: Not on file   Additional Social History:                         Sleep: Fair  Appetite:  Fair  Current Medications: Current Facility-Administered Medications  Medication Dose Route Frequency Provider Last Rate Last Admin  . acetaminophen (TYLENOL) tablet 650 mg  650 mg Oral Q6H PRN Antonieta Pert, MD      . alum & mag hydroxide-simeth (MAALOX/MYLANTA) 200-200-20 MG/5ML suspension 30 mL  30 mL Oral Q4H PRN Antonieta Pert, MD      . gabapentin (NEURONTIN) capsule 100 mg  100 mg Oral BID Antonieta Pert, MD   100 mg at 05/02/19 1719  . haloperidol (HALDOL) tablet 5 mg  5 mg Oral BID Malvin Johns, MD   5 mg at 05/02/19 1719  . lithium carbonate (ESKALITH) CR tablet 450 mg  450 mg Oral Q12H Antonieta Pert, MD   450 mg at 05/02/19 2048  . OLANZapine zydis (ZYPREXA) disintegrating tablet 10 mg  10 mg Oral Q8H PRN Antonieta Pert, MD       And  . LORazepam (ATIVAN) tablet 1 mg  1 mg Oral PRN Antonieta Pert, MD       And  . ziprasidone (GEODON) injection 20 mg  20 mg Intramuscular PRN Antonieta Pert, MD      . magnesium hydroxide (MILK OF MAGNESIA) suspension 30 mL  30 mL Oral Daily PRN Antonieta Pert, MD      . ondansetron (ZOFRAN-ODT) disintegrating tablet 4 mg  4 mg Oral Q8H PRN Antonieta Pert, MD      . topiramate (TOPAMAX) tablet 25 mg  25 mg Oral BID Malvin Johns, MD   25 mg at 05/02/19 1719    Lab Results: No results found for this or any previous visit (from the past 48 hour(s)).  Blood Alcohol level:  Lab Results  Component Value Date   [facility] <10 04/27/2019   ETH  <10 03/22/2019    Metabolic Disorder Labs: Lab Results  Component Value Date   HGBA1C 5.4 02/28/2019   MPG 108 02/28/2019   MPG 108 06/05/2016   Lab Results  Component Value Date   PROLACTIN 37.6 (H) 02/28/2019   PROLACTIN 32.7 (H) 02/22/2013   Lab Results  Component Value Date   CHOL 178 02/28/2019   TRIG  104 02/28/2019   HDL 60 02/28/2019   CHOLHDL 3.0 02/28/2019   VLDL 21 02/28/2019   LDLCALC 97 02/28/2019   LDLCALC 85 06/05/2016    Physical Findings: AIMS: Facial and Oral Movements Muscles of Facial Expression: None, normal Lips and Perioral Area: None, normal Jaw: None, normal Tongue: None, normal,Extremity Movements Upper (arms, wrists, hands, fingers): None, normal Lower (legs, knees, ankles, toes): None, normal, Trunk Movements Neck, shoulders, hips: None, normal, Overall Severity Severity of abnormal movements (highest score from questions above): None, normal Incapacitation due to abnormal movements: None, normal Patient's awareness of abnormal movements (rate only patient's report): No Awareness, Dental Status Current problems with teeth and/or dentures?: No Does patient usually wear dentures?: No  CIWA:    COWS:     Musculoskeletal: Strength & Muscle Tone: within normal limits Gait & Station: normal Patient leans: N/A  Psychiatric Specialty Exam: Physical Exam  Review of Systems  Blood pressure 130/60, pulse (!) 54, temperature 98.1 F (36.7 C), temperature source Oral, resp. rate 16, height 5\' 8"  (1.727 m), weight 70.3 kg, SpO2 100 %.Body mass index is 23.57 kg/m.  General Appearance: Casual  Eye Contact:  Fair  Speech:  Clear and Coherent  Volume:  Decreased  Mood:  Dysphoric  Affect:  Congruent  Thought Process:  Linear  Orientation:  Full (Time, Place, and Person)  Thought Content:  Denies hallucinations prominent for poverty of content today/negative symptoms  Suicidal Thoughts:  No  Homicidal Thoughts:  No  Memory:  Immediate;    Poor Recent;   Fair Remote;   Fair  Judgement:  Fair  Insight:  Fair  Psychomotor Activity:  Normal  Concentration:  Concentration: Fair and Attention Span: Fair  Recall:  [employer] of Knowledge:  Fair  Language:  Fair  Akathisia:  Negative  Handed:  Right  AIMS (if indicated):     Assets:  Leisure Time Physical Health  ADL's:  Intact  Cognition:  WNL  Sleep:  Number of Hours: 6.75     Treatment Plan Summary: Daily contact with patient to assess and evaluate symptoms and progress in treatment and Medication management  No change in medications today Contact act team Discussed with team Arrange for discharge tomorrow most likely  [facility], MD 05/03/2019, 7:48 AM

## 2019-05-03 NOTE — Progress Notes (Signed)
   05/03/19 2200  Psych Admission Type (Psych Patients Only)  Admission Status Voluntary  Psychosocial Assessment  Patient Complaints None  Eye Contact Brief  Facial Expression Flat  Affect Sad;Depressed  Speech Slow  Interaction Isolative  Motor Activity Slow  Appearance/Hygiene Unremarkable  Behavior Characteristics Guarded  Mood Preoccupied;Pleasant  Thought Process  Coherency Unable to assess  Content UTA  Delusions None reported or observed  Perception UTA  Hallucination None reported or observed  Judgment Poor  Confusion None  Danger to Self  Current suicidal ideation? Denies  Danger to Others  Danger to Others None reported or observed

## 2019-05-04 MED ORDER — BENZTROPINE MESYLATE 1 MG PO TABS: 1.0000 mg | ORAL_TABLET | Freq: Two times a day (BID) | ORAL | 3 refills | Status: DC

## 2019-05-04 MED ORDER — TOPIRAMATE 50 MG PO TABS: 50.0000 mg | ORAL_TABLET | Freq: Two times a day (BID) | ORAL | 2 refills | Status: DC

## 2019-05-04 MED ORDER — HALOPERIDOL DECANOATE 50 MG/ML IM SOLN: 200.0000 mg | INTRAMUSCULAR | 11 refills | Status: DC

## 2019-05-04 MED ORDER — LITHIUM CARBONATE ER 450 MG PO TBCR: 450.0000 mg | EXTENDED_RELEASE_TABLET | Freq: Two times a day (BID) | ORAL | 2 refills | Status: DC

## 2019-05-04 MED ORDER — GABAPENTIN 300 MG PO CAPS: 300.0000 mg | ORAL_CAPSULE | Freq: Three times a day (TID) | ORAL | 11 refills | Status: DC

## 2019-05-04 NOTE — Progress Notes (Signed)
  [facility] Adult Case Management Discharge Plan :  Will you be returning to the same living situation after discharge:  Yes,  pt's apartment At discharge, do you have transportation home?: Yes,  [employer] Do you have the ability to pay for your medications: Yes,  private insurance  Release of information consent forms completed and in the chart;  Patient's signature needed at discharge.  Patient to Follow up at: Follow-up Information    [facility] Go on 05/05/2019.   Why: Your ACTT team will see you on Wednesday, December 23rd @ 12pm at your apartment.  Contact information: [place] [address] [phone]           Next level of care provider has access to [place] and Suicide Prevention discussed: Yes,  pt's mother/legal guardian  Have you used any form of tobacco in the last 30 days? (Cigarettes, Smokeless Tobacco, Cigars, and/or Pipes): Yes  Has patient been referred to the Quitline?: Patient refused referral  Patient has been referred for addiction treatment: Yes  Trecia Rogers, LCSW 05/04/2019, 10:38 AM

## 2019-05-04 NOTE — Plan of Care (Signed)
Pt gave minimal participation but was calm and appropriate during recreation therapy group sessions.   Victorino Sparrow, LRT/CTRS

## 2019-05-04 NOTE — Progress Notes (Signed)
Patient ID: [name], male   DOB: 03-22-1996, 23 y.o.   MRN: [id number]   CSW scheduled pt's [place] for 11am. CSW informed pt's nurse.

## 2019-05-04 NOTE — Progress Notes (Signed)
Recreation Therapy Notes  Date: 12.22.20 Time: 1000 Location: 500 Hall  Group Topic: Self-Esteem  Goal Area(s) Addresses:  Patient will successfully identify positive attributes about themselves.  Patient will successfully identify benefit of improved self-esteem.   Behavioral Response: None  Intervention: Worksheet  Activity: Education officer, environmental.  Patients were to identify 3 positives things that have helped to shape them in the categories of things they're good at; compliments; challenges overcome; things that make them unique; what they value, how they have helped others, times they have made others happy and what they like about their appearance.  Education:  Self-Esteem, Dentist.   Education Outcome: Acknowledges education/In group clarification offered/Needs additional education  Clinical Observations/Feedback: Pt was bright and smiling.  Pt focused on talking to social worker about discharge.    Victorino Sparrow, LRT/CTRS    Ria Comment, Alveda Vanhorne A 05/04/2019 10:50 AM

## 2019-05-04 NOTE — Progress Notes (Signed)
Recreation Therapy Notes  INPATIENT RECREATION TR PLAN  Patient Details Name: [name] MRN: [id number] DOB: November 14, 1995 Today's Date: 05/04/2019  Rec Therapy Plan Is patient appropriate for Therapeutic Recreation?: Yes Treatment times per week: about 3 days Estimated Length of Stay: 5-7 days TR Treatment/Interventions: Group participation (Comment)  Discharge Criteria Pt will be discharged from therapy if:: Discharged Treatment plan/goals/alternatives discussed and agreed upon by:: Patient/family  Discharge Summary Short term goals set: See patient care plan Short term goals met: Adequate for discharge Progress toward goals comments: Groups attended Which groups?: Coping skills, Self-esteem, Other (Comment)(Team building) Reason goals not met: None Therapeutic equipment acquired: N/A Reason patient discharged from therapy: Discharge from hospital Pt/family agrees with progress & goals achieved: Yes Date patient discharged from therapy: 05/04/19    Victorino Sparrow, LRT/CTRS  Ria Comment, Oree Hislop A 05/04/2019, 11:16 AM

## 2019-05-04 NOTE — Progress Notes (Signed)
Patient ID: [name], male   DOB: [birth year], 23 y.o.   MRN: [id number]   CSW contacted pt's mother and asked about where the patient will be discharging today. [name] stated that the patient does have an apartment that he can go to. She asked about his medications and asked about giving that list to his ACTT team, as well. [name] gave verbal permission for his ROIs since she couldn't come to sign them until later.

## 2019-05-04 NOTE — Progress Notes (Signed)
Patient ID: [name], male   DOB: July 17, 1995, 23 y.o.   MRN: [id number] Patient discharged to home/self care on his own accord.  Patient denies SI, HI and AVH upon discharge.  Patient acknowledges understanding of all discharge instructions and receipt of personal belongings.

## 2019-05-04 NOTE — BHH Suicide Risk Assessment (Signed)
[facility] Discharge Suicide Risk Assessment   Principal Problem: Exacerbation of schizophrenic condition Discharge Diagnoses: Active Problems:   Schizophrenia ([place])   Bipolar 1 disorder ([place])   Total Time spent with patient: 45 minutes  Musculoskeletal: Strength & Muscle Tone: within normal limits Gait & Station: normal Patient leans: N/A  Psychiatric Specialty Exam: Physical Exam  Review of Systems  Blood pressure 130/60, pulse (!) 54, temperature 98.1 F (36.7 C), temperature source Oral, resp. rate 16, height 5\' 8"  (1.727 m), weight 70.3 kg, SpO2 100 %.Body mass index is 23.57 kg/m.  General Appearance: Casual  Eye Contact:  Good  Speech:  Clear and Coherent  Volume:  Normal  Mood:  Euthymic  Affect:  Constricted  Thought Process:  Coherent and Goal Directed  Orientation:  Full (Time, Place, and Person)  Thought Content:  Denies auditory or visual hallucinations no paranoid discerned and denies wanting to harm self or others  Suicidal Thoughts:  No  Homicidal Thoughts:  No  Memory:  Immediate;   Fair Recent;   Fair Remote;   Fair  Judgement:  Fair  Insight:  Fair  Psychomotor Activity:  Normal  Concentration:  Concentration: Fair and Attention Span: Fair  Recall:  [employer] of Knowledge:  Fair  Language:  Fair  Akathisia:  Negative  Handed:  Right  AIMS (if indicated):     Assets:  Communication Skills Desire for Improvement  ADL's:  Intact  Cognition:  WNL  Sleep:  Number of Hours: 10.25    Mental Status Per Nursing Assessment::   On Admission:  Suicidal ideation indicated by patient, Self-harm behaviors  Demographic Factors:  Unemployed  Loss Factors: Decrease in vocational status  Historical Factors: Impulsivity  Risk Reduction Factors:   Sense of responsibility to family  Continued Clinical Symptoms:  Schizophrenia:   Less than 73 years old  Cognitive Features That Contribute To Risk:  Loss of executive function    Suicide Risk:  Minimal: No  identifiable suicidal ideation.  Patients presenting with no risk factors but with morbid ruminations; may be classified as minimal risk based on the severity of the depressive symptoms  Follow-up Information    [facility] Follow up.   Contact information: [place] [address] [phone]           Plan Of Care/Follow-up recommendations:  Activity:  full  Kinda Pottle, MD 05/04/2019, 9:58 AM

## 2019-05-04 NOTE — Discharge Summary (Signed)
Physician Discharge Summary Note  Patient:  [name] is an 23 y.o., male MRN:  [id number] DOB:  01-May-1996 Patient phone:  [phone] (home)  Patient address:   [address] [place] [id number],  Total Time spent with patient: 45 minutes  Date of Admission:  04/27/2019 Date of Discharge: 05/04/2019  Reason for Admission:    This is the latest of multiple admissions and healthcare system encounters for this 23 year old patient who is been diagnosed with a schizophrenic condition, history of drug-induced psychosis, complicated by antisocial traits versus disorder, chronic resistance and refusal of medication, who recently required very high-dose long-acting injectable due to threats to kill his mother.  He has no longer endorsing this however he presented through the emergency department earlier in the month, on 12/10 presented complaining of a penile discharge, by 12/15 presented complaining of several things requesting medications for his bipolar condition, then endorsing suicidal thoughts to overdose on blood pressure medicine but then elaborating he did not have any blood pressure medication.  Again he was described as generally unstable, variable symptomatology reported and was admitted for stabilization.  See the admission note for more details.  Further, he had developed somewhat of an eating disorder and that he is afraid of gaining weight and requesting medications for weight loss  Principal Problem: Exacerbation of psychotic disorder under/unmedicated Discharge Diagnoses: Active Problems:   Schizophrenia ([facility])   Bipolar 1 disorder ([facility])   Past Psychiatric History: Extensive see admission note  Past Medical History:  Past Medical History:  Diagnosis Date  . ADD (attention deficit disorder)   . 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 Anemia   . Anxiety   . Bipolar disorder (H[facility])   . Borderline personality  disorder ([facility])   . Central auditory processing disorder   . Deliberate self-cutting   . Depressed   . Eczema   . Headache(784.0)   . Oppositional defiant disorder   . Schizophrenia [facility](HCC)     Past Surgical History:  Procedure Laterality Date  . BACK SURGERY    . FRACTURE SURGERY    . NO PAST SURGERIES    . POSTERIOR LUMBAR FUSION N/A 02/19/2015   Procedure: LATERAL L-2 CORPECTOMY;  Surgeon: Lisbeth RenshawNeelesh Nundkumar, MD;  Location: [facility] OR;  Service: Neurosurgery;  Laterality: N/A;  . POSTERIOR LUMBAR FUSION 4 LEVEL  02/19/2015   Procedure: Posterior T-12 - L-4 STABILIZATION OF POSTERIOR LUMBAR;  Surgeon: Lisbeth RenshawNeelesh Nundkumar, MD;  Location: [facility] OR;  Service: Neurosurgery;;  . TIBIA IM NAIL INSERTION Left 02/20/2015   Procedure: INTRAMEDULLARY (IM) NAIL LEFT TIBIAL;  Surgeon: Samson FredericBrian Swinteck, MD;  Location: [facility] OR;  Service: Orthopedics;  Laterality: Left;   Family History: History reviewed. No pertinent family history. Family Psychiatric  History: Emelia LoronGrandfather is an untreated psychotic with a bad influence on this patient Social History:  Social History   Substance and Sexual Activity  Alcohol Use Yes  . Alcohol/week: 2.0 standard drinks  . Types: 2 Shots of liquor per week     Social History   Substance and Sexual Activity  Drug Use Yes  . Types: Cocaine, Marijuana, Benzodiazepines, Heroin   Comment: heroin    Social History   Socioeconomic History  . Marital status: Single    Spouse name: Not on file  . Number of children: Not on file  . Years of education: Not on file  . Highest education level: Not on file  Occupational History  . Occupation: Unemployed  Tobacco Use  .  Smoking status: Current Every Day Smoker    Packs/day: 2.00    Types: Cigarettes  . Smokeless tobacco: Never Used  . Tobacco comment: 1-3 packs   Substance and Sexual Activity  . Alcohol use: Yes    Alcohol/week: 2.0 standard drinks    Types: 2 Shots of liquor per week  . Drug use: Yes    Types: Cocaine,  Marijuana, Benzodiazepines, Heroin    Comment: heroin  . Sexual activity: Yes    Birth control/protection: None    Comment: pt reluctant to answer questions and frequently stated " I dont know"  Other Topics Concern  . Not on file  Social History Narrative   ** Merged History Encounter **    Pt reported that he is unemployed, homeless as his mother is in rehab   Social Determinants of Health   Financial Resource Strain:   . Difficulty of Paying Living Expenses: Not on file  Food Insecurity:   . Worried About Programme researcher, broadcasting/film/video in the Last Year: Not on file  . Ran Out of Food in the Last Year: Not on file  Transportation Needs:   . Lack of Transportation (Medical): Not on file  . Lack of Transportation (Non-Medical): Not on file  Physical Activity:   . Days of Exercise per Week: Not on file  . Minutes of Exercise per Session: Not on file  Stress:   . Feeling of Stress : Not on file  Social Connections:   . Frequency of Communication with Friends and Family: Not on file  . Frequency of Social Gatherings with Friends and Family: Not on file  . Attends Religious Services: Not on file  . Active Member of Clubs or Organizations: Not on file  . Attends Banker Meetings: Not on file  . Marital Status: Not on file    Hospital Course:    [name] was admitted under routine precautions and unlike previous days he was generally calm and nonthreatening.  He always denied positive symptoms such as hallucinations but rather displayed predominantly negative symptoms  He did state to himself he did receive his long-acting injectable on the 16th, which seemed to be holding him pretty well when he was in his system.  By the date of the 22nd he had been monitored long enough he was noted on rounds to be alert, fully oriented and cooperative, no longer focused on his weight.  He denied auditory or visual hallucinations.  Further he denied thoughts of harming himself harming others  including his mother.  He had no involuntary movements.  His mother felt there might be a component of depression given his lack of motivation so forth but I think he is simply suffering from prominent negative symptoms on this admission rather than prominent positive symptoms.  We will follow-up with the [place] division of [facility]  Physical Findings: AIMS: Facial and Oral Movements Muscles of Facial Expression: None, normal Lips and Perioral Area: None, normal Jaw: None, normal Tongue: None, normal,Extremity Movements Upper (arms, wrists, hands, fingers): None, normal Lower (legs, knees, ankles, toes): None, normal, Trunk Movements Neck, shoulders, hips: None, normal, Overall Severity Severity of abnormal movements (highest score from questions above): None, normal Incapacitation due to abnormal movements: None, normal Patient's awareness of abnormal movements (rate only patient's report): No Awareness, Dental Status Current problems with teeth and/or dentures?: No Does patient usually wear dentures?: No  CIWA:    COWS:     Musculoskeletal: Strength & Muscle Tone: within normal  limits Gait & Station: normal Patient leans: N/A  Psychiatric Specialty Exam: Physical Exam  Review of Systems  Blood pressure 130/60, pulse (!) 54, temperature 98.1 F (36.7 C), temperature source Oral, resp. rate 16, height 5\' 8"  (1.727 m), weight 70.3 kg, SpO2 100 %.Body mass index is 23.57 kg/m.  General Appearance: Casual  Eye Contact:  Good  Speech:  Clear and Coherent  Volume:  Normal  Mood:  Euthymic  Affect:  Constricted  Thought Process:  Coherent and Goal Directed  Orientation:  Full (Time, Place, and Person)  Thought Content:  Denies auditory or visual hallucinations no paranoid discerned and denies wanting to harm self or others  Suicidal Thoughts:  No  Homicidal Thoughts:  No  Memory:  Immediate;   Fair Recent;   Fair Remote;   Fair  Judgement:  Fair  Insight:   Fair  Psychomotor Activity:  Normal  Concentration:  Concentration: Fair and Attention Span: Fair  Recall:  [employer] of Knowledge:  Fair  Language:  Fair  Akathisia:  Negative  Handed:  Right  AIMS (if indicated):     Assets:  Communication Skills Desire for Improvement  ADL's:  Intact  Cognition:  WNL  Sleep:  Number of Hours: 10.25      Have you used any form of tobacco in the last 30 days? (Cigarettes, Smokeless Tobacco, Cigars, and/or Pipes): Yes  Has this patient used any form of tobacco in the last 30 days? (Cigarettes, Smokeless Tobacco, Cigars, and/or Pipes) Yes, No  Blood Alcohol level:  Lab Results  Component Value Date   ETH <10 04/27/2019   ETH <10 97/35/3299    Metabolic Disorder Labs:  Lab Results  Component Value Date   HGBA1C 5.4 02/28/2019   MPG 108 02/28/2019   MPG 108 06/05/2016   Lab Results  Component Value Date   PROLACTIN 37.6 (H) 02/28/2019   PROLACTIN 32.7 (H) 02/22/2013   Lab Results  Component Value Date   CHOL 178 02/28/2019   TRIG 104 02/28/2019   HDL 60 02/28/2019   CHOLHDL 3.0 02/28/2019   VLDL 21 02/28/2019   [place] 97 02/28/2019   [place] 85 06/05/2016    See Psychiatric Specialty Exam and Suicide Risk Assessment completed by Attending Physician prior to discharge.  Discharge destination:  Home  Is patient on multiple antipsychotic therapies at discharge:  No   Has Patient had three or more failed trials of antipsychotic monotherapy by history:  No  Recommended Plan for Multiple Antipsychotic Therapies: NA   Allergies as of 05/04/2019      Reactions   Clozapine Hives, Other (See Comments)   Increased blood pressure   Prolixin [fluphenazine] Other (See Comments)   Hallucinations   Risperdal [risperidone] Other (See Comments)   Unknown      Medication List    STOP taking these medications   diphenhydrAMINE 50 MG capsule Commonly known as: BENADRYL     TAKE these medications     Indication  benztropine 1  MG tablet Commonly known as: COGENTIN Take 1 tablet (1 mg total) by mouth 2 (two) times daily.  Indication: Extrapyramidal Reaction caused by Medications   gabapentin 300 MG capsule Commonly known as: NEURONTIN Take 1 capsule (300 mg total) by mouth 3 (three) times daily.  Indication: Neuropathic Pain   haloperidol decanoate 50 MG/ML injection Commonly known as: HALDOL DECANOATE Inject 4 mLs (200 mg total) into the muscle every 28 (twenty-eight) days. Due 05/29/2019 What changed: additional instructions  Indication:  Schizophrenia   lithium carbonate 450 MG CR tablet Commonly known as: ESKALITH Take 1 tablet (450 mg total) by mouth every 12 (twelve) hours. What changed:   how much to take  how to take this  when to take this  additional instructions  Indication: Hypomanic Episode of Bipolar Disorder, Schizoaffective Disorder   topiramate 50 MG tablet Commonly known as: TOPAMAX Take 1 tablet (50 mg total) by mouth 2 (two) times daily.  Indication: Antipsychotic Therapy-Induced Weight Gain   traZODone 150 MG tablet Commonly known as: DESYREL Take 1 tablet (150 mg total) by mouth at bedtime.  Indication: Trouble Sleeping      Follow-up Information    [facility] Follow up.   Contact information: [address] [place] [address] [phone]           Signed: Malvin Johns, MD 05/04/2019, 9:52 AM

## 2020-01-09 IMAGING — DX PORTABLE CHEST - 1 VIEW
1 series · 1 of 1 positions shown · non-contrast
Comparison: February 22, 2015

CLINICAL DATA: Shortness of breath

EXAM:
PORTABLE CHEST 1 VIEW

[chest ap]
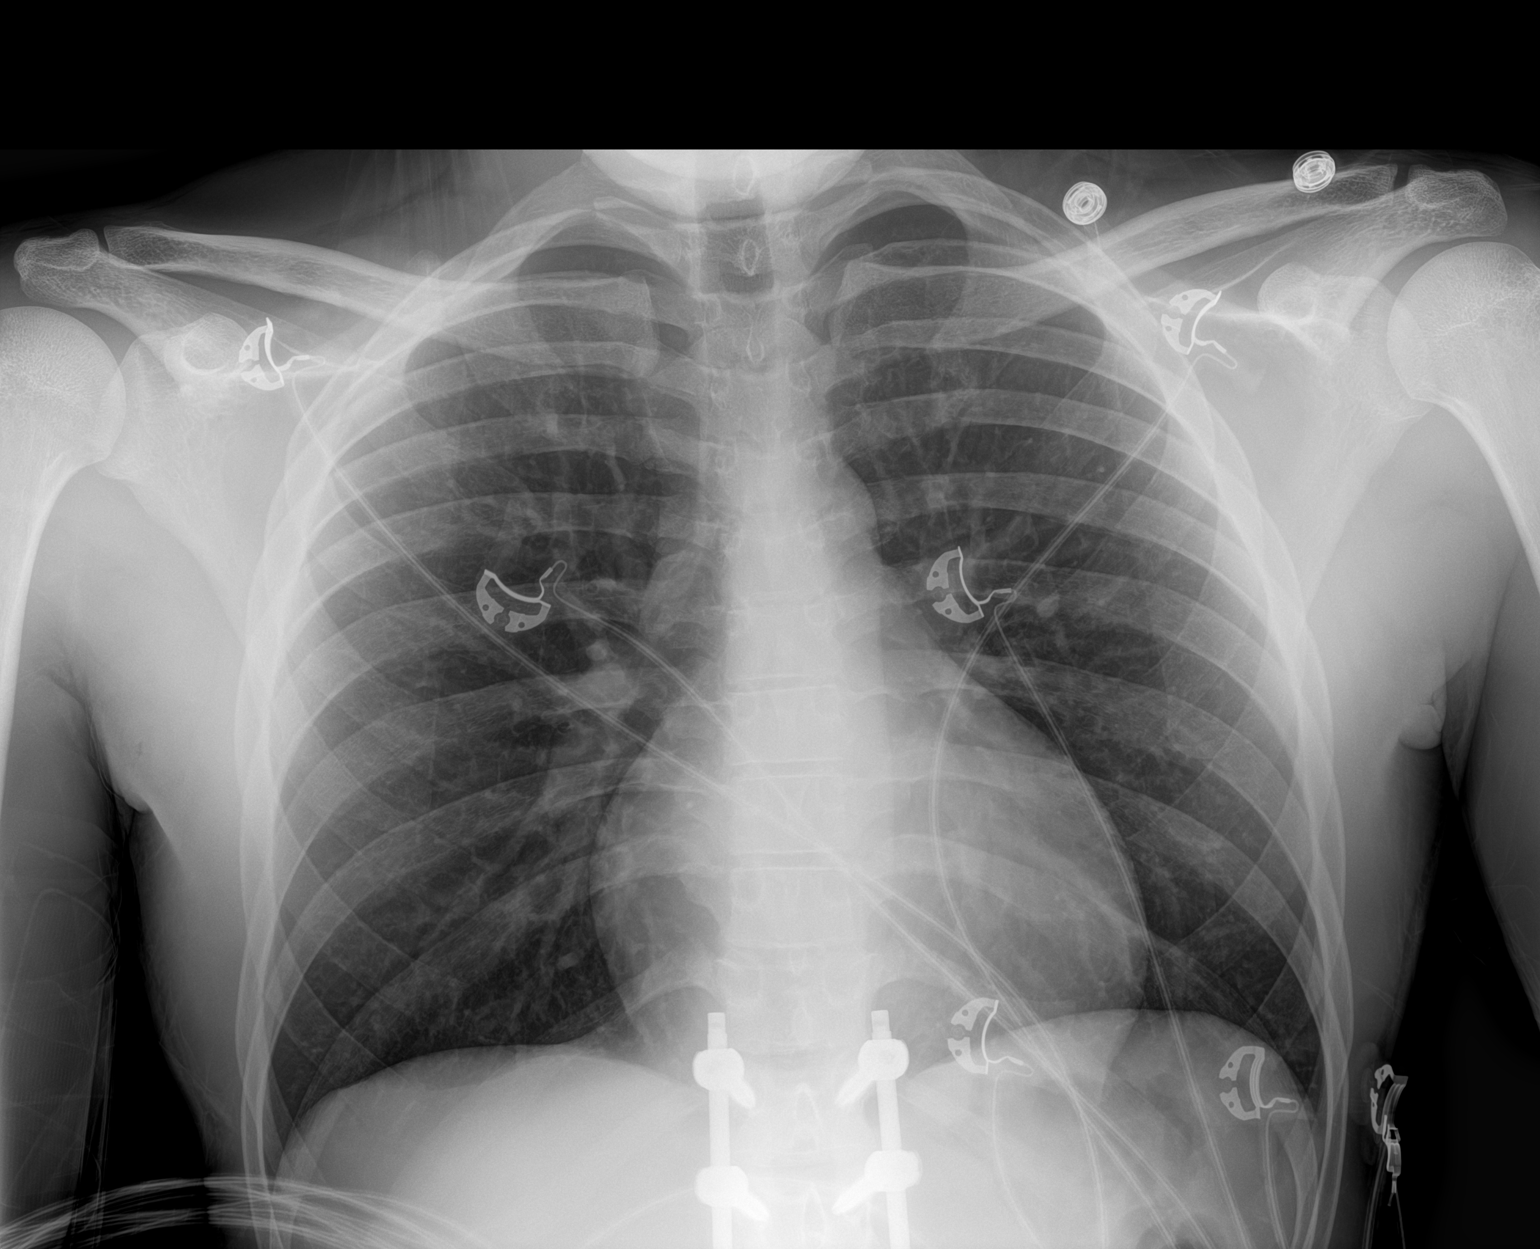

[1 of 1 positions shown; findings below may reference images not displayed]

FINDINGS: Lungs are clear. Heart size and pulmonary vascularity are normal. No
adenopathy. Postoperative change noted in the lower thoracic and
visualized upper lumbar regions.
IMPRESSION: No edema or consolidation.  Cardiac silhouette within normal limits.

## 2020-01-18 ENCOUNTER — Encounter: Payer: Self-pay | Admitting: Emergency Medicine

## 2020-01-18 ENCOUNTER — Other Ambulatory Visit: Payer: Self-pay

## 2020-01-18 DIAGNOSIS — F649 Gender identity disorder, unspecified: Secondary | ICD-10-CM | POA: Insufficient documentation

## 2020-01-18 DIAGNOSIS — R269 Unspecified abnormalities of gait and mobility: Secondary | ICD-10-CM | POA: Diagnosis not present

## 2020-01-18 DIAGNOSIS — Z5321 Procedure and treatment not carried out due to patient leaving prior to being seen by health care provider: Secondary | ICD-10-CM | POA: Insufficient documentation

## 2020-01-18 NOTE — ED Triage Notes (Signed)
Patient ambulatory to triage with steady gait, without difficulty or distress noted; pt reports he called the police to bring him in; pt denies any SI or HI, only st that he is having an "identitiy crisis, as if he is being broken down again and again"; pt st he has a psychiatrist but they are "not useful"; pt refuses protocols and dress out at this time, st "my religion does not allow the shedding of blood"

## 2020-01-19 ENCOUNTER — Emergency Department
Admission: EM | Admit: 2020-01-19 | Discharge: 2020-01-19 | Disposition: A | Discharge status: Home / Self Care | Payer: BC Managed Care – PPO | Attending: Emergency Medicine | Admitting: Emergency Medicine

## 2020-01-19 NOTE — ED Notes (Signed)
Pt states he does not want to stay at this time, pt has his belongings and is not dressed out. Pt denies any SI/HI. Pt ambulatory without difficulty.

## 2020-01-21 ENCOUNTER — Telehealth: Payer: Self-pay | Admitting: Emergency Medicine

## 2020-01-21 NOTE — Telephone Encounter (Signed)
Called Joi--mom who is legal guardian, as she was not notified during patients visit.  She says she has seen him since and he has been ok and act team is available.

## 2020-02-03 ENCOUNTER — Other Ambulatory Visit: Payer: Self-pay

## 2020-02-03 ENCOUNTER — Emergency Department
Admission: EM | Admit: 2020-02-03 | Discharge: 2020-02-04 | Disposition: A | Discharge status: Home / Self Care | Payer: BC Managed Care – PPO | Attending: Emergency Medicine | Admitting: Emergency Medicine

## 2020-02-03 ENCOUNTER — Encounter: Payer: Self-pay | Admitting: Emergency Medicine

## 2020-02-03 DIAGNOSIS — F17210 Nicotine dependence, cigarettes, uncomplicated: Secondary | ICD-10-CM | POA: Diagnosis not present

## 2020-02-03 DIAGNOSIS — F1910 Other psychoactive substance abuse, uncomplicated: Secondary | ICD-10-CM | POA: Diagnosis not present

## 2020-02-03 DIAGNOSIS — F319 Bipolar disorder, unspecified: Secondary | ICD-10-CM

## 2020-02-03 DIAGNOSIS — F209 Schizophrenia, unspecified: Secondary | ICD-10-CM | POA: Diagnosis present

## 2020-02-03 DIAGNOSIS — F29 Unspecified psychosis not due to a substance or known physiological condition: Secondary | ICD-10-CM

## 2020-02-03 LAB — COMPREHENSIVE METABOLIC PANEL
ALT: 17 U/L (ref 0–44)
AST: 25 U/L (ref 15–41)
Albumin: 5.1 g/dL — ABNORMAL HIGH (ref 3.5–5.0)
Alkaline Phosphatase: 79 U/L (ref 38–126)
Anion gap: 13 (ref 5–15)
BUN: 10 mg/dL (ref 6–20)
CO2: 27 mmol/L (ref 22–32)
Calcium: 10 mg/dL (ref 8.9–10.3)
Chloride: 97 mmol/L — ABNORMAL LOW (ref 98–111)
Creatinine, Ser: 0.78 mg/dL (ref 0.61–1.24)
GFR calc Af Amer: >60 mL/min (ref >60)
GFR calc non Af Amer: >60 mL/min (ref >60)
Glucose, Bld: 90 mg/dL (ref 70–99)
Potassium: 4.2 mmol/L (ref 3.5–5.1)
Sodium: 137 mmol/L (ref 135–145)
Total Bilirubin: 1 mg/dL (ref 0.3–1.2)
Total Protein: 9 g/dL — ABNORMAL HIGH (ref 6.5–8.1)

## 2020-02-03 LAB — CBC
HCT: 52 % (ref 39.0–52.0)
Hemoglobin: 17 g/dL (ref 13.0–17.0)
MCH: 27 pg (ref 26.0–34.0)
MCHC: 32.7 g/dL (ref 30.0–36.0)
MCV: 82.7 fL (ref 80.0–100.0)
Platelets: 248 10*3/uL (ref 150–400)
RBC: 6.29 MIL/uL — ABNORMAL HIGH (ref 4.22–5.81)
RDW: 13.4 % (ref 11.5–15.5)
WBC: 4.9 10*3/uL (ref 4.0–10.5)
nRBC: 0 % (ref 0.0–0.2)

## 2020-02-03 LAB — ETHANOL: Alcohol, Ethyl (B): <10 mg/dL (ref <10)

## 2020-02-03 LAB — SALICYLATE LEVEL: Salicylate Lvl: <7 mg/dL — ABNORMAL LOW (ref 7.0–30.0)

## 2020-02-03 LAB — ACETAMINOPHEN LEVEL: Acetaminophen (Tylenol), Serum: <10 ug/mL — ABNORMAL LOW (ref 10–30)

## 2020-02-03 NOTE — ED Notes (Signed)
Hourly rounding reveals patient in room. No complaints, stable, in no acute distress. Q15 minute rounds and monitoring via Rover and Officer to continue.   

## 2020-02-03 NOTE — ED Provider Notes (Signed)
[facility] Emergency Department Provider Note ____________________________________________   First MD Initiated Contact with Patient 02/03/20 2116     (approximate)  I have reviewed the triage vital signs and the nursing notes.   HISTORY  Chief Complaint Psychiatric Evaluation  Level 5 caveat: History present illness limited due to disorganized historian  HPI [name] is a 24 y.o. male with PMH as noted below including schizophrenia who presents voluntarily with symptoms occurring after recent drug use.  The patient states that he was using crystal meth, most recently 2 days ago, and states that he feels weird.  He is somewhat vague about exactly what is bothering him, although denies specifically hearing voices, and denies any thoughts of wanting to hurt himself or others.  He states that he mainly would like to rest.  He denies any physical complaints.  He denies other drug use, and states that he is not on any psychiatric medications currently.  Past Medical History:  Diagnosis Date  . ADD (attention deficit disorder)   . 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 Anemia   . Anxiety   . Bipolar disorder (HCC)   . Borderline personality disorder (HCC)   . Central auditory processing disorder   . Deliberate self-cutting   . Depressed   . Eczema   . Headache(784.0)   . Oppositional defiant disorder   . Schizophrenia [facility])     Patient Active Problem List   Diagnosis Date Noted  . Bipolar 1 disorder (HCC) 04/27/2019  . Schizophrenia (HCC) 02/24/2019  . Schizoaffective disorder (HCC) 02/22/2019  . Suicidal ideations   . Substance induced mood disorder (HCC) 03/10/2018  . Paranoid schizophrenia (HCC)   . Cannabis use disorder, moderate, dependence (HCC) 06/05/2016  . Eating disorder Unspecified 05/22/2016  . Borderline personality disorder (HCC) 05/16/2016  . Tobacco use disorder 05/15/2016     Past Surgical History:  Procedure Laterality Date  . BACK SURGERY    . FRACTURE SURGERY    . NO PAST SURGERIES    . POSTERIOR LUMBAR FUSION N/A 02/19/2015   Procedure: LATERAL L-2 CORPECTOMY;  Surgeon: Lisbeth Renshaw, MD;  Location: [facility] OR;  Service: Neurosurgery;  Laterality: N/A;  . POSTERIOR LUMBAR FUSION 4 LEVEL  02/19/2015   Procedure: Posterior T-12 - L-4 STABILIZATION OF POSTERIOR LUMBAR;  Surgeon: Lisbeth Renshaw, MD;  Location: [facility] OR;  Service: Neurosurgery;;  . TIBIA IM NAIL INSERTION Left 02/20/2015   Procedure: INTRAMEDULLARY (IM) NAIL LEFT TIBIAL;  Surgeon: [name], MD;  Location: [facility] OR;  Service: Orthopedics;  Laterality: Left;    Prior to Admission medications   Medication Sig Start Date End Date Taking? Authorizing Provider  benztropine (COGENTIN) 1 MG tablet Take 1 tablet (1 mg total) by mouth 2 (two) times daily. Patient not taking: Reported on 02/03/2020 05/04/19   Malvin Johns, MD  gabapentin (NEURONTIN) 300 MG capsule Take 1 capsule (300 mg total) by mouth 3 (three) times daily. Patient not taking: Reported on 02/03/2020 05/04/19   Malvin Johns, MD  haloperidol decanoate (HALDOL DECANOATE) 50 MG/ML injection Inject 4 mLs (200 mg total) into the muscle every 28 (twenty-eight) days. Due 05/29/2019 Patient not taking: Reported on 02/03/2020 05/04/19   Malvin Johns, MD  lithium carbonate (ESKALITH) 450 MG CR tablet Take 1 tablet (450 mg total) by mouth every 12 (twelve) hours. Patient not taking: Reported on 02/03/2020 05/04/19   Malvin Johns, MD  topiramate (TOPAMAX) 50 MG tablet Take 1 tablet (50 mg total)  by mouth 2 (two) times daily. Patient not taking: Reported on 02/03/2020 05/04/19   Malvin Johns, MD  traZODone (DESYREL) 150 MG tablet Take 1 tablet (150 mg total) by mouth at bedtime. Patient not taking: Reported on 02/03/2020 03/09/19   Malvin Johns, MD    Allergies Clozapine, Prolixin [fluphenazine], and Risperdal [risperidone]  History reviewed. No  pertinent family history.  Social History Social History   Tobacco Use  . Smoking status: Current Every Day Smoker    Packs/day: 2.00    Types: Cigarettes  . Smokeless tobacco: Never Used  . Tobacco comment: 1-3 packs   Vaping Use  . Vaping Use: Some days  Substance Use Topics  . Alcohol use: Yes    Alcohol/week: 2.0 standard drinks    Types: 2 Shots of liquor per week  . Drug use: Yes    Types: Cocaine, Marijuana, Benzodiazepines, Heroin    Comment: heroin    Review of Systems  Constitutional: No fever/chills. Eyes: No visual changes. ENT: No sore throat. Cardiovascular: Denies chest pain. Respiratory: Denies shortness of breath. Gastrointestinal: No vomiting or diarrhea.  Genitourinary: Negative for dysuria.  Musculoskeletal: Negative for back pain. Skin: Negative for rash. Neurological: Negative for headache.   ____________________________________________   PHYSICAL EXAM:  VITAL SIGNS: ED Triage Vitals  Enc Vitals Group     BP 02/03/20 2155 112/75     Pulse Rate 02/03/20 2155 (!) 56     Resp 02/03/20 2155 18     Temp 02/03/20 2155 98 F (36.7 C)     Temp Source 02/03/20 2155 Oral     SpO2 02/03/20 2155 100 %     Weight 02/03/20 2108 125 lb (56.7 kg)     Height 02/03/20 2108 5\' 8"  (1.727 m)     Head Circumference --      Peak Flow --      Pain Score 02/03/20 2107 0     Pain Loc --      Pain Edu? --      Excl. in GC? --     Constitutional: Alert and oriented. Well appearing and in no acute distress. Eyes: Conjunctivae are normal.  Head: Atraumatic. Nose: No congestion/rhinnorhea. Mouth/Throat: Mucous membranes are moist.   Neck: Normal range of motion.  Cardiovascular: Normal rate, regular rhythm. Good peripheral circulation. Respiratory: Normal respiratory effort.  No retractions.  Gastrointestinal: No distention.  Musculoskeletal: Extremities warm and well perfused.  Neurologic:  Normal speech and language. No gross focal neurologic deficits  are appreciated.  Skin:  Skin is warm and dry. No rash noted. Psychiatric: Somewhat flat affect.  Organized thought, although somewhat tangential and answers.  Answering questions properly, does not appear to be responding to internal stimuli.  ____________________________________________   LABS (all labs ordered are listed, but only abnormal results are displayed)  Labs Reviewed  SALICYLATE LEVEL - Abnormal; Notable for the following components:      Result Value   Salicylate Lvl <7.0 (*)    All other components within normal limits  ACETAMINOPHEN LEVEL - Abnormal; Notable for the following components:   Acetaminophen (Tylenol), Serum <10 (*)    All other components within normal limits  CBC - Abnormal; Notable for the following components:   RBC 6.29 (*)    All other components within normal limits  ETHANOL  COMPREHENSIVE METABOLIC PANEL  URINE DRUG SCREEN, QUALITATIVE ([facility] ONLY)   ____________________________________________  EKG   ____________________________________________  RADIOLOGY    ____________________________________________   PROCEDURES  Procedure(s) performed: No  Procedures  Critical Care performed: No ____________________________________________   INITIAL IMPRESSION / ASSESSMENT AND PLAN / ED COURSE  Pertinent labs & imaging results that were available during my care of the patient were reviewed by me and considered in my medical decision making (see chart for details).  24 year old male with PMH as noted above including a history of schizophrenia presents voluntarily, with concerns that he is feeling weird after using crystal meth a few days ago.  He denies any specific complaints, and does not have a clear answer when I ask about his expectations for an ED visit or how we could help him.  He states he mainly wants to rest.  He denies SI or HI.  On exam, he is calm, alert, and oriented.  His vital signs are normal.  Physical exam is  unremarkable.  He is overall coherent and organized in his thoughts, attempting to answer my questions appropriately, but somewhat tangential and vague in his answers.  I reviewed the past medical records in epic.  The patient came to the ED on 9/7 but left prior to being evaluated.  Per telephone follow-up, he was doing well few days later.  He was last admitted in December of last year due to worsening schizophrenia, primarily negative symptoms rather than hallucinations or other positive symptoms.  Overall presentation is consistent with symptoms related to the patient's schizophrenia.  He does not appear that he is compliant with medications.  I think that this could have been exacerbated by his recent drug use.  However, at this time, the patient does not appear acutely psychotic.  He is overall appropriate, calm, and denies any thoughts of self-harm.  There is no evidence of immediate danger to self or others, so I will not place patient under IVC at this time.  I have ordered psychiatry consultation, which he agrees to.  ----------------------------------------- 11:09 PM on 02/03/2020 -----------------------------------------  I signed the patient out to the oncoming physician Dr. Don Perking.  ____________________________________________   FINAL CLINICAL IMPRESSION(S) / ED DIAGNOSES  Final diagnoses:  Substance abuse (HCC)      NEW MEDICATIONS STARTED DURING THIS VISIT:  New Prescriptions   No medications on file     Note:  This document was prepared using Dragon voice recognition software and may include unintentional dictation errors.    Dionne Bucy, MD 02/03/20 2310

## 2020-02-03 NOTE — ED Notes (Signed)
Pt. Was dress out by this tech and was provided with hospital attire (burgundy scrub top and bottom) under and socks were provided as well. Pt belonging were placed in bag that was provided with his name and label.  Pt belongings were:   Black pants Black shorts Black pair of socks Black and tan stripped T-shirt Black slippers

## 2020-02-03 NOTE — BH Assessment (Addendum)
At 10:12 PM this writer attempted to contact pt's mother/legal guardian [name]  [phone])  for collateral but was unable to get through. A HIPPA compliant message was left, requesting a return phone call.
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
## 2020-02-03 NOTE — ED Notes (Signed)
Pt given cranberry juice and apple sauce.

## 2020-02-03 NOTE — ED Triage Notes (Signed)
Pt to ED via EMS from home c/o psychiatric evaluation.  Comes voluntary.  Denies SI/HI at this time, states "smoked a crystal" at a party a couple days ago and is wondering if this weird feeling is the drug still in his system or a psychiatric reason.  Pt calm, withdrawn, cooperative.  Pt speaking softly and given some vague answers.

## 2020-02-04 NOTE — ED Notes (Signed)
Hourly rounding reveals patient in room. No complaints, stable, in no acute distress. Q15 minute rounds and monitoring via Rover and Officer to continue.   

## 2020-02-04 NOTE — ED Provider Notes (Addendum)
Patient seen by psychiatry   Arnaldo Natal, MD 02/04/20 1444 Computer cut off last part of the note.  The whole note was supposed to say patient seen by psychiatry and cleared to go.  We will let him go.   Arnaldo Natal, MD 02/04/20 [id number]

## 2020-02-04 NOTE — ED Notes (Signed)
Pt reports his mothers phone number is [phone]

## 2020-02-04 NOTE — ED Notes (Signed)
Hourly rounding reveals patient asleep in room. No complaints, stable, in no acute distress. Q15 minute rounds and monitoring via [employer] Cam[employer] to continue. 

## 2020-02-04 NOTE — Discharge Instructions (Addendum)
Please try to avoid drug use.  This will make your mental state worse.  Please follow-up with [facility].  Their contact information is on the other piece of paper we gave you.  Please continue to use the act team.  Please return for any further problems.

## 2020-02-04 NOTE — ED Provider Notes (Signed)
Emergency Medicine Observation Re-evaluation Note  [name] is a 24 y.o. male, seen on rounds today.  He came to the emergency room after using crystal meth.  He said he felt weird. Physical Exam  BP 112/75 (BP Location: Right Arm)   Pulse (!) 56   Temp 98 F (36.7 C) (Oral)   Resp 18   Ht 5\' 8"  (1.727 m)   Wt 56.7 kg   SpO2 100%   BMI 19.01 kg/m  Physical Exam General: Patient alert and awake walking to the bathroom to urinate does not communicate with anybody walks right by me and the nurse when we asked him for a urine specimen Lungs: Patient with no retractions no respiratory distress Psych: Patient very distant but not agitated at this time  ED Course / MDM  EKG:    I have reviewed the labs performed to date as well as medications administered while in observation.   Plan  Psych consult has been placed to awaiting psychiatric evaluation   , MD 02/04/20 [phone]

## 2020-02-04 NOTE — ED Notes (Signed)
Psychiatrist is at his bedside
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
## 2020-02-04 NOTE — ED Notes (Addendum)
Pt. Transferred to BHU from ED to room 1 after screening for contraband. Report to include Situation, Background, Assessment and Recommendations from [facility], RN. Pt. Oriented to unit including Q15 minute rounds as well as the security cameras for their protection. Patient is alert and oriented, warm and dry in no acute distress. Patient denies SI, HI, and AVH. Pt. Encouraged to let this nurse know if needs arise. Pt provided with change of clothes at this time after urinating on self, pt educated on need for urine sample when able to provide one.

## 2020-02-15 DIAGNOSIS — F1590 Other stimulant use, unspecified, uncomplicated: Secondary | ICD-10-CM | POA: Insufficient documentation

## 2020-02-15 DIAGNOSIS — F209 Schizophrenia, unspecified: Secondary | ICD-10-CM | POA: Insufficient documentation

## 2020-02-15 DIAGNOSIS — F200 Paranoid schizophrenia: Secondary | ICD-10-CM | POA: Diagnosis not present

## 2020-02-15 DIAGNOSIS — F17210 Nicotine dependence, cigarettes, uncomplicated: Secondary | ICD-10-CM | POA: Diagnosis not present

## 2020-02-15 DIAGNOSIS — Z046 Encounter for general psychiatric examination, requested by authority: Secondary | ICD-10-CM | POA: Diagnosis present

## 2020-02-15 DIAGNOSIS — Z20822 Contact with and (suspected) exposure to covid-19: Secondary | ICD-10-CM | POA: Diagnosis not present

## 2020-02-16 ENCOUNTER — Encounter: Payer: Self-pay | Admitting: Emergency Medicine

## 2020-02-16 ENCOUNTER — Emergency Department
Admission: EM | Admit: 2020-02-16 | Discharge: 2020-02-17 | Disposition: A | Discharge status: Other Acute Inpatient Hospital | Payer: BC Managed Care – PPO | Attending: Emergency Medicine | Admitting: Emergency Medicine

## 2020-02-16 ENCOUNTER — Other Ambulatory Visit: Payer: Self-pay

## 2020-02-16 DIAGNOSIS — F17200 Nicotine dependence, unspecified, uncomplicated: Secondary | ICD-10-CM | POA: Diagnosis present

## 2020-02-16 DIAGNOSIS — R443 Hallucinations, unspecified: Secondary | ICD-10-CM

## 2020-02-16 DIAGNOSIS — F200 Paranoid schizophrenia: Secondary | ICD-10-CM | POA: Diagnosis present

## 2020-02-16 DIAGNOSIS — F209 Schizophrenia, unspecified: Secondary | ICD-10-CM | POA: Diagnosis not present

## 2020-02-16 DIAGNOSIS — F603 Borderline personality disorder: Secondary | ICD-10-CM | POA: Diagnosis present

## 2020-02-16 DIAGNOSIS — R45851 Suicidal ideations: Secondary | ICD-10-CM

## 2020-02-16 DIAGNOSIS — F259 Schizoaffective disorder, unspecified: Secondary | ICD-10-CM | POA: Diagnosis present

## 2020-02-16 DIAGNOSIS — F1220 Cannabis dependence, uncomplicated: Secondary | ICD-10-CM | POA: Diagnosis present

## 2020-02-16 DIAGNOSIS — F1210 Cannabis abuse, uncomplicated: Secondary | ICD-10-CM | POA: Diagnosis present

## 2020-02-16 DIAGNOSIS — F1994 Other psychoactive substance use, unspecified with psychoactive substance-induced mood disorder: Secondary | ICD-10-CM | POA: Diagnosis present

## 2020-02-16 DIAGNOSIS — F319 Bipolar disorder, unspecified: Secondary | ICD-10-CM | POA: Diagnosis present

## 2020-02-16 DIAGNOSIS — F509 Eating disorder, unspecified: Secondary | ICD-10-CM | POA: Diagnosis present

## 2020-02-16 LAB — COMPREHENSIVE METABOLIC PANEL
ALT: 16 U/L (ref 0–44)
AST: 25 U/L (ref 15–41)
Albumin: 4.4 g/dL (ref 3.5–5.0)
Alkaline Phosphatase: 68 U/L (ref 38–126)
Anion gap: 9 (ref 5–15)
BUN: 12 mg/dL (ref 6–20)
CO2: 29 mmol/L (ref 22–32)
Calcium: 9.4 mg/dL (ref 8.9–10.3)
Chloride: 103 mmol/L (ref 98–111)
Creatinine, Ser: 0.81 mg/dL (ref 0.61–1.24)
GFR calc non Af Amer: >60 mL/min (ref >60)
Glucose, Bld: 88 mg/dL (ref 70–99)
Potassium: 3.5 mmol/L (ref 3.5–5.1)
Sodium: 141 mmol/L (ref 135–145)
Total Bilirubin: 0.7 mg/dL (ref 0.3–1.2)
Total Protein: 7.5 g/dL (ref 6.5–8.1)

## 2020-02-16 LAB — URINE DRUG SCREEN, QUALITATIVE (ARMC ONLY)
Amphetamines, Ur Screen: NOT DETECTED
Barbiturates, Ur Screen: NOT DETECTED
Benzodiazepine, Ur Scrn: NOT DETECTED
Cannabinoid 50 Ng, Ur ~~LOC~~: NOT DETECTED
Cocaine Metabolite,Ur ~~LOC~~: NOT DETECTED
MDMA (Ecstasy)Ur Screen: NOT DETECTED
Methadone Scn, Ur: NOT DETECTED
Opiate, Ur Screen: NOT DETECTED
Phencyclidine (PCP) Ur S: NOT DETECTED
Tricyclic, Ur Screen: NOT DETECTED

## 2020-02-16 LAB — RESPIRATORY PANEL BY RT PCR (FLU A&B, COVID)
Influenza A by PCR: NEGATIVE
Influenza B by PCR: NEGATIVE
SARS Coronavirus 2 by RT PCR: NEGATIVE

## 2020-02-16 LAB — CBC
HCT: 44.7 % (ref 39.0–52.0)
Hemoglobin: 15 g/dL (ref 13.0–17.0)
MCH: 27.4 pg (ref 26.0–34.0)
MCHC: 33.6 g/dL (ref 30.0–36.0)
MCV: 81.6 fL (ref 80.0–100.0)
Platelets: 267 10*3/uL (ref 150–400)
RBC: 5.48 MIL/uL (ref 4.22–5.81)
RDW: 14 % (ref 11.5–15.5)
WBC: 5.8 10*3/uL (ref 4.0–10.5)
nRBC: 0 % (ref 0.0–0.2)

## 2020-02-16 LAB — SALICYLATE LEVEL: Salicylate Lvl: <7 mg/dL — ABNORMAL LOW (ref 7.0–30.0)

## 2020-02-16 LAB — ETHANOL: Alcohol, Ethyl (B): <10 mg/dL (ref <10)

## 2020-02-16 LAB — ACETAMINOPHEN LEVEL: Acetaminophen (Tylenol), Serum: <10 ug/mL — ABNORMAL LOW (ref 10–30)

## 2020-02-16 MED ORDER — ZIPRASIDONE MESYLATE 20 MG IM SOLR
20.0000 mg | Freq: Once | INTRAMUSCULAR | Status: AC
  Administered 2020-02-16: 20 mg via INTRAMUSCULAR
  Filled 2020-02-16: qty 20

## 2020-02-16 NOTE — ED Notes (Signed)
TTS at bedside. 

## 2020-02-16 NOTE — ED Notes (Signed)
ENVIRONMENTAL ASSESSMENT Potentially harmful objects out of patient reach: Yes.   Personal belongings secured: Yes.   Patient dressed in hospital provided attire only: Yes.   Plastic bags out of patient reach: Yes.   Patient care equipment (cords, cables, call bells, lines, and drains) shortened, removed, or accounted for: Yes.   Equipment and supplies removed from bottom of stretcher: Yes.   Potentially toxic materials out of patient reach: Yes.   Sharps container removed or out of patient reach: Yes.     Marland Kitchenks
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
## 2020-02-16 NOTE — ED Notes (Signed)
Patient requested a shower, but just went in and brushed teeth, washed faced, and changed clothes.

## 2020-02-16 NOTE — ED Notes (Signed)
Hourly rounding reveals patient in room. No complaints, stable, in no acute distress. Q15 minute rounds and monitoring via Security Cameras to continue. 
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
## 2020-02-16 NOTE — ED Notes (Signed)
Pt transferred into ED BHU room 1   Patient assigned to appropriate care area. Patient oriented to unit/care area: Informed that, for his safety, care areas are designed for safety and monitored by security cameras at all times; Visiting hours and phone times explained to patient. Patient verbalizes understanding, and verbal contract for safety obtained.   Assessment completed  He denies pain   

## 2020-02-16 NOTE — BH Assessment (Addendum)
Writer followed up with referrals for patient.  Referral information for Psychiatric Hospitalization faxed to;    [name] [phone]), unable to reach anyone.   [name] ([phone]---[phone]), Unable to reach anyone.   [name] ([name]-[phone]), advised to refax. Have a stack of referrals that haven't been reviewed.   [name] [phone] -[phone]), Pending review. No male beds for today.

## 2020-02-16 NOTE — BH Assessment (Addendum)
Patient has been accepted to [facility].  Patient assigned to [facility] Accepting physician is Dr. Estill Cotta.  Call report to [phone].  Representative was [employer].  ER Staff is aware of it:  Nitchia, ER Secretary  Dr. Scotty Court, ER MD  Amy T., Patient's Nurse  Patient's legal guardian Raphael Gibney [phone]) have been updated as well.  Address: [address] [place], [place] [address]   Bed will be available tomorrow (02/17/2020) after 8am.

## 2020-02-16 NOTE — ED Notes (Addendum)
Per mom (legal guardian), pt was fine last night when they spoke on the phone. Mom ordered pt pizza. Mom concerned for drug use. Mom also requesting specifically for pt to be faxed out to [facility]. Mom would also like to be contacted before any medications are started.

## 2020-02-16 NOTE — ED Notes (Addendum)
Pt comes out of shower without being wet or changing clothes. Pt did not shower. Pt used wash cloth on face and brushed his teeth. Pt goes back into room and continues talking to himself.

## 2020-02-16 NOTE — ED Notes (Signed)
Patient refused vital signs this morning.

## 2020-02-16 NOTE — ED Notes (Signed)
Pt refused to speak to psych. Rolled over when they entered room.
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
## 2020-02-16 NOTE — BH Assessment (Signed)
Referral information for Psychiatric Hospitalization faxed to;   . [facility] ([phone]-or- [phone]),   . [facility] ([phone]---[phone]---[phone]),  . [facility] ([phone]),   . [facility] ([phone] -or- [phone]),    

## 2020-02-16 NOTE — BH Assessment (Signed)
Writer spoke with the patient to complete an updated/reassessment. Patient denies SI/HI and AV/H. However, prior to writer speaking with the patient, he witnessed patient responding to internal stimuli. He requested to take a shower. He was in the bathroom for a short period of time. When he came out, he said he was done but he nor his towel was wet. When asked about it, he said he had taking a shower. While speaking with the patient, he was initially guarded and withdrawn. When answering questions, he would pause and it appeared to either listening or talking to someone else because his lips would move and speake barely above a whisper. Writer was unable to hear what he was saying, when this occurred. When talking directly with writer, he was loud and clear.

## 2020-02-16 NOTE — ED Notes (Signed)
IVC, accepted at [facility], to be transported 10.7.2021 
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
## 2020-02-16 NOTE — ED Notes (Signed)
Writer tired to call ACT team for patient. Awaiting return call.

## 2020-02-16 NOTE — ED Notes (Signed)
Breakfast tray was given with juice. 

## 2020-02-16 NOTE — ED Notes (Signed)
Pt sitting at edge of door talking to himself. Speaking word salads and hallucinating that someone else is there. Calm and cooperative when need to be.

## 2020-02-16 NOTE — ED Notes (Signed)
Patient refused to give urine sample
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
## 2020-02-16 NOTE — ED Notes (Signed)
Dr Rao at bedside 

## 2020-02-16 NOTE — ED Notes (Signed)
Pt requesting to shower. Pt given shower supplies.

## 2020-02-16 NOTE — ED Notes (Signed)
Tried to call patient's legal guardian/mother [name] at [phone].  Unable to leave message due to mailbox full.

## 2020-02-16 NOTE — ED Notes (Addendum)
Pt belongings: black pants, black socks, black Vans shoes, black bracelet x2.   Pt dressed out by this RN and Kathlen Mody EDT, [employer] officers also present.
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
## 2020-02-16 NOTE — ED Notes (Signed)
Report to include Situation, Background, Assessment, and Recommendations received from Amy RN. Patient alert and oriented, warm and dry, in no acute distress. Patient denies SI, HI, AVH and pain. Patient made aware of Q15 minute rounds and security cameras for their safety. Patient instructed to come to me with needs or concerns.  
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
## 2020-02-17 NOTE — ED Notes (Signed)
Hourly rounding reveals patient in room. No complaints, stable, in no acute distress. Q15 minute rounds and monitoring via Rover and Officer to continue.   

## 2020-02-17 NOTE — ED Notes (Signed)
Pt talking to himself in dayroom. Other times can be heard singing /rapping.

## 2020-02-17 NOTE — ED Notes (Signed)
Hourly rounding reveals patient in room. No complaints, stable, in no acute distress. Q15 minute rounds and monitoring via Security Cameras to continue. 

## 2020-02-17 NOTE — ED Notes (Signed)
Attempted to call report. No answer.

## 2020-02-17 NOTE — ED Notes (Signed)
Pt discharged via [facility] to [facility].  VS stable. Belongings sent with officer.  Legal guardian made aware of transfer.

## 2020-02-17 NOTE — ED Notes (Signed)
RN asked patient if he would like to call his mother. "Why would you ask me that?"  RN explained his mother had called earlier to speak with him.  Pt stated he would call her and was given the phone.

## 2020-02-17 NOTE — ED Notes (Signed)
Pt continues to talk to himself in dayroom. Pt standing by nurses station window braiding his hair.

## 2020-02-17 NOTE — ED Provider Notes (Signed)
Emergency Medicine Observation Re-evaluation Note  [name] is a 24 y.o. male, seen on rounds today.    Physical Exam  BP 111/66 (BP Location: Right Arm)   Pulse (!) 53   Temp 98.6 F (37 C) (Oral)   Resp 18   Ht 5\' 8"  (1.727 m)   Wt 56.7 kg   SpO2 100%   BMI 19.01 kg/m  Physical Exam General: Patient resting comfortably in no distress Lungs: Patient in no respiratory distress Psych patient calm not agitated  ED Course / MDM  EKG:    .  Plan  Patient is planned to go to [facility] today   [facility], MD 02/17/20 [phone]

## 2020-02-17 NOTE — ED Notes (Addendum)
Pt refuses to let this Clinical research associate re-check his VS.

## 2020-02-17 NOTE — ED Notes (Signed)
[name] called to speak with patient.  RN will have patient call when he wakes up.

## 2020-02-17 NOTE — ED Notes (Signed)
Pt sitting in dayroom. 

## 2020-02-17 NOTE — ED Notes (Signed)
Meal tray given 

## 2020-02-17 NOTE — ED Notes (Signed)
Pt given supplies to take a shower.  

## 2020-02-17 NOTE — ED Notes (Signed)
Pt took a shower but refused to put on clean scrubs.

## 2020-02-17 NOTE — ED Notes (Signed)
Pt continues to curse at staff.  Pt is hyper-religious.

## 2020-03-04 ENCOUNTER — Emergency Department
Admission: EM | Admit: 2020-03-04 | Discharge: 2020-03-04 | Disposition: A | Discharge status: Home / Self Care | Payer: BC Managed Care – PPO | Attending: Emergency Medicine | Admitting: Emergency Medicine

## 2020-03-04 ENCOUNTER — Other Ambulatory Visit: Payer: Self-pay

## 2020-03-04 DIAGNOSIS — F17210 Nicotine dependence, cigarettes, uncomplicated: Secondary | ICD-10-CM | POA: Diagnosis not present

## 2020-03-04 DIAGNOSIS — Z8659 Personal history of other mental and behavioral disorders: Secondary | ICD-10-CM | POA: Insufficient documentation

## 2020-03-04 DIAGNOSIS — F209 Schizophrenia, unspecified: Secondary | ICD-10-CM | POA: Diagnosis present

## 2020-03-04 DIAGNOSIS — F250 Schizoaffective disorder, bipolar type: Secondary | ICD-10-CM

## 2020-03-04 DIAGNOSIS — F29 Unspecified psychosis not due to a substance or known physiological condition: Secondary | ICD-10-CM | POA: Diagnosis present

## 2020-03-04 LAB — COMPREHENSIVE METABOLIC PANEL
ALT: 21 U/L (ref 0–44)
AST: 31 U/L (ref 15–41)
Albumin: 4.5 g/dL (ref 3.5–5.0)
Alkaline Phosphatase: 69 U/L (ref 38–126)
Anion gap: 10 (ref 5–15)
BUN: 19 mg/dL (ref 6–20)
CO2: 28 mmol/L (ref 22–32)
Calcium: 9.8 mg/dL (ref 8.9–10.3)
Chloride: 104 mmol/L (ref 98–111)
Creatinine, Ser: 0.82 mg/dL (ref 0.61–1.24)
GFR, Estimated: >60 mL/min (ref >60)
Glucose, Bld: 100 mg/dL — ABNORMAL HIGH (ref 70–99)
Potassium: 3.4 mmol/L — ABNORMAL LOW (ref 3.5–5.1)
Sodium: 142 mmol/L (ref 135–145)
Total Bilirubin: 0.6 mg/dL (ref 0.3–1.2)
Total Protein: 8 g/dL (ref 6.5–8.1)

## 2020-03-04 LAB — CBC
HCT: 44.6 % (ref 39.0–52.0)
Hemoglobin: 14.9 g/dL (ref 13.0–17.0)
MCH: 27.7 pg (ref 26.0–34.0)
MCHC: 33.4 g/dL (ref 30.0–36.0)
MCV: 83.1 fL (ref 80.0–100.0)
Platelets: 218 10*3/uL (ref 150–400)
RBC: 5.37 MIL/uL (ref 4.22–5.81)
RDW: 13.9 % (ref 11.5–15.5)
WBC: 6.8 10*3/uL (ref 4.0–10.5)
nRBC: 0 % (ref 0.0–0.2)

## 2020-03-04 LAB — SALICYLATE LEVEL: Salicylate Lvl: <7 mg/dL — ABNORMAL LOW (ref 7.0–30.0)

## 2020-03-04 LAB — ACETAMINOPHEN LEVEL: Acetaminophen (Tylenol), Serum: <10 ug/mL — ABNORMAL LOW (ref 10–30)

## 2020-03-04 LAB — ETHANOL: Alcohol, Ethyl (B): <10 mg/dL (ref <10)

## 2020-03-04 NOTE — Consult Note (Signed)
[facility] ED Discharge  03/04/2020 12:47 PM [name]  MRN:  [id number] Principal Problem: Schizoaffective disorder, bipolar type [facility]) Discharge Diagnoses: Principal Problem:   Schizoaffective disorder, bipolar type (HCC)  Subjective: "I'm not getting enough sleep."  Patient seen and evaluated in person by this provider and TTS, well known to this ED and providers.  He reports he was using Seroquel for sleep after discharging from [facility] two days ago but did not have a way to get his medications.  [facility] is his ACT team and will be contacted to deliver his medications.  He did receive Abilify maintenna at [facility] prior to discharge this week.  No suicidal/homicidal ideations, hallucinations, paranoia, or mania.  No substance use, stable psychiatrically for discharge.    Total Time spent with patient: 45 minutes  Past Psychiatric History: ADHD, borderline personality, schizoaffective disorder   Past Medical History:  Past Medical History:  Diagnosis Date  . ADD (attention deficit disorder)   . 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 Anemia   . Anxiety   . Bipolar disorder (HCC)   . Borderline personality disorder (HCC)   . Central auditory processing disorder   . Deliberate self-cutting   . Depressed   . Eczema   . Headache(784.0)   . Oppositional defiant disorder   . Schizophrenia [facility])     Past Surgical History:  Procedure Laterality Date  . BACK SURGERY    . FRACTURE SURGERY    . NO PAST SURGERIES    . POSTERIOR LUMBAR FUSION N/A 02/19/2015   Procedure: LATERAL L-2 CORPECTOMY;  Surgeon: Lisbeth Renshaw, MD;  Location: [facility] OR;  Service: Neurosurgery;  Laterality: N/A;  . POSTERIOR LUMBAR FUSION 4 LEVEL  02/19/2015   Procedure: Posterior T-12 - L-4 STABILIZATION OF POSTERIOR LUMBAR;  Surgeon: Lisbeth Renshaw, MD;  Location: [facility] OR;  Service: Neurosurgery;;  . TIBIA IM NAIL INSERTION Left 02/20/2015   Procedure:  INTRAMEDULLARY (IM) NAIL LEFT TIBIAL;  Surgeon: [name], MD;  Location: [facility] OR;  Service: Orthopedics;  Laterality: Left;   Family History: No family history on file. Family Psychiatric  History: none Social History:  Social History   Substance and Sexual Activity  Alcohol Use Yes  . Alcohol/week: 2.0 standard drinks  . Types: 2 Shots of liquor per week     Social History   Substance and Sexual Activity  Drug Use Yes  . Types: Cocaine, Marijuana, Benzodiazepines, Heroin   Comment: heroin    Social History   Socioeconomic History  . Marital status: Single    Spouse name: Not on file  . Number of children: Not on file  . Years of education: Not on file  . Highest education level: Not on file  Occupational History  . Occupation: Unemployed  Tobacco Use  . Smoking status: Current Every Day Smoker    Packs/day: 2.00    Types: Cigarettes  . Smokeless tobacco: Never Used  . Tobacco comment: 1-3 packs   Vaping Use  . Vaping Use: Some days  Substance and Sexual Activity  . Alcohol use: Yes    Alcohol/week: 2.0 standard drinks    Types: 2 Shots of liquor per week  . Drug use: Yes    Types: Cocaine, Marijuana, Benzodiazepines, Heroin    Comment: heroin  . Sexual activity: Yes    Birth control/protection: None    Comment: pt reluctant to answer questions and frequently stated " I dont know"  Other Topics Concern  .  Not on file  Social History Narrative   ** Merged History Encounter **    Pt reported that he is unemployed, homeless as his mother is in rehab   Social Determinants of Health   Financial Resource Strain:   . Difficulty of Paying Living Expenses: Not on file  Food Insecurity:   . Worried About Programme researcher, broadcasting/film/video in the Last Year: Not on file  . Ran Out of Food in the Last Year: Not on file  Transportation Needs:   . Lack of Transportation (Medical): Not on file  . Lack of Transportation (Non-Medical): Not on file  Physical Activity:   . Days of  Exercise per Week: Not on file  . Minutes of Exercise per Session: Not on file  Stress:   . Feeling of Stress : Not on file  Social Connections:   . Frequency of Communication with Friends and Family: Not on file  . Frequency of Social Gatherings with Friends and Family: Not on file  . Attends Religious Services: Not on file  . Active Member of Clubs or Organizations: Not on file  . Attends Banker Meetings: Not on file  . Marital Status: Not on file    Has this patient used any form of tobacco in the last 30 days? (Cigarettes, Smokeless Tobacco, Cigars, and/or Pipes) A prescription for an FDA-approved tobacco cessation medication was offered at discharge and the patient refused  Current Medications: No current facility-administered medications for this encounter.   Current Outpatient Medications  Medication Sig Dispense Refill  . benztropine (COGENTIN) 1 MG tablet Take 1 tablet (1 mg total) by mouth 2 (two) times daily. (Patient not taking: Reported on 02/03/2020) 60 tablet 3  . gabapentin (NEURONTIN) 300 MG capsule Take 1 capsule (300 mg total) by mouth 3 (three) times daily. (Patient not taking: Reported on 02/03/2020) 90 capsule 11  . haloperidol (HALDOL) 10 MG tablet Take 10-20 mg by mouth 2 (two) times daily. Take one 10 mg tablet by mouth once daily and two tablets (20 mg) at bedtime    . haloperidol decanoate (HALDOL DECANOATE) 50 MG/ML injection Inject 4 mLs (200 mg total) into the muscle every 28 (twenty-eight) days. Due 05/29/2019 (Patient not taking: Reported on 02/03/2020) 4 mL 11  . lithium carbonate (ESKALITH) 450 MG CR tablet Take 1 tablet (450 mg total) by mouth every 12 (twelve) hours. (Patient not taking: Reported on 02/03/2020) 60 tablet 2  . topiramate (TOPAMAX) 50 MG tablet Take 1 tablet (50 mg total) by mouth 2 (two) times daily. (Patient not taking: Reported on 02/03/2020) 60 tablet 2  . traZODone (DESYREL) 150 MG tablet Take 1 tablet (150 mg total) by mouth  at bedtime. (Patient not taking: Reported on 02/03/2020) 30 tablet 11   PTA Medications: (Not in a hospital admission)   Musculoskeletal: Strength & Muscle Tone: within normal limits Gait & Station: normal Patient leans: N/A  Psychiatric Specialty Exam: Physical Exam Vitals and nursing note reviewed.  Constitutional:      Appearance: Normal appearance.  HENT:     Head: Normocephalic.     Nose: Nose normal.  Pulmonary:     Effort: Pulmonary effort is normal.  Musculoskeletal:        General: Normal range of motion.     Cervical back: Normal range of motion.  Neurological:     General: No focal deficit present.     Mental Status: He is alert and oriented to person, place, and time.  Psychiatric:  Attention and Perception: Attention and perception normal.        Mood and Affect: Mood and affect normal.        Speech: Speech normal.        Behavior: Behavior normal. Behavior is cooperative.        Thought Content: Thought content normal.        Cognition and Memory: Cognition and memory normal.        Judgment: Judgment normal.     Review of Systems  Psychiatric/Behavioral: Positive for sleep disturbance.  All other systems reviewed and are negative.   Blood pressure 117/69, pulse 86, temperature 98.8 F (37.1 C), temperature source Oral, resp. rate 16, height 5\' 9"  (1.753 m), weight 56.7 kg, SpO2 99 %.Body mass index is 18.46 kg/m.  General Appearance: Casual  Eye Contact:  Good  Speech:  Normal Rate  Volume:  Normal  Mood:  Irritable  Affect:  Blunt  Thought Process:  Coherent and Descriptions of Associations: Intact  Orientation:  Full (Time, Place, and Person)  Thought Content:  WDL and Logical  Suicidal Thoughts:  No  Homicidal Thoughts:  No  Memory:  Immediate;   Good Recent;   Good Remote;   Good  Judgement:  Fair  Insight:  Fair  Psychomotor Activity:  Normal  Concentration:  Concentration: Good and Attention Span: Good  Recall:  Good  Fund of  Knowledge:  Fair  Language:  Good  Akathisia:  No  Handed:  Right  AIMS (if indicated):     Assets:  Leisure Time Physical Health Resilience  ADL's:  Intact  Cognition:  WNL  Sleep:        Demographic Factors:  Male, Adolescent or young adult and Living alone  Loss Factors: NA  Historical Factors: NA  Risk Reduction Factors:   Sense of responsibility to family, Positive social support and Positive therapeutic relationship  Continued Clinical Symptoms:  Irritability, mild  Cognitive Features That Contribute To Risk:  None    Suicide Risk:  Minimal: No identifiable suicidal ideation.  Patients presenting with no risk factors but with morbid ruminations; may be classified as minimal risk based on the severity of the depressive symptoms    Plan Of Care/Follow-up recommendations:  Schizoaffective disorder, bipolar type: -Continue Haldol 10 mg in the am and 20 mg in the pm -Continue Lithium 450 mg BID -follow up with ACT team, [facility]  Insomnia: -Continue Trazodone 150 mg at bedtime PRN  EPS: -Continue cogentin 1 mg BID  Anxiety: -Continue gabapentin 300 mg TID Activity:  as tolerated Diet:  heart healthy diet  Disposition: discharge home , NP 03/04/2020, 12:47 PM

## 2020-03-04 NOTE — ED Notes (Signed)
Pt states no SI/HI. Pt states at time he has "no will to live," pt denies any plan of SI.

## 2020-03-04 NOTE — ED Triage Notes (Signed)
Patient states he has been spending a lot of time alone and is feeling very isolated and having thoughts of self harm but no plan.  Patient contracts for safety with this RN.

## 2020-03-04 NOTE — ED Provider Notes (Signed)
Patient cleared for discharge by behavioral team   Jene Every, MD 03/04/20 1235

## 2020-03-04 NOTE — ED Notes (Signed)
Vol.. 

## 2020-03-04 NOTE — ED Notes (Signed)
Pt to discharge to home -  Unable to get guardian on the telephone  Psychiatry team spoke with his guardian and is aware of his plan of care

## 2020-03-04 NOTE — Discharge Instructions (Signed)
Managing Schizoaffective Disorder °If you have been diagnosed with schizoaffective disorder (ScAD), you may be relieved to know why you have felt or behaved a certain way. You may also feel overwhelmed about the treatment ahead, how to get the support you need, and how to deal with the condition day-to-day. With care and support, you can learn to manage your symptoms and live with ScAD. °ScAD is a chronic, lifelong condition that may occur in cycles. Periods of severe symptoms may be followed by periods of less severe symptoms or improvement. There are steps you can take to help manage ScAD and make your life better. °How to manage lifestyle changes °Managing stress °Stress is your body's reaction to life changes and events, both good and bad. For people with ScAD, stress can cause more severe symptoms to start (can be a trigger), so it is important to learn ways to deal with stress. Your health care provider, therapist, or counselor may suggest techniques such as: °· Meditation, muscle relaxation, and breathing exercises. °· Music therapy. This can include creating music or listening to music. °· Life skills training. This training is focused on work, self-care, money, house management, and social skills. °Other things you can do to manage stress include: °· Keeping a stress diary. This can help you learn what causes your stress to start and how you can control your response to those triggers. °· Exercising. Even a short daily walk can help. °· Getting enough sleep. °· Making a schedule to manage your time. Knowing what you will do from day to day helps you avoid feeling overwhelmed by tasks and deadlines. °· Spending time on hobbies you enjoy that help you relax. ° °Medicines °Your health care provider is likely to prescribe various types of medicine depending on your symptoms. These may include one or more of the following types: °· Antipsychotics. °· Mood stabilizers. °· Antidepressants. °Make sure you: °· Talk  with your pharmacist or health care provider about all medicines that you take, the possible side effects, and which medicines are safe to take together. °· Make it your goal to take part in all treatment decisions (shared decision-making). Ask about possible side effects of medicines that your health care provider recommends, and tell him or her how you feel about having those side effects. It is best if shared decision-making with your health care provider is part of your total treatment plan. °Relationships °Having the support of your family and friends can play a major role in the success of your treatment. The following steps can help you maintain healthy relationships: °· Think about going to couples therapy, family therapy, or family education classes. °· Create a written plan for your treatment, and include close family members and friends in the process. °· Consider bringing your partner or another family member or friend to the appointments you have with your health care provider. °How to recognize changes in your condition °If you find that your condition is getting worse, talk to your health care provider right away. Watch for these signs: °· Your mood becomes extreme with either emotional highs or the intense lows of depression. °· Your speech becomes unclear. °· You are disorganized, show the wrong social behaviors, or withdraw from social activities. °· You have racing thoughts and have trouble thinking clearly or staying focused. °· You hear, see, taste, and believe things that others do not. °· You have poor personal hygiene, weight gain or weight loss, or changes in how you are sleeping or eating. °  Follow these instructions at home: °· Take over-the-counter and prescription medicines only as told by your health care provider. Do not start new medicines or stop taking medicines before you ask your health care provider if it is safe to make those changes. °· Avoid caffeine, alcohol, and drugs. They  can affect how your medicine works and can make your symptoms worse. °· Eat a healthy diet. °· Look for support groups in your area so you can meet other people with your condition. You can learn new methods of managing ScAD by listening to others. °· Keep all follow-up visits as told by your health care provider, therapist, or counselor. This is important. °Where to find support °Talking to others °· Reach out to trusted friends or family members, explain your condition, and let them know that you are working with a health care team. °· Consider giving educational materials to friends and family. °· If you are having trouble telling your friends and family about your condition, keep in mind that honest and open communication can make these conversations easier. °Finances °Be sure to check with your insurance carrier to find out what treatment options are covered by your plan. You may also be able to find financial assistance through not-for-profit organizations or with local government-based resources. °If you are taking medicines, you may be able to get the generic form, which may be less expensive than brand-name medicine. Some makers of prescription medicines also offer help to patients who cannot afford the medicines that they need. °Therapy and support groups °· Make sure you find a counselor or therapist who is familiar with ScAD. Meet with your counselor or therapist once a week or more often if needed. °· Find support programs for people with ScAD. °Where to find more information °· National Alliance on Mental Illness: www.nami.org °Contact a health care provider if: °· You are not able to take your medicines as prescribed. °· Your symptoms get worse. °Get help right away if: °· You have serious thoughts about hurting yourself or others. °If you ever feel like you may hurt yourself or others, or have thoughts about taking your own life, get help right away. You can go to your nearest emergency department or  call: °· Your local emergency services (911 in the [place] °· A suicide crisis helpline, such as the National Suicide Prevention Lifeline at 1-800-273-8255. This is open 24 hours a day. °Summary °· Schizoaffective disorder (ScAD) is a chronic, lifelong illness. It is best controlled with continuous treatment that includes medicine and therapy. °· Learning ways to manage stress may help your treatment to work better. °· Having the support of your family and friends can be a key to making your treatment a success. °· If you find that your condition is getting worse, talk to your health care provider right away. °This information is not intended to replace advice given to you by your health care provider. Make sure you discuss any questions you have with your health care provider. °Document Revised: 08/21/2018 Document Reviewed: 08/29/2016 °Elsevier Patient Education © 2020 [employer] ° °

## 2020-03-04 NOTE — ED Notes (Signed)
Clear colored stone earrings, black colored hair covering, gray colored sweatshirt, black colored shorts, black colored socks and gray colored slip on shoes placed in labeled belongings bag to be secured on the unit.

## 2020-03-04 NOTE — ED Provider Notes (Signed)
[facility] Emergency Department Provider Note    ____________________________________________   I have reviewed the triage vital signs and the nursing notes.   HISTORY  Chief Complaint Psychiatric Evaluation   History limited by: Not Limited   HPI [name] is a 24 y.o. male who presents to the emergency department today because he feels like he is not all there. He states he feels a dissociation. Some of this might be due to the lunar cycle being stronger currently than it typically has been. He says that he has a history of similarly feeling strange. He denies any hallucinations. States he has been diagnosed with schizophrenia in the past and had the monthly injection a few weeks ago but feels like it has not helped. Additionally he states he was recently discharged from [facility] for psychiatric concern.   Records reviewed. Per medical record review patient has a history of schizophrenia, bipolar.   Past Medical History:  Diagnosis Date  . ADD (attention deficit disorder)   . 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 Anemia   . Anxiety   . Bipolar disorder (HCC)   . Borderline personality disorder (HCC)   . Central auditory processing disorder   . Deliberate self-cutting   . Depressed   . Eczema   . Headache(784.0)   . Oppositional defiant disorder   . Schizophrenia [facility])     Patient Active Problem List   Diagnosis Date Noted  . Bipolar 1 disorder (HCC) 04/27/2019  . Schizophrenia (HCC) 02/24/2019  . Schizoaffective disorder (HCC) 02/22/2019  . Suicidal ideations   . Substance induced mood disorder (HCC) 03/10/2018  . Paranoid schizophrenia (HCC)   . Cannabis use disorder, moderate, dependence (HCC) 06/05/2016  . Eating disorder Unspecified 05/22/2016  . Borderline personality disorder (HCC) 05/16/2016  . Tobacco use disorder 05/15/2016    Past Surgical History:  Procedure Laterality  Date  . BACK SURGERY    . FRACTURE SURGERY    . NO PAST SURGERIES    . POSTERIOR LUMBAR FUSION N/A 02/19/2015   Procedure: LATERAL L-2 CORPECTOMY;  Surgeon: Lisbeth Renshaw, MD;  Location: [facility] OR;  Service: Neurosurgery;  Laterality: N/A;  . POSTERIOR LUMBAR FUSION 4 LEVEL  02/19/2015   Procedure: Posterior T-12 - L-4 STABILIZATION OF POSTERIOR LUMBAR;  Surgeon: Lisbeth Renshaw, MD;  Location: [facility] OR;  Service: Neurosurgery;;  . TIBIA IM NAIL INSERTION Left 02/20/2015   Procedure: INTRAMEDULLARY (IM) NAIL LEFT TIBIAL;  Surgeon: [name], MD;  Location: [facility] OR;  Service: Orthopedics;  Laterality: Left;    Prior to Admission medications   Medication Sig Start Date End Date Taking? Authorizing Provider  benztropine (COGENTIN) 1 MG tablet Take 1 tablet (1 mg total) by mouth 2 (two) times daily. Patient not taking: Reported on 02/03/2020 05/04/19   Malvin Johns, MD  gabapentin (NEURONTIN) 300 MG capsule Take 1 capsule (300 mg total) by mouth 3 (three) times daily. Patient not taking: Reported on 02/03/2020 05/04/19   Malvin Johns, MD  haloperidol (HALDOL) 10 MG tablet Take 10-20 mg by mouth 2 (two) times daily. Take one 10 mg tablet by mouth once daily and two tablets (20 mg) at bedtime    [provider]  haloperidol decanoate (HALDOL DECANOATE) 50 MG/ML injection Inject 4 mLs (200 mg total) into the muscle every 28 (twenty-eight) days. Due 05/29/2019 Patient not taking: Reported on 02/03/2020 05/04/19   Malvin Johns, MD  lithium carbonate (ESKALITH) 450 MG CR tablet Take 1 tablet (  450 mg total) by mouth every 12 (twelve) hours. Patient not taking: Reported on 02/03/2020 05/04/19   Malvin Johns, MD  topiramate (TOPAMAX) 50 MG tablet Take 1 tablet (50 mg total) by mouth 2 (two) times daily. Patient not taking: Reported on 02/03/2020 05/04/19   Malvin Johns, MD  traZODone (DESYREL) 150 MG tablet Take 1 tablet (150 mg total) by mouth at bedtime. Patient not taking: Reported on 02/03/2020  03/09/19   Malvin Johns, MD    Allergies Clozapine, Prolixin [fluphenazine], and Risperdal [risperidone]  No family history on file.  Social History Social History   Tobacco Use  . Smoking status: Current Every Day Smoker    Packs/day: 2.00    Types: Cigarettes  . Smokeless tobacco: Never Used  . Tobacco comment: 1-3 packs   Vaping Use  . Vaping Use: Some days  Substance Use Topics  . Alcohol use: Yes    Alcohol/week: 2.0 standard drinks    Types: 2 Shots of liquor per week  . Drug use: Yes    Types: Cocaine, Marijuana, Benzodiazepines, Heroin    Comment: heroin    Review of Systems Constitutional: No fever/chills Eyes: No visual changes. ENT: No sore throat. Cardiovascular: Denies chest pain. Respiratory: Denies shortness of breath. Gastrointestinal: No abdominal pain.  No nausea, no vomiting.  No diarrhea.   Genitourinary: Negative for dysuria. Musculoskeletal: Negative for back pain. Skin: Negative for rash. Neurological: Negative for headaches, focal weakness or numbness.  ____________________________________________   PHYSICAL EXAM:  VITAL SIGNS: ED Triage Vitals  Enc Vitals Group     BP 03/04/20 0556 117/69     Pulse Rate 03/04/20 0556 86     Resp 03/04/20 0556 16     Temp 03/04/20 0556 98.8 F (37.1 C)     Temp Source 03/04/20 0556 Oral     SpO2 03/04/20 0556 99 %     Weight 03/04/20 0557 125 lb (56.7 kg)     Height 03/04/20 0557 5\' 9"  (1.753 m)     Head Circumference --      Peak Flow --      Pain Score 03/04/20 0557 0   Constitutional: Alert and oriented.  Eyes: Conjunctivae are normal.  ENT      Head: Normocephalic and atraumatic.      Nose: No congestion/rhinnorhea.      Mouth/Throat: Mucous membranes are moist.      Neck: No stridor. Hematological/Lymphatic/Immunilogical: No cervical lymphadenopathy. Cardiovascular: Normal rate, regular rhythm.  No murmurs, rubs, or gallops.  Respiratory: Normal respiratory effort without tachypnea nor  retractions. Breath sounds are clear and equal bilaterally. No wheezes/rales/rhonchi. Gastrointestinal: Soft and non tender. No rebound. No guarding.  Genitourinary: Deferred Musculoskeletal: Normal range of motion in all extremities. No lower extremity edema. Neurologic:  Normal speech and language. No gross focal neurologic deficits are appreciated.  Skin:  Skin is warm, dry and intact. No rash noted. Psychiatric: Delusions. Inappropriate laughter.   ____________________________________________    LABS (pertinent positives/negatives)  CMP wnl except k 3.4, glu 100 CBC wbc 6.8, hgb 14.9, plt 218  ____________________________________________   EKG  None  ____________________________________________    RADIOLOGY  None  ____________________________________________   PROCEDURES  Procedures  ____________________________________________   INITIAL IMPRESSION / ASSESSMENT AND PLAN / ED COURSE  Pertinent labs & imaging results that were available during my care of the patient were reviewed by me and considered in my medical decision making (see chart for details).   Patient presented to the emergency department today  with psychiatric complaints. Will have psychiatric team evaluate.  The patient has been placed in psychiatric observation due to the need to provide a safe environment for the patient while obtaining psychiatric consultation and evaluation, as well as ongoing medical and medication management to treat the patient's condition.  The patient has not been placed under full IVC at this time.   ____________________________________________   FINAL CLINICAL IMPRESSION(S) / ED DIAGNOSES  Final diagnoses:  Schizophrenia, unspecified type (HCC)     Note: This dictation was prepared with Dragon dictation. Any transcriptional errors that result from this process are unintentional     Phineas Semen, MD 03/04/20 [phone]

## 2020-03-27 ENCOUNTER — Encounter (HOSPITAL_COMMUNITY): Payer: Self-pay | Admitting: Emergency Medicine

## 2020-03-27 ENCOUNTER — Emergency Department (HOSPITAL_COMMUNITY)
Admission: EM | Admit: 2020-03-27 | Discharge: 2020-03-28 | Disposition: A | Discharge status: Home / Self Care | Payer: BC Managed Care – PPO | Attending: Emergency Medicine | Admitting: Emergency Medicine

## 2020-03-27 ENCOUNTER — Other Ambulatory Visit: Payer: Self-pay

## 2020-03-27 DIAGNOSIS — F32A Depression, unspecified: Secondary | ICD-10-CM

## 2020-03-27 DIAGNOSIS — Z79899 Other long term (current) drug therapy: Secondary | ICD-10-CM | POA: Diagnosis not present

## 2020-03-27 DIAGNOSIS — R45851 Suicidal ideations: Secondary | ICD-10-CM | POA: Diagnosis not present

## 2020-03-27 DIAGNOSIS — F17210 Nicotine dependence, cigarettes, uncomplicated: Secondary | ICD-10-CM | POA: Insufficient documentation

## 2020-03-27 DIAGNOSIS — Z8659 Personal history of other mental and behavioral disorders: Secondary | ICD-10-CM | POA: Diagnosis not present

## 2020-03-27 NOTE — ED Triage Notes (Addendum)
Patient reports suicidal ideation but did not disclose his plan , denies hallucinations , unable to focus during encounter at triage with flight of ideas .

## 2020-03-28 LAB — CBC
HCT: 48.5 % (ref 39.0–52.0)
Hemoglobin: 15.5 g/dL (ref 13.0–17.0)
MCH: 28 pg (ref 26.0–34.0)
MCHC: 32 g/dL (ref 30.0–36.0)
MCV: 87.7 fL (ref 80.0–100.0)
Platelets: 251 10*3/uL (ref 150–400)
RBC: 5.53 MIL/uL (ref 4.22–5.81)
RDW: 14.1 % (ref 11.5–15.5)
WBC: 6.6 10*3/uL (ref 4.0–10.5)
nRBC: 0 % (ref 0.0–0.2)

## 2020-03-28 LAB — COMPREHENSIVE METABOLIC PANEL
ALT: 28 U/L (ref 0–44)
AST: 30 U/L (ref 15–41)
Albumin: 4.2 g/dL (ref 3.5–5.0)
Alkaline Phosphatase: 66 U/L (ref 38–126)
Anion gap: 10 (ref 5–15)
BUN: 8 mg/dL (ref 6–20)
CO2: 29 mmol/L (ref 22–32)
Calcium: 9.7 mg/dL (ref 8.9–10.3)
Chloride: 100 mmol/L (ref 98–111)
Creatinine, Ser: 0.86 mg/dL (ref 0.61–1.24)
GFR, Estimated: >60 mL/min (ref >60)
Glucose, Bld: 98 mg/dL (ref 70–99)
Potassium: 3.7 mmol/L (ref 3.5–5.1)
Sodium: 139 mmol/L (ref 135–145)
Total Bilirubin: 0.3 mg/dL (ref 0.3–1.2)
Total Protein: 7.4 g/dL (ref 6.5–8.1)

## 2020-03-28 LAB — SALICYLATE LEVEL: Salicylate Lvl: <7 mg/dL — ABNORMAL LOW (ref 7.0–30.0)

## 2020-03-28 LAB — ACETAMINOPHEN LEVEL: Acetaminophen (Tylenol), Serum: <10 ug/mL — ABNORMAL LOW (ref 10–30)

## 2020-03-28 LAB — ETHANOL: Alcohol, Ethyl (B): <10 mg/dL (ref <10)

## 2020-03-28 NOTE — ED Notes (Signed)
RN attempted to reach Mother (legal guardian) with no success; No voicemail left-Monique,RN

## 2020-03-28 NOTE — ED Notes (Signed)
Patient enters unit asking if he could leave; off going RN spoke with EDP who advised that if patient continues to denies SI then he is able to leave; pt remains voluntary at this time. Patient will be discharged momentarily and Legal guardian contacted; Pt continues to denie current SI.-Monique,RN

## 2020-03-28 NOTE — Discharge Instructions (Signed)
Please follow up with your doctor for further evaluation and treatment if symptoms continue.   Return to the emergency department if you have any thoughts of wanting to harm yourself or others, or symptoms of uncontrolled mental health issues you feel you need help with.

## 2020-03-28 NOTE — ED Provider Notes (Signed)
[facility] EMERGENCY DEPARTMENT Provider Note   CSN: [id number] Arrival date & time: 03/27/20  2311     History Chief Complaint  Patient presents with  . Suicidal    [name] is a 24 y.o. male.  Patient to ED with suicidal thoughts expressed during triage. History of schizophrenia, ADD, ODD, depression. He is largely noncommunicative during HPI. Chart reviewed, multiple Behavioral Health admissions. He denies pain, illness.   The history is provided by the patient. No language interpreter was used.       Past Medical History:  Diagnosis Date  . ADD (attention deficit disorder)   . 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 Anemia   . Anxiety   . Bipolar disorder (HCC)   . Borderline personality disorder (HCC)   . Central auditory processing disorder   . Deliberate self-cutting   . Depressed   . Eczema   . Headache(784.0)   . Oppositional defiant disorder   . Schizophrenia [facility])     Patient Active Problem List   Diagnosis Date Noted  . Schizoaffective disorder, bipolar type (HCC) 09/16/2016  . Eating disorder Unspecified 05/22/2016  . Borderline personality disorder (HCC) 05/16/2016  . Tobacco use disorder 05/15/2016    Past Surgical History:  Procedure Laterality Date  . BACK SURGERY    . FRACTURE SURGERY    . NO PAST SURGERIES    . POSTERIOR LUMBAR FUSION N/A 02/19/2015   Procedure: LATERAL L-2 CORPECTOMY;  Surgeon: Lisbeth Renshaw, MD;  Location: [facility] OR;  Service: Neurosurgery;  Laterality: N/A;  . POSTERIOR LUMBAR FUSION 4 LEVEL  02/19/2015   Procedure: Posterior T-12 - L-4 STABILIZATION OF POSTERIOR LUMBAR;  Surgeon: Lisbeth Renshaw, MD;  Location: [facility] OR;  Service: Neurosurgery;;  . TIBIA IM NAIL INSERTION Left 02/20/2015   Procedure: INTRAMEDULLARY (IM) NAIL LEFT TIBIAL;  Surgeon: [name], MD;  Location: [facility] OR;  Service: Orthopedics;  Laterality: Left;       No family  history on file.  Social History   Tobacco Use  . Smoking status: Current Every Day Smoker    Packs/day: 2.00    Types: Cigarettes  . Smokeless tobacco: Never Used  . Tobacco comment: 1-3 packs   Vaping Use  . Vaping Use: Some days  Substance Use Topics  . Alcohol use: Yes    Alcohol/week: 2.0 standard drinks    Types: 2 Shots of liquor per week  . Drug use: Yes    Types: Cocaine, Marijuana, Benzodiazepines, Heroin    Comment: heroin    Home Medications Prior to Admission medications   Medication Sig Start Date End Date Taking? Authorizing Provider  benztropine (COGENTIN) 1 MG tablet Take 1 tablet (1 mg total) by mouth 2 (two) times daily. Patient not taking: Reported on 02/03/2020 05/04/19   Malvin Johns, MD  gabapentin (NEURONTIN) 300 MG capsule Take 1 capsule (300 mg total) by mouth 3 (three) times daily. Patient not taking: Reported on 02/03/2020 05/04/19   Malvin Johns, MD  haloperidol (HALDOL) 10 MG tablet Take 10-20 mg by mouth 2 (two) times daily. Take one 10 mg tablet by mouth once daily and two tablets (20 mg) at bedtime    [provider]  haloperidol decanoate (HALDOL DECANOATE) 50 MG/ML injection Inject 4 mLs (200 mg total) into the muscle every 28 (twenty-eight) days. Due 05/29/2019 Patient not taking: Reported on 02/03/2020 05/04/19   Malvin Johns, MD  lithium carbonate (ESKALITH) 450 MG CR tablet Take 1 tablet (  450 mg total) by mouth every 12 (twelve) hours. Patient not taking: Reported on 02/03/2020 05/04/19   Malvin Johns, MD  topiramate (TOPAMAX) 50 MG tablet Take 1 tablet (50 mg total) by mouth 2 (two) times daily. Patient not taking: Reported on 02/03/2020 05/04/19   Malvin Johns, MD  traZODone (DESYREL) 150 MG tablet Take 1 tablet (150 mg total) by mouth at bedtime. Patient not taking: Reported on 02/03/2020 03/09/19   Malvin Johns, MD    Allergies    Clozapine, Prolixin [fluphenazine], and Risperdal [risperidone]  Review of Systems   Review of  Systems  Unable to perform ROS: Other (non-communicative)    Physical Exam Updated Vital Signs BP 123/73 (BP Location: Left Arm)   Pulse 82   Temp 98.4 F (36.9 C) (Oral)   Resp 18   Ht 5\' 9"  (1.753 m)   Wt 60 kg   SpO2 (!) 17%   BMI 19.53 kg/m   Physical Exam Vitals and nursing note reviewed.  Constitutional:      General: He is not in acute distress.    Appearance: Normal appearance. He is well-developed.  HENT:     Head: Normocephalic and atraumatic.  Eyes:     Conjunctiva/sclera: Conjunctivae normal.  Cardiovascular:     Rate and Rhythm: Normal rate and regular rhythm.     Heart sounds: No murmur heard.   Pulmonary:     Effort: Pulmonary effort is normal.     Breath sounds: Normal breath sounds. No wheezing, rhonchi or rales.  Abdominal:     General: Bowel sounds are normal.     Palpations: Abdomen is soft.     Tenderness: There is no abdominal tenderness. There is no guarding or rebound.  Musculoskeletal:        General: Normal range of motion.     Cervical back: Normal range of motion and neck supple.  Skin:    General: Skin is warm and dry.     Findings: No rash.  Neurological:     General: No focal deficit present.     Mental Status: He is alert.     ED Results / Procedures / Treatments   Labs (all labs ordered are listed, but only abnormal results are displayed) Labs Reviewed  SALICYLATE LEVEL - Abnormal; Notable for the following components:      Result Value   Salicylate Lvl <7.0 (*)    All other components within normal limits  ACETAMINOPHEN LEVEL - Abnormal; Notable for the following components:   Acetaminophen (Tylenol), Serum <10 (*)    All other components within normal limits  COMPREHENSIVE METABOLIC PANEL  ETHANOL  CBC  RAPID URINE DRUG SCREEN, HOSP PERFORMED    EKG None  Radiology No results found.  Procedures Procedures (including critical care time)  Medications Ordered in ED Medications - No data to display  ED Course   I have reviewed the triage vital signs and the nursing notes.  Pertinent labs & imaging results that were available during my care of the patient were reviewed by me and considered in my medical decision making (see chart for details).    MDM Rules/Calculators/A&P                          Patient to ED voicing SI on arrival. Labs done. VSS. He is considered medically cleared and felt appropriate for TTS evaluation. SI vs depression vs schizophrenia.  Patient states he would like to leave and go  home. He continues to deny SI to me and to nursing. He does not meet IVC criteria. Will discharge home.   Final Clinical Impression(s) / ED Diagnoses Final diagnoses:  None   1. Depression 2. History of schizophrenia   Rx / DC Orders ED Discharge Orders    None       Elpidio Anis, PA-C 03/28/20 0149    Gilda Crease, MD 03/28/20 [phone]

## 2020-03-28 NOTE — ED Notes (Signed)
Pt verbally states intent to harm himself or suicidal ideation. Per [place] PA pt may leave if she wishes and pt states he wishes to leave.

## 2020-04-11 ENCOUNTER — Other Ambulatory Visit: Payer: Self-pay

## 2020-04-11 ENCOUNTER — Ambulatory Visit (HOSPITAL_COMMUNITY)
Admission: EM | Admit: 2020-04-11 | Discharge: 2020-04-11 | Discharge status: Against Medical Advice | Payer: BC Managed Care – PPO | Attending: Family Medicine | Admitting: Family Medicine

## 2020-04-11 NOTE — ED Triage Notes (Signed)
Called pt in lobby with no answer 

## 2020-04-11 NOTE — ED Notes (Signed)
Pt was called several times by front desk and clinical staff, pt did not answered.

## 2020-05-02 ENCOUNTER — Encounter (HOSPITAL_COMMUNITY): Payer: Self-pay

## 2020-05-02 ENCOUNTER — Ambulatory Visit (HOSPITAL_COMMUNITY)
Admission: EM | Admit: 2020-05-02 | Discharge: 2020-05-02 | Disposition: A | Discharge status: Home / Self Care | Payer: BC Managed Care – PPO | Attending: Student | Admitting: Student

## 2020-05-02 ENCOUNTER — Other Ambulatory Visit: Payer: Self-pay

## 2020-05-02 DIAGNOSIS — F17210 Nicotine dependence, cigarettes, uncomplicated: Secondary | ICD-10-CM | POA: Diagnosis not present

## 2020-05-02 DIAGNOSIS — J069 Acute upper respiratory infection, unspecified: Secondary | ICD-10-CM

## 2020-05-02 DIAGNOSIS — J0100 Acute maxillary sinusitis, unspecified: Secondary | ICD-10-CM

## 2020-05-02 DIAGNOSIS — J3489 Other specified disorders of nose and nasal sinuses: Secondary | ICD-10-CM | POA: Diagnosis not present

## 2020-05-02 DIAGNOSIS — Z79899 Other long term (current) drug therapy: Secondary | ICD-10-CM | POA: Insufficient documentation

## 2020-05-02 DIAGNOSIS — Z20822 Contact with and (suspected) exposure to covid-19: Secondary | ICD-10-CM | POA: Insufficient documentation

## 2020-05-02 DIAGNOSIS — Z888 Allergy status to other drugs, medicaments and biological substances status: Secondary | ICD-10-CM | POA: Insufficient documentation

## 2020-05-02 DIAGNOSIS — F419 Anxiety disorder, unspecified: Secondary | ICD-10-CM | POA: Insufficient documentation

## 2020-05-02 DIAGNOSIS — F250 Schizoaffective disorder, bipolar type: Secondary | ICD-10-CM | POA: Diagnosis not present

## 2020-05-02 LAB — RESP PANEL BY RT-PCR (FLU A&B, COVID) ARPGX2
Influenza A by PCR: NEGATIVE
Influenza B by PCR: NEGATIVE
SARS Coronavirus 2 by RT PCR: NEGATIVE

## 2020-05-02 MED ORDER — BENZONATATE 100 MG PO CAPS: 100.0000 mg | ORAL_CAPSULE | Freq: Three times a day (TID) | ORAL | 0 refills | Status: DC

## 2020-05-02 MED ORDER — AMOXICILLIN-POT CLAVULANATE 875-125 MG PO TABS: 1.0000 | ORAL_TABLET | Freq: Two times a day (BID) | ORAL | 0 refills | Status: DC

## 2020-05-02 NOTE — Discharge Instructions (Signed)
For your sinus infection, start the augmentin. You can also use Tessalon for the cough. You can try Mucinex, Nyquil, etc for congestion.   We'll call you if the result of your covid/flu tests are positive.

## 2020-05-02 NOTE — ED Triage Notes (Signed)
Pt presents with sinus pain, congestion, and sore glands X 2 days.

## 2020-05-02 NOTE — ED Provider Notes (Signed)
[facility]    CSN: [id number] Arrival date & time: 05/02/20  1814      History   Chief Complaint Chief Complaint  Patient presents with  . Sinus Issues  . Cough    HPI [name] is a 24 y.o. male presenting for sinus pain, congestion, and sore lymph nodes x2 days. History of ADD, anxiety, bipolar disorder. Presenting today with 3 days of pain behind eyes, congestion, productive cough, diarrhea, chills, enlarged lymph nodes in neck. Denies fn/v, shortness of breath, chest pain,,teeth pain, headaches,  loss of taste/smell, swollen lymph nodes, ear pain. He is not vaccinated for covid-19.    HPI  Past Medical History:  Diagnosis Date  . ADD (attention deficit disorder)   . 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 Anemia   . Anxiety   . Bipolar disorder (HCC)   . Borderline personality disorder (HCC)   . Central auditory processing disorder   . Deliberate self-cutting   . Depressed   . Eczema   . Headache(784.0)   . Oppositional defiant disorder   . Schizophrenia [facility])     Patient Active Problem List   Diagnosis Date Noted  . Schizoaffective disorder, bipolar type (HCC) 09/16/2016  . Eating disorder Unspecified 05/22/2016  . Borderline personality disorder (HCC) 05/16/2016  . Tobacco use disorder 05/15/2016    Past Surgical History:  Procedure Laterality Date  . BACK SURGERY    . FRACTURE SURGERY    . NO PAST SURGERIES    . POSTERIOR LUMBAR FUSION N/A 02/19/2015   Procedure: LATERAL L-2 CORPECTOMY;  Surgeon: Lisbeth Renshaw, MD;  Location: [facility];  Service: Neurosurgery;  Laterality: N/A;  . POSTERIOR LUMBAR FUSION 4 LEVEL  02/19/2015   Procedure: Posterior T-12 - L-4 STABILIZATION OF POSTERIOR LUMBAR;  Surgeon: Lisbeth Renshaw, MD;  Location: [facility] OR;  Service: Neurosurgery;;  . TIBIA IM NAIL INSERTION Left 02/20/2015   Procedure: INTRAMEDULLARY (IM) NAIL LEFT TIBIAL;  Surgeon: [name], MD;   Location: [facility];  Service: Orthopedics;  Laterality: Left;       Home Medications    Prior to Admission medications   Medication Sig Start Date End Date Taking? Authorizing Provider  amoxicillin-clavulanate (AUGMENTIN) 875-125 MG tablet Take 1 tablet by mouth every 12 (twelve) hours. 05/02/20   Rhys Martini, PA-C  benzonatate (TESSALON) 100 MG capsule Take 1 capsule (100 mg total) by mouth every 8 (eight) hours. 05/02/20   Rhys Martini, PA-C  benztropine (COGENTIN) 1 MG tablet Take 1 tablet (1 mg total) by mouth 2 (two) times daily. Patient not taking: Reported on 02/03/2020 05/04/19   Malvin Johns, MD  gabapentin (NEURONTIN) 300 MG capsule Take 1 capsule (300 mg total) by mouth 3 (three) times daily. Patient not taking: Reported on 02/03/2020 05/04/19   Malvin Johns, MD  haloperidol (HALDOL) 10 MG tablet Take 10-20 mg by mouth 2 (two) times daily. Take one 10 mg tablet by mouth once daily and two tablets (20 mg) at bedtime    [provider]  haloperidol decanoate (HALDOL DECANOATE) 50 MG/ML injection Inject 4 mLs (200 mg total) into the muscle every 28 (twenty-eight) days. Due 05/29/2019 Patient not taking: Reported on 02/03/2020 05/04/19   Malvin Johns, MD  lithium carbonate (ESKALITH) 450 MG CR tablet Take 1 tablet (450 mg total) by mouth every 12 (twelve) hours. Patient not taking: Reported on 02/03/2020 05/04/19   Malvin Johns, MD  topiramate (TOPAMAX) 50 MG tablet Take 1 tablet (  50 mg total) by mouth 2 (two) times daily. Patient not taking: Reported on 02/03/2020 05/04/19   Malvin Johns, MD  traZODone (DESYREL) 150 MG tablet Take 1 tablet (150 mg total) by mouth at bedtime. Patient not taking: Reported on 02/03/2020 03/09/19   Malvin Johns, MD    Family History History reviewed. No pertinent family history.  Social History Social History   Tobacco Use  . Smoking status: Current Every Day Smoker    Packs/day: 2.00    Types: Cigarettes  . Smokeless tobacco: Never  Used  . Tobacco comment: 1-3 packs   Vaping Use  . Vaping Use: Some days  Substance Use Topics  . Alcohol use: Yes    Alcohol/week: 2.0 standard drinks    Types: 2 Shots of liquor per week  . Drug use: Yes    Types: Cocaine, Marijuana, Benzodiazepines, Heroin    Comment: heroin     Allergies   Clozapine, Prolixin [fluphenazine], and Risperdal [risperidone]   Review of Systems Review of Systems  Constitutional: Positive for chills.  HENT: Positive for congestion, sinus pain and sore throat.   Respiratory: Positive for cough.   Musculoskeletal: Positive for myalgias.  All other systems reviewed and are negative.    Physical Exam Triage Vital Signs ED Triage Vitals  Enc Vitals Group     BP 05/02/20 1931 (!) 113/59     Pulse Rate 05/02/20 1931 62     Resp 05/02/20 1931 17     Temp 05/02/20 1931 98.3 F (36.8 C)     Temp Source 05/02/20 1931 Oral     SpO2 05/02/20 1931 98 %     Weight --      Height --      Head Circumference --      Peak Flow --      Pain Score 05/02/20 1930 5     Pain Loc --      Pain Edu? --      Excl. in [place]? --    No data found.  Updated Vital Signs BP (!) 113/59 (BP Location: Right Arm)   Pulse 62   Temp 98.3 F (36.8 C) (Oral)   Resp 17   SpO2 98%   Visual Acuity Right Eye Distance:   Left Eye Distance:   Bilateral Distance:    Right Eye Near:   Left Eye Near:    Bilateral Near:     Physical Exam Vitals reviewed.  Constitutional:      General: He is not in acute distress.    Appearance: He is well-developed. He is not ill-appearing.  HENT:     Head: Normocephalic and atraumatic.     Right Ear: Hearing, tympanic membrane, ear canal and external ear normal. No drainage or tenderness. No middle ear effusion. There is no impacted cerumen. No mastoid tenderness. Tympanic membrane is not perforated, erythematous, retracted or bulging.     Left Ear: Hearing, ear canal and external ear normal. No drainage or tenderness.  No middle  ear effusion. There is no impacted cerumen. No mastoid tenderness. Tympanic membrane is erythematous and bulging. Tympanic membrane is not perforated or retracted.     Nose: Congestion present.     Right Sinus: Maxillary sinus tenderness present. No frontal sinus tenderness.     Left Sinus: Maxillary sinus tenderness present. No frontal sinus tenderness.     Mouth/Throat:     Pharynx: Uvula midline. Posterior oropharyngeal erythema present. No oropharyngeal exudate.     Tonsils: No tonsillar  exudate or tonsillar abscesses.  Cardiovascular:     Rate and Rhythm: Normal rate and regular rhythm.     Heart sounds: Normal heart sounds.  Pulmonary:     Effort: Pulmonary effort is normal.     Breath sounds: Normal breath sounds and air entry. No wheezing.  Abdominal:     General: Abdomen is flat. Bowel sounds are normal.     Palpations: Abdomen is soft.     Tenderness: There is no abdominal tenderness. There is no right CVA tenderness, left CVA tenderness, guarding or rebound. Negative signs include Murphy's sign, Rovsing's sign and McBurney's sign.  Lymphadenopathy:     Cervical: No cervical adenopathy.  Neurological:     General: No focal deficit present.     Mental Status: He is alert.  Psychiatric:        Attention and Perception: Attention and perception normal.        Mood and Affect: Mood and affect normal.        Behavior: Behavior is cooperative.      UC Treatments / Results  Labs (all labs ordered are listed, but only abnormal results are displayed) Labs Reviewed  RESP PANEL BY RT-PCR (FLU A&B, COVID) [id number]    EKG   Radiology No results found.  Procedures Procedures (including critical care time)  Medications Ordered in UC Medications - No data to display  Initial Impression / Assessment and Plan / UC Course  I have reviewed the triage vital signs and the nursing notes.  Pertinent labs & imaging results that were available during my care of the patient were  reviewed by me and considered in my medical decision making (see chart for details).     Covid and influenza tests sent today. Patient is not vaccinated for covid-19. Isolation precautions per CDC guidelines until negative result. Symptomatic relief with OTC Mucinex, Nyquil, etc. Also sent script of tessalon and augmentin for sinusitis/AOM. Return precautions- new/worsening fevers/chills, shortness of breath, chest pain, abd pain, etc.    Final Clinical Impressions(s) / UC Diagnoses   Final diagnoses:  Viral URI with cough  Acute non-recurrent maxillary sinusitis     Discharge Instructions     For your sinus infection, start the augmentin. You can also use Tessalon for the cough. You can try Mucinex, Nyquil, etc for congestion.   We'll call you if the result of your covid/flu tests are positive.     ED Prescriptions    Medication Sig Dispense Auth. Provider   benzonatate (TESSALON) 100 MG capsule Take 1 capsule (100 mg total) by mouth every 8 (eight) hours. 21 capsule Rhys Martini, PA-C   amoxicillin-clavulanate (AUGMENTIN) 875-125 MG tablet Take 1 tablet by mouth every 12 (twelve) hours. 14 tablet Rhys Martini, PA-C     PDMP not reviewed this encounter.   Rhys Martini, PA-C 05/02/20 2024

## 2020-05-12 ENCOUNTER — Encounter (HOSPITAL_COMMUNITY): Payer: Self-pay | Admitting: Emergency Medicine

## 2020-05-12 ENCOUNTER — Other Ambulatory Visit: Payer: Self-pay

## 2020-05-12 ENCOUNTER — Emergency Department (HOSPITAL_COMMUNITY)
Admission: EM | Admit: 2020-05-12 | Discharge: 2020-05-12 | Disposition: A | Discharge status: Home / Self Care | Payer: BC Managed Care – PPO | Attending: Emergency Medicine | Admitting: Emergency Medicine

## 2020-05-12 DIAGNOSIS — J019 Acute sinusitis, unspecified: Secondary | ICD-10-CM | POA: Diagnosis not present

## 2020-05-12 DIAGNOSIS — Z5321 Procedure and treatment not carried out due to patient leaving prior to being seen by health care provider: Secondary | ICD-10-CM | POA: Diagnosis not present

## 2020-05-12 DIAGNOSIS — M791 Myalgia, unspecified site: Secondary | ICD-10-CM | POA: Insufficient documentation

## 2020-05-12 DIAGNOSIS — R059 Cough, unspecified: Secondary | ICD-10-CM | POA: Diagnosis not present

## 2020-05-12 NOTE — ED Notes (Signed)
Pt approached nurses station asking if he can just be covid tested. C/o cough, body aches x few weeks. Explained to pt that a provider will be in to see him as soon as they can and to please wait in room. Pt returned to room.

## 2020-05-12 NOTE — ED Notes (Signed)
Attempted to obtain oral tep. No read.

## 2020-05-12 NOTE — ED Notes (Signed)
Pt noted to be walking down the hall past the nurses station. He asked which way is out. I asked if he was going to stay and he said no. Directions provided to pt. Pt refused discharge VS.

## 2020-05-12 NOTE — ED Triage Notes (Signed)
Pt reports couple of weeks of cough, bodyaches. Reports recently treated for sinus infection. Was tested for COVID but didn't get results. No distress noted.

## 2020-05-19 ENCOUNTER — Ambulatory Visit (HOSPITAL_COMMUNITY)
Admission: EM | Admit: 2020-05-19 | Discharge: 2020-05-19 | Disposition: A | Discharge status: Home / Self Care | Payer: BC Managed Care – PPO | Attending: Emergency Medicine | Admitting: Emergency Medicine

## 2020-05-19 ENCOUNTER — Encounter (HOSPITAL_COMMUNITY): Payer: Self-pay

## 2020-05-19 ENCOUNTER — Other Ambulatory Visit: Payer: Self-pay

## 2020-05-19 DIAGNOSIS — R52 Pain, unspecified: Secondary | ICD-10-CM | POA: Insufficient documentation

## 2020-05-19 DIAGNOSIS — J029 Acute pharyngitis, unspecified: Secondary | ICD-10-CM | POA: Diagnosis present

## 2020-05-19 DIAGNOSIS — Z20822 Contact with and (suspected) exposure to covid-19: Secondary | ICD-10-CM | POA: Diagnosis not present

## 2020-05-19 DIAGNOSIS — R197 Diarrhea, unspecified: Secondary | ICD-10-CM | POA: Diagnosis not present

## 2020-05-19 LAB — SARS CORONAVIRUS 2 (TAT 6-24 HRS): SARS Coronavirus 2: NEGATIVE

## 2020-05-19 MED ORDER — ONDANSETRON 4 MG PO TBDP: 4.0000 mg | ORAL_TABLET | Freq: Three times a day (TID) | ORAL | 0 refills | Status: DC | PRN

## 2020-05-19 NOTE — ED Provider Notes (Signed)
[facility]    CSN: [id number] Arrival date & time: 05/19/20  1130      History   Chief Complaint Chief Complaint  Patient presents with  . Nausea  . Emesis  . Diarrhea  . Generalized Body Aches    HPI [name] is a 25 y.o. male.   [name] presents with complaints of diarrhea which he has had for the past 2 weeks. Started out with constipation. He has been taking laxatives, took them today even. No blood or black to stool. No vomiting today but has had some episodes of vomiting. Abdominal bloating. No known fevers. Today with sore throat and body aches. Some cough and congestion. He states his girlfriend tested positive for covid last week. No history of covid-19 and has not received vaccination. He is seeking covid testing. He was recently treated with antibiotics for sinusitis but he did not complete these or take regularly.    ROS per HPI, negative if not otherwise mentioned.      Past Medical History:  Diagnosis Date  . ADD (attention deficit disorder)   . 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 Anemia   . Anxiety   . Bipolar disorder (HCC)   . Borderline personality disorder (HCC)   . Central auditory processing disorder   . Deliberate self-cutting   . Depressed   . Eczema   . Headache(784.0)   . Oppositional defiant disorder   . Schizophrenia [facility])     Patient Active Problem List   Diagnosis Date Noted  . Schizoaffective disorder, bipolar type (HCC) 09/16/2016  . Eating disorder Unspecified 05/22/2016  . Borderline personality disorder (HCC) 05/16/2016  . Tobacco use disorder 05/15/2016    Past Surgical History:  Procedure Laterality Date  . BACK SURGERY    . FRACTURE SURGERY    . NO PAST SURGERIES    . POSTERIOR LUMBAR FUSION N/A 02/19/2015   Procedure: LATERAL L-2 CORPECTOMY;  Surgeon: Lisbeth Renshaw, MD;  Location: [facility];  Service: Neurosurgery;  Laterality: N/A;   . POSTERIOR LUMBAR FUSION 4 LEVEL  02/19/2015   Procedure: Posterior T-12 - L-4 STABILIZATION OF POSTERIOR LUMBAR;  Surgeon: Lisbeth Renshaw, MD;  Location: [facility];  Service: Neurosurgery;;  . TIBIA IM NAIL INSERTION Left 02/20/2015   Procedure: INTRAMEDULLARY (IM) NAIL LEFT TIBIAL;  Surgeon: [name], MD;  Location: [facility];  Service: Orthopedics;  Laterality: Left;       Home Medications    Prior to Admission medications   Medication Sig Start Date End Date Taking? Authorizing Provider  ondansetron (ZOFRAN-ODT) 4 MG disintegrating tablet Take 1 tablet (4 mg total) by mouth every 8 (eight) hours as needed for nausea or vomiting. 05/19/20  Yes Linus Mako B, NP  amoxicillin-clavulanate (AUGMENTIN) 875-125 MG tablet Take 1 tablet by mouth every 12 (twelve) hours. 05/02/20   Rhys Martini, PA-C  benzonatate (TESSALON) 100 MG capsule Take 1 capsule (100 mg total) by mouth every 8 (eight) hours. 05/02/20   Rhys Martini, PA-C  benztropine (COGENTIN) 1 MG tablet Take 1 tablet (1 mg total) by mouth 2 (two) times daily. Patient not taking: Reported on 02/03/2020 05/04/19   Malvin Johns, MD  gabapentin (NEURONTIN) 300 MG capsule Take 1 capsule (300 mg total) by mouth 3 (three) times daily. Patient not taking: Reported on 02/03/2020 05/04/19   Malvin Johns, MD  haloperidol (HALDOL) 10 MG tablet Take 10-20 mg by mouth 2 (two) times daily. Take one 10  mg tablet by mouth once daily and two tablets (20 mg) at bedtime    [provider]  haloperidol decanoate (HALDOL DECANOATE) 50 MG/ML injection Inject 4 mLs (200 mg total) into the muscle every 28 (twenty-eight) days. Due 05/29/2019 Patient not taking: Reported on 02/03/2020 05/04/19   Johnn Hai, MD  lithium carbonate (ESKALITH) 450 MG CR tablet Take 1 tablet (450 mg total) by mouth every 12 (twelve) hours. Patient not taking: Reported on 02/03/2020 05/04/19   Johnn Hai, MD  topiramate (TOPAMAX) 50 MG tablet Take 1 tablet (50 mg  total) by mouth 2 (two) times daily. Patient not taking: Reported on 02/03/2020 05/04/19   Johnn Hai, MD  traZODone (DESYREL) 150 MG tablet Take 1 tablet (150 mg total) by mouth at bedtime. Patient not taking: Reported on 02/03/2020 03/09/19   Johnn Hai, MD    Family History History reviewed. No pertinent family history.  Social History Social History   Tobacco Use  . Smoking status: Current Every Day Smoker    Packs/day: 2.00    Types: Cigarettes  . Smokeless tobacco: Never Used  . Tobacco comment: 1-3 packs   Vaping Use  . Vaping Use: Some days  Substance Use Topics  . Alcohol use: Yes    Alcohol/week: 2.0 standard drinks    Types: 2 Shots of liquor per week  . Drug use: Yes    Types: Cocaine, Marijuana, Benzodiazepines, Heroin    Comment: heroin     Allergies   Clozapine, Prolixin [fluphenazine], and Risperdal [risperidone]   Review of Systems Review of Systems   Physical Exam Triage Vital Signs ED Triage Vitals  Enc Vitals Group     BP 05/19/20 1324 (!) 106/42     Pulse Rate 05/19/20 1324 68     Resp 05/19/20 1324 17     Temp 05/19/20 1324 98.3 F (36.8 C)     Temp Source 05/19/20 1324 Oral     SpO2 05/19/20 1324 100 %     Weight --      Height --      Head Circumference --      Peak Flow --      Pain Score 05/19/20 1322 7     Pain Loc --      Pain Edu? --      Excl. in [place]? --    No data found.  Updated Vital Signs BP (!) 106/42   Pulse 68   Temp 98.3 F (36.8 C) (Oral)   Resp 17   SpO2 100%    Physical Exam Constitutional:      Appearance: He is well-developed.  HENT:     Head: Normocephalic.     Nose: Nose normal.     Mouth/Throat:     Mouth: Mucous membranes are moist.     Pharynx: No oropharyngeal exudate or posterior oropharyngeal erythema.  Cardiovascular:     Rate and Rhythm: Normal rate.  Pulmonary:     Effort: Pulmonary effort is normal.  Abdominal:     Tenderness: There is no abdominal tenderness. There is no  guarding.  Skin:    General: Skin is warm and dry.  Neurological:     Mental Status: He is alert and oriented to person, place, and time.      UC Treatments / Results  Labs (all labs ordered are listed, but only abnormal results are displayed) Labs Reviewed  SARS CORONAVIRUS 2 (TAT 6-24 HRS)    EKG   Radiology No results found.  Procedures Procedures (including critical care time)  Medications Ordered in UC Medications - No data to display  Initial Impression / Assessment and Plan / UC Course  I have reviewed the triage vital signs and the nursing notes.  Pertinent labs & imaging results that were available during my care of the patient were reviewed by me and considered in my medical decision making (see chart for details).     Diarrhea, taking laxatives, however. States he was exposed to covid-19 last week. New URI symptoms today. Non toxic in appearance. Primarily requesting covid testing, which is provided today and pending. Supportive cares recommended at this time. Return precautions provided. Patient verbalized understanding and agreeable to plan.   Final Clinical Impressions(s) / UC Diagnoses   Final diagnoses:  Diarrhea, unspecified type  Body aches  Sore throat     Discharge Instructions     Stop taking laxatives.  Small frequent sips of fluids- Pedialyte, Gatorade, water, broth- to maintain hydration.   Throat lozenges, gargles, chloraseptic spray, warm teas, popsicles etc to help with throat pain.  Tylenol and/or ibuprofen as needed for pain or fevers.   Over the counter medications as needed for symptoms.  Self isolate until covid results are back.  We will notify you by phone if it is positive. Your negative results will be sent through your MyChart.    If it is positive you need to isolate from others for a total of 5 days. If no fever for 24 hours without medications, and symptoms improving you may end isolation on day 6, but wear a mask if  around any others for an additional 5 days.       ED Prescriptions    Medication Sig Dispense Auth. Provider   ondansetron (ZOFRAN-ODT) 4 MG disintegrating tablet Take 1 tablet (4 mg total) by mouth every 8 (eight) hours as needed for nausea or vomiting. 12 tablet Georgetta Haber, NP     PDMP not reviewed this encounter.   Georgetta Haber, NP 05/19/20 1419

## 2020-05-19 NOTE — Discharge Instructions (Addendum)
Stop taking laxatives.  Small frequent sips of fluids- Pedialyte, Gatorade, water, broth- to maintain hydration.   Throat lozenges, gargles, chloraseptic spray, warm teas, popsicles etc to help with throat pain.  Tylenol and/or ibuprofen as needed for pain or fevers.   Over the counter medications as needed for symptoms.  Self isolate until covid results are back.  We will notify you by phone if it is positive. Your negative results will be sent through your MyChart.    If it is positive you need to isolate from others for a total of 5 days. If no fever for 24 hours without medications, and symptoms improving you may end isolation on day 6, but wear a mask if around any others for an additional 5 days.

## 2020-05-19 NOTE — ED Triage Notes (Signed)
Pt in with c/o N/V/D, ST, body aches that have been going on for over 1 week but worsened today  Pt has not had medication for sxs

## 2020-06-03 ENCOUNTER — Other Ambulatory Visit: Payer: Self-pay

## 2020-06-03 ENCOUNTER — Ambulatory Visit (HOSPITAL_COMMUNITY)
Admission: EM | Admit: 2020-06-03 | Discharge: 2020-06-03 | Discharge status: Against Medical Advice | Payer: BC Managed Care – PPO

## 2020-06-03 ENCOUNTER — Emergency Department (HOSPITAL_COMMUNITY)
Admission: EM | Admit: 2020-06-03 | Discharge: 2020-06-03 | Discharge status: Against Medical Advice | Payer: BC Managed Care – PPO

## 2020-06-25 ENCOUNTER — Encounter (HOSPITAL_COMMUNITY): Payer: Self-pay | Admitting: Emergency Medicine

## 2020-06-25 ENCOUNTER — Other Ambulatory Visit: Payer: Self-pay

## 2020-06-25 ENCOUNTER — Emergency Department (HOSPITAL_COMMUNITY)
Admission: EM | Admit: 2020-06-25 | Discharge: 2020-06-25 | Disposition: A | Discharge status: Home / Self Care | Payer: BC Managed Care – PPO | Attending: Emergency Medicine | Admitting: Emergency Medicine

## 2020-06-25 DIAGNOSIS — H53149 Visual discomfort, unspecified: Secondary | ICD-10-CM | POA: Diagnosis not present

## 2020-06-25 DIAGNOSIS — R630 Anorexia: Secondary | ICD-10-CM | POA: Diagnosis not present

## 2020-06-25 DIAGNOSIS — R5383 Other fatigue: Secondary | ICD-10-CM | POA: Insufficient documentation

## 2020-06-25 DIAGNOSIS — Z5321 Procedure and treatment not carried out due to patient leaving prior to being seen by health care provider: Secondary | ICD-10-CM | POA: Insufficient documentation

## 2020-06-25 DIAGNOSIS — R519 Headache, unspecified: Secondary | ICD-10-CM | POA: Insufficient documentation

## 2020-06-25 DIAGNOSIS — Z20822 Contact with and (suspected) exposure to covid-19: Secondary | ICD-10-CM | POA: Diagnosis not present

## 2020-06-25 NOTE — ED Notes (Signed)
Pt also requesting STD testing.

## 2020-06-25 NOTE — ED Triage Notes (Addendum)
Pt states, "I have flu like symptoms."  Reports light sensitivity, fatigue, INCREASED appetite, headache, and generalized body aches x 5 days.

## 2020-06-25 NOTE — ED Notes (Signed)
Pt state that he is going to leave and check back later

## 2020-06-26 ENCOUNTER — Encounter (HOSPITAL_COMMUNITY): Payer: Self-pay | Admitting: *Deleted

## 2020-06-26 ENCOUNTER — Emergency Department (HOSPITAL_COMMUNITY)
Admission: EM | Admit: 2020-06-26 | Discharge: 2020-06-26 | Disposition: A | Discharge status: Home / Self Care | Payer: BC Managed Care – PPO | Attending: Emergency Medicine | Admitting: Emergency Medicine

## 2020-06-26 ENCOUNTER — Other Ambulatory Visit: Payer: Self-pay

## 2020-06-26 DIAGNOSIS — R300 Dysuria: Secondary | ICD-10-CM | POA: Diagnosis present

## 2020-06-26 DIAGNOSIS — F17210 Nicotine dependence, cigarettes, uncomplicated: Secondary | ICD-10-CM | POA: Insufficient documentation

## 2020-06-26 DIAGNOSIS — N342 Other urethritis: Secondary | ICD-10-CM | POA: Diagnosis not present

## 2020-06-26 LAB — URINALYSIS, ROUTINE W REFLEX MICROSCOPIC
Bilirubin Urine: NEGATIVE
Glucose, UA: NEGATIVE mg/dL
Hgb urine dipstick: NEGATIVE
Ketones, ur: NEGATIVE mg/dL
Nitrite: NEGATIVE
Protein, ur: NEGATIVE mg/dL
Specific Gravity, Urine: 1.019 (ref 1.005–1.030)
pH: 7 (ref 5.0–8.0)

## 2020-06-26 LAB — SARS CORONAVIRUS 2 (TAT 6-24 HRS): SARS Coronavirus 2: NEGATIVE

## 2020-06-26 MED ORDER — CEFTRIAXONE SODIUM 500 MG IJ SOLR
500.0000 mg | Freq: Once | INTRAMUSCULAR | Status: AC
  Administered 2020-06-26: 500 mg via INTRAMUSCULAR
  Filled 2020-06-26: qty 500

## 2020-06-26 MED ORDER — LIDOCAINE HCL (PF) 1 % IJ SOLN
INTRAMUSCULAR | Status: AC
  Administered 2020-06-26: 1 mL
  Filled 2020-06-26: qty 5

## 2020-06-26 MED ORDER — DOXYCYCLINE HYCLATE 100 MG PO CAPS: 100.0000 mg | ORAL_CAPSULE | Freq: Two times a day (BID) | ORAL | 0 refills | Status: DC

## 2020-06-26 NOTE — ED Notes (Signed)
Patient verbalizes understanding of discharge instructions. Opportunity for questioning and answers were provided. Armband removed by staff, pt discharged from ED and ambulated to lobby to return home.   

## 2020-06-26 NOTE — ED Triage Notes (Signed)
Pt reports having fatigue and "genital" pain. Was here yesterday and lwbs. No acute distress noted at triage.

## 2020-06-26 NOTE — ED Provider Notes (Addendum)
[name] DEPARTMENT Provider Note  CSN: [id number] Arrival date & time: 06/26/20 0737    History Chief Complaint  Patient presents with  . Penis Pain  . Fatigue    HPI  [name] is a 25 y.o. male presents for evaluation of dysuria. Reports several days of pain to the tip of his penis, worse with urination and associated with urinary frequency. He has not had any penile discharge or sores. No testicular pain or swelling. He also reports he feels fatigued but he was in the ED yesterday and swabbed for Covid which was negative. He LWBS yesterday but he does not want any further ED workup for that symptom today.    Past Medical History:  Diagnosis Date  . ADD (attention deficit disorder)   . 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 Anemia   . Anxiety   . Bipolar disorder (HCC)   . Borderline personality disorder (HCC)   . Central auditory processing disorder   . Deliberate self-cutting   . Depressed   . Eczema   . Headache(784.0)   . Oppositional defiant disorder   . Schizophrenia [facility])     Past Surgical History:  Procedure Laterality Date  . BACK SURGERY    . FRACTURE SURGERY    . NO PAST SURGERIES    . POSTERIOR LUMBAR FUSION N/A 02/19/2015   Procedure: LATERAL L-2 CORPECTOMY;  Surgeon: Lisbeth Renshaw, MD;  Location: [facility] OR;  Service: Neurosurgery;  Laterality: N/A;  . POSTERIOR LUMBAR FUSION 4 LEVEL  02/19/2015   Procedure: Posterior T-12 - L-4 STABILIZATION OF POSTERIOR LUMBAR;  Surgeon: Lisbeth Renshaw, MD;  Location: [facility] OR;  Service: Neurosurgery;;  . TIBIA IM NAIL INSERTION Left 02/20/2015   Procedure: INTRAMEDULLARY (IM) NAIL LEFT TIBIAL;  Surgeon: [name], MD;  Location: [facility] OR;  Service: Orthopedics;  Laterality: Left;    History reviewed. No pertinent family history.  Social History   Tobacco Use  . Smoking status: Current Every Day Smoker    Packs/day: 2.00    Types: Cigarettes   . Smokeless tobacco: Never Used  . Tobacco comment: 1-3 packs   Vaping Use  . Vaping Use: Some days  Substance Use Topics  . Alcohol use: Yes    Alcohol/week: 2.0 standard drinks    Types: 2 Shots of liquor per week  . Drug use: Yes    Types: Cocaine, Marijuana, Benzodiazepines, Heroin    Comment: heroin     Home Medications Prior to Admission medications   Medication Sig Start Date End Date Taking? Authorizing Provider  doxycycline (VIBRAMYCIN) 100 MG capsule Take 1 capsule (100 mg total) by mouth 2 (two) times daily. 06/26/20  Yes Pollyann Savoy, MD  benztropine (COGENTIN) 1 MG tablet Take 1 tablet (1 mg total) by mouth 2 (two) times daily. Patient not taking: Reported on 02/03/2020 05/04/19   Malvin Johns, MD  gabapentin (NEURONTIN) 300 MG capsule Take 1 capsule (300 mg total) by mouth 3 (three) times daily. Patient not taking: Reported on 02/03/2020 05/04/19   Malvin Johns, MD  haloperidol (HALDOL) 10 MG tablet Take 10-20 mg by mouth 2 (two) times daily. Take one 10 mg tablet by mouth once daily and two tablets (20 mg) at bedtime    [provider]  haloperidol decanoate (HALDOL DECANOATE) 50 MG/ML injection Inject 4 mLs (200 mg total) into the muscle every 28 (twenty-eight) days. Due 05/29/2019 Patient not taking: Reported on 02/03/2020 05/04/19   Malvin Johns,  MD  lithium carbonate (ESKALITH) 450 MG CR tablet Take 1 tablet (450 mg total) by mouth every 12 (twelve) hours. Patient not taking: Reported on 02/03/2020 05/04/19   Malvin Johns, MD  ondansetron (ZOFRAN-ODT) 4 MG disintegrating tablet Take 1 tablet (4 mg total) by mouth every 8 (eight) hours as needed for nausea or vomiting. 05/19/20   Georgetta Haber, NP  topiramate (TOPAMAX) 50 MG tablet Take 1 tablet (50 mg total) by mouth 2 (two) times daily. Patient not taking: Reported on 02/03/2020 05/04/19   Malvin Johns, MD  traZODone (DESYREL) 150 MG tablet Take 1 tablet (150 mg total) by mouth at bedtime. Patient not  taking: Reported on 02/03/2020 03/09/19   Malvin Johns, MD     Allergies    Clozapine, Prolixin [fluphenazine], and Risperdal [risperidone]   Review of Systems   Review of Systems A comprehensive review of systems was completed and negative except as noted in HPI.    Physical Exam BP 119/60   Pulse (!) 53   Temp (!) 97.5 F (36.4 C) (Oral)   Resp 18   SpO2 100%   Physical Exam Vitals and nursing note reviewed.  Constitutional:      Appearance: Normal appearance.  HENT:     Head: Normocephalic and atraumatic.     Nose: Nose normal.     Mouth/Throat:     Mouth: Mucous membranes are moist.  Eyes:     Extraocular Movements: Extraocular movements intact.     Conjunctiva/sclera: Conjunctivae normal.  Cardiovascular:     Rate and Rhythm: Normal rate.  Pulmonary:     Effort: Pulmonary effort is normal.     Breath sounds: Normal breath sounds.  Abdominal:     General: Abdomen is flat.     Palpations: Abdomen is soft.     Tenderness: There is no abdominal tenderness.  Genitourinary:    Penis: Normal.      Testes: Normal.     Comments: No sores or discharge Musculoskeletal:        General: No swelling. Normal range of motion.     Cervical back: Neck supple.  Skin:    General: Skin is warm and dry.  Neurological:     General: No focal deficit present.     Mental Status: He is alert.  Psychiatric:        Mood and Affect: Mood normal.      ED Results / Procedures / Treatments   Labs (all labs ordered are listed, but only abnormal results are displayed) Labs Reviewed  URINALYSIS, ROUTINE W REFLEX MICROSCOPIC - Abnormal; Notable for the following components:      Result Value   APPearance HAZY (*)    Leukocytes,Ua TRACE (*)    Bacteria, UA FEW (*)    All other components within normal limits  GC/CHLAMYDIA PROBE AMP ([place]) NOT AT [facility]    EKG None  Radiology No results found.  Procedures Procedures  Medications Ordered in the ED Medications   cefTRIAXone (ROCEPHIN) injection 500 mg (has no administration in time range)     MDM Rules/Calculators/A&P MDM UA and GC/CT ordered.   ED Course  I have reviewed the triage vital signs and the nursing notes.  Pertinent labs & imaging results that were available during my care of the patient were reviewed by me and considered in my medical decision making (see chart for details).  Clinical Course as of 06/26/20 0935  Mon Jun 26, 2020  0930 Patient now reporting discharge  from his penis. No trichomonas on UA. Will plan Rocephin IM and Rx for doxycycline. Advised to abstain from sex for at least a week following treatment. Advised to have all his sexual partners tested and treated as well, referred to [facility].  [CS]    Clinical Course User Index [CS] Pollyann Savoy, MD    Final Clinical Impression(s) / ED Diagnoses Final diagnoses:  Urethritis    Rx / DC Orders ED Discharge Orders         Ordered    doxycycline (VIBRAMYCIN) 100 MG capsule  2 times daily        06/26/20 0934             Pollyann Savoy, MD 06/26/20 [phone]

## 2020-06-27 LAB — GC/CHLAMYDIA PROBE AMP (~~LOC~~) NOT AT ARMC
Chlamydia: NEGATIVE
Comment: NEGATIVE
Comment: NORMAL
Neisseria Gonorrhea: POSITIVE — AB

## 2020-06-28 ENCOUNTER — Encounter (HOSPITAL_COMMUNITY): Payer: Self-pay

## 2020-06-28 ENCOUNTER — Ambulatory Visit (HOSPITAL_COMMUNITY)
Admission: EM | Admit: 2020-06-28 | Discharge: 2020-06-28 | Disposition: A | Discharge status: Home / Self Care | Payer: BC Managed Care – PPO | Attending: Family Medicine | Admitting: Family Medicine

## 2020-06-28 ENCOUNTER — Other Ambulatory Visit: Payer: Self-pay

## 2020-06-28 DIAGNOSIS — R82998 Other abnormal findings in urine: Secondary | ICD-10-CM | POA: Diagnosis not present

## 2020-06-28 DIAGNOSIS — A549 Gonococcal infection, unspecified: Secondary | ICD-10-CM

## 2020-06-28 LAB — POCT URINALYSIS DIPSTICK, ED / UC
Bilirubin Urine: NEGATIVE
Glucose, UA: NEGATIVE mg/dL
Hgb urine dipstick: NEGATIVE
Ketones, ur: NEGATIVE mg/dL
Leukocytes,Ua: NEGATIVE
Nitrite: NEGATIVE
Protein, ur: 30 mg/dL — AB
Specific Gravity, Urine: 1.02 (ref 1.005–1.030)
Urobilinogen, UA: 0.2 mg/dL (ref 0.0–1.0)
pH: 8.5 — ABNORMAL HIGH (ref 5.0–8.0)

## 2020-06-28 NOTE — ED Provider Notes (Signed)
[facility] CARE [facility]ENTER   [id number] 06/28/20 Arrival Time: 1221  ASSESSMENT & PLAN:  1. Dark urine   2. Gonorrhea       Discharge Instructions     You urine did not show any signs of infection or blood. Please do your best to ensure adequate fluid intake.    Pending: Labs Reviewed  POCT URINALYSIS DIPSTICK, ED / UC - Abnormal; Notable for the following components:      Result Value   pH 8.5 (*)    Protein, ur 30 (*)    All other components within normal limits  POCT URINALYSIS DIPSTICK, ED / UC    Will notify of any positive results. Instructed to refrain from sexual activity for at least seven days.  Reviewed expectations re: course of current medical issues. Questions answered. Outlined signs and symptoms indicating need for more acute intervention. Patient verbalized understanding. After Visit Summary given.   SUBJECTIVE: Seen 2/14 in ED; tested + for gonorrhea; Rocephin given in ED. [name] is a 25 y.o. male who reports noting dark urine today. Has been eating a lot of beets; questions relation. Unsure if blood in urine. Normal urination without pain. No penile d/c. Afebrile. No abd or back pain.  OBJECTIVE:  Vitals:   06/28/20 1256  BP: (!) 109/51  Pulse: 72  Temp: 99 F (37.2 C)  TempSrc: Oral  SpO2: 100%    General appearance: alert, cooperative, appears stated age and no distress Throat: lips, mucosa, and tongue normal; teeth and gums normal Lungs: unlabored respirations; speaks full sentences without difficulty Back: no CVA tenderness; FROM at waist Abdomen: soft, non-tender GU: deferred Skin: warm and dry Psychological: alert and cooperative; normal mood and affect.  Results for orders placed or performed during the hospital encounter of 06/28/20  POC Urinalysis dipstick  Result Value Ref Range   Glucose, UA NEGATIVE NEGATIVE mg/dL   Bilirubin Urine NEGATIVE NEGATIVE   Ketones, ur NEGATIVE NEGATIVE mg/dL    Specific Gravity, Urine 1.020 1.005 - 1.030   Hgb urine dipstick NEGATIVE NEGATIVE   pH 8.5 (H) 5.0 - 8.0   Protein, ur 30 (A) NEGATIVE mg/dL   Urobilinogen, UA 0.2 0.0 - 1.0 mg/dL   Nitrite NEGATIVE NEGATIVE   Leukocytes,Ua NEGATIVE NEGATIVE    Labs Reviewed  POCT URINALYSIS DIPSTICK, ED / UC - Abnormal; Notable for the following components:      Result Value   pH 8.5 (*)    Protein, ur 30 (*)    All other components within normal limits  POCT URINALYSIS DIPSTICK, ED / UC    Allergies  Allergen Reactions  . Clozapine Hives and Other (See Comments)    Increased blood pressure  . Prolixin [Fluphenazine] Other (See Comments)    Hallucinations  . Risperdal [Risperidone] Other (See Comments)    Unknown    Past Medical History:  Diagnosis Date  . ADD (attention deficit disorder)   . 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 Anemia   . Anxiety   . Bipolar disorder (HCC)   . Borderline personality disorder (HCC)   . Central auditory processing disorder   . Deliberate self-cutting   . Depressed   . Eczema   . Headache(784.0)   . Oppositional defiant disorder   . Schizophrenia (HCC)    History reviewed. No pertinent family history. Social History   Socioeconomic History  . Marital status: Single    Spouse name: Not on file  . Number  of children: Not on file  . Years of education: Not on file  . Highest education level: Not on file  Occupational History  . Occupation: Unemployed  Tobacco Use  . Smoking status: Current Every Day Smoker    Packs/day: 2.00    Types: Cigarettes  . Smokeless tobacco: Never Used  . Tobacco comment: 1-3 packs   Vaping Use  . Vaping Use: Some days  Substance and Sexual Activity  . Alcohol use: Yes    Alcohol/week: 2.0 standard drinks    Types: 2 Shots of liquor per week  . Drug use: Yes    Types: Cocaine, Marijuana, Benzodiazepines, Heroin    Comment: heroin  . Sexual activity: Yes    Birth  control/protection: None    Comment: pt reluctant to answer questions and frequently stated " I dont know"  Other Topics Concern  . Not on file  Social History Narrative   ** Merged History Encounter **    Pt reported that he is unemployed, homeless as his mother is in rehab   Social Determinants of Health   Financial Resource Strain: Not on file  Food Insecurity: Not on file  Transportation Needs: Not on file  Physical Activity: Not on file  Stress: Not on file  Social Connections: Not on file  Intimate Partner Violence: Not on file          Mardella Layman, MD 06/28/20 1410

## 2020-06-28 NOTE — Discharge Instructions (Addendum)
You urine did not show any signs of infection or blood. Please do your best to ensure adequate fluid intake.

## 2020-06-28 NOTE — ED Triage Notes (Signed)
Pt reports noticing blood in his urine this morning. Pt states he has had lower abdominal and back pain X 2 days. Pt states he has been having penile discomfort and has been constantly urinating.

## 2020-07-16 ENCOUNTER — Encounter (HOSPITAL_COMMUNITY): Payer: Self-pay | Admitting: Emergency Medicine

## 2020-07-16 ENCOUNTER — Other Ambulatory Visit: Payer: Self-pay

## 2020-07-16 ENCOUNTER — Ambulatory Visit (HOSPITAL_COMMUNITY)
Admission: EM | Admit: 2020-07-16 | Discharge: 2020-07-16 | Disposition: A | Discharge status: Home / Self Care | Payer: BC Managed Care – PPO

## 2020-07-16 NOTE — ED Notes (Signed)
Call pt from in the waiting room and call pt cell phone number no answer

## 2020-08-05 ENCOUNTER — Encounter (HOSPITAL_COMMUNITY): Payer: Self-pay

## 2020-08-05 ENCOUNTER — Other Ambulatory Visit: Payer: Self-pay

## 2020-08-05 ENCOUNTER — Ambulatory Visit (HOSPITAL_COMMUNITY)
Admission: EM | Admit: 2020-08-05 | Discharge: 2020-08-05 | Disposition: A | Discharge status: Home / Self Care | Payer: BC Managed Care – PPO | Attending: Emergency Medicine | Admitting: Emergency Medicine

## 2020-08-05 DIAGNOSIS — J029 Acute pharyngitis, unspecified: Secondary | ICD-10-CM

## 2020-08-05 LAB — POCT RAPID STREP A, ED / UC: Streptococcus, Group A Screen (Direct): NEGATIVE

## 2020-08-05 MED ORDER — IBUPROFEN 600 MG PO TABS: 600.0000 mg | ORAL_TABLET | Freq: Four times a day (QID) | ORAL | 0 refills | Status: DC | PRN

## 2020-08-05 NOTE — ED Provider Notes (Signed)
[facility]    CSN: [id number] Arrival date & time: 08/05/20  1007      History   Chief Complaint Chief Complaint  Patient presents with  . Sore Throat    HPI [name] is a 25 y.o. male.   Patient presents with 1.5 week history of sore throat.  He denies fever, rash, congestion, runny nose, cough, shortness of breath, or other symptoms.  Treatment attempted at home with OTC cold and allergy medication.  His medical history includes borderline personality disorder, schizoaffective disorder, eating disorder, tobacco use disorder, deliberate self cutting, oppositional defiant disorder, depression, anxiety, ADHD, schizophrenia, ADD, central auditory processing disorder, anemia, eczema.  The history is provided by the patient and medical records.    Past Medical History:  Diagnosis Date  . ADD (attention deficit disorder)   . 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 Anemia   . Anxiety   . Bipolar disorder (HCC)   . Borderline personality disorder (HCC)   . Central auditory processing disorder   . Deliberate self-cutting   . Depressed   . Eczema   . Headache(784.0)   . Oppositional defiant disorder   . Schizophrenia [facility])     Patient Active Problem List   Diagnosis Date Noted  . Schizoaffective disorder, bipolar type (HCC) 09/16/2016  . Eating disorder Unspecified 05/22/2016  . Borderline personality disorder (HCC) 05/16/2016  . Tobacco use disorder 05/15/2016    Past Surgical History:  Procedure Laterality Date  . BACK SURGERY    . FRACTURE SURGERY    . NO PAST SURGERIES    . POSTERIOR LUMBAR FUSION N/A 02/19/2015   Procedure: LATERAL L-2 CORPECTOMY;  Surgeon: Lisbeth Renshaw, MD;  Location: [facility] OR;  Service: Neurosurgery;  Laterality: N/A;  . POSTERIOR LUMBAR FUSION 4 LEVEL  02/19/2015   Procedure: Posterior T-12 - L-4 STABILIZATION OF POSTERIOR LUMBAR;  Surgeon: Lisbeth Renshaw, MD;   Location: [facility] OR;  Service: Neurosurgery;;  . TIBIA IM NAIL INSERTION Left 02/20/2015   Procedure: INTRAMEDULLARY (IM) NAIL LEFT TIBIAL;  Surgeon: [name], MD;  Location: [facility] OR;  Service: Orthopedics;  Laterality: Left;       Home Medications    Prior to Admission medications   Medication Sig Start Date End Date Taking? Authorizing Provider  ibuprofen (ADVIL) 600 MG tablet Take 1 tablet (600 mg total) by mouth every 6 (six) hours as needed. 08/05/20  Yes Mickie Bail, NP  benztropine (COGENTIN) 1 MG tablet Take 1 tablet (1 mg total) by mouth 2 (two) times daily. Patient not taking: Reported on 02/03/2020 05/04/19   Malvin Johns, MD  doxycycline (VIBRAMYCIN) 100 MG capsule Take 1 capsule (100 mg total) by mouth 2 (two) times daily. 06/26/20   Pollyann Savoy, MD  gabapentin (NEURONTIN) 300 MG capsule Take 1 capsule (300 mg total) by mouth 3 (three) times daily. Patient not taking: Reported on 02/03/2020 05/04/19   Malvin Johns, MD  haloperidol (HALDOL) 10 MG tablet Take 10-20 mg by mouth 2 (two) times daily. Take one 10 mg tablet by mouth once daily and two tablets (20 mg) at bedtime    [provider]  haloperidol decanoate (HALDOL DECANOATE) 50 MG/ML injection Inject 4 mLs (200 mg total) into the muscle every 28 (twenty-eight) days. Due 05/29/2019 Patient not taking: Reported on 02/03/2020 05/04/19   Malvin Johns, MD  lithium carbonate (ESKALITH) 450 MG CR tablet Take 1 tablet (450 mg total) by mouth every 12 (twelve)  hours. Patient not taking: Reported on 02/03/2020 05/04/19   Malvin Johns, MD  ondansetron (ZOFRAN-ODT) 4 MG disintegrating tablet Take 1 tablet (4 mg total) by mouth every 8 (eight) hours as needed for nausea or vomiting. 05/19/20   Georgetta Haber, NP  topiramate (TOPAMAX) 50 MG tablet Take 1 tablet (50 mg total) by mouth 2 (two) times daily. Patient not taking: Reported on 02/03/2020 05/04/19   Malvin Johns, MD  traZODone (DESYREL) 150 MG tablet Take 1 tablet  (150 mg total) by mouth at bedtime. Patient not taking: Reported on 02/03/2020 03/09/19   Malvin Johns, MD    Family History History reviewed. No pertinent family history.  Social History Social History   Tobacco Use  . Smoking status: Current Every Day Smoker    Packs/day: 2.00    Types: Cigarettes  . Smokeless tobacco: Never Used  . Tobacco comment: 1-3 packs   Vaping Use  . Vaping Use: Some days  Substance Use Topics  . Alcohol use: Yes    Alcohol/week: 2.0 standard drinks    Types: 2 Shots of liquor per week  . Drug use: Yes    Types: Cocaine, Marijuana, Benzodiazepines, Heroin    Comment: heroin     Allergies   Clozapine, Prolixin [fluphenazine], and Risperdal [risperidone]   Review of Systems Review of Systems  Constitutional: Negative for chills and fever.  HENT: Positive for sore throat. Negative for congestion, ear pain, rhinorrhea, trouble swallowing and voice change.   Eyes: Negative for pain and visual disturbance.  Respiratory: Negative for cough and shortness of breath.   Cardiovascular: Negative for chest pain and palpitations.  Gastrointestinal: Negative for abdominal pain, diarrhea and vomiting.  Genitourinary: Negative for dysuria and hematuria.  Musculoskeletal: Negative for arthralgias and back pain.  Skin: Negative for color change and rash.  Neurological: Negative for seizures and syncope.  All other systems reviewed and are negative.    Physical Exam Triage Vital Signs ED Triage Vitals  Enc Vitals Group     BP      Pulse      Resp      Temp      Temp src      SpO2      Weight      Height      Head Circumference      Peak Flow      Pain Score      Pain Loc      Pain Edu?      Excl. in GC?    No data found.  Updated Vital Signs BP 129/80   Pulse (!) 59   Temp 98.4 F (36.9 C)   Resp 17   SpO2 100%   Visual Acuity Right Eye Distance:   Left Eye Distance:   Bilateral Distance:    Right Eye Near:   Left Eye Near:     Bilateral Near:     Physical Exam Vitals and nursing note reviewed.  Constitutional:      General: He is not in acute distress.    Appearance: He is well-developed. He is not ill-appearing.  HENT:     Head: Normocephalic and atraumatic.     Right Ear: Tympanic membrane normal.     Left Ear: Tympanic membrane normal.     Nose: Nose normal.     Mouth/Throat:     Mouth: Mucous membranes are moist.     Pharynx: Posterior oropharyngeal erythema present. No oropharyngeal exudate.     Tonsils:  1+ on the right. 1+ on the left.  Eyes:     Conjunctiva/sclera: Conjunctivae normal.  Cardiovascular:     Rate and Rhythm: Normal rate and regular rhythm.     Heart sounds: Normal heart sounds.  Pulmonary:     Effort: Pulmonary effort is normal. No respiratory distress.     Breath sounds: Normal breath sounds.  Abdominal:     Palpations: Abdomen is soft.     Tenderness: There is no abdominal tenderness.  Musculoskeletal:     Cervical back: Neck supple.  Skin:    General: Skin is warm and dry.  Neurological:     General: No focal deficit present.     Mental Status: He is alert and oriented to person, place, and time.  Psychiatric:        Mood and Affect: Mood normal.        Behavior: Behavior normal.      UC Treatments / Results  Labs (all labs ordered are listed, but only abnormal results are displayed) Labs Reviewed  CULTURE, GROUP A STREP [facility])  POCT RAPID STREP A, ED / UC    EKG   Radiology No results found.  Procedures Procedures (including critical care time)  Medications Ordered in UC Medications - No data to display  Initial Impression / Assessment and Plan / UC Course  I have reviewed the triage vital signs and the nursing notes.  Pertinent labs & imaging results that were available during my care of the patient were reviewed by me and considered in my medical decision making (see chart for details).   Sore throat.  Rapid strep negative; culture pending.   Treating discomfort with ibuprofen.  Instructed patient to follow-up with his PCP if his symptoms are not improving.  He agrees to plan of care.   Final Clinical Impressions(s) / UC Diagnoses   Final diagnoses:  Sore throat     Discharge Instructions     Your rapid strep test is negative.  A throat culture is pending; we will call you if it is positive requiring treatment.    Follow up with your primary care provider if your symptoms are not improving.        ED Prescriptions    Medication Sig Dispense Auth. Provider   ibuprofen (ADVIL) 600 MG tablet Take 1 tablet (600 mg total) by mouth every 6 (six) hours as needed. 30 tablet Mickie Bail, NP     PDMP not reviewed this encounter.   Mickie Bail, NP 08/05/20 1053

## 2020-08-05 NOTE — Discharge Instructions (Signed)
Your rapid strep test is negative.  A throat culture is pending; we will call you if it is positive requiring treatment.    Follow up with your primary care provider if your symptoms are not improving.    

## 2020-08-05 NOTE — ED Triage Notes (Signed)
Pt in with c/o ST that has been going on for over 1 week. States his throat feels swollen  Pt states he has taken cold and allergy medicine but can't remember the name

## 2020-08-07 LAB — CULTURE, GROUP A STREP (THRC)

## 2020-10-12 ENCOUNTER — Emergency Department (HOSPITAL_COMMUNITY)
Admission: EM | Admit: 2020-10-12 | Discharge: 2020-10-12 | Discharge status: Against Medical Advice | Payer: BC Managed Care – PPO | Attending: Emergency Medicine | Admitting: Emergency Medicine

## 2020-10-12 ENCOUNTER — Encounter (HOSPITAL_COMMUNITY): Payer: Self-pay

## 2020-10-12 DIAGNOSIS — K921 Melena: Secondary | ICD-10-CM | POA: Diagnosis not present

## 2020-10-12 DIAGNOSIS — Z5321 Procedure and treatment not carried out due to patient leaving prior to being seen by health care provider: Secondary | ICD-10-CM | POA: Insufficient documentation

## 2020-10-12 NOTE — ED Triage Notes (Signed)
Pt reports bright red blood in stools after straining to go to the bathroom for the past week.

## 2020-10-12 NOTE — ED Provider Notes (Signed)
Emergency Medicine Provider Triage Evaluation Note  [name] , a 25 y.o. male  was evaluated in triage.  Pt complains of hemorrhoids and rectal bleeding.  Has been ongoing for a week.  Has tried prep-H without relief.  Review of Systems  Positive: Rectal bleeding Negative: fever  Physical Exam  BP 113/76 (BP Location: Left Arm)   Pulse 61   Temp 98.4 F (36.9 C) (Oral)   Resp 16   SpO2 99%  Gen:   Awake, no distress   Resp:  Normal effort  MSK:   Moves extremities without difficulty  Other:    Medical Decision Making  Medically screening exam initiated at 6:35 AM.  Appropriate orders placed.  [name] was informed that the remainder of the evaluation will be completed by another provider, this initial triage assessment does not replace that evaluation, and the importance of remaining in the ED until their evaluation is complete.     Roxy Horseman, PA-C 10/12/20 2229    Geoffery Lyons, MD 10/12/20 [phone]

## 2020-10-12 NOTE — ED Notes (Signed)
Pt left AMA without notifying staff. MSE was signed.

## 2020-10-15 ENCOUNTER — Ambulatory Visit (HOSPITAL_COMMUNITY)
Admission: EM | Admit: 2020-10-15 | Discharge: 2020-10-15 | Disposition: A | Discharge status: Home / Self Care | Payer: BC Managed Care – PPO

## 2020-10-15 ENCOUNTER — Encounter (HOSPITAL_COMMUNITY): Payer: Self-pay | Admitting: Emergency Medicine

## 2020-10-15 ENCOUNTER — Other Ambulatory Visit: Payer: Self-pay

## 2020-10-15 ENCOUNTER — Emergency Department (HOSPITAL_COMMUNITY)
Admission: EM | Admit: 2020-10-15 | Discharge: 2020-10-15 | Disposition: A | Discharge status: Home / Self Care | Payer: BC Managed Care – PPO | Attending: Emergency Medicine | Admitting: Emergency Medicine

## 2020-10-15 ENCOUNTER — Ambulatory Visit (HOSPITAL_COMMUNITY)
Admission: EM | Admit: 2020-10-15 | Discharge: 2020-10-15 | Disposition: A | Discharge status: Other Acute Inpatient Hospital | Payer: BC Managed Care – PPO

## 2020-10-15 DIAGNOSIS — K644 Residual hemorrhoidal skin tags: Secondary | ICD-10-CM

## 2020-10-15 DIAGNOSIS — F250 Schizoaffective disorder, bipolar type: Secondary | ICD-10-CM

## 2020-10-15 DIAGNOSIS — K625 Hemorrhage of anus and rectum: Secondary | ICD-10-CM

## 2020-10-15 DIAGNOSIS — F22 Delusional disorders: Secondary | ICD-10-CM | POA: Diagnosis not present

## 2020-10-15 DIAGNOSIS — Z5321 Procedure and treatment not carried out due to patient leaving prior to being seen by health care provider: Secondary | ICD-10-CM | POA: Insufficient documentation

## 2020-10-15 MED ORDER — DOCUSATE SODIUM 100 MG PO CAPS: 100.0000 mg | ORAL_CAPSULE | Freq: Two times a day (BID) | ORAL | 0 refills | Status: DC

## 2020-10-15 MED ORDER — HYDROCORTISONE (PERIANAL) 2.5 % EX CREA: 1.0000 "application " | TOPICAL_CREAM | Freq: Two times a day (BID) | CUTANEOUS | 0 refills | Status: DC

## 2020-10-15 NOTE — ED Provider Notes (Signed)
[facility]    CSN: [id number] Arrival date & time: 10/15/20  1418      History   Chief Complaint Chief Complaint  Patient presents with  . Rectal Bleeding    HPI [name] is a 25 y.o. male presenting with external hemorrhoid.  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 He was escorted to behavioral health UC by law enforcement.  While there he admitted to also having rectal bleeding, abdominal pain, dizziness, weakness.  They noted that his pulse was 49 and so he was transferred to the emergency department for further evaluation. Behavioral health UC also discussed IVC, which guardian/mother was in agreement with.  While in ED, he was placed in triage and an IV was started.  He then ripped this out and left the hospital.  He is now presenting to this urgent care with continued complaint of rectal bleeding and rectal pain. States there is BRB in his stool. He now denies dizziness, abdominal pain.  He states he is constipated, though he did have a normal bowel movement this morning.  He is still passing gas.  States he typically has 2 bowel movements a day and he is only having 1 lately.  Denies nausea, vomiting, diarrhea, fever/chills, urinary symptoms.  He denies SI, HI, self-injurious behavior. Was last seen in ED for rectal bleeding 6/2. Taking prozac and haldol. He has been refusing his abilify injections.    HPI  Past Medical History:  Diagnosis Date  . ADD (attention deficit disorder)   . 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 Anemia   . Anxiety   . Bipolar disorder (HCC)   . Borderline personality disorder (HCC)   . Central auditory processing disorder   . Deliberate self-cutting   . Depressed   . Eczema   . Headache(784.0)   . Oppositional defiant disorder   . Schizophrenia [facility](HCC)     Patient Active Problem List   Diagnosis  Date Noted  . Schizoaffective disorder, bipolar type (HCC) 09/16/2016  . Eating disorder Unspecified 05/22/2016  . Borderline personality disorder (HCC) 05/16/2016  . Tobacco use disorder 05/15/2016    Past Surgical History:  Procedure Laterality Date  . BACK SURGERY    . FRACTURE SURGERY    . NO PAST SURGERIES    . POSTERIOR LUMBAR FUSION N/A 02/19/2015   Procedure: LATERAL L-2 CORPECTOMY;  Surgeon: Lisbeth RenshawNeelesh Nundkumar, MD;  Location: [facility];  Service: Neurosurgery;  Laterality: N/A;  . POSTERIOR LUMBAR FUSION 4 LEVEL  02/19/2015   Procedure: Posterior T-12 - L-4 STABILIZATION OF POSTERIOR LUMBAR;  Surgeon: Lisbeth RenshawNeelesh Nundkumar, MD;  Location: [facility];  Service: Neurosurgery;;  . TIBIA IM NAIL INSERTION Left 02/20/2015   Procedure: INTRAMEDULLARY (IM) NAIL LEFT TIBIAL;  Surgeon: Samson FredericBrian Swinteck, MD;  Location: [facility];  Service: Orthopedics;  Laterality: Left;       Home Medications    Prior to Admission medications   Medication Sig Start Date End Date Taking? Authorizing Provider  ARIPiprazole (ABILIFY IM) Inject into the muscle.   Yes [provider]  docusate sodium (COLACE) 100 MG capsule Take 1 capsule (100 mg total) by mouth every 12 (twelve) hours. 1-2 capsules daily for regular bowel movements 10/15/20  Yes Rhys MartiniGraham, Jimena Wieczorek E, PA-C  hydrocortisone (ANUSOL-HC) 2.5 % rectal cream Place 1 application rectally 2 (two) times daily. While symptoms persist 10/15/20  Yes Rhys MartiniGraham, Jaciel Diem E, PA-C  benztropine (COGENTIN)  1 MG tablet Take 1 tablet (1 mg total) by mouth 2 (two) times daily. Patient not taking: Reported on 02/03/2020 05/04/19   Malvin Johns, MD  doxycycline (VIBRAMYCIN) 100 MG capsule Take 1 capsule (100 mg total) by mouth 2 (two) times daily. Patient not taking: Reported on 10/15/2020 06/26/20   Pollyann Savoy, MD  gabapentin (NEURONTIN) 300 MG capsule Take 1 capsule (300 mg total) by mouth 3 (three) times daily. Patient not taking: Reported on 02/03/2020 05/04/19   Malvin Johns, MD   haloperidol (HALDOL) 10 MG tablet Take 10-20 mg by mouth 2 (two) times daily. Take one 10 mg tablet by mouth once daily and two tablets (20 mg) at bedtime Patient not taking: Reported on 10/15/2020    [provider]  haloperidol decanoate (HALDOL DECANOATE) 50 MG/ML injection Inject 4 mLs (200 mg total) into the muscle every 28 (twenty-eight) days. Due 05/29/2019 Patient not taking: Reported on 02/03/2020 05/04/19   Malvin Johns, MD  ibuprofen (ADVIL) 600 MG tablet Take 1 tablet (600 mg total) by mouth every 6 (six) hours as needed. 08/05/20   Mickie Bail, NP  lithium carbonate (ESKALITH) 450 MG CR tablet Take 1 tablet (450 mg total) by mouth every 12 (twelve) hours. Patient not taking: Reported on 02/03/2020 05/04/19   Malvin Johns, MD  ondansetron (ZOFRAN-ODT) 4 MG disintegrating tablet Take 1 tablet (4 mg total) by mouth every 8 (eight) hours as needed for nausea or vomiting. 05/19/20   Georgetta Haber, NP  topiramate (TOPAMAX) 50 MG tablet Take 1 tablet (50 mg total) by mouth 2 (two) times daily. Patient not taking: Reported on 02/03/2020 05/04/19   Malvin Johns, MD  traZODone (DESYREL) 150 MG tablet Take 1 tablet (150 mg total) by mouth at bedtime. Patient not taking: Reported on 02/03/2020 03/09/19   Malvin Johns, MD    Family History Family History  Problem Relation Age of Onset  . Healthy Mother   . Healthy Father     Social History Social History   Tobacco Use  . Smoking status: Current Every Day Smoker    Packs/day: 2.00    Types: Cigarettes  . Smokeless tobacco: Never Used  . Tobacco comment: 1-3 packs   Vaping Use  . Vaping Use: Former  Substance Use Topics  . Alcohol use: Not Currently    Alcohol/week: 2.0 standard drinks    Types: 2 Shots of liquor per week  . Drug use: Not Currently    Types: Cocaine, Marijuana, Benzodiazepines, Heroin    Comment: heroin     Allergies   Clozapine, Prolixin [fluphenazine], and Risperdal [risperidone]   Review of  Systems Review of Systems  Gastrointestinal: Positive for blood in stool and rectal pain. Negative for abdominal distention, abdominal pain, anal bleeding, constipation, diarrhea, nausea and vomiting.  All other systems reviewed and are negative.    Physical Exam Triage Vital Signs ED Triage Vitals  Enc Vitals Group     BP 10/15/20 1531 114/68     Pulse Rate 10/15/20 1531 63     Resp 10/15/20 1531 18     Temp 10/15/20 1531 98.8 F (37.1 C)     Temp Source 10/15/20 1531 Oral     SpO2 10/15/20 1531 97 %     Weight --      Height --      Head Circumference --      Peak Flow --      Pain Score 10/15/20 1528 7  Pain Loc --      Pain Edu? --      Excl. in GC? --    No data found.  Updated Vital Signs BP 114/68 (BP Location: Right Arm)   Pulse 63   Temp 98.8 F (37.1 C) (Oral)   Resp 18   SpO2 97%   Visual Acuity Right Eye Distance:   Left Eye Distance:   Bilateral Distance:    Right Eye Near:   Left Eye Near:    Bilateral Near:     Physical Exam Vitals reviewed.  Constitutional:      General: He is not in acute distress.    Appearance: Normal appearance. He is not ill-appearing.  HENT:     Head: Normocephalic and atraumatic.     Mouth/Throat:     Mouth: Mucous membranes are moist.     Comments: Moist mucous membranes Eyes:     Extraocular Movements: Extraocular movements intact.     Pupils: Pupils are equal, round, and reactive to light.  Cardiovascular:     Rate and Rhythm: Normal rate and regular rhythm.     Heart sounds: Normal heart sounds.  Pulmonary:     Effort: Pulmonary effort is normal.     Breath sounds: Normal breath sounds. No wheezing, rhonchi or rales.  Abdominal:     General: Bowel sounds are normal. There is no distension.     Palpations: Abdomen is soft. There is no mass.     Tenderness: There is no abdominal tenderness. There is no right CVA tenderness, left CVA tenderness, guarding or rebound.     Comments: BS full throughout. No  abd pain to palpation.  Genitourinary:    Comments: Rectum with one nonthrombosed external hemorrhoid at 6 o'clock. Nontender.  Skin:    General: Skin is warm.     Capillary Refill: Capillary refill takes less than 2 seconds.     Comments: Good skin turgor  Neurological:     General: No focal deficit present.     Mental Status: He is alert and oriented to person, place, and time.     Comments: CN 2-12 grossly intact  Psychiatric:        Mood and Affect: Mood normal.        Behavior: Behavior normal.      UC Treatments / Results  Labs (all labs ordered are listed, but only abnormal results are displayed) Labs Reviewed - No data to display  EKG   Radiology No results found.  Procedures Procedures (including critical care time)  Medications Ordered in UC Medications - No data to display  Initial Impression / Assessment and Plan / UC Course  I have reviewed the triage vital signs and the nursing notes.  Pertinent labs & imaging results that were available during my care of the patient were reviewed by me and considered in my medical decision making (see chart for details).     This patient is a 25 year old male presenting with rectal bleeding and external hemorrhoid. Vitals are within normal limits and patient appears well-hydrated.  This patient was initially brought to behavioral health urgent care earlier today by law enforcement for his delusional schizophrenia.  While there, he admitted to abdominal pain, dizziness, weakness, rectal pain.  Pulse was 49 earlier today at behavioral health urgent care.  He was transferred to the ED via EMS, and was placed on IV fluids in triage.  However, he stated that this was taking too long and ripped out his IV  and left AGAINST MEDICAL ADVICE. Suspect HR is improved due to IVF. It is possible that symptoms are related to external hemorrhoid, but given symptoms and vitals behavioral health urgent care, I cannot entirely rule out other  causes of this including abdominal and colon/rectal issue.   Rec anusol and colace, but STRONGLY advised this patient to head to ED for further evaluation and management. He states that he will not do this.   Mother/guardian was not present during this visit.   Coding this visit a level 4 as I am recommending this patient head straight to the emergency department for further evaluation and management.  Final Clinical Impressions(s) / UC Diagnoses   Final diagnoses:  External hemorrhoid  Rectal bleeding     Discharge Instructions     -You have an external hemorrhoid.  I suspect that this is causing your rectal bleeding. -Hemorrhoids are treated with a stool softener and Anusol rectal cream.  I sent prescription of both to your pharmacy.  Also make sure drink plenty of fluids and eat lots of vegetables to help pass regular stool. -At the behavioral health urgent care, you also endorsed dizziness, abdominal pain, weakness, and your heart rate was low.  They recommended that you be evaluated in the emergency department.  I cannot rule out abdominal or colon issue that is also contributing to your bleeding, and I do recommend that you head to the emergency department for the evaluation.  If you do not do this, your condition may go untreated, and this can lead to health consequences. -Call 911 if severe abdominal pain, dizziness, weakness, suicidal ideation, homicidal ideation.    ED Prescriptions    Medication Sig Dispense Auth. Provider   docusate sodium (COLACE) 100 MG capsule Take 1 capsule (100 mg total) by mouth every 12 (twelve) hours. 1-2 capsules daily for regular bowel movements 60 capsule Rhys Martini, PA-C   hydrocortisone (ANUSOL-HC) 2.5 % rectal cream Place 1 application rectally 2 (two) times daily. While symptoms persist 30 g Rhys Martini, PA-C     PDMP not reviewed this encounter.   Rhys Martini, PA-C 10/15/20 [id number]

## 2020-10-15 NOTE — ED Triage Notes (Signed)
Pt sent from [facility]. They report that patient is delusional. They state that he said he was having bright red bleeding from his rectum and want him medically cleared.

## 2020-10-15 NOTE — ED Provider Notes (Addendum)
Behavioral Health Urgent Care Medical Screening Exam  Patient Name: [name] MRN: [id number] Date of Evaluation: 10/15/20 Chief Complaint:   Diagnosis:  Final diagnoses:  Schizoaffective disorder, bipolar type (HCC)    History of Present illness: [name] is a 25 y.o. male. Patient presented voluntarily to [facility] via Patent examiner. Patient presented with chief complaint of "I don't feel well." patient report that he went to [facility] for rectal bleeding; he reported that he left prior to treatment "because they took too long." He denies any current psychiatric concerns including SI, HI, AVH, and paranoia. He admits to a history of self harming via cutting his wrist. He reports that he last cut "a few years ago." He also report history of prior suicidal attempt and multiple psychiatric hospitalization. He denies alcohol and illicit drug use. Patient consent to collateral information from his mother who is also patient's legal guardian.   Patient assessed upon arrival to [facility], patient is complaining of dizziness,weakness,  bloody stools, rectal, and abdominal pain. Patient denies chest pain, SOB, N/V, or syncope    Collateral information  Patient's mother [name] [phone] confirms that she's patient's legal guardian. Patient's mother voiced that she is only authorizing patient to get prozac 20mg /day and haldol. She shares that she doesn't want patient to get any other medication while patient is here at [facility]. This [facility] informed patient's mother that at times it may be necessary to administer other medication due to patient's condition. Patient's mother then stated that she would like to be notified prior to any new medication administration.   patient's mother reports that patient has been avoiding his ACT team and refusing his Abilify injection. She report that patient is manipulative and refusing to take medication due  to fear of gaining weight. She also report that patient has "delusion that he is a model and must stay at 139 pounds or below." she report that he at times engages in extreme dieting to achieve this goal. She report that patient may be constipated which may explain his hemorrhoids and rectal bleeding.   This Clinical research associate discussed transferring patient to ED for medical clearance due to patient's report of rectal bleeding, dizziness, weakness, abdominal and rectal pain complaint. Patient's mother is in agreement and stated she will IVC patient if needed patient is unwilling to go to the emergency room for treatment and evaluation.    Psychiatric Specialty Exam  Presentation  General Appearance:Appropriate for Environment  Eye Contact:Good  Speech:Normal Rate  Speech Volume:Decreased  Handedness:Right   Mood and Affect  Mood:Depressed  Affect:Congruent   Thought Process  Thought Processes:Coherent  Descriptions of Associations:Intact  Orientation:Full (Time, Place and Person)  Thought Content:WDL    Hallucinations:No data recorded Ideas of Reference:No data recorded Suicidal Thoughts:No data recorded Homicidal Thoughts:No data recorded  Sensorium  Memory:No data recorded Judgment:No data recorded Insight:No data recorded  Executive Functions  Concentration:No data recorded Attention Span:No data recorded Recall:No data recorded Fund of Knowledge:No data recorded Language:No data recorded  Psychomotor Activity  Psychomotor Activity:No data recorded  Assets  Assets:No data recorded  Sleep  Sleep:No data recorded Number of hours: No data recorded  No data recorded  Physical Exam: Physical Exam HENT:     Head: Normocephalic.     Nose: Nose normal.  Eyes:     General:        Right eye: No discharge.        Left eye: No discharge.  Cardiovascular:  Rate and Rhythm: Normal rate.  Pulmonary:     Effort: Pulmonary effort is normal. No respiratory distress.   Abdominal:     Tenderness: There is abdominal tenderness.     Comments: C/o of rectal pain/bleeding   Musculoskeletal:        General: Normal range of motion.     Cervical back: Normal range of motion. No rigidity.  Lymphadenopathy:     Cervical: No cervical adenopathy.  Skin:    General: Skin is warm.  Neurological:     Mental Status: He is alert and oriented to person, place, and time.  Psychiatric:        Attention and Perception: He does not perceive auditory or visual hallucinations.        Mood and Affect: Mood normal.        Speech: Speech normal.        Behavior: Behavior normal. Behavior is not agitated or aggressive. Behavior is cooperative.        Thought Content: Thought content normal. Thought content is not paranoid or delusional. Thought content does not include homicidal or suicidal ideation. Thought content does not include homicidal or suicidal plan.        Cognition and Memory: Cognition normal.    Review of Systems  Constitutional: Negative for chills and fever.  HENT: Negative.   Eyes: Negative.   Respiratory: Negative.  Negative for cough and hemoptysis.   Cardiovascular: Negative.  Negative for chest pain and palpitations.  Gastrointestinal:       Abdominal pain 3 out of  10   Genitourinary: Negative.   Musculoskeletal: Negative.   Skin: Negative.   Neurological: Positive for dizziness and weakness.  Psychiatric/Behavioral: Negative.    Blood pressure 111/71, pulse (!) 49, temperature 98.7 F (37.1 C), resp. rate 16, SpO2 100 %. There is no height or weight on file to calculate BMI.  Musculoskeletal: Strength & Muscle Tone: within normal limits Gait & Station: normal Patient leans: Right   [facility] MSE Discharge Disposition for Follow up and Recommendations: Based on my evaluation the patient appears to have an emergency medical condition for which I recommend the patient be transferred to the emergency department for further evaluation.   Patient  c/o lightheadedness/dizziness, weakness, abdominal pain, rectal bleeding and pain, pt's HR is 49 bpm patient will be transfered to ED for medical clearance. Patient's mother/legal guardian is aware of plan.    Maricela Bo, NP 10/15/2020, 6:52 AM

## 2020-10-15 NOTE — BH Assessment (Signed)
Patient reports to [facility] via [employer] with complaints he is not feeling well. During interview patient denies SI/ HI/AVH during triage but endorses having SI a few weeks ago . Patient has a history of self harming behaviors last cutting episode was a few years ago. Patient reports one prior suicide attempt in 2016, Patient has had multiple hospitalizations ([facility], [facility]), patient last admission was 2021. Patient reports that he has an Investment banker, operational but tries to avoid dealing with them because he believes they cant help him.  Patient reports he received an Abilify injection at the beginning of the month. Patient report his mother Wilson Singer [phone]) is his legal guardian , but he lives with his aunt. Patient reports medical concerns of rectal bleeding(bright red in color), abdominal pain , vomiting and provider feels he needs medical clearance before being treated at [facility] . Patient will be transferred to [facility].    Writer and provider spoke with patient's guardian and she advised that she does not give authorization for any other medication except prozac 20 mg and haldol. Patient's mother stated patient is manipulative and will lie about how he truly feels and wont take any medications that will make him gain weight. Mother stated he wants to be a model and needs to remain at 139 pd exactly . Mother reports that her son will do extreme diet and fasting to obtain his weight. Mother stated she will IVC him in the morning if her refuses to be treated at ED. Mother was advised of the treatment to transfer to ED to address medical concerns before treatment for any mental health concerns.

## 2020-10-15 NOTE — Discharge Instructions (Signed)
-  You have an external hemorrhoid.  I suspect that this is causing your rectal bleeding. -Hemorrhoids are treated with a stool softener and Anusol rectal cream.  I sent prescription of both to your pharmacy.  Also make sure drink plenty of fluids and eat lots of vegetables to help pass regular stool. -At the behavioral health urgent care, you also endorsed dizziness, abdominal pain, weakness, and your heart rate was low.  They recommended that you be evaluated in the emergency department.  I cannot rule out abdominal or colon issue that is also contributing to your bleeding, and I do recommend that you head to the emergency department for the evaluation.  If you do not do this, your condition may go untreated, and this can lead to health consequences. -Call 911 if severe abdominal pain, dizziness, weakness, suicidal ideation, homicidal ideation.

## 2020-10-15 NOTE — ED Triage Notes (Signed)
Patient reports bright red blood when going to the bathroom.  Patient reports having last bm this morning and hard stool.  History of constipation.  Patient went to the ed on 10/15/2020, but did not stay to be seen.

## 2020-10-15 NOTE — ED Notes (Addendum)
Report called to Clent Ridges at [facility] ED at [phone]. Patient presented to [facility] for psychiatric evaluation for reported delusional behavior.  Report of rectal bleeding intermittently over the course of a week.  Patient is alert and oriented with vital signs stable.  PTAR non-emergent request.  EMTALA and transport in process.

## 2020-10-15 NOTE — ED Notes (Signed)
Spoke with patient's mother and legal guardian Victory Dakin. She was made aware of pt arrival. Also made aware that patient refused to stay to be seen. He denies SI/HI. I made Dr Blinda Leatherwood aware of patient and he immediately came up to see him, but patient had started walking out of the ED. I asked the patient to stay, he refused. Dr Blinda Leatherwood and I walked around to the EMS bay and approached the patient walking on the sidewalk to encourage him to stay for evaluation. He continued to decline. He removed his IV and threw it. I called his mother back and made her aware. I let her know that we cannot force him to stay at this point and that she could speak to the magistrate if she wants to IVC him. She verbalized understanding.

## 2020-10-31 ENCOUNTER — Encounter (HOSPITAL_COMMUNITY): Payer: Self-pay | Admitting: Emergency Medicine

## 2020-10-31 ENCOUNTER — Ambulatory Visit (HOSPITAL_COMMUNITY)
Admission: EM | Admit: 2020-10-31 | Discharge: 2020-10-31 | Disposition: A | Discharge status: Home / Self Care | Payer: BC Managed Care – PPO

## 2020-10-31 ENCOUNTER — Other Ambulatory Visit: Payer: Self-pay

## 2020-10-31 DIAGNOSIS — L84 Corns and callosities: Secondary | ICD-10-CM | POA: Diagnosis not present

## 2020-10-31 NOTE — Discharge Instructions (Addendum)
Call podiatry for appointment  [facility] [address] [place], [place], [place] [address] [phone]  Soak foot in warm water  Buy callus remover pad at the pharmacy, such as Dr. Evel[facility] Dun [facility], use as directed

## 2020-10-31 NOTE — ED Triage Notes (Signed)
Patient has a callus on left great toe is painful.  Skin is disrupted to callus, patient denies putting anything on this area or picking at this area

## 2020-10-31 NOTE — ED Provider Notes (Signed)
[facility]    CSN: [id number] Arrival date & time: 10/31/20  0944      History   Chief Complaint Chief Complaint  Patient presents with   Foot Pain    HPI [name] is a 25 y.o. male.   Patient here c/w "foot infection"  2 months.  States he has had a callous on foot x 1 year, now having pain.  Denies erythema, swelling, discharge, arthralgia.  Pain only with palpation. He has not tried anything for this.   Past Medical History:  Diagnosis Date   ADD (attention deficit disorder)    ADHD (attention deficit hyperactivity disorder), 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   Anemia    Anxiety    Bipolar disorder (HCC)    Borderline personality disorder (HCC)    Central auditory processing disorder    Deliberate self-cutting    Depressed    Eczema    Headache(784.0)    Oppositional defiant disorder    Schizophrenia [facility])     Patient Active Problem List   Diagnosis Date Noted   Schizoaffective disorder, bipolar type (HCC) 09/16/2016   Eating disorder Unspecified 05/22/2016   Borderline personality disorder (HCC) 05/16/2016   Tobacco use disorder 05/15/2016    Past Surgical History:  Procedure Laterality Date   BACK SURGERY     FRACTURE SURGERY     NO PAST SURGERIES     POSTERIOR LUMBAR FUSION N/A 02/19/2015   Procedure: LATERAL L-2 CORPECTOMY;  Surgeon: Lisbeth Renshaw, MD;  Location: [facility] OR;  Service: Neurosurgery;  Laterality: N/A;   POSTERIOR LUMBAR FUSION 4 LEVEL  02/19/2015   Procedure: Posterior T-12 - L-4 STABILIZATION OF POSTERIOR LUMBAR;  Surgeon: Lisbeth Renshaw, MD;  Location: [facility] OR;  Service: Neurosurgery;;   TIBIA IM NAIL INSERTION Left 02/20/2015   Procedure: INTRAMEDULLARY (IM) NAIL LEFT TIBIAL;  Surgeon: [name], MD;  Location: [facility] OR;  Service: Orthopedics;  Laterality: Left;       Home Medications    Prior to Admission medications   Medication Sig Start Date End Date Taking? Authorizing Provider  topiramate  (TOPAMAX) 50 MG tablet Take 1 tablet (50 mg total) by mouth 2 (two) times daily. 05/04/19  Yes Malvin Johns, MD  ARIPiprazole (ABILIFY IM) Inject into the muscle. Patient not taking: Reported on 10/31/2020    [provider]  benztropine (COGENTIN) 1 MG tablet Take 1 tablet (1 mg total) by mouth 2 (two) times daily. Patient not taking: Reported on 02/03/2020 05/04/19   Malvin Johns, MD  docusate sodium (COLACE) 100 MG capsule Take 1 capsule (100 mg total) by mouth every 12 (twelve) hours. 1-2 capsules daily for regular bowel movements Patient not taking: Reported on 10/31/2020 10/15/20   Rhys Martini, PA-C  doxycycline (VIBRAMYCIN) 100 MG capsule Take 1 capsule (100 mg total) by mouth 2 (two) times daily. Patient not taking: Reported on 10/15/2020 06/26/20   Pollyann Savoy, MD  gabapentin (NEURONTIN) 300 MG capsule Take 1 capsule (300 mg total) by mouth 3 (three) times daily. Patient not taking: Reported on 02/03/2020 05/04/19   Malvin Johns, MD  haloperidol (HALDOL) 10 MG tablet Take 10-20 mg by mouth 2 (two) times daily. Take one 10 mg tablet by mouth once daily and two tablets (20 mg) at bedtime Patient not taking: Reported on 10/15/2020    [provider]  haloperidol decanoate (HALDOL DECANOATE) 50 MG/ML injection Inject 4 mLs (200 mg total) into the muscle every 28 (twenty-eight) days. Due 05/29/2019 Patient  not taking: Reported on 02/03/2020 05/04/19   Malvin Johns, MD  hydrocortisone (ANUSOL-HC) 2.5 % rectal cream Place 1 application rectally 2 (two) times daily. While symptoms persist 10/15/20   Rhys Martini, PA-C  ibuprofen (ADVIL) 600 MG tablet Take 1 tablet (600 mg total) by mouth every 6 (six) hours as needed. 08/05/20   Mickie Bail, NP  lithium carbonate (ESKALITH) 450 MG CR tablet Take 1 tablet (450 mg total) by mouth every 12 (twelve) hours. Patient not taking: Reported on 02/03/2020 05/04/19   Malvin Johns, MD  ondansetron (ZOFRAN-ODT) 4 MG disintegrating tablet Take  1 tablet (4 mg total) by mouth every 8 (eight) hours as needed for nausea or vomiting. Patient not taking: Reported on 10/31/2020 05/19/20   Linus Mako B, NP  traZODone (DESYREL) 150 MG tablet Take 1 tablet (150 mg total) by mouth at bedtime. Patient not taking: Reported on 02/03/2020 03/09/19   Malvin Johns, MD    Family History Family History  Problem Relation Age of Onset   Healthy Mother    Healthy Father     Social History Social History   Tobacco Use   Smoking status: Every Day    Packs/day: 2.00    Pack years: 0.00    Types: Cigarettes   Smokeless tobacco: Never   Tobacco comments:    1-3 packs   Vaping Use   Vaping Use: Former  Substance Use Topics   Alcohol use: Not Currently    Alcohol/week: 2.0 standard drinks    Types: 2 Shots of liquor per week   Drug use: Not Currently    Types: Cocaine, Marijuana, Benzodiazepines, Heroin    Comment: heroin     Allergies   Clozapine, Prolixin [fluphenazine], and Risperdal [risperidone]   Review of Systems Review of Systems  Constitutional:  Negative for chills, fatigue and fever.  Gastrointestinal:  Negative for nausea and vomiting.  Musculoskeletal:  Positive for myalgias. Negative for arthralgias, gait problem and joint swelling.  Skin:  Negative for color change and wound.  Neurological:  Negative for weakness and numbness.  Hematological:  Negative for adenopathy. Does not bruise/bleed easily.  Psychiatric/Behavioral:  Negative for sleep disturbance.     Physical Exam Triage Vital Signs ED Triage Vitals  Enc Vitals Group     BP 10/31/20 1025 108/73     Pulse Rate 10/31/20 1025 64     Resp 10/31/20 1025 18     Temp 10/31/20 1025 98.2 F (36.8 C)     Temp Source 10/31/20 1025 Oral     SpO2 10/31/20 1025 98 %     Weight --      Height --      Head Circumference --      Peak Flow --      Pain Score 10/31/20 1020 5     Pain Loc --      Pain Edu? --      Excl. in GC? --    No data found.  Updated  Vital Signs BP 108/73 (BP Location: Left Arm)   Pulse 64   Temp 98.2 F (36.8 C) (Oral)   Resp 18   SpO2 98%   Visual Acuity Right Eye Distance:   Left Eye Distance:   Bilateral Distance:    Right Eye Near:   Left Eye Near:    Bilateral Near:     Physical Exam Vitals and nursing note reviewed.  Constitutional:      General: He is not in acute distress.  Appearance: Normal appearance. He is not ill-appearing.  HENT:     Head: Normocephalic and atraumatic.  Eyes:     General: No scleral icterus.    Extraocular Movements: Extraocular movements intact.     Conjunctiva/sclera: Conjunctivae normal.  Pulmonary:     Effort: Pulmonary effort is normal. No respiratory distress.  Musculoskeletal:     Cervical back: Normal range of motion. No rigidity.  Feet:     Left foot:     Skin integrity: Callus (no erythema, no discharge, raised 8 mm, 1.3 cm diameter.) present.  Skin:    Capillary Refill: Capillary refill takes less than 2 seconds.     Coloration: Skin is not jaundiced.     Findings: No rash.  Neurological:     General: No focal deficit present.     Mental Status: He is alert and oriented to person, place, and time.     Motor: No weakness.     Gait: Gait normal.  Psychiatric:        Mood and Affect: Mood normal.        Behavior: Behavior normal.     UC Treatments / Results  Labs (all labs ordered are listed, but only abnormal results are displayed) Labs Reviewed - No data to display  EKG   Radiology No results found.  Procedures Procedures (including critical care time)  Medications Ordered in UC Medications - No data to display  Initial Impression / Assessment and Plan / UC Course  I have reviewed the triage vital signs and the nursing notes.  Pertinent labs & imaging results that were available during my care of the patient were reviewed by me and considered in my medical decision making (see chart for details).     Use callus removing pads.   Follow up with podiatry if no improvement after 1 week No signs of infection noted on exam Final Clinical Impressions(s) / UC Diagnoses   Final diagnoses:  Pre-ulcerative corn or callous     Discharge Instructions      Call podiatry for appointment  [facility] [address] [place], [place], [place] [address] [phone]  Soak foot in warm water  Buy callus remover pad at the pharmacy, such as Dr. Margart Sickles Callus Removers, use as directed   ED Prescriptions   None    PDMP not reviewed this encounter.   Evern Core, PA-C 10/31/20 1046

## 2020-11-08 ENCOUNTER — Other Ambulatory Visit: Payer: Self-pay

## 2020-11-08 ENCOUNTER — Emergency Department (HOSPITAL_COMMUNITY): Payer: BC Managed Care – PPO

## 2020-11-08 ENCOUNTER — Emergency Department (HOSPITAL_COMMUNITY)
Admission: EM | Admit: 2020-11-08 | Discharge: 2020-11-08 | Discharge status: Against Medical Advice | Payer: BC Managed Care – PPO | Attending: Emergency Medicine | Admitting: Emergency Medicine

## 2020-11-08 DIAGNOSIS — R0602 Shortness of breath: Secondary | ICD-10-CM | POA: Diagnosis not present

## 2020-11-08 DIAGNOSIS — F17210 Nicotine dependence, cigarettes, uncomplicated: Secondary | ICD-10-CM | POA: Diagnosis not present

## 2020-11-08 DIAGNOSIS — R0789 Other chest pain: Secondary | ICD-10-CM | POA: Insufficient documentation

## 2020-11-08 DIAGNOSIS — R079 Chest pain, unspecified: Secondary | ICD-10-CM | POA: Diagnosis present

## 2020-11-08 LAB — TROPONIN I (HIGH SENSITIVITY): Troponin I (High Sensitivity): <2 ng/L (ref <18)

## 2020-11-08 LAB — CBC WITH DIFFERENTIAL/PLATELET
Abs Immature Granulocytes: 0.01 10*3/uL (ref 0.00–0.07)
Basophils Absolute: 0 10*3/uL (ref 0.0–0.1)
Basophils Relative: 0 %
Eosinophils Absolute: 0.1 10*3/uL (ref 0.0–0.5)
Eosinophils Relative: 2 %
HCT: 46.7 % (ref 39.0–52.0)
Hemoglobin: 14.9 g/dL (ref 13.0–17.0)
Immature Granulocytes: 0 %
Lymphocytes Relative: 41 %
Lymphs Abs: 2.2 10*3/uL (ref 0.7–4.0)
MCH: 27.4 pg (ref 26.0–34.0)
MCHC: 31.9 g/dL (ref 30.0–36.0)
MCV: 86 fL (ref 80.0–100.0)
Monocytes Absolute: 0.4 10*3/uL (ref 0.1–1.0)
Monocytes Relative: 7 %
Neutro Abs: 2.7 10*3/uL (ref 1.7–7.7)
Neutrophils Relative %: 50 %
Platelets: 219 10*3/uL (ref 150–400)
RBC: 5.43 MIL/uL (ref 4.22–5.81)
RDW: 13.9 % (ref 11.5–15.5)
WBC: 5.4 10*3/uL (ref 4.0–10.5)
nRBC: 0 % (ref 0.0–0.2)

## 2020-11-08 LAB — BASIC METABOLIC PANEL
Anion gap: 7 (ref 5–15)
BUN: 9 mg/dL (ref 6–20)
CO2: 21 mmol/L — ABNORMAL LOW (ref 22–32)
Calcium: 9.3 mg/dL (ref 8.9–10.3)
Chloride: 109 mmol/L (ref 98–111)
Creatinine, Ser: 0.87 mg/dL (ref 0.61–1.24)
GFR, Estimated: >60 mL/min (ref >60)
Glucose, Bld: 88 mg/dL (ref 70–99)
Potassium: 3.7 mmol/L (ref 3.5–5.1)
Sodium: 137 mmol/L (ref 135–145)

## 2020-11-08 NOTE — ED Triage Notes (Signed)
Pt c/o left sided C/P that radiates down left side since yesterday. Pt denies pain currently. Pt states pain is 10/10 when it does occur.

## 2020-11-08 NOTE — ED Notes (Signed)
Patient verbalizes understanding of discharge instructions. Opportunity for questioning and answers were provided. Armband removed by staff, pt discharged from ED and ambulated to lobby to return home.   

## 2020-11-08 NOTE — ED Notes (Signed)
Attempted to call legal guardian r/t to pt wanting to leave hospital. Voicemail left.

## 2020-11-08 NOTE — ED Provider Notes (Signed)
[facility] Provider Note  CSN: [id number] Arrival date & time: 11/08/20 0705    History Chief Complaint  Patient presents with   Chest Pain    [name] is a 25 y.o. male with history of mental health problems, but no significant medical history reports left chest pain off and on since yesterday. Initially lasted a few seconds at a time, but now has lasted several hours overnight. Pain is moderate aching, left sided and going down left side. Associated with SOB but no N/V or diaphoresis. No particular provoking or relieving factors. No prior history of CAD.    Past Medical History:  Diagnosis Date   ADD (attention deficit disorder)    ADHD (attention deficit hyperactivity disorder), 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   Anemia    Anxiety    Bipolar disorder (HCC)    Borderline personality disorder (HCC)    Central auditory processing disorder    Deliberate self-cutting    Depressed    Eczema    Headache(784.0)    Oppositional defiant disorder    Schizophrenia (HCC)     Past Surgical History:  Procedure Laterality Date   BACK SURGERY     FRACTURE SURGERY     NO PAST SURGERIES     POSTERIOR LUMBAR FUSION N/A 02/19/2015   Procedure: LATERAL L-2 CORPECTOMY;  Surgeon: Lisbeth Renshaw, MD;  Location[facility];  Service: Neurosurgery;  Laterality: N/A;   POSTERIOR LUMBAR FUSION 4 LEVEL  02/19/2015   Procedure: Posterior T-12 - L-4 STABILIZATION OF POSTERIOR LUMBAR;  Surgeon: Lisbeth Renshaw, MD;  [facility];  Service: Neurosurgery;;   TIBIA IM NAIL INSERTION Left 02/20/2015   Procedure: INTRAMEDULLARY (IM) NAIL LEFT TIBIAL;  Surgeon: [name], MD;  Location: [facility];  Service: Orthopedics;  Laterality: Left;    Family History  Problem Relation Age of Onset   Healthy Mother    Healthy Father     Social History   Tobacco Use   Smoking status: Every Day    Packs/day: 2.00    Pack years: 0.00    Types: Cigarettes    Smokeless tobacco: Never   Tobacco comments:    1-3 packs   Vaping Use   Vaping Use: Former  Substance Use Topics   Alcohol use: Not Currently    Alcohol/week: 2.0 standard drinks    Types: 2 Shots of liquor per week   Drug use: Not Currently    Types: Cocaine, Marijuana, Benzodiazepines, Heroin    Comment: heroin     Home Medications Prior to Admission medications   Medication Sig Start Date End Date Taking? Authorizing Provider  topiramate (TOPAMAX) 50 MG tablet Take 50 mg by mouth in the morning, at noon, and at bedtime.   Yes [provider]  benztropine (COGENTIN) 1 MG tablet Take 1 tablet (1 mg total) by mouth 2 (two) times daily. 05/04/19   Malvin Johns, MD  haloperidol decanoate (HALDOL DECANOATE) 50 MG/ML injection Inject 4 mLs (200 mg total) into the muscle every 28 (twenty-eight) days. Due 05/29/2019 05/04/19   Malvin Johns, MD  lithium carbonate (ESKALITH) 450 MG CR tablet Take 1 tablet (450 mg total) by mouth every 12 (twelve) hours. 05/04/19   Malvin Johns, MD  traZODone (DESYREL) 150 MG tablet Take 1 tablet (150 mg total) by mouth at bedtime. 03/09/19   Malvin Johns, MD     Allergies    Clozapine, Prolixin [fluphenazine], and Risperdal [risperidone]   Review of Systems  Review of Systems A comprehensive review of systems was completed and negative except as noted in HPI.    Physical Exam BP 110/63   Pulse (!) 46   Temp 98.2 F (36.8 C) (Oral)   Resp 15   SpO2 100%   Physical Exam Vitals and nursing note reviewed.  Constitutional:      Appearance: Normal appearance.  HENT:     Head: Normocephalic and atraumatic.     Nose: Nose normal.     Mouth/Throat:     Mouth: Mucous membranes are moist.  Eyes:     Extraocular Movements: Extraocular movements intact.     Conjunctiva/sclera: Conjunctivae normal.  Cardiovascular:     Rate and Rhythm: Normal rate.  Pulmonary:     Effort: Pulmonary effort is normal.     Breath sounds: Normal breath  sounds.  Chest:     Chest wall: Tenderness (left chest wall) present.  Abdominal:     General: Abdomen is flat.     Palpations: Abdomen is soft.     Tenderness: There is no abdominal tenderness.  Musculoskeletal:        General: No swelling. Normal range of motion.     Cervical back: Neck supple.  Skin:    General: Skin is warm and dry.  Neurological:     General: No focal deficit present.     Mental Status: He is alert.  Psychiatric:        Mood and Affect: Mood normal.     ED Results / Procedures / Treatments   Labs (all labs ordered are listed, but only abnormal results are displayed) Labs Reviewed  BASIC METABOLIC PANEL - Abnormal; Notable for the following components:      Result Value   CO2 21 (*)    All other components within normal limits  CBC WITH DIFFERENTIAL/PLATELET  TROPONIN I (HIGH SENSITIVITY)    EKG EKG Interpretation  Date/Time:  Wednesday November 08 2020 07:19:05 EDT Ventricular Rate:  49 PR Interval:  181 QRS Duration: 97 QT Interval:  401 QTC Calculation: 362 R Axis:   73 Text Interpretation: Sinus bradycardia RSR' in V1 or V2, probably normal variant ST elev, probable normal early repol pattern No significant change since last tracing Confirmed by Susy Frizzle [phone]) on 11/08/2020 7:21:52 AM  Radiology DG Chest 2 View  Result Date: 11/08/2020 CLINICAL DATA:  25 year old male with chest pain shortness of breath and left arm pain since yesterday. EXAM: CHEST - 2 VIEW COMPARISON:  Portable chest 12/13/2018 and earlier. FINDINGS: Partially visible prior posterior and interbody thoracolumbar junction and lumbar fusion, with associated probable corpectomy. Visible hardware appears intact. Reversal of the normal thoracic kyphosis. No acute osseous abnormality identified. Normal lung volumes and mediastinal contours. Visualized tracheal air column is within normal limits. Both lungs appear clear. No pneumothorax or pleural effusion. Negative visible bowel  gas. IMPRESSION: 1.  Negative, no cardiopulmonary abnormality. 2. Prior thoracolumbar surgery. Electronically Signed   By: Odessa Fleming M.D.   On: 11/08/2020 07:58    Procedures Procedures  Medications Ordered in the ED Medications - No data to display   MDM Rules/Calculators/A&P MDM Patient with nonspecific chest pain. EKG without ischemic changes. Will check labs and CXR.   ED Course  I have reviewed the triage vital signs and the nursing notes.  Pertinent labs & imaging results that were available during my care of the patient were reviewed by me and considered in my medical decision making (see chart for details).  Clinical Course as of 11/08/20 0852  Wed Nov 08, 2020  0804 CXR is clear [CS]  0819 CBC is normal.  [CS]  0850 Patient now states he wants to leave. Feels like his symptoms may be due to anxiety. I discussed with him that his results haven't come back yet and that he could have a potentially life threatening problem. He understands these risks and still wants to leave. He is calm and cooperative. No SI/HI, no reason to hold him against his will. Attempted to contact mother, Legal Guardian, without success.  [CS]    Clinical Course User Index [CS] Pollyann Savoy, MD    Final Clinical Impression(s) / ED Diagnoses Final diagnoses:  Atypical chest pain    Rx / DC Orders ED Discharge Orders     None        Pollyann Savoy, MD 11/08/20 [phone]

## 2020-11-10 ENCOUNTER — Emergency Department (HOSPITAL_COMMUNITY)
Admission: EM | Admit: 2020-11-10 | Discharge: 2020-11-10 | Disposition: A | Discharge status: Home / Self Care | Payer: BC Managed Care – PPO | Attending: Emergency Medicine | Admitting: Emergency Medicine

## 2020-11-10 ENCOUNTER — Emergency Department (HOSPITAL_COMMUNITY): Payer: BC Managed Care – PPO

## 2020-11-10 ENCOUNTER — Other Ambulatory Visit: Payer: Self-pay

## 2020-11-10 ENCOUNTER — Encounter (HOSPITAL_COMMUNITY): Payer: Self-pay | Admitting: Emergency Medicine

## 2020-11-10 ENCOUNTER — Emergency Department (HOSPITAL_COMMUNITY)
Admission: EM | Admit: 2020-11-10 | Discharge: 2020-11-10 | Disposition: A | Discharge status: Home / Self Care | Payer: BC Managed Care – PPO | Source: Home / Self Care

## 2020-11-10 DIAGNOSIS — Z5321 Procedure and treatment not carried out due to patient leaving prior to being seen by health care provider: Secondary | ICD-10-CM | POA: Diagnosis not present

## 2020-11-10 DIAGNOSIS — R002 Palpitations: Secondary | ICD-10-CM | POA: Insufficient documentation

## 2020-11-10 DIAGNOSIS — R42 Dizziness and giddiness: Secondary | ICD-10-CM | POA: Insufficient documentation

## 2020-11-10 DIAGNOSIS — R079 Chest pain, unspecified: Secondary | ICD-10-CM | POA: Insufficient documentation

## 2020-11-10 DIAGNOSIS — F1290 Cannabis use, unspecified, uncomplicated: Secondary | ICD-10-CM | POA: Insufficient documentation

## 2020-11-10 DIAGNOSIS — F410 Panic disorder [episodic paroxysmal anxiety] without agoraphobia: Secondary | ICD-10-CM | POA: Diagnosis not present

## 2020-11-10 DIAGNOSIS — R0602 Shortness of breath: Secondary | ICD-10-CM | POA: Insufficient documentation

## 2020-11-10 LAB — BASIC METABOLIC PANEL
Anion gap: 10 (ref 5–15)
BUN: 10 mg/dL (ref 6–20)
CO2: 21 mmol/L — ABNORMAL LOW (ref 22–32)
Calcium: 9.4 mg/dL (ref 8.9–10.3)
Chloride: 107 mmol/L (ref 98–111)
Creatinine, Ser: 0.74 mg/dL (ref 0.61–1.24)
GFR, Estimated: >60 mL/min (ref >60)
Glucose, Bld: 98 mg/dL (ref 70–99)
Potassium: 3.4 mmol/L — ABNORMAL LOW (ref 3.5–5.1)
Sodium: 138 mmol/L (ref 135–145)

## 2020-11-10 LAB — RAPID URINE DRUG SCREEN, HOSP PERFORMED
Amphetamines: NOT DETECTED
Barbiturates: NOT DETECTED
Benzodiazepines: NOT DETECTED
Cocaine: NOT DETECTED
Opiates: NOT DETECTED
Tetrahydrocannabinol: NOT DETECTED

## 2020-11-10 LAB — CBC
HCT: 44.4 % (ref 39.0–52.0)
Hemoglobin: 14.6 g/dL (ref 13.0–17.0)
MCH: 27.6 pg (ref 26.0–34.0)
MCHC: 32.9 g/dL (ref 30.0–36.0)
MCV: 83.9 fL (ref 80.0–100.0)
Platelets: 229 10*3/uL (ref 150–400)
RBC: 5.29 MIL/uL (ref 4.22–5.81)
RDW: 13.6 % (ref 11.5–15.5)
WBC: 6.7 10*3/uL (ref 4.0–10.5)
nRBC: 0 % (ref 0.0–0.2)

## 2020-11-10 LAB — CBG MONITORING, ED: Glucose-Capillary: 270 mg/dL — ABNORMAL HIGH (ref 70–99)

## 2020-11-10 LAB — TROPONIN I (HIGH SENSITIVITY): Troponin I (High Sensitivity): 2 ng/L (ref <18)

## 2020-11-10 NOTE — ED Triage Notes (Signed)
Pt here by EMS, EMS called out twice- once for panic attack, once for palpitations. Pt refused transport first time, EKG unremarkable. Pt states no hx smoking marijuana, but marijuana on board (hx of THC in chart), admits to smoking some today. Pt behaves erratically when asked questions, but cooperative in triage. Poor historian due to inebriation.

## 2020-11-10 NOTE — ED Notes (Signed)
Called for vitals x4. Not seen in lobby for some time.

## 2020-11-10 NOTE — ED Notes (Signed)
Patient no longer on hospital grounds 

## 2020-11-10 NOTE — ED Triage Notes (Addendum)
Pt reports palpitations, lightheadedness, sob and left sided chest pain that radiates down his left arm intermittently for 2 days. Pt reports smoking marijuana tonight and thinks it made his sx worse.

## 2020-11-11 ENCOUNTER — Emergency Department (HOSPITAL_COMMUNITY)
Admission: EM | Admit: 2020-11-11 | Discharge: 2020-11-11 | Discharge status: Against Medical Advice | Payer: BC Managed Care – PPO | Source: Home / Self Care | Attending: Emergency Medicine | Admitting: Emergency Medicine

## 2020-11-11 ENCOUNTER — Emergency Department (HOSPITAL_COMMUNITY)
Admission: EM | Admit: 2020-11-11 | Discharge: 2020-11-11 | Disposition: A | Discharge status: Home / Self Care | Payer: BC Managed Care – PPO | Source: Home / Self Care

## 2020-11-11 ENCOUNTER — Encounter (HOSPITAL_COMMUNITY): Payer: Self-pay | Admitting: Emergency Medicine

## 2020-11-11 ENCOUNTER — Emergency Department (HOSPITAL_COMMUNITY)
Admission: EM | Admit: 2020-11-11 | Discharge: 2020-11-11 | Disposition: A | Discharge status: Home / Self Care | Payer: BC Managed Care – PPO | Attending: Emergency Medicine | Admitting: Emergency Medicine

## 2020-11-11 ENCOUNTER — Emergency Department (HOSPITAL_COMMUNITY): Payer: BC Managed Care – PPO

## 2020-11-11 ENCOUNTER — Encounter (HOSPITAL_COMMUNITY): Payer: Self-pay | Admitting: *Deleted

## 2020-11-11 ENCOUNTER — Other Ambulatory Visit: Payer: Self-pay

## 2020-11-11 ENCOUNTER — Emergency Department (HOSPITAL_COMMUNITY)
Admission: EM | Admit: 2020-11-11 | Discharge: 2020-11-12 | Disposition: A | Discharge status: Home / Self Care | Payer: BC Managed Care – PPO | Source: Home / Self Care

## 2020-11-11 DIAGNOSIS — F410 Panic disorder [episodic paroxysmal anxiety] without agoraphobia: Secondary | ICD-10-CM | POA: Insufficient documentation

## 2020-11-11 DIAGNOSIS — Z79899 Other long term (current) drug therapy: Secondary | ICD-10-CM | POA: Insufficient documentation

## 2020-11-11 DIAGNOSIS — R202 Paresthesia of skin: Secondary | ICD-10-CM | POA: Diagnosis not present

## 2020-11-11 DIAGNOSIS — F17210 Nicotine dependence, cigarettes, uncomplicated: Secondary | ICD-10-CM | POA: Insufficient documentation

## 2020-11-11 DIAGNOSIS — M545 Low back pain, unspecified: Secondary | ICD-10-CM | POA: Insufficient documentation

## 2020-11-11 DIAGNOSIS — R519 Headache, unspecified: Secondary | ICD-10-CM | POA: Insufficient documentation

## 2020-11-11 DIAGNOSIS — R002 Palpitations: Secondary | ICD-10-CM | POA: Insufficient documentation

## 2020-11-11 DIAGNOSIS — R079 Chest pain, unspecified: Secondary | ICD-10-CM | POA: Insufficient documentation

## 2020-11-11 DIAGNOSIS — R410 Disorientation, unspecified: Secondary | ICD-10-CM | POA: Insufficient documentation

## 2020-11-11 DIAGNOSIS — Z5321 Procedure and treatment not carried out due to patient leaving prior to being seen by health care provider: Secondary | ICD-10-CM | POA: Insufficient documentation

## 2020-11-11 DIAGNOSIS — R0602 Shortness of breath: Secondary | ICD-10-CM | POA: Diagnosis not present

## 2020-11-11 DIAGNOSIS — F419 Anxiety disorder, unspecified: Secondary | ICD-10-CM | POA: Insufficient documentation

## 2020-11-11 DIAGNOSIS — M79602 Pain in left arm: Secondary | ICD-10-CM | POA: Insufficient documentation

## 2020-11-11 DIAGNOSIS — R200 Anesthesia of skin: Secondary | ICD-10-CM | POA: Insufficient documentation

## 2020-11-11 DIAGNOSIS — R4781 Slurred speech: Secondary | ICD-10-CM | POA: Insufficient documentation

## 2020-11-11 DIAGNOSIS — R0789 Other chest pain: Secondary | ICD-10-CM | POA: Insufficient documentation

## 2020-11-11 DIAGNOSIS — Y900 Blood alcohol level of less than 20 mg/100 ml: Secondary | ICD-10-CM | POA: Diagnosis not present

## 2020-11-11 LAB — CBC WITH DIFFERENTIAL/PLATELET
Abs Immature Granulocytes: 0 10*3/uL (ref 0.00–0.07)
Basophils Absolute: 0 10*3/uL (ref 0.0–0.1)
Basophils Relative: 0 %
Eosinophils Absolute: 0.1 10*3/uL (ref 0.0–0.5)
Eosinophils Relative: 2 %
HCT: 45.5 % (ref 39.0–52.0)
Hemoglobin: 14.9 g/dL (ref 13.0–17.0)
Immature Granulocytes: 0 %
Lymphocytes Relative: 51 %
Lymphs Abs: 2.7 10*3/uL (ref 0.7–4.0)
MCH: 27.6 pg (ref 26.0–34.0)
MCHC: 32.7 g/dL (ref 30.0–36.0)
MCV: 84.4 fL (ref 80.0–100.0)
Monocytes Absolute: 0.3 10*3/uL (ref 0.1–1.0)
Monocytes Relative: 6 %
Neutro Abs: 2.2 10*3/uL (ref 1.7–7.7)
Neutrophils Relative %: 41 %
Platelets: 241 10*3/uL (ref 150–400)
RBC: 5.39 MIL/uL (ref 4.22–5.81)
RDW: 13.6 % (ref 11.5–15.5)
WBC: 5.4 10*3/uL (ref 4.0–10.5)
nRBC: 0 % (ref 0.0–0.2)

## 2020-11-11 LAB — BASIC METABOLIC PANEL
Anion gap: 8 (ref 5–15)
BUN: 11 mg/dL (ref 6–20)
CO2: 24 mmol/L (ref 22–32)
Calcium: 9.4 mg/dL (ref 8.9–10.3)
Chloride: 104 mmol/L (ref 98–111)
Creatinine, Ser: 0.79 mg/dL (ref 0.61–1.24)
GFR, Estimated: >60 mL/min (ref >60)
Glucose, Bld: 91 mg/dL (ref 70–99)
Potassium: 3.5 mmol/L (ref 3.5–5.1)
Sodium: 136 mmol/L (ref 135–145)

## 2020-11-11 LAB — TROPONIN I (HIGH SENSITIVITY)
Troponin I (High Sensitivity): 2 ng/L (ref <18)
Troponin I (High Sensitivity): 3 ng/L (ref <18)

## 2020-11-11 LAB — RAPID URINE DRUG SCREEN, HOSP PERFORMED
Amphetamines: NOT DETECTED
Barbiturates: NOT DETECTED
Benzodiazepines: NOT DETECTED
Cocaine: NOT DETECTED
Opiates: NOT DETECTED
Tetrahydrocannabinol: NOT DETECTED

## 2020-11-11 LAB — ETHANOL
Alcohol, Ethyl (B): <10 mg/dL (ref <10)
Alcohol, Ethyl (B): <10 mg/dL (ref <10)

## 2020-11-11 MED ORDER — LORAZEPAM 1 MG PO TABS
1.0000 mg | ORAL_TABLET | Freq: Once | ORAL | Status: AC
  Administered 2020-11-11: 1 mg via ORAL
  Filled 2020-11-11: qty 1

## 2020-11-11 NOTE — ED Notes (Signed)
PA at bedside.

## 2020-11-11 NOTE — ED Provider Notes (Signed)
Emergency Medicine Provider Triage Evaluation Note  [name] , a 25 y.o. male  was evaluated in triage.  Pt complains of left-sided chest pain, pain, confusion, increased anxiety, and headaches.  Patient has been having the symptoms intermittently for the last 4 days.  Patient states that this is related to the Topamax medication started.  Does complain of left-sided chest pain that radiates to his left arm.  No associated shortness of breath, nausea, vomiting, or diaphoresis.  Patient denies any suicidal ideations, homicidal ideations, auditory examinations, or visual hallucinations.  Patient has been seen multiple times over the last few days for similar symptoms.  Review of Systems  Positive: eft-sided chest pain, pain, confusion, increased anxiety, and headaches Negative: Loss of consciousness, leg swelling,   Physical Exam  BP 110/63 (BP Location: Left Arm)   Pulse 61   Temp 99.1 F (37.3 C) (Oral)   Resp 16   SpO2 94%  Gen:   Awake, no distress   Resp:  Normal effort  MSK:   Moves extremities without difficulty  Other:  +5 strength to his bilateral upper and lower extremities, no facial asymmetry or slurred speech.  Sensation to light touch intact bilateral upper and lower extremities.  +2 radial pulse bilaterally  Medical Decision Making  Medically screening exam initiated at 2:25 PM.  Appropriate orders placed.  [name] was informed that the remainder of the evaluation will be completed by another provider, this initial triage assessment does not replace that evaluation, and the importance of remaining in the ED until their evaluation is complete.  The patient appears stable so that the remainder of the work up may be completed by another provider.      Haskel Schroeder, PA-C 11/11/20 1428    Koleen Distance, MD 11/11/20 [phone]

## 2020-11-11 NOTE — ED Triage Notes (Signed)
The pt arrived by gems from home  c/o chest pain for 3 days  he was in this ed last pm and the day before with the same complaint

## 2020-11-11 NOTE — ED Triage Notes (Signed)
The pt reports that his heart is beating out of his chest and that he is sob  although his respirations are 16

## 2020-11-11 NOTE — ED Notes (Signed)
Called pt for vitals, no response. Moving to OTF 

## 2020-11-11 NOTE — ED Notes (Signed)
The pt walked out the door

## 2020-11-11 NOTE — ED Triage Notes (Signed)
Pt states he thinks he may be having an allergic reaction to Topamax that he started approx 1 week ago.  Reports panic attack, slurred speech, palpitations, headache, confusion, L sided chest pain, back pain, and L arm since starting medication.

## 2020-11-11 NOTE — ED Notes (Signed)
No answer- SORT staff states pt went outside

## 2020-11-11 NOTE — ED Triage Notes (Signed)
Patient arrives escorted by [employer]. Patient called dispatch complaining of anxiety symptoms. Patient checked in multiple times for same, but has not stayed for work up, or has left AMA. Patient complaining of chest pain r/t his anxiety.

## 2020-11-11 NOTE — ED Notes (Signed)
The pt picked up his labels left triage saying he spoke as he left without getting his labwork that he was not staying here being seen by all these crackers

## 2020-11-11 NOTE — ED Notes (Signed)
Called pt for vitals check x1

## 2020-11-11 NOTE — ED Notes (Signed)
Patient noted to have eloped from department. This RN and PA spoke with patient multiple times about staying. PA made aware.

## 2020-11-11 NOTE — ED Notes (Addendum)
While this RN was in a room with a patient. Patient asked to leave. Dahlia Client, PA to talk with patient about his care. Discussed with patient previously about staying to finish his work up to completion.

## 2020-11-11 NOTE — ED Triage Notes (Signed)
Pt via POV c/o new onset of palpitations for several days along with left arm pain, sweating, "feeling like my speech off." Endorses mariguana use a couple days ago.  Started topamax x1 week, feels like he is having an allergic reaction to medication and has stopped taking medication.

## 2020-11-11 NOTE — ED Notes (Signed)
Pt refused labs and states he is leaving.  Theron Arista, PA notified.

## 2020-11-11 NOTE — ED Provider Notes (Signed)
Emergency Medicine Provider Triage Evaluation Note  [name] , a 25 y.o. male  was evaluated in triage.  Pt complains of continued palpitations, chest pain, left arm pain, sweating, difficulty sleeping for the past several days. Pt reports starting Topamax 1 week ago for weight loss and is concerned he could be having an allergic reaction to it however has not taken it in about 3-4 days. He has been in the ED several times over the past few days for same however continues to elope/Leave without being seen. Pt was evaluated by PA at [facility] overnight with pending TTS eval however pt left prior to same. He presented to this ED earlier today however again left before being seen. Pt smoked marijuana a couple of days ago however none since then. Denies any other drug use or EtOH. No SI, HI, or AVH.   Review of Systems  Positive: + heart palpitations, SOB, chest pain Negative: - SI, HI, AVH  Physical Exam  BP 109/60 (BP Location: Left Arm)   Pulse (!) 57   Temp 98.7 F (37.1 C) (Oral)   Resp 17   Ht 5\' 9"  (1.753 m)   Wt 65.8 kg   SpO2 100%   BMI 21.41 kg/m  Gen:   Awake, no distress   Resp:  Normal effort  MSK:   Moves extremities without difficulty  Other:    Medical Decision Making  Medically screening exam initiated at 7:49 PM.  Appropriate orders placed.  [name] was informed that the remainder of the evaluation will be completed by another provider, this initial triage assessment does not replace that evaluation, and the importance of remaining in the ED until their evaluation is complete.     [name], PA-C 11/11/20 1951    01/12/21, MD 11/11/20 2027

## 2020-11-11 NOTE — ED Provider Notes (Signed)
[facility]-EMERGENCY DEPT Provider Note   CSN: [id number] Arrival date & time: 11/11/20  9833     History Chief Complaint  Patient presents with   Anxiety   Chest Pain    [name] is a 25 y.o. male presents to the Emergency Department complaining of intermittent progressively worsening"pani attacks" onset 3-4 days ago.  Patient reports when this happens he has associated centralized chest pain and pressure along with shortness of breath.  At times he has numbness and tingling of his fingers/toes.  Patient reports he has attempted to be seen several times over the last few days but has been unable to wait for evaluation.  Denies a history of cardiac problems.  Does report a history of anorexia and states he is currently taking Topamax to help him lose weight.  Reports he smokes cigarettes and cigars multiple times throughout the day.  Drinks intermittently and has recently started smoking marijuana.  He does report that the marijuana makes his anxiety worse.  Reports that he is previously taken Abilify for his depression and anxiety but is not currently on any medications.  Does state that he has an act team but has not contacted them about this.  Tonight patient again became anxious causing him to call 911.  Patient brought to the emergency department by [employer].  Patient reports some family stressors over the last 6 months but no other social aggravating factors.  Patient denies alleviating factors.  Patient denies suicidal ideation, homicidal ideation, auditory or visual hallucinations.   The history is provided by the patient and medical records. No language interpreter was used.      Past Medical History:  Diagnosis Date   ADD (attention deficit disorder)    ADHD (attention deficit hyperactivity disorder), 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   Anemia    Anxiety    Bipolar disorder (HCC)    Borderline personality disorder (HCC)    Central auditory  processing disorder    Deliberate self-cutting    Depressed    Eczema    Headache(784.0)    Oppositional defiant disorder    Schizophrenia [facility])     Patient Active Problem List   Diagnosis Date Noted   Schizoaffective disorder, bipolar type (HCC) 09/16/2016   Eating disorder Unspecified 05/22/2016   Borderline personality disorder (HCC) 05/16/2016   Tobacco use disorder 05/15/2016    Past Surgical History:  Procedure Laterality Date   BACK SURGERY     FRACTURE SURGERY     NO PAST SURGERIES     POSTERIOR LUMBAR FUSION N/A 02/19/2015   Procedure: LATERAL L-2 CORPECTOMY;  Surgeon: Lisbeth Renshaw, MD;  Location: [facility] OR;  Service: Neurosurgery;  Laterality: N/A;   POSTERIOR LUMBAR FUSION 4 LEVEL  02/19/2015   Procedure: Posterior T-12 - L-4 STABILIZATION OF POSTERIOR LUMBAR;  Surgeon: Lisbeth Renshaw, MD;  Location: [facility] OR;  Service: Neurosurgery;;   TIBIA IM NAIL INSERTION Left 02/20/2015   Procedure: INTRAMEDULLARY (IM) NAIL LEFT TIBIAL;  Surgeon: [name], MD;  Location: [facility] OR;  Service: Orthopedics;  Laterality: Left;       Family History  Problem Relation Age of Onset   Healthy Mother    Healthy Father     Social History   Tobacco Use   Smoking status: Every Day    Packs/day: 2.00    Pack years: 0.00    Types: Cigarettes   Smokeless tobacco: Never   Tobacco comments:    1-3 packs   Vaping Use  Vaping Use: Former  Substance Use Topics   Alcohol use: Not Currently    Alcohol/week: 2.0 standard drinks    Types: 2 Shots of liquor per week   Drug use: Not Currently    Types: Cocaine, Marijuana, Benzodiazepines, Heroin    Comment: heroin    Home Medications Prior to Admission medications   Medication Sig Start Date End Date Taking? Authorizing Provider  benztropine (COGENTIN) 1 MG tablet Take 1 tablet (1 mg total) by mouth 2 (two) times daily. 05/04/19   Malvin Johns, MD  haloperidol decanoate (HALDOL DECANOATE) 50 MG/ML injection Inject 4 mLs (200 mg  total) into the muscle every 28 (twenty-eight) days. Due 05/29/2019 05/04/19   Malvin Johns, MD  lithium carbonate (ESKALITH) 450 MG CR tablet Take 1 tablet (450 mg total) by mouth every 12 (twelve) hours. 05/04/19   Malvin Johns, MD  topiramate (TOPAMAX) 50 MG tablet Take 50 mg by mouth in the morning, at noon, and at bedtime.    [provider]  traZODone (DESYREL) 150 MG tablet Take 1 tablet (150 mg total) by mouth at bedtime. 03/09/19   Malvin Johns, MD    Allergies    Clozapine, Prolixin [fluphenazine], and Risperdal [risperidone]  Review of Systems   Review of Systems  Constitutional:  Negative for appetite change, diaphoresis, fatigue, fever and unexpected weight change.  HENT:  Negative for mouth sores.   Eyes:  Negative for visual disturbance.  Respiratory:  Positive for shortness of breath. Negative for cough, chest tightness and wheezing.   Cardiovascular:  Positive for chest pain.  Gastrointestinal:  Negative for abdominal pain, constipation, diarrhea, nausea and vomiting.  Endocrine: Negative for polydipsia, polyphagia and polyuria.  Genitourinary:  Negative for dysuria, frequency, hematuria and urgency.  Musculoskeletal:  Negative for back pain and neck stiffness.  Skin:  Negative for rash.  Allergic/Immunologic: Negative for immunocompromised state.  Neurological:  Negative for syncope, light-headedness and headaches.  Hematological:  Does not bruise/bleed easily.  Psychiatric/Behavioral:  Negative for sleep disturbance. The patient is nervous/anxious.    Physical Exam Updated Vital Signs BP 117/73   Pulse (!) 49   Resp 14   SpO2 99%   Physical Exam Vitals and nursing note reviewed.  Constitutional:      General: He is not in acute distress.    Appearance: He is not diaphoretic.  HENT:     Head: Normocephalic.  Eyes:     General: No scleral icterus.    Conjunctiva/sclera: Conjunctivae normal.  Cardiovascular:     Rate and Rhythm: Normal rate and  regular rhythm.     Pulses: Normal pulses.          Radial pulses are 2+ on the right side and 2+ on the left side.     Heart sounds: Normal heart sounds.  Pulmonary:     Effort: Pulmonary effort is normal. No tachypnea, accessory muscle usage, prolonged expiration, respiratory distress or retractions.     Breath sounds: Normal breath sounds. No stridor.     Comments: Equal chest rise. No increased work of breathing. Abdominal:     General: There is no distension.     Palpations: Abdomen is soft.     Tenderness: There is no abdominal tenderness. There is no guarding or rebound.  Musculoskeletal:     Cervical back: Normal range of motion.     Comments: Moves all extremities equally and without difficulty.  Skin:    General: Skin is warm and dry.  Capillary Refill: Capillary refill takes less than 2 seconds.  Neurological:     Mental Status: He is alert.     GCS: GCS eye subscore is 4. GCS verbal subscore is 5. GCS motor subscore is 6.     Comments: Speech is clear and goal oriented.  Psychiatric:        Mood and Affect: Mood is anxious.    ED Results / Procedures / Treatments   Labs (all labs ordered are listed, but only abnormal results are displayed) Labs Reviewed  RESP PANEL BY RT-PCR (FLU A&B, COVID) ARPGX2  RAPID URINE DRUG SCREEN, HOSP PERFORMED  ETHANOL    EKG EKG Interpretation  Date/Time:  Saturday November 11 2020 05:50:19 EDT Ventricular Rate:  48 PR Interval:  163 QRS Duration: 100 QT Interval:  390 QTC Calculation: 349 R Axis:   78 Text Interpretation: Sinus bradycardia Atrial premature complex No significant change was found Confirmed by Drema Pry [phone]) on 11/11/2020 5:55:30 AM  Radiology DG Chest 2 View  Result Date: 11/10/2020 CLINICAL DATA:  25 year old male with palpitations. Shortness of breath and left chest pain. EXAM: CHEST - 2 VIEW COMPARISON:  Chest radiograph 11/08/2020 and earlier. FINDINGS: Lung volumes and mediastinal contours remain  normal. Visualized tracheal air column is within normal limits. Both lungs appear stable and clear. No pneumothorax or pleural effusion. Thoracolumbar posterior spinal fusion and upper lumbar corpectomy changes again noted. No acute osseous abnormality identified. Negative visible bowel gas pattern. IMPRESSION: 1.  Stable, no cardiopulmonary abnormality. 2. Prior thoracolumbar surgery. Electronically Signed   By: Odessa Fleming M.D.   On: 11/10/2020 05:24    Procedures Procedures   Medications Ordered in ED Medications - No data to display  ED Course  I have reviewed the triage vital signs and the nursing notes.  Pertinent labs & imaging results that were available during my care of the patient were reviewed by me and considered in my medical decision making (see chart for details).    MDM Rules/Calculators/A&P                          Patient presents with complaints of anxiety and associated chest pain.  He is overall well-appearing.  Records reviewed.  Patient has been evaluated for this multiple times in the last few days though rarely stays to finish his evaluation.  Of note, patient with negative chest pain work-up this morning at 4 AM at [facility].  At this time there is no reason to do additional chest pain work-up.  Patient is minimally cooperative with history and physical.  Will initiate TTS evaluation.  UDS, COVID and EtOH pending.    Dispo per TTS.  6:46 AM Pt left AMA after discussion of need to stay for further evaluation.     Final Clinical Impression(s) / ED Diagnoses Final diagnoses:  Anxiety    Rx / DC Orders ED Discharge Orders     None        Markeria Goetsch, Boyd Kerbs 11/11/20 2025    [name], MD 11/11/20 [phone]

## 2020-11-12 ENCOUNTER — Other Ambulatory Visit (HOSPITAL_COMMUNITY): Payer: BC Managed Care – PPO

## 2020-11-12 ENCOUNTER — Emergency Department (HOSPITAL_COMMUNITY)
Admission: EM | Admit: 2020-11-12 | Discharge: 2020-11-12 | Disposition: A | Discharge status: Home / Self Care | Payer: BC Managed Care – PPO | Attending: Emergency Medicine | Admitting: Emergency Medicine

## 2020-11-12 ENCOUNTER — Other Ambulatory Visit: Payer: Self-pay

## 2020-11-12 ENCOUNTER — Emergency Department (HOSPITAL_COMMUNITY): Payer: BC Managed Care – PPO

## 2020-11-12 ENCOUNTER — Ambulatory Visit (HOSPITAL_COMMUNITY)
Admission: EM | Admit: 2020-11-12 | Discharge: 2020-11-12 | Disposition: A | Discharge status: Home / Self Care | Payer: BC Managed Care – PPO

## 2020-11-12 DIAGNOSIS — R079 Chest pain, unspecified: Secondary | ICD-10-CM

## 2020-11-12 DIAGNOSIS — E0590 Thyrotoxicosis, unspecified without thyrotoxic crisis or storm: Secondary | ICD-10-CM

## 2020-11-12 DIAGNOSIS — F17210 Nicotine dependence, cigarettes, uncomplicated: Secondary | ICD-10-CM | POA: Insufficient documentation

## 2020-11-12 LAB — CBC WITH DIFFERENTIAL/PLATELET
Abs Immature Granulocytes: 0.01 10*3/uL (ref 0.00–0.07)
Basophils Absolute: 0 10*3/uL (ref 0.0–0.1)
Basophils Relative: 0 %
Eosinophils Absolute: 0 10*3/uL (ref 0.0–0.5)
Eosinophils Relative: 1 %
HCT: 46.1 % (ref 39.0–52.0)
Hemoglobin: 15.1 g/dL (ref 13.0–17.0)
Immature Granulocytes: 0 %
Lymphocytes Relative: 39 %
Lymphs Abs: 1.9 10*3/uL (ref 0.7–4.0)
MCH: 27.4 pg (ref 26.0–34.0)
MCHC: 32.8 g/dL (ref 30.0–36.0)
MCV: 83.7 fL (ref 80.0–100.0)
Monocytes Absolute: 0.3 10*3/uL (ref 0.1–1.0)
Monocytes Relative: 7 %
Neutro Abs: 2.5 10*3/uL (ref 1.7–7.7)
Neutrophils Relative %: 53 %
Platelets: 230 10*3/uL (ref 150–400)
RBC: 5.51 MIL/uL (ref 4.22–5.81)
RDW: 13.4 % (ref 11.5–15.5)
WBC: 4.8 10*3/uL (ref 4.0–10.5)
nRBC: 0 % (ref 0.0–0.2)

## 2020-11-12 LAB — BASIC METABOLIC PANEL
Anion gap: 8 (ref 5–15)
BUN: 9 mg/dL (ref 6–20)
CO2: 24 mmol/L (ref 22–32)
Calcium: 9.9 mg/dL (ref 8.9–10.3)
Chloride: 106 mmol/L (ref 98–111)
Creatinine, Ser: 0.84 mg/dL (ref 0.61–1.24)
GFR, Estimated: >60 mL/min (ref >60)
Glucose, Bld: 89 mg/dL (ref 70–99)
Potassium: 3.6 mmol/L (ref 3.5–5.1)
Sodium: 138 mmol/L (ref 135–145)

## 2020-11-12 LAB — TROPONIN I (HIGH SENSITIVITY)
Troponin I (High Sensitivity): <2 ng/L (ref <18)
Troponin I (High Sensitivity): 3 ng/L (ref <18)

## 2020-11-12 LAB — D-DIMER, QUANTITATIVE: D-Dimer, Quant: 0.37 ug/mL-FEU (ref 0.00–0.50)

## 2020-11-12 LAB — VITAMIN B12: Vitamin B-12: 665 pg/mL (ref 180–914)

## 2020-11-12 LAB — TSH: TSH: 0.322 u[IU]/mL — ABNORMAL LOW (ref 0.350–4.500)

## 2020-11-12 MED ORDER — SODIUM CHLORIDE 0.9 % IV BOLUS
1000.0000 mL | Freq: Once | INTRAVENOUS | Status: AC
  Administered 2020-11-12: 1000 mL via INTRAVENOUS

## 2020-11-12 NOTE — ED Triage Notes (Signed)
Patient seen in ED for same cp. States he wants recheck because same pain x  days. Patient states pain intermittent and smoker. Radiation to left arm, NAD

## 2020-11-12 NOTE — ED Provider Notes (Signed)
[facility] EMERGENCY DEPARTMENT Provider Note   CSN: [id number] Arrival date & time: 11/12/20  1157     History No chief complaint on file.   [name] is a 25 y.o. male.  [name] presents for evaluation of chest pain that is mainly in his left arm.  It is an electric feeling, and he states that it increases when his stress level increases.  He does struggle with his mental health, and he states that he has an act team.  He is taking Topamax as a mood stabilizer and sees a therapist twice a month.  He has been to the emergency department multiple times recently and has had cardiac enzymes performed.  He is also had a chest x-ray and urine drug screen.  He struggles with body dysmorphia, and he has a very restrictive, alkaline diet.  He does not eat meat or dairy.  The history is provided by the patient.  Chest Pain Pain location:  L chest Pain quality comment:  Like electricity Pain radiates to:  L arm Pain severity:  Mild Onset quality:  Gradual Duration:  1 week Timing:  Intermittent Progression:  Waxing and waning Chronicity:  New Context: stress   Relieved by:  Nothing Worsened by:  Nothing Ineffective treatments:  None tried Associated symptoms: anxiety   Associated symptoms: no abdominal pain, no altered mental status, no back pain, no cough, no diaphoresis, no fatigue, no fever, no lower extremity edema, no nausea, no palpitations, no shortness of breath and no vomiting   Risk factors: smoking       Past Medical History:  Diagnosis Date   ADD (attention deficit disorder)    ADHD (attention deficit hyperactivity disorder), 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   Anemia    Anxiety    Bipolar disorder (HCC)    Borderline personality disorder (HCC)    Central auditory processing disorder    Deliberate self-cutting    Depressed    Eczema    Headache(784.0)    Oppositional defiant disorder     Schizophrenia (HCC)     Patient Active Problem List   Diagnosis Date Noted   Schizoaffective disorder, bipolar type (HCC) 09/16/2016   Eating disorder Unspecified 05/22/2016   Borderline personality disorder (HCC) 05/16/2016   Tobacco use disorder 05/15/2016    Past Surgical History:  Procedure Laterality Date   BACK SURGERY     FRACTURE SURGERY     NO PAST SURGERIES     POSTERIOR LUMBAR FUSION N/A 02/19/2015   Procedure: LATERAL L-2 CORPECTOMY;  Surgeon: Lisbeth Renshaw, MD;  Location: [facility] OR;  Service: Neurosurgery;  Laterality: N/A;   POSTERIOR LUMBAR FUSION 4 LEVEL  02/19/2015   Procedure: Posterior T-12 - L-4 STABILIZATION OF POSTERIOR LUMBAR;  Surgeon: Lisbeth Renshaw, MD;  Location: [facility] OR;  Service: Neurosurgery;;   TIBIA IM NAIL INSERTION Left 02/20/2015   Procedure: INTRAMEDULLARY (IM) NAIL LEFT TIBIAL;  Surgeon: [name], MD;  Location: [facility] OR;  Service: Orthopedics;  Laterality: Left;       Family History  Problem Relation Age of Onset   Healthy Mother    Healthy Father     Social History   Tobacco Use   Smoking status: Every Day    Packs/day: 2.00    Pack years: 0.00    Types: Cigarettes   Smokeless tobacco: Never   Tobacco comments:    1-3 packs   Vaping Use   Vaping Use: Former  Substance Use  Topics   Alcohol use: Not Currently    Alcohol/week: 2.0 standard drinks    Types: 2 Shots of liquor per week   Drug use: Yes    Types: Marijuana    Comment: heroin    Home Medications Prior to Admission medications   Medication Sig Start Date End Date Taking? Authorizing Provider  benztropine (COGENTIN) 1 MG tablet Take 1 tablet (1 mg total) by mouth 2 (two) times daily. 05/04/19   Malvin Johns, MD  haloperidol decanoate (HALDOL DECANOATE) 50 MG/ML injection Inject 4 mLs (200 mg total) into the muscle every 28 (twenty-eight) days. Due 05/29/2019 05/04/19   Malvin Johns, MD  lithium carbonate (ESKALITH) 450 MG CR tablet Take 1 tablet (450 mg total) by  mouth every 12 (twelve) hours. 05/04/19   Malvin Johns, MD  topiramate (TOPAMAX) 50 MG tablet Take 50 mg by mouth in the morning, at noon, and at bedtime.    [provider]  traZODone (DESYREL) 150 MG tablet Take 1 tablet (150 mg total) by mouth at bedtime. 03/09/19   Malvin Johns, MD    Allergies    Clozapine, Prolixin [fluphenazine], and Risperdal [risperidone]  Review of Systems   Review of Systems  Constitutional:  Negative for chills, diaphoresis, fatigue and fever.  HENT:  Negative for ear pain and sore throat.   Eyes:  Negative for pain and visual disturbance.  Respiratory:  Negative for cough and shortness of breath.   Cardiovascular:  Positive for chest pain. Negative for palpitations.  Gastrointestinal:  Negative for abdominal pain, nausea and vomiting.  Endocrine: Positive for cold intolerance.  Genitourinary:  Negative for dysuria and hematuria.  Musculoskeletal:  Negative for arthralgias and back pain.  Skin:  Positive for color change. Negative for rash.       Hands and nails seem more yellow to him   Neurological:  Negative for seizures and syncope.  All other systems reviewed and are negative.  Physical Exam Updated Vital Signs BP 104/60 (BP Location: Right Arm)   Pulse (!) 122   Temp 97.8 F (36.6 C) (Oral)   Resp (!) 24   SpO2 100%   Physical Exam Vitals and nursing note reviewed.  Constitutional:      Appearance: Normal appearance.  HENT:     Head: Normocephalic and atraumatic.  Cardiovascular:     Rate and Rhythm: Regular rhythm. Tachycardia present.     Heart sounds: Normal heart sounds.  Pulmonary:     Effort: Pulmonary effort is normal.     Breath sounds: Normal breath sounds.  Musculoskeletal:     Cervical back: Normal range of motion.     Right lower leg: No edema.     Left lower leg: No edema.  Skin:    General: Skin is warm and dry.     Comments: Palms are pale without obvious skin rash or lesions  Neurological:     General:  No focal deficit present.     Mental Status: He is alert and oriented to person, place, and time.  Psychiatric:        Mood and Affect: Mood normal.        Behavior: Behavior normal.    ED Results / Procedures / Treatments   Labs (all labs ordered are listed, but only abnormal results are displayed) Labs Reviewed  TSH - Abnormal; Notable for the following components:      Result Value   TSH 0.322 (*)    All other components within normal limits  BASIC METABOLIC PANEL  CBC WITH DIFFERENTIAL/PLATELET  D-DIMER, QUANTITATIVE  VITAMIN B12  TROPONIN I (HIGH SENSITIVITY)  TROPONIN I (HIGH SENSITIVITY)    EKG None  Radiology DG Chest 2 View  Result Date: 11/12/2020 CLINICAL DATA:  Acute chest pain for 3 days. EXAM: CHEST - 2 VIEW COMPARISON:  11/10/2020 and prior studies FINDINGS: The cardiomediastinal silhouette is unremarkable. There is no evidence of focal airspace disease, pulmonary edema, suspicious pulmonary nodule/mass, pleural effusion, or pneumothorax. No acute bony abnormalities are identified. Thoracolumbar surgical hardware again noted. IMPRESSION: No active cardiopulmonary disease. Electronically Signed   By: Harmon Pier M.D.   On: 11/12/2020 12:52    Procedures Procedures   Medications Ordered in ED Medications  sodium chloride 0.9 % bolus 1,000 mL (0 mLs Intravenous Stopped 11/12/20 1724)    ED Course  I have reviewed the triage vital signs and the nursing notes.  Pertinent labs & imaging results that were available during my care of the patient were reviewed by me and considered in my medical decision making (see chart for details).    MDM Rules/Calculators/A&P                          [name] scented for chest pain.  He endorsed being anxious, and he has had numerous recent ED visits.  He was noted to be tachycardic, and I evaluated him for evidence of dehydration, nutritional deficiency, PE.  TSH was slightly low, and  unfortunately, he eloped from the ED prior to me being able to convey this info to him.  I did attempt to contact his legal guardian, his grandmother Merdis Delay, but her phone number was not in service. Final Clinical Impression(s) / ED Diagnoses Final diagnoses:  Chest pain, unspecified type  Hyperthyroidism    Rx / DC Orders ED Discharge Orders     None        Koleen Distance, MD 11/12/20 2059

## 2020-11-12 NOTE — ED Provider Notes (Signed)
Emergency Medicine Provider Triage Evaluation Note  [name] , a 25 y.o. male  was evaluated in triage.  Pt complains of palpitations, chest pain, and shortness of breath.  Patient reports that his symptoms began this morning at approximately 1000 after he woke and smoke a cigarette.  Patient reports that this is similar to the same symptoms he has been having over the last few days.  Patient has been seen multiple times in this emergency department over the last few days for similar complaints.  Patient continues to elope or leave without being seen.  Patient denies any suicidal ideations, homicidal ideations, auditory or visual hallucinations.  Review of Systems  Positive: Chest pain, shortness of breath, palpitations Negative: SI, HI, AVH  Physical Exam  BP 104/60 (BP Location: Right Arm)   Pulse (!) 122   Temp 97.8 F (36.6 C) (Oral)   Resp (!) 24   SpO2 100%  Gen:   Awake, no distress   Resp:  Normal effort, lungs clear to auscultation bilaterally MSK:   Moves extremities without difficulty, no swelling/edema/tenderness to bilateral lower extremities Other:  S1, S2 present with no murmurs rubs or gallops.  Medical Decision Making  Medically screening exam initiated at 12:22 PM.  Appropriate orders placed.  [name] was informed that the remainder of the evaluation will be completed by another provider, this initial triage assessment does not replace that evaluation, and the importance of remaining in the ED until their evaluation is complete.  The patient appears stable so that the remainder of the work up may be completed by another provider.      Haskel Schroeder, PA-C 11/12/20 1224    Koleen Distance, MD 11/12/20 [phone]

## 2020-11-12 NOTE — ED Notes (Signed)
Pt states needs to be seen for Acute Heart Failure, advised pt that We do not have the resources to dx pt with Acute Heart Failure. Pt left and went back to ED.

## 2020-11-14 ENCOUNTER — Ambulatory Visit (HOSPITAL_COMMUNITY)
Admission: EM | Admit: 2020-11-14 | Discharge: 2020-11-14 | Disposition: A | Discharge status: Home / Self Care | Payer: BC Managed Care – PPO | Attending: Nurse Practitioner | Admitting: Nurse Practitioner

## 2020-11-14 DIAGNOSIS — Z5900 Homelessness unspecified: Secondary | ICD-10-CM | POA: Diagnosis not present

## 2020-11-14 DIAGNOSIS — Z20822 Contact with and (suspected) exposure to covid-19: Secondary | ICD-10-CM | POA: Diagnosis not present

## 2020-11-14 DIAGNOSIS — F1290 Cannabis use, unspecified, uncomplicated: Secondary | ICD-10-CM | POA: Diagnosis not present

## 2020-11-14 DIAGNOSIS — F411 Generalized anxiety disorder: Secondary | ICD-10-CM

## 2020-11-14 DIAGNOSIS — F17210 Nicotine dependence, cigarettes, uncomplicated: Secondary | ICD-10-CM | POA: Insufficient documentation

## 2020-11-14 DIAGNOSIS — F419 Anxiety disorder, unspecified: Secondary | ICD-10-CM | POA: Diagnosis not present

## 2020-11-14 DIAGNOSIS — Z79899 Other long term (current) drug therapy: Secondary | ICD-10-CM | POA: Diagnosis not present

## 2020-11-14 DIAGNOSIS — F259 Schizoaffective disorder, unspecified: Secondary | ICD-10-CM | POA: Diagnosis not present

## 2020-11-14 DIAGNOSIS — F209 Schizophrenia, unspecified: Secondary | ICD-10-CM | POA: Diagnosis present

## 2020-11-14 DIAGNOSIS — Z9152 Personal history of nonsuicidal self-harm: Secondary | ICD-10-CM | POA: Insufficient documentation

## 2020-11-14 DIAGNOSIS — F603 Borderline personality disorder: Secondary | ICD-10-CM | POA: Diagnosis not present

## 2020-11-14 DIAGNOSIS — F319 Bipolar disorder, unspecified: Secondary | ICD-10-CM | POA: Diagnosis not present

## 2020-11-14 LAB — POC SARS CORONAVIRUS 2 AG: SARSCOV2ONAVIRUS 2 AG: NEGATIVE

## 2020-11-14 LAB — CBC WITH DIFFERENTIAL/PLATELET
Abs Immature Granulocytes: 0.02 10*3/uL (ref 0.00–0.07)
Basophils Absolute: 0 10*3/uL (ref 0.0–0.1)
Basophils Relative: 0 %
Eosinophils Absolute: 0.1 10*3/uL (ref 0.0–0.5)
Eosinophils Relative: 2 %
HCT: 46.6 % (ref 39.0–52.0)
Hemoglobin: 15.3 g/dL (ref 13.0–17.0)
Immature Granulocytes: 0 %
Lymphocytes Relative: 43 %
Lymphs Abs: 2.9 10*3/uL (ref 0.7–4.0)
MCH: 28.1 pg (ref 26.0–34.0)
MCHC: 32.8 g/dL (ref 30.0–36.0)
MCV: 85.5 fL (ref 80.0–100.0)
Monocytes Absolute: 0.4 10*3/uL (ref 0.1–1.0)
Monocytes Relative: 5 %
Neutro Abs: 3.4 10*3/uL (ref 1.7–7.7)
Neutrophils Relative %: 50 %
Platelets: 231 10*3/uL (ref 150–400)
RBC: 5.45 MIL/uL (ref 4.22–5.81)
RDW: 13.8 % (ref 11.5–15.5)
WBC: 6.9 10*3/uL (ref 4.0–10.5)
nRBC: 0 % (ref 0.0–0.2)

## 2020-11-14 LAB — COMPREHENSIVE METABOLIC PANEL
ALT: 14 U/L (ref 0–44)
AST: 18 U/L (ref 15–41)
Albumin: 4.2 g/dL (ref 3.5–5.0)
Alkaline Phosphatase: 71 U/L (ref 38–126)
Anion gap: 11 (ref 5–15)
BUN: 13 mg/dL (ref 6–20)
CO2: 24 mmol/L (ref 22–32)
Calcium: 9.8 mg/dL (ref 8.9–10.3)
Chloride: 106 mmol/L (ref 98–111)
Creatinine, Ser: 0.85 mg/dL (ref 0.61–1.24)
GFR, Estimated: >60 mL/min (ref >60)
Glucose, Bld: 75 mg/dL (ref 70–99)
Potassium: 3.7 mmol/L (ref 3.5–5.1)
Sodium: 141 mmol/L (ref 135–145)
Total Bilirubin: 0.5 mg/dL (ref 0.3–1.2)
Total Protein: 6.8 g/dL (ref 6.5–8.1)

## 2020-11-14 LAB — POCT URINE DRUG SCREEN - MANUAL ENTRY (I-SCREEN)
POC Amphetamine UR: NOT DETECTED
POC Buprenorphine (BUP): NOT DETECTED
POC Cocaine UR: NOT DETECTED
POC Marijuana UR: NOT DETECTED
POC Methadone UR: NOT DETECTED
POC Methamphetamine UR: NOT DETECTED
POC Morphine: NOT DETECTED
POC Oxazepam (BZO): NOT DETECTED
POC Oxycodone UR: NOT DETECTED
POC Secobarbital (BAR): NOT DETECTED

## 2020-11-14 LAB — LIPID PANEL
Cholesterol: 179 mg/dL (ref 0–200)
HDL: 52 mg/dL (ref >40)
LDL Cholesterol: 104 mg/dL — ABNORMAL HIGH (ref 0–99)
Total CHOL/HDL Ratio: 3.4 RATIO
Triglycerides: 115 mg/dL (ref <150)
VLDL: 23 mg/dL (ref 0–40)

## 2020-11-14 LAB — ETHANOL: Alcohol, Ethyl (B): <10 mg/dL (ref <10)

## 2020-11-14 LAB — RESP PANEL BY RT-PCR (FLU A&B, COVID) ARPGX2
Influenza A by PCR: NEGATIVE
Influenza B by PCR: NEGATIVE
SARS Coronavirus 2 by RT PCR: NEGATIVE

## 2020-11-14 LAB — HEMOGLOBIN A1C
Hgb A1c MFr Bld: 5.4 % (ref 4.8–5.6)
Mean Plasma Glucose: 108.28 mg/dL

## 2020-11-14 LAB — POC SARS CORONAVIRUS 2 AG -  ED: SARS Coronavirus 2 Ag: NEGATIVE

## 2020-11-14 LAB — TSH: TSH: 0.742 u[IU]/mL (ref 0.350–4.500)

## 2020-11-14 MED ORDER — TOPIRAMATE 25 MG PO TABS
50.0000 mg | ORAL_TABLET | Freq: Three times a day (TID) | ORAL | Status: DC
  Administered 2020-11-14 (×2): 50 mg via ORAL
  Filled 2020-11-14 (×2): qty 2

## 2020-11-14 MED ORDER — ACETAMINOPHEN 325 MG PO TABS
650.0000 mg | ORAL_TABLET | Freq: Four times a day (QID) | ORAL | Status: DC | PRN
  Administered 2020-11-14: 650 mg via ORAL
  Filled 2020-11-14: qty 2

## 2020-11-14 MED ORDER — ALUM & MAG HYDROXIDE-SIMETH 200-200-20 MG/5ML PO SUSP: 30.0000 mL | ORAL | Status: DC | PRN

## 2020-11-14 MED ORDER — HYDROXYZINE HCL 25 MG PO TABS: 25.0000 mg | ORAL_TABLET | Freq: Three times a day (TID) | ORAL | 0 refills | Status: AC | PRN

## 2020-11-14 MED ORDER — HYDROXYZINE HCL 25 MG PO TABS: 25.0000 mg | ORAL_TABLET | Freq: Three times a day (TID) | ORAL | Status: DC | PRN

## 2020-11-14 MED ORDER — MAGNESIUM HYDROXIDE 400 MG/5ML PO SUSP: 30.0000 mL | Freq: Every day | ORAL | Status: DC | PRN

## 2020-11-14 MED ORDER — LORAZEPAM 1 MG PO TABS
1.0000 mg | ORAL_TABLET | Freq: Once | ORAL | Status: AC
  Administered 2020-11-14: 1 mg via ORAL
  Filled 2020-11-14: qty 1

## 2020-11-14 NOTE — ED Provider Notes (Signed)
[facility]/OBS ASAP Discharge Summary  Date and Time: 11/14/2020 8:12 AM  Name: [name]  MRN:  [id number]   Discharge Diagnoses:  Final diagnoses:  Schizoaffective disorder, unspecified type (HCC)  Anxiety state    Subjective: Pt is seen and examined today. Pt states his mood is fine . Pt states he doesn't want to rate his mood. Pt slept well last night. Pt states his appetite is good. Pt states he still feels anxious and rates his anxiety at 2/10 (10 is the worst anxiety) Currently, Pt denies any suicidal ideation, homicidal ideation and, visual and auditory hallucination. Pt denies any medication side effects and has been tolerating it well. He states his mom is going to call ACT team first thing in the morning but they don't do anything. States they don't check on him. When asked if he can go to his Mom today if he will be discharged. Pt gets irritated states "You are not listening". Pt didn't answer any question after that.  Stay Summary: [name] is a 25 y.o. male with a history of paranoid schizophrenia, schizoaffective disorder, bipolar disorder, and borderline personality disorder who presents to [facility] for anxiety.  Patient has presented to the ED or urgent care 14 times since October 15, 2020. Pt was admitted to the observation unit at [facility]. Pt was restarted on  Topamax and started on Hydroxyzine for anxiety. Pt was reevaluated this morning. Today, he reported improved mood and anxiety and denied SI, HI and AVH. Denied any side effects from medications. ACT team contacted and wanted to coordinate to provide him a ride but Pt got really irritable and demanded to leave. Stated he doesn't want to wait for the ACT team as they don't do anything. Attempted to call Mom twice (Mother is his legal Isabelle Course [phone]) but unable to talk to her as Phone  was going to voice mail. Unable to leave voice mail. Pt discharged with resources for homeless  shelters. Bus ticket provided.   Total Time spent with patient: 20 minutes  Past Psychiatric History: Schizophrenia, Schizoaffective disorder, Bipolar, Borderline personality disorder, Anxiety.  Past Medical History:  Past Medical History:  Diagnosis Date   ADD (attention deficit disorder)    ADHD (attention deficit hyperactivity disorder), 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   Anemia    Anxiety    Bipolar disorder (HCC)    Borderline personality disorder (HCC)    Central auditory processing disorder    Deliberate self-cutting    Depressed    Eczema    Headache(784.0)    Oppositional defiant disorder    Schizophrenia (HCC)     Past Surgical History:  Procedure Laterality Date   BACK SURGERY     FRACTURE SURGERY     NO PAST SURGERIES     POSTERIOR LUMBAR FUSION N/A 02/19/2015   Procedure: LATERAL L-2 CORPECTOMY;  Surgeon: Lisbeth Renshaw, MD;  Location: [facility] OR;  Service: Neurosurgery;  Laterality: N/A;   POSTERIOR LUMBAR FUSION 4 LEVEL  02/19/2015   Procedure: Posterior T-12 - L-4 STABILIZATION OF POSTERIOR LUMBAR;  Surgeon: Lisbeth Renshaw, MD;  Location: [facility] OR;  Service: Neurosurgery;;   TIBIA IM NAIL INSERTION Left 02/20/2015   Procedure: INTRAMEDULLARY (IM) NAIL LEFT TIBIAL;  Surgeon: [name], MD;  Location: [facility] OR;  Service: Orthopedics;  Laterality: Left;   Family History:  Family History  Problem Relation Age of Onset   Healthy Mother    Healthy Father    Family Psychiatric History: None per chart Social  History:  Social History   Substance and Sexual Activity  Alcohol Use Not Currently   Alcohol/week: 2.0 standard drinks   Types: 2 Shots of liquor per week     Social History   Substance and Sexual Activity  Drug Use Yes   Types: Marijuana   Comment: heroin    Social History   Socioeconomic History   Marital status: Single    Spouse name: Not on file   Number of children: Not on file   Years of education: Not on file   Highest education level: Not on  file  Occupational History   Occupation: Unemployed  Tobacco Use   Smoking status: Every Day    Packs/day: 2.00    Pack years: 0.00    Types: Cigarettes   Smokeless tobacco: Never   Tobacco comments:    1-3 packs   Vaping Use   Vaping Use: Former  Substance and Sexual Activity   Alcohol use: Not Currently    Alcohol/week: 2.0 standard drinks    Types: 2 Shots of liquor per week   Drug use: Yes    Types: Marijuana    Comment: heroin   Sexual activity: Yes    Birth control/protection: None    Comment: pt reluctant to answer questions and frequently stated " I dont know"  Other Topics Concern   Not on file  Social History Narrative   ** Merged History Encounter **    Pt reported that he is unemployed, homeless as his mother is in rehab   Social Determinants of Corporate investment banker Strain: Not on file  Food Insecurity: Not on file  Transportation Needs: Not on file  Physical Activity: Not on file  Stress: Not on file  Social Connections: Not on file   SDOH:  SDOH Screenings   Alcohol Screen: Not on file  Depression (PHQ2-9): Not on file  Financial Resource Strain: Not on file  Food Insecurity: Not on file  Housing: Not on file  Physical Activity: Not on file  Social Connections: Not on file  Stress: Not on file  Tobacco Use: High Risk   Smoking Tobacco Use: Every Day   Smokeless Tobacco Use: Never  Transportation Needs: Not on file    Tobacco Cessation:  A prescription for an FDA-approved tobacco cessation medication was offered at discharge and the patient refused  Current Medications:  Current Facility-Administered Medications  Medication Dose Route Frequency Provider Last Rate Last Admin   acetaminophen (TYLENOL) tablet 650 mg  650 mg Oral Q6H PRN Jackelyn Poling, NP   650 mg at 11/14/20 0206   alum & mag hydroxide-simeth (MAALOX/MYLANTA) 200-200-20 MG/5ML suspension 30 mL  30 mL Oral Q4H PRN [name] A, NP       hydrOXYzine (ATARAX/VISTARIL)  tablet 25 mg  25 mg Oral TID PRN [name] A, NP       magnesium hydroxide (MILK OF MAGNESIA) suspension 30 mL  30 mL Oral Daily PRN [name] A, NP       topiramate (TOPAMAX) tablet 50 mg  50 mg Oral TID WC [name] A, NP   50 mg at 11/14/20 0805   Current Outpatient Medications  Medication Sig Dispense Refill   benztropine (COGENTIN) 1 MG tablet Take 1 tablet (1 mg total) by mouth 2 (two) times daily. (Patient not taking: Reported on 11/14/2020) 60 tablet 3   haloperidol decanoate (HALDOL DECANOATE) 50 MG/ML injection Inject 4 mLs (200 mg total) into the muscle every 28 (  twenty-eight) days. Due 05/29/2019 (Patient not taking: Reported on 11/14/2020) 4 mL 11   lithium carbonate (ESKALITH) 450 MG CR tablet Take 1 tablet (450 mg total) by mouth every 12 (twelve) hours. (Patient not taking: Reported on 11/14/2020) 60 tablet 2   topiramate (TOPAMAX) 50 MG tablet Take 50 mg by mouth in the morning, at noon, and at bedtime.     traZODone (DESYREL) 150 MG tablet Take 1 tablet (150 mg total) by mouth at bedtime. (Patient not taking: Reported on 11/14/2020) 30 tablet 11    PTA Medications: (Not in a hospital admission)   Musculoskeletal  Strength & Muscle Tone: within normal limits Gait & Station: normal Patient leans: N/A  Psychiatric Specialty Exam  Presentation  General Appearance: Appropriate for Environment  Eye Contact:Minimal  Speech:Clear and Coherent; Normal Rate  Speech Volume:Decreased  Handedness:Right   Mood and Affect  Mood:Anxious; Irritable  Affect:Flat   Thought Process  Thought Processes:Coherent  Descriptions of Associations:Intact  Orientation:Full (Time, Place and Person)  Thought Content:WDL  Diagnosis of Schizophrenia or Schizoaffective disorder in past: Yes    Hallucinations:Hallucinations: None  Ideas of Reference:None  Suicidal Thoughts:Suicidal Thoughts: No  Homicidal Thoughts:Homicidal Thoughts: No   Sensorium  Memory:Immediate Fair;  Recent Fair; Remote Fair  Judgment:Fair  Insight:Present   Executive Functions  Concentration:Fair  Attention Span:Fair  Recall:Fair  Fund of Knowledge:Fair  Language:Fair   Psychomotor Activity  Psychomotor Activity:Psychomotor Activity: Normal   Assets  Assets:Communication Skills; Desire for Improvement; Social Support   Sleep  Sleep:Sleep: Good Number of Hours of Sleep: 7   Nutritional Assessment (For OBS and [facility]BC admissions only) Has the patient had a weight loss or gain of 10 pounds or more in the last 3 months?: No Has the patient had a decrease in food intake/or appetite?: Yes Does the patient have dental problems?: No Does the patient have eating habits or behaviors that may be indicators of an eating disorder including binging or inducing vomiting?: No Has the patient recently lost weight without trying?: No Has the patient been eating poorly because of a decreased appetite?: No Malnutrition Screening Tool Score: 0    Physical Exam  Physical Exam Vitals and nursing note reviewed.  Constitutional:      General: He is not in acute distress.    Appearance: Normal appearance. He is obese. He is not ill-appearing, toxic-appearing or diaphoretic.  HENT:     Head: Normocephalic and atraumatic.  Pulmonary:     Effort: Pulmonary effort is normal.  Neurological:     General: No focal deficit present.     Mental Status: He is alert and oriented to person, place, and time.   ROS cannot be completed as Pt uncooperative.   Blood pressure 112/69, pulse 60, temperature 97.9 F (36.6 C), temperature source Oral, resp. rate 16, SpO2 100 %. There is no height or weight on file to calculate BMI.  Demographic Factors:  Adolescent or young adult and Unemployed  Loss Factors: Homelessness  Historical Factors: Prior suicide attempts  Risk Reduction Factors:   Positive social support and Positive therapeutic relationship  Continued Clinical Symptoms:  Severe  Anxiety and/or Agitation Bipolar Disorder:   Mixed State Schizophrenia:   Paranoid or undifferentiated type  Cognitive Features That Contribute To Risk:  None    Suicide Risk:  Minimal: No identifiable suicidal ideation.  Patients presenting with no risk factors but with morbid ruminations; may be classified as minimal risk based on the severity of the depressive symptoms  Plan Of  Care/Follow-up recommendations:  Other:  Follow Up with ACT team.   Disposition: Discharge with resources to homeless shelters. Attempted to call Mom twice (Mother is his legal Isabelle Courseguardian-Joi Legrand [phone]) but unable to talk to her as Phone  was going to voice mail. Unable to leave voice mail. Pt discharged with resources for homeless shelters. Bus ticket provided. Pt will follow up with ACT team.   Karsten RoVandana  Ikia Cincotta, MD 11/14/2020, 8:12 AM

## 2020-11-14 NOTE — ED Notes (Signed)
Blue locker 

## 2020-11-14 NOTE — ED Notes (Signed)
Patient showered this AM.  Irritable.  Denied SI, HI, AVH. When asked about AVH replied, "I'm not a psychic medium, so no, I don't see or hear things. Why are you asking me that?"

## 2020-11-14 NOTE — ED Notes (Signed)
Pt taking a shower 

## 2020-11-14 NOTE — ED Notes (Addendum)
Patient to bed 1 in adult continuous assessment. Complaints of "chest pain radiating to left arm" Practitioner aware and prn given. Ordered meds given, patient given a snack. Will continue to monitor for safety

## 2020-11-14 NOTE — ED Notes (Signed)
Pt standing in front of the nurses station requesting to leave. Pt informed that he could not leave until he is discharged by the provider. Pt continues to stand in front of  the nurses station. RN and NP notified. Will continue to monitor.

## 2020-11-14 NOTE — ED Notes (Signed)
Meal given

## 2020-11-14 NOTE — ED Notes (Signed)
Patient resting at this time. No distress noted - continue to monitor for safety

## 2020-11-14 NOTE — Discharge Instructions (Addendum)
Prescriptions sent to pharmacy. Patient agreeable to plan.  Given opportunity to ask questions.  Appears to feel comfortable with discharge denies any current suicidal or homicidal thought. Patient is also instructed prior to discharge to: Take all medications as prescribed by his mental healthcare provider. Report any adverse effects and or reactions from the medicines to his outpatient provider promptly. Patient has been instructed & cautioned: To not engage in alcohol and or illegal drug use while on prescription medicines. In the event of worsening symptoms, patient is instructed to call the crisis hotline, 911 and or go to the nearest ED for appropriate evaluation and treatment of symptoms. To follow-up with his primary care provider for your other medical issues, concerns and or health care needs.

## 2020-11-14 NOTE — ED Notes (Signed)
Patient irritable; requesting to leave.  Stated he spoke with his ACTT by phone and doesn't want their help.  MD notified of patient request.

## 2020-11-14 NOTE — ED Notes (Signed)
Pt offered breakfast;declined. Pt was given hygiene items to take a shower.

## 2020-11-14 NOTE — ED Provider Notes (Addendum)
Behavioral Health Admission H&P [facility] & OBS)  Date: 11/14/20 Patient Name: [name] MRN: [id number] Chief Complaint:  Chief Complaint  Patient presents with  . Anxiety      Diagnoses:  Final diagnoses:  Schizoaffective disorder, unspecified type ([place])  Anxiety state    HPI: [name] is a 25 y.o. male with a history of paranoid schizophrenia, schizoaffective disorder, bipolar disorder, and borderline personality disorder who presents to [facility] for anxiety.  Patient has presented to the ED or urgent care 14 times since October 15, 2020. He has presented to the ED multiple times over the past few days for chest pain and anxiety. EKGs on 11/11/20 and 11/11/20 showed sinus bradycardia with no acute ischemia. On evaluation, patient is alert and oriented.  He is calm and cooperative.  His responses are delayed and minimal.  Reports mood as anxious.  Affect is constricted.  Patient states he asked EMS [facility] because of "my anxiety and stress the past few days."  Patient states that he has been feeling anxious the past several months.  States he moved out of his apartment into his aunt's house 7 months ago.  States that his anxiety has been worsening since moving in with his aunt.  He states that she kicked him out today.  He states "she felt like I was trying to get her attention and she asked me to leave."  Patient states that he needs assistance with learning to manage his anxiety.  He states that he does everything he knows to do, but it is not helping.  He reports deep breathing and cognitive reframing as coping mechanisms.  He states that he has contacted his ACT team but "they do not do anything."  Patient reports that he has a history of borderline personality disorder.  He states that he takes Topamax for weight loss.  He states that he was prescribed Abilify but he is not taking it.  He states that he has an ACT team through [facility].  States that he last met with them last week.  He states that his mother is his legal guardian.  When asked about suicidal ideations patient states "it is complicated."  When asked if he could contract for safety if discharged, he states "yes, I am not going to do anything."  He reports a history of a suicide attempt in 2016.  He states he jumped off of an overpass.  He reports a history of NSSIB by cutting.  He states he has not cut in several years.  He denies homicidal ideations.  He denies auditory and visual hallucinations.  No indication that he is responding to internal stimuli.  He denies paranoia.  On chart review it is noted that the patient has a delusion that he is a model and has to limit his weight.  Patient did discuss taking Topamax for weight loss during this assessment.  He denies use of alcohol, marijuana, and other substance use.  UDS negative.   TTS and provider contact the patient's legal guardian for collateral (Mother is his legal Vida Rigger [phone]).  Patient's mother reports that he has a history of schizophrenia and he gets frustrated at times if he feels like he is not getting enough attention.  She states he has been doing well mostly, but has been feeling anxious. She states that he was last prescribed Topamax and Abilify but she is not sure if he has been taking them.  She states that he woke up his aunt last night and told her that she did not care about him.  She states that she has twins that live with her and they do not feel comfortable with the patient living with them.  She states that she has been working with his ACT team with [facility] to try and find housing.  She reports that he has a history of multiple inpatient admissions.  She states that she will contact his ACT team first thing in the morning and make arrangements to pick up the patient if he is going to be discharged.  She requests that he stay over night at [facility].  PHQ  2-9:   [place] ED from 11/14/2020 in [facility] Most recent reading at 11/14/2020  2:10 AM ED from 11/11/2020 in [place] Most recent reading at 11/11/2020  7:49 PM ED from 11/11/2020 in [place] Most recent reading at 11/11/2020  2:18 PM  C-SSRS RISK CATEGORY Error: Q3, 4, or 5 should not be populated when Q2 is No Error: Q3, 4, or 5 should not be populated when Q2 is No Error: Q3, 4, or 5 should not be populated when Q2 is No        Total Time spent with patient: 30 minutes  Musculoskeletal  Strength & Muscle Tone: within normal limits Gait & Station: normal Patient leans: N/A  Psychiatric Specialty Exam  Presentation General Appearance: Appropriate for Environment; Neat  Eye Contact:Fair  Speech:Clear and Coherent; Normal Rate  Speech Volume:Decreased  Handedness:Right   Mood and Affect  Mood:Anxious  Affect:Constricted   Thought Process  Thought Processes:Coherent  Descriptions of Associations:Intact  Orientation:Full (Time, Place and Person)  Thought Content:WDL  Diagnosis of Schizophrenia or Schizoaffective disorder in past: Yes   Hallucinations:Hallucinations: None  Ideas of Reference:None  Suicidal Thoughts:Suicidal Thoughts: No  Homicidal Thoughts:Homicidal Thoughts: No   Sensorium  Memory:Immediate Fair; Recent Fair; Remote Fair  Judgment:Intact  Insight:Present   Executive Functions  Concentration:Fair  Attention Span:Fair  [place] of Knowledge:Fair  Language:Fair   Psychomotor Activity  Psychomotor Activity:Psychomotor Activity: Normal   Assets  Assets:Communication Skills; Desire for Improvement; Financial Resources/Insurance; Physical Health; Social Support   Sleep  Sleep:Sleep: Fair Number of Hours of Sleep: 7   Nutritional Assessment (For OBS and FBC admissions only) Has the patient had a weight  loss or gain of 10 pounds or more in the last 3 months?: No Has the patient had a decrease in food intake/or appetite?: Yes Does the patient have dental problems?: No Does the patient have eating habits or behaviors that may be indicators of an eating disorder including binging or inducing vomiting?: No Has the patient recently lost weight without trying?: No Has the patient been eating poorly because of a decreased appetite?: No Malnutrition Screening Tool Score: 0   Physical Exam Constitutional:      General: He is not in acute distress.    Appearance: He is not ill-appearing, toxic-appearing or diaphoretic.  Eyes:     Conjunctiva/sclera: Conjunctivae normal.     Pupils: Pupils are equal, round, and reactive to light.  Cardiovascular:     Rate and Rhythm: Normal rate.  Musculoskeletal:        General: Normal range of motion.  Neurological:     General: No focal deficit present.     Mental Status: He is alert and oriented to person, place, and time.  Psychiatric:        Behavior: Behavior is cooperative.        Thought Content: Thought content does not include homicidal or suicidal ideation.   Review of Systems  Constitutional:  Negative for chills, diaphoresis, fever, malaise/fatigue and weight loss.  Respiratory:  Negative for cough and shortness of breath.   Cardiovascular:  Negative for chest pain.  Gastrointestinal:  Negative for diarrhea, nausea and vomiting.  Neurological:  Negative for dizziness and seizures.  Psychiatric/Behavioral:  Positive for depression and suicidal ideas. Negative for hallucinations, memory loss and substance abuse. The patient is nervous/anxious and has insomnia.    Blood pressure 100/66, pulse 60, temperature 97.7 F (36.5 C), temperature source Temporal, resp. rate 16, SpO2 99 %. There is no height or weight on file to calculate BMI.  Past Psychiatric History: Paranoid schizophrenia, schizoaffective disorder, bipolar disorder, and borderline  personality disorder. Multiple inpatient admissions.   Is the patient at risk to self? Yes  Has the patient been a risk to self in the past 6 months? No .    Has the patient been a risk to self within the distant past? Yes   Is the patient a risk to others? No   Has the patient been a risk to others in the past 6 months? No   Has the patient been a risk to others within the distant past? No   Past Medical History:  Past Medical History:  Diagnosis Date  . ADD (attention deficit disorder)   . 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 Anemia   . Anxiety   . Bipolar disorder ([place])   . Borderline personality disorder ([place])   . Central auditory processing disorder   . Deliberate self-cutting   . Depressed   . Eczema   . Headache(784.0)   . Oppositional defiant disorder   . Schizophrenia [facility])     Past Surgical History:  Procedure Laterality Date  . BACK SURGERY    . FRACTURE SURGERY    . NO PAST SURGERIES    . POSTERIOR LUMBAR FUSION N/A 02/19/2015   Procedure: LATERAL L-2 CORPECTOMY;  Surgeon: [name], MD;  Location: [place];  Service: Neurosurgery;  Laterality: N/A;  . POSTERIOR LUMBAR FUSION 4 LEVEL  02/19/2015   Procedure: Posterior T-12 - L-4 STABILIZATION OF POSTERIOR LUMBAR;  Surgeon: [name], MD;  Location: [place];  Service: Neurosurgery;;  . TIBIA IM NAIL INSERTION Left 02/20/2015   Procedure: INTRAMEDULLARY (IM) NAIL LEFT TIBIAL;  Surgeon: Rod Can, MD;  Location: [place];  Service: Orthopedics;  Laterality: Left;    Family History:  Family History  Problem Relation Age of Onset  . Healthy Mother   . Healthy Father     Social History:  Social History   Socioeconomic History  . Marital status: Single    Spouse name: Not on file  . Number of children: Not on file  . Years of education: Not on file  . Highest education level: Not on file  Occupational History  . Occupation: Unemployed  Tobacco Use  .  Smoking status: Every Day    Packs/day: 2.00    Pack years: 0.00    Types: Cigarettes  . Smokeless tobacco: Never  . Tobacco comments:    1-3 packs   Vaping Use  . Vaping Use: Former  Substance and Sexual Activity  . Alcohol use: Not Currently    Alcohol/week: 2.0 standard drinks    Types: 2 Shots of liquor  per week  . Drug use: Yes    Types: Marijuana    Comment: heroin  . Sexual activity: Yes    Birth control/protection: None    Comment: pt reluctant to answer questions and frequently stated " I dont know"  Other Topics Concern  . Not on file  Social History Narrative   ** Merged History Encounter **    Pt reported that he is unemployed, homeless as his mother is in rehab   Social Determinants of Radio broadcast assistant Strain: Not on file  Food Insecurity: Not on file  Transportation Needs: Not on file  Physical Activity: Not on file  Stress: Not on file  Social Connections: Not on file  Intimate Partner Violence: Not on file    SDOH:  SDOH Screenings   Alcohol Screen: Not on file  Depression (PHQ2-9): Not on file  Financial Resource Strain: Not on file  Food Insecurity: Not on file  Housing: Not on file  Physical Activity: Not on file  Social Connections: Not on file  Stress: Not on file  Tobacco Use: High Risk  . Smoking Tobacco Use: Every Day  . Smokeless Tobacco Use: Never  Transportation Needs: Not on file    Last Labs:  Admission on 11/14/2020  Component Date Value Ref Range Status  . SARS Coronavirus 2 Ag 11/14/2020 Negative  Negative Preliminary  . POC Amphetamine UR 11/14/2020 None Detected  NONE DETECTED (Cut Off Level 1000 ng/mL) Final  . POC Secobarbital (BAR) 11/14/2020 None Detected  NONE DETECTED (Cut Off Level 300 ng/mL) Final  . POC Buprenorphine (BUP) 11/14/2020 None Detected  NONE DETECTED (Cut Off Level 10 ng/mL) Final  . POC Oxazepam (BZO) 11/14/2020 None Detected  NONE DETECTED (Cut Off Level 300 ng/mL) Final  . POC Cocaine UR  11/14/2020 None Detected  NONE DETECTED (Cut Off Level 300 ng/mL) Final  . POC Methamphetamine UR 11/14/2020 None Detected  NONE DETECTED (Cut Off Level 1000 ng/mL) Final  . POC Morphine 11/14/2020 None Detected  NONE DETECTED (Cut Off Level 300 ng/mL) Final  . POC Oxycodone UR 11/14/2020 None Detected  NONE DETECTED (Cut Off Level 100 ng/mL) Final  . POC Methadone UR 11/14/2020 None Detected  NONE DETECTED (Cut Off Level 300 ng/mL) Final  . POC Marijuana UR 11/14/2020 None Detected  NONE DETECTED (Cut Off Level 50 ng/mL) Final  . SARSCOV2ONAVIRUS 2 AG 11/14/2020 NEGATIVE  NEGATIVE Final   Comment: (NOTE) SARS-CoV-2 antigen NOT DETECTED.   Negative results are presumptive.  Negative results do not preclude SARS-CoV-2 infection and should not be used as the sole basis for treatment or other patient management decisions, including infection  control decisions, particularly in the presence of clinical signs and  symptoms consistent with COVID-19, or in those who have been in contact with the virus.  Negative results must be combined with clinical observations, patient history, and epidemiological information. The expected result is Negative.  Fact Sheet for Patients: HandmadeRecipes.com.cy  Fact Sheet for Healthcare Providers: FuneralLife.at  This test is not yet approved or cleared by the [place] FDA and  has been authorized for detection and/or diagnosis of SARS-CoV-2 by FDA under an Emergency Use Authorization (EUA).  This EUA will remain in effect (meaning this test can be used) for the duration of  the COV                          ID-19 declaration under Section 564(b)(1) of the  Act, 21 U.S.C. section 360bbb-3(b)(1), unless the authorization is terminated or revoked sooner.    Admission on 11/12/2020, Discharged on 11/12/2020  Component Date Value Ref Range Status  . Sodium 11/12/2020 138  135 - 145 mmol/L Final  . Potassium  11/12/2020 3.6  3.5 - 5.1 mmol/L Final  . Chloride 11/12/2020 106  98 - 111 mmol/L Final  . CO2 11/12/2020 24  22 - 32 mmol/L Final  . Glucose, Bld 11/12/2020 89  70 - 99 mg/dL Final   Glucose reference range applies only to samples taken after fasting for at least 8 hours.  . BUN 11/12/2020 9  6 - 20 mg/dL Final  . Creatinine, Ser 11/12/2020 0.84  0.61 - 1.24 mg/dL Final  . Calcium 11/12/2020 9.9  8.9 - 10.3 mg/dL Final  . GFR, Estimated 11/12/2020 >60  >60 mL/min Final   Comment: (NOTE) Calculated using the CKD-EPI Creatinine Equation (2021)   . Anion gap 11/12/2020 8  5 - 15 Final   Performed at [facility] [address] [place], [place] [address]  . Troponin I (High Sensitivity) 11/12/2020 <2  <18 ng/L Final   Comment: (NOTE) Elevated high sensitivity troponin I (hsTnI) values and significant  changes across serial measurements may suggest ACS but many other  chronic and acute conditions are known to elevate hsTnI results.  Refer to the "Links" section for chest pain algorithms and additional  guidance. Performed at [facility], [place] [address], [place], [place] [address]   . WBC 11/12/2020 4.8  4.0 - 10.5 K/uL Final  . RBC 11/12/2020 5.51  4.22 - 5.81 MIL/uL Final  . Hemoglobin 11/12/2020 15.1  13.0 - 17.0 g/dL Final  . HCT 11/12/2020 46.1  39.0 - 52.0 % Final  . MCV 11/12/2020 83.7  80.0 - 100.0 fL Final  . MCH 11/12/2020 27.4  26.0 - 34.0 pg Final  . MCHC 11/12/2020 32.8  30.0 - 36.0 g/dL Final  . RDW 11/12/2020 13.4  11.5 - 15.5 % Final  . Platelets 11/12/2020 230  150 - 400 K/uL Final  . nRBC 11/12/2020 0.0  0.0 - 0.2 % Final  . Neutrophils Relative % 11/12/2020 53  % Final  . Neutro Abs 11/12/2020 2.5  1.7 - 7.7 K/uL Final  . Lymphocytes Relative 11/12/2020 39  % Final  . Lymphs Abs 11/12/2020 1.9  0.7 - 4.0 K/uL Final  . Monocytes Relative 11/12/2020 7  % Final  . Monocytes Absolute 11/12/2020 0.3  0.1 - 1.0 K/uL Final  . Eosinophils Relative  11/12/2020 1  % Final  . Eosinophils Absolute 11/12/2020 0.0  0.0 - 0.5 K/uL Final  . Basophils Relative 11/12/2020 0  % Final  . Basophils Absolute 11/12/2020 0.0  0.0 - 0.1 K/uL Final  . Immature Granulocytes 11/12/2020 0  % Final  . Abs Immature Granulocytes 11/12/2020 0.01  0.00 - 0.07 K/uL Final   Performed at [facility], [place] [address] [place], [place] [address]  . Troponin I (High Sensitivity) 11/12/2020 3  <18 ng/L Final   Comment: (NOTE) Elevated high sensitivity troponin I (hsTnI) values and significant  changes across serial measurements may suggest ACS but many other  chronic and acute conditions are known to elevate hsTnI results.  Refer to the "Links" section for chest pain algorithms and additional  guidance. Performed at [facility], [place] [address] [place], [place] [address]   . D-Dimer, Quant 11/12/2020 0.37  0.00 - 0.50 ug/mL-FEU  Final   Comment: (NOTE) At the manufacturer cut-off value of 0.5 g/mL FEU, this assay has a negative predictive value of 95-100%.This assay is intended for use in conjunction with a clinical pretest probability (PTP) assessment model to exclude pulmonary embolism (PE) and deep venous thrombosis (DVT) in outpatients suspected of PE or DVT. Results should be correlated with clinical presentation. Performed at [facility], [place] [address], [place], [place] [address]   . TSH 11/12/2020 0.322 (A) 0.350 - 4.500 uIU/mL Final   Comment: Performed by a 3rd Generation assay with a functional sensitivity of <=0.01 uIU/mL. Performed at [facility], [place] [address] [place], [place] [address]   . Vitamin B-12 11/12/2020 665  180 - 914 pg/mL Final   Comment: (NOTE) This assay is not validated for testing neonatal or myeloproliferative syndrome specimens for Vitamin B12 levels. Performed at [facility], [place] [address] [place], [place] [address]   Admission on 11/11/2020, Discharged on 11/12/2020  Component Date  Value Ref Range Status  . Sodium 11/11/2020 136  135 - 145 mmol/L Final  . Potassium 11/11/2020 3.5  3.5 - 5.1 mmol/L Final  . Chloride 11/11/2020 104  98 - 111 mmol/L Final  . CO2 11/11/2020 24  22 - 32 mmol/L Final  . Glucose, Bld 11/11/2020 91  70 - 99 mg/dL Final   Glucose reference range applies only to samples taken after fasting for at least 8 hours.  . BUN 11/11/2020 11  6 - 20 mg/dL Final  . Creatinine, Ser 11/11/2020 0.79  0.61 - 1.24 mg/dL Final  . Calcium 11/11/2020 9.4  8.9 - 10.3 mg/dL Final  . GFR, Estimated 11/11/2020 >60  >60 mL/min Final   Comment: (NOTE) Calculated using the CKD-EPI Creatinine Equation (2021)   . Anion gap 11/11/2020 8  5 - 15 Final   Performed at [facility] [address] [place], [place] [address]  . WBC 11/11/2020 5.4  4.0 - 10.5 K/uL Final  . RBC 11/11/2020 5.39  4.22 - 5.81 MIL/uL Final  . Hemoglobin 11/11/2020 14.9  13.0 - 17.0 g/dL Final  . HCT 11/11/2020 45.5  39.0 - 52.0 % Final  . MCV 11/11/2020 84.4  80.0 - 100.0 fL Final  . MCH 11/11/2020 27.6  26.0 - 34.0 pg Final  . MCHC 11/11/2020 32.7  30.0 - 36.0 g/dL Final  . RDW 11/11/2020 13.6  11.5 - 15.5 % Final  . Platelets 11/11/2020 241  150 - 400 K/uL Final  . nRBC 11/11/2020 0.0  0.0 - 0.2 % Final  . Neutrophils Relative % 11/11/2020 41  % Final  . Neutro Abs 11/11/2020 2.2  1.7 - 7.7 K/uL Final  . Lymphocytes Relative 11/11/2020 51  % Final  . Lymphs Abs 11/11/2020 2.7  0.7 - 4.0 K/uL Final  . Monocytes Relative 11/11/2020 6  % Final  . Monocytes Absolute 11/11/2020 0.3  0.1 - 1.0 K/uL Final  . Eosinophils Relative 11/11/2020 2  % Final  . Eosinophils Absolute 11/11/2020 0.1  0.0 - 0.5 K/uL Final  . Basophils Relative 11/11/2020 0  % Final  . Basophils Absolute 11/11/2020 0.0  0.0 - 0.1 K/uL Final  . Immature Granulocytes 11/11/2020 0  % Final  . Abs Immature Granulocytes 11/11/2020 0.00  0.00 - 0.07 K/uL Final   Performed at [facility], [place] [address]  [place], [place] [address]  . Troponin I (High Sensitivity) 11/11/2020 2  <18 ng/L Final  Comment: (NOTE) Elevated high sensitivity troponin I (hsTnI) values and significant  changes across serial measurements may suggest ACS but many other  chronic and acute conditions are known to elevate hsTnI results.  Refer to the "Links" section for chest pain algorithms and additional  guidance. Performed at [facility], [place] [address] [place], [place] [address]   . Alcohol, Ethyl (B) 11/11/2020 <10  <10 mg/dL Final   Comment: (NOTE) Lowest detectable limit for serum alcohol is 10 mg/dL.  For medical purposes only. Performed at [facility], [place] [address] [place], [place] [address]   . Troponin I (High Sensitivity) 11/11/2020 3  <18 ng/L Final   Comment: (NOTE) Elevated high sensitivity troponin I (hsTnI) values and significant  changes across serial measurements may suggest ACS but many other  chronic and acute conditions are known to elevate hsTnI results.  Refer to the "Links" section for chest pain algorithms and additional  guidance. Performed at [facility], [place] [address] [place], [place] [address]   Admission on 11/11/2020, Discharged on 11/11/2020  Component Date Value Ref Range Status  . Opiates 11/11/2020 NONE DETECTED  NONE DETECTED Final  . Cocaine 11/11/2020 NONE DETECTED  NONE DETECTED Final  . Benzodiazepines 11/11/2020 NONE DETECTED  NONE DETECTED Final  . Amphetamines 11/11/2020 NONE DETECTED  NONE DETECTED Final  . Tetrahydrocannabinol 11/11/2020 NONE DETECTED  NONE DETECTED Final  . Barbiturates 11/11/2020 NONE DETECTED  NONE DETECTED Final   Comment: (NOTE) DRUG SCREEN FOR MEDICAL PURPOSES ONLY.  IF CONFIRMATION IS NEEDED FOR ANY PURPOSE, NOTIFY LAB WITHIN 5 DAYS.  LOWEST DETECTABLE LIMITS FOR URINE DRUG SCREEN Drug Class                     Cutoff (ng/mL) Amphetamine and metabolites    1000 Barbiturate and metabolites     200 Benzodiazepine                 564 Tricyclics and metabolites     300 Opiates and metabolites        300 Cocaine and metabolites        300 THC                            50 Performed at [facility], [place] [address] [place], [place] [address]   . Alcohol, Ethyl (B) 11/11/2020 <10  <10 mg/dL Final   Comment: (NOTE) Lowest detectable limit for serum alcohol is 10 mg/dL.  For medical purposes only. Performed at [facility], [place] [address] [place], [place] [address]   Admission on 11/10/2020, Discharged on 11/10/2020  Component Date Value Ref Range Status  . Sodium 11/10/2020 138  135 - 145 mmol/L Final  . Potassium 11/10/2020 3.4 (A) 3.5 - 5.1 mmol/L Final  . Chloride 11/10/2020 107  98 - 111 mmol/L Final  . CO2 11/10/2020 21 (A) 22 - 32 mmol/L Final  . Glucose, Bld 11/10/2020 98  70 - 99 mg/dL Final   Glucose reference range applies only to samples taken after fasting for at least 8 hours.  . BUN 11/10/2020 10  6 - 20 mg/dL Final  . Creatinine, Ser 11/10/2020 0.74  0.61 - 1.24 mg/dL Final  . Calcium 11/10/2020 9.4  8.9 - 10.3 mg/dL Final  . GFR, Estimated 11/10/2020 >60  >60 mL/min Final   Comment: (NOTE) Calculated using the CKD-EPI Creatinine Equation (2021)   .  Anion gap 11/10/2020 10  5 - 15 Final   Performed at [facility] [address], [place], [place] [address]  . WBC 11/10/2020 6.7  4.0 - 10.5 K/uL Final  . RBC 11/10/2020 5.29  4.22 - 5.81 MIL/uL Final  . Hemoglobin 11/10/2020 14.6  13.0 - 17.0 g/dL Final  . HCT 11/10/2020 44.4  39.0 - 52.0 % Final  . MCV 11/10/2020 83.9  80.0 - 100.0 fL Final  . MCH 11/10/2020 27.6  26.0 - 34.0 pg Final  . MCHC 11/10/2020 32.9  30.0 - 36.0 g/dL Final  . RDW 11/10/2020 13.6  11.5 - 15.5 % Final  . Platelets 11/10/2020 229  150 - 400 K/uL Final  . nRBC 11/10/2020 0.0  0.0 - 0.2 % Final   Performed at [facility], [place] [address], [place], [place] [address]  . Troponin I  (High Sensitivity) 11/10/2020 2  <18 ng/L Final   Comment: (NOTE) Elevated high sensitivity troponin I (hsTnI) values and significant  changes across serial measurements may suggest ACS but many other  chronic and acute conditions are known to elevate hsTnI results.  Refer to the "Links" section for chest pain algorithms and additional  guidance. Performed at [facility], [place] [address], [place], [place] [address]   . Opiates 11/10/2020 NONE DETECTED  NONE DETECTED Final  . Cocaine 11/10/2020 NONE DETECTED  NONE DETECTED Final  . Benzodiazepines 11/10/2020 NONE DETECTED  NONE DETECTED Final  . Amphetamines 11/10/2020 NONE DETECTED  NONE DETECTED Final  . Tetrahydrocannabinol 11/10/2020 NONE DETECTED  NONE DETECTED Final  . Barbiturates 11/10/2020 NONE DETECTED  NONE DETECTED Final   Comment: (NOTE) DRUG SCREEN FOR MEDICAL PURPOSES ONLY.  IF CONFIRMATION IS NEEDED FOR ANY PURPOSE, NOTIFY LAB WITHIN 5 DAYS.  LOWEST DETECTABLE LIMITS FOR URINE DRUG SCREEN Drug Class                     Cutoff (ng/mL) Amphetamine and metabolites    1000 Barbiturate and metabolites    200 Benzodiazepine                 364 Tricyclics and metabolites     300 Opiates and metabolites        300 Cocaine and metabolites        300 THC                            50 Performed at [facility], [place] [address] [place], [place] [address]   Admission on 11/10/2020, Discharged on 11/10/2020  Component Date Value Ref Range Status  . Glucose-Capillary 11/08/2020 270 (A) 70 - 99 mg/dL Final   Glucose reference range applies only to samples taken after fasting for at least 8 hours.  Admission on 11/08/2020, Discharged on 11/08/2020  Component Date Value Ref Range Status  . Sodium 11/08/2020 137  135 - 145 mmol/L Final  . Potassium 11/08/2020 3.7  3.5 - 5.1 mmol/L Final  . Chloride 11/08/2020 109  98 - 111 mmol/L Final  . CO2 11/08/2020 21 (A) 22 - 32 mmol/L Final  . Glucose, Bld 11/08/2020 88  70  - 99 mg/dL Final   Glucose reference range applies only to samples taken after fasting for at least 8 hours.  . BUN 11/08/2020 9  6 - 20 mg/dL Final  . Creatinine, Ser 11/08/2020 0.87  0.61 - 1.24 mg/dL Final  .  Calcium 11/08/2020 9.3  8.9 - 10.3 mg/dL Final  . GFR, Estimated 11/08/2020 >60  >60 mL/min Final   Comment: (NOTE) Calculated using the CKD-EPI Creatinine Equation (2021)   . Anion gap 11/08/2020 7  5 - 15 Final   Performed at [facility] [address], [place], [place] [address]  . Troponin I (High Sensitivity) 11/08/2020 <2  <18 ng/L Final   Comment: (NOTE) Elevated high sensitivity troponin I (hsTnI) values and significant  changes across serial measurements may suggest ACS but many other  chronic and acute conditions are known to elevate hsTnI results.  Refer to the "Links" section for chest pain algorithms and additional  guidance. Performed at [facility], [place] [address] [place], [place] [address]   . WBC 11/08/2020 5.4  4.0 - 10.5 K/uL Final  . RBC 11/08/2020 5.43  4.22 - 5.81 MIL/uL Final  . Hemoglobin 11/08/2020 14.9  13.0 - 17.0 g/dL Final  . HCT 11/08/2020 46.7  39.0 - 52.0 % Final  . MCV 11/08/2020 86.0  80.0 - 100.0 fL Final  . MCH 11/08/2020 27.4  26.0 - 34.0 pg Final  . MCHC 11/08/2020 31.9  30.0 - 36.0 g/dL Final  . RDW 11/08/2020 13.9  11.5 - 15.5 % Final  . Platelets 11/08/2020 219  150 - 400 K/uL Final  . nRBC 11/08/2020 0.0  0.0 - 0.2 % Final  . Neutrophils Relative % 11/08/2020 50  % Final  . Neutro Abs 11/08/2020 2.7  1.7 - 7.7 K/uL Final  . Lymphocytes Relative 11/08/2020 41  % Final  . Lymphs Abs 11/08/2020 2.2  0.7 - 4.0 K/uL Final  . Monocytes Relative 11/08/2020 7  % Final  . Monocytes Absolute 11/08/2020 0.4  0.1 - 1.0 K/uL Final  . Eosinophils Relative 11/08/2020 2  % Final  . Eosinophils Absolute 11/08/2020 0.1  0.0 - 0.5 K/uL Final  . Basophils Relative 11/08/2020 0  % Final  . Basophils Absolute 11/08/2020 0.0  0.0 -  0.1 K/uL Final  . Immature Granulocytes 11/08/2020 0  % Final  . Abs Immature Granulocytes 11/08/2020 0.01  0.00 - 0.07 K/uL Final   Performed at [facility], [place] [address] [place], [place] [address]  Admission on 08/05/2020, Discharged on 08/05/2020  Component Date Value Ref Range Status  . Streptococcus, Group A Screen (Dir* 08/05/2020 NEGATIVE  NEGATIVE Final  . Specimen Description 08/05/2020 THROAT   Final  . Special Requests 08/05/2020    Final                   Value:NONE Performed at [facility], [place] [address], [place], [place] [address]   . Culture 08/05/2020 FEW STREPTOCOCCUS,BETA HEMOLYTIC NOT GROUP A   Final  . Report Status 08/05/2020 08/07/2020 FINAL   Final  Admission on 06/28/2020, Discharged on 06/28/2020  Component Date Value Ref Range Status  . Glucose, UA 06/28/2020 NEGATIVE  NEGATIVE mg/dL Final  . Bilirubin Urine 06/28/2020 NEGATIVE  NEGATIVE Final  . Ketones, ur 06/28/2020 NEGATIVE  NEGATIVE mg/dL Final  . Specific Gravity, Urine 06/28/2020 1.020  1.005 - 1.030 Final  . Hgb urine dipstick 06/28/2020 NEGATIVE  NEGATIVE Final  . pH 06/28/2020 8.5 (A) 5.0 - 8.0 Final  . Protein, ur 06/28/2020 30 (A) NEGATIVE mg/dL Final  . Urobilinogen, UA 06/28/2020 0.2  0.0 - 1.0 mg/dL Final  . Nitrite 06/28/2020 NEGATIVE  NEGATIVE Final  . Chalmers Guest 06/28/2020 NEGATIVE  NEGATIVE Final  Biochemical Testing Only. Please order routine urinalysis from main lab if confirmatory testing is needed.  Admission on 06/26/2020, Discharged on 06/26/2020  Component Date Value Ref Range Status  . Chlamydia 06/26/2020 Negative   Final  . Neisseria Gonorrhea 06/26/2020 Positive (A)  Final  . Comment 06/26/2020 Normal Reference Ranger Chlamydia - Negative   Final  . Comment 06/26/2020 Normal Reference Range Neisseria Gonorrhea - Negative   Final  . Color, Urine 06/26/2020 YELLOW  YELLOW Final  . APPearance 06/26/2020 HAZY (A) CLEAR Final  . Specific Gravity, Urine  06/26/2020 1.019  1.005 - 1.030 Final  . pH 06/26/2020 7.0  5.0 - 8.0 Final  . Glucose, UA 06/26/2020 NEGATIVE  NEGATIVE mg/dL Final  . Hgb urine dipstick 06/26/2020 NEGATIVE  NEGATIVE Final  . Bilirubin Urine 06/26/2020 NEGATIVE  NEGATIVE Final  . Ketones, ur 06/26/2020 NEGATIVE  NEGATIVE mg/dL Final  . Protein, ur 06/26/2020 NEGATIVE  NEGATIVE mg/dL Final  . Nitrite 06/26/2020 NEGATIVE  NEGATIVE Final  . Chalmers Guest 06/26/2020 TRACE (A) NEGATIVE Final  . RBC / HPF 06/26/2020 0-5  0 - 5 RBC/hpf Final  . WBC, UA 06/26/2020 21-50  0 - 5 WBC/hpf Final  . Bacteria, UA 06/26/2020 FEW (A) NONE SEEN Final  . Squamous Epithelial / LPF 06/26/2020 0-5  0 - 5 Final  . Mucus 06/26/2020 PRESENT   Final  . Sperm, UA 06/26/2020 PRESENT   Final   Performed at [facility], [place] [address] [place], [place] [address]  There may be more visits with results that are not included.    Allergies: Clozapine, Prolixin [fluphenazine], and Risperdal [risperidone]  PTA Medications: (Not in a hospital admission)   Medical Decision Making  Admit to continuous assessment for crisis stabilization.  Medications will need to be verified with ACT team  Continue Topamax 50 mg p.o. 3 times daily    Clinical Course as of 11/14/20 0612  Sat Nov 11, 2020  1943 EKG 12-Lead Vent. rate 51 BPM PR interval 158 ms QRS duration 92 ms QT/QTcB 382/352 ms P-R-T axes 64 79 73 Sinus bradycardia Otherwise normal ECG Confirmed by Merrily Pew [phone]) on 11/12/2020 1:39:29 PM [JB]  Sun Nov 12, 2020  1659 EKG 12-Lead Vent. rate 50 BPM PR interval 162 ms QRS duration 90 ms QT/QTcB 382/348 ms P-R-T axes 57 85 68 Sinus bradycardia Otherwise normal ECG normal axis No acute ischemia Confirmed by Lorre Munroe ([id number]) on 11/12/2020 4:59:26 PM [JB]  Tue Nov 14, 2020  0231 POCT Urine Drug Screen - (ICup) UDS negative [JB]  0549 CBC with Differential/Platelet CBC unremarkable [JB]  0550 Alcohol, Ethyl (B): <10 [JB]   0601 TSH: 0.742 [JB]  0601 Hemoglobin A1C: 5.4 [JB]  0604 Comprehensive metabolic panel CMP unremarkable [JB]    Clinical Course User Index [JB] Rozetta Nunnery, NP    Recommendations  Based on my evaluation the patient does not appear to have an emergency medical condition.  Rozetta Nunnery, NP 11/14/20  3:08 AM

## 2020-11-14 NOTE — ED Notes (Signed)
Patient given cereal and milk for breakfast.
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
## 2020-11-14 NOTE — ED Notes (Signed)
Locker #[id number]  

## 2020-11-14 NOTE — ED Notes (Signed)
Patient irritable.  Impatient with discharge.  AVS reviewed with patient and patient verbalized understanding of information presented.  Patient ambulated off unit independently.  Discharged in stable condition; no acute distress noted.

## 2020-12-20 ENCOUNTER — Emergency Department (HOSPITAL_COMMUNITY)
Admission: EM | Admit: 2020-12-20 | Discharge: 2020-12-21 | Disposition: A | Discharge status: Home / Self Care | Payer: Medicaid Other | Attending: Emergency Medicine | Admitting: Emergency Medicine

## 2020-12-20 ENCOUNTER — Other Ambulatory Visit: Payer: Self-pay

## 2020-12-20 DIAGNOSIS — R0602 Shortness of breath: Secondary | ICD-10-CM | POA: Insufficient documentation

## 2020-12-20 DIAGNOSIS — M25512 Pain in left shoulder: Secondary | ICD-10-CM | POA: Insufficient documentation

## 2020-12-20 DIAGNOSIS — R002 Palpitations: Secondary | ICD-10-CM | POA: Insufficient documentation

## 2020-12-20 DIAGNOSIS — Z5321 Procedure and treatment not carried out due to patient leaving prior to being seen by health care provider: Secondary | ICD-10-CM | POA: Insufficient documentation

## 2020-12-20 DIAGNOSIS — R0789 Other chest pain: Secondary | ICD-10-CM | POA: Insufficient documentation

## 2020-12-20 DIAGNOSIS — R110 Nausea: Secondary | ICD-10-CM | POA: Insufficient documentation

## 2020-12-20 DIAGNOSIS — F1290 Cannabis use, unspecified, uncomplicated: Secondary | ICD-10-CM | POA: Diagnosis not present

## 2020-12-20 NOTE — ED Provider Notes (Signed)
Emergency Medicine Provider Triage Evaluation Note  [name] , a 25 y.o. male  was evaluated in triage.  Pt complains of chest tightness and palpitations which began after marijuana use. Hx of similar reactions to marijuana. Tightness radiates to the LUE. C/o some nausea and feeling anxious. No vomiting.  Review of Systems  Positive: Chest tightness, palpitations, nausea Negative: vomiting  Physical Exam  BP 136/67 (BP Location: Left Arm)   Pulse 68   Temp 98.4 F (36.9 C) (Oral)   Resp 18   Ht 5\' 9"  (1.753 m)   Wt 65.8 kg   SpO2 100%   BMI 21.42 kg/m  Gen:   Awake, no distress   Resp:  Normal effort  MSK:   Moves extremities without difficulty  Other:  Anxious appearing  Medical Decision Making  Medically screening exam initiated at 11:52 PM.  Appropriate orders placed.  [name] was informed that the remainder of the evaluation will be completed by another provider, this initial triage assessment does not replace that evaluation, and the importance of remaining in the ED until their evaluation is complete.  Palpitations, nonspecific chest pain   [name], [name] 12/20/20 2354    02/19/21, MD 12/21/20 [phone]

## 2020-12-20 NOTE — ED Triage Notes (Signed)
[place]ouyght in by [facility] from home   Pt took marijuana then had chest tightness, left shoulder pain radiating to left arm , palpitations and shortness of breath     Hx of panic attacks and anxiety.

## 2020-12-21 ENCOUNTER — Emergency Department (HOSPITAL_COMMUNITY): Payer: Medicaid Other

## 2020-12-21 NOTE — ED Notes (Signed)
Pt called for vitals, no response x3 

## 2021-04-02 ENCOUNTER — Other Ambulatory Visit: Payer: Self-pay

## 2021-04-02 ENCOUNTER — Emergency Department (HOSPITAL_BASED_OUTPATIENT_CLINIC_OR_DEPARTMENT_OTHER)
Admission: EM | Admit: 2021-04-02 | Discharge: 2021-04-02 | Disposition: A | Discharge status: Home / Self Care | Payer: Medicaid Other | Attending: Student | Admitting: Student

## 2021-04-02 ENCOUNTER — Encounter (HOSPITAL_BASED_OUTPATIENT_CLINIC_OR_DEPARTMENT_OTHER): Payer: Self-pay

## 2021-04-02 DIAGNOSIS — M79642 Pain in left hand: Secondary | ICD-10-CM | POA: Insufficient documentation

## 2021-04-02 DIAGNOSIS — Z5321 Procedure and treatment not carried out due to patient leaving prior to being seen by health care provider: Secondary | ICD-10-CM | POA: Insufficient documentation

## 2021-04-02 NOTE — ED Triage Notes (Signed)
Pt BIB EMS. States cut left hand on a piece of glass 3 weeks ago. States now swollen and has dark area around site. Denies fever.

## 2021-04-02 NOTE — ED Provider Notes (Signed)
Patient initially presenting with complaints of hand pain.  Patient eloped from the emergency department prior to my evaluation.   Glendora Score, MD 04/02/21 2147

## 2021-05-28 ENCOUNTER — Other Ambulatory Visit (HOSPITAL_BASED_OUTPATIENT_CLINIC_OR_DEPARTMENT_OTHER): Payer: Self-pay

## 2021-05-28 ENCOUNTER — Encounter (HOSPITAL_BASED_OUTPATIENT_CLINIC_OR_DEPARTMENT_OTHER): Payer: Self-pay | Admitting: *Deleted

## 2021-05-28 ENCOUNTER — Emergency Department (HOSPITAL_BASED_OUTPATIENT_CLINIC_OR_DEPARTMENT_OTHER)
Admission: EM | Admit: 2021-05-28 | Discharge: 2021-05-28 | Disposition: A | Discharge status: Home / Self Care | Payer: Medicaid Other | Attending: Emergency Medicine | Admitting: Emergency Medicine

## 2021-05-28 DIAGNOSIS — M545 Low back pain, unspecified: Secondary | ICD-10-CM

## 2021-05-28 MED ORDER — OXYCODONE HCL 5 MG PO TABS
5.0000 mg | ORAL_TABLET | Freq: Four times a day (QID) | ORAL | 0 refills | Status: DC | PRN
  Filled 2021-05-28: qty 15, 4d supply, fill #0

## 2021-05-28 MED ORDER — OXYCODONE HCL 5 MG PO TABS
5.0000 mg | ORAL_TABLET | Freq: Once | ORAL | Status: AC
  Administered 2021-05-28: 5 mg via ORAL
  Filled 2021-05-28: qty 1

## 2021-05-28 MED ORDER — IBUPROFEN 800 MG PO TABS
800.0000 mg | ORAL_TABLET | Freq: Three times a day (TID) | ORAL | 0 refills | Status: DC
  Filled 2021-05-28: qty 21, 7d supply, fill #0

## 2021-05-28 MED ORDER — CYCLOBENZAPRINE HCL 10 MG PO TABS
10.0000 mg | ORAL_TABLET | Freq: Two times a day (BID) | ORAL | 0 refills | Status: DC | PRN
  Filled 2021-05-28: qty 20, 10d supply, fill #0

## 2021-05-28 MED ORDER — IBUPROFEN 800 MG PO TABS
800.0000 mg | ORAL_TABLET | Freq: Once | ORAL | Status: AC
  Administered 2021-05-28: 800 mg via ORAL
  Filled 2021-05-28: qty 1

## 2021-05-28 NOTE — ED Triage Notes (Signed)
Had back surgery 2016, had back pain again approx 2 weeks ago. Was referred back to original surgeon team, here today for evaluation of return and worsening back pain.

## 2021-05-28 NOTE — ED Provider Notes (Signed)
[facility] EMERGENCY DEPARTMENT Provider Note   CSN: [id number] Arrival date & time: 05/28/21  [id number]     History  Chief Complaint  Patient presents with   Back Pain    [name] is a 26 y.o. male.  Patient with acute on chronic low back pain.  Has been having on and off back pain for the last several weeks.  Has always had some sort of back pain for the last several years since having back surgery back in 2016.  He had imaging done a few weeks ago that showed some mild loosening of hardware.  He has been referred back to spine surgeon for evaluation.  He denies any loss of bowel or bladder.  No weakness or numbness in his legs.  No fever.  The history is provided by the patient.  Back Pain Location:  Lumbar spine Quality:  Aching Pain severity:  Mild Onset quality:  Gradual Duration:  1 month Timing:  Intermittent Progression:  Waxing and waning Chronicity:  New Relieved by:  Nothing Worsened by:  Nothing Associated symptoms: no abdominal pain, no abdominal swelling, no bladder incontinence, no bowel incontinence, no chest pain, no dysuria, no fever, no headaches, no leg pain, no numbness, no paresthesias, no pelvic pain, no perianal numbness, no tingling, no weakness and no weight loss       Home Medications Prior to Admission medications   Medication Sig Start Date End Date Taking? Authorizing Provider  hydrOXYzine (ATARAX/VISTARIL) 25 MG tablet Take 1 tablet (25 mg total) by mouth 3 (three) times daily as needed for anxiety. 11/14/20   Karsten Rooda, Vandana, MD  topiramate (TOPAMAX) 50 MG tablet Take 50 mg by mouth in the morning, at noon, and at bedtime.    [provider]  benztropine (COGENTIN) 1 MG tablet Take 1 tablet (1 mg total) by mouth 2 (two) times daily. Patient not taking: Reported on 11/14/2020 05/04/19 11/14/20  Malvin JohnsFarah, Brian, MD  haloperidol decanoate (HALDOL DECANOATE) 50 MG/ML injection Inject 4 mLs (200 mg total) into the  muscle every 28 (twenty-eight) days. Due 05/29/2019 Patient not taking: Reported on 11/14/2020 05/04/19 11/14/20  Malvin JohnsFarah, Brian, MD  lithium carbonate (ESKALITH) 450 MG CR tablet Take 1 tablet (450 mg total) by mouth every 12 (twelve) hours. Patient not taking: Reported on 11/14/2020 05/04/19 11/14/20  Malvin JohnsFarah, Brian, MD  traZODone (DESYREL) 150 MG tablet Take 1 tablet (150 mg total) by mouth at bedtime. Patient not taking: Reported on 11/14/2020 03/09/19 11/14/20  Malvin JohnsFarah, Brian, MD      Allergies    Clozapine, Prolixin [fluphenazine], and Risperdal [risperidone]    Review of Systems   Review of Systems  Constitutional:  Negative for fever and weight loss.  Cardiovascular:  Negative for chest pain.  Gastrointestinal:  Negative for abdominal pain and bowel incontinence.  Genitourinary:  Negative for bladder incontinence, dysuria and pelvic pain.  Musculoskeletal:  Positive for back pain.  Neurological:  Negative for tingling, weakness, numbness, headaches and paresthesias.   Physical Exam Updated Vital Signs BP 116/68 (BP Location: Right Arm)    Pulse 98    Temp (!) 97.5 F (36.4 C) (Oral)    Resp 16    Ht 5\' 8"  (1.727 m)    Wt 68 kg    SpO2 100%    BMI 22.79 kg/m  Physical Exam Vitals and nursing note reviewed.  Constitutional:      General: He is not in acute distress.    Appearance: He is  well-developed. He is not ill-appearing.  HENT:     Head: Normocephalic and atraumatic.  Eyes:     Conjunctiva/sclera: Conjunctivae normal.  Cardiovascular:     Pulses: Normal pulses.  Pulmonary:     Effort: Pulmonary effort is normal. No respiratory distress.     Breath sounds: Normal breath sounds.  Abdominal:     Palpations: Abdomen is soft.     Tenderness: There is no abdominal tenderness.  Musculoskeletal:        General: Tenderness present. No swelling.     Cervical back: Neck supple.     Comments: Tenderness throughout the lower back  Skin:    General: Skin is warm and dry.     Capillary  Refill: Capillary refill takes less than 2 seconds.  Neurological:     General: No focal deficit present.     Mental Status: He is alert.     Sensory: No sensory deficit.     Motor: No weakness.  Psychiatric:        Mood and Affect: Mood normal.    ED Results / Procedures / Treatments   Labs (all labs ordered are listed, but only abnormal results are displayed) Labs Reviewed - No data to display  EKG None  Radiology No results found.  Procedures Procedures    Medications Ordered in ED Medications - No data to display  ED Course/ Medical Decision Making/ A&P                           Medical Decision Making  [name] is here with low back pain.  History of posterior lumbar fusion back in 2016 per chart review.  Vital signs per my interpretation are within normal limits.  No fever.  He has been having on and off back pain for the last several weeks but has always had some element of back pain for years.  Upon chart review he did have a CT scan of his low back about 2 weeks ago that showed signs suggestive of some mild hardware loosening.  He denies any new trauma.  Just having on and off back pain last several weeks if not longer.  He is not having any loss of bowel or bladder.  Not much difficulty with ambulation.  His exam is overall unremarkable.  He has some tenderness in his low back but has good strength and sensation in his lower extremities.  Good pulses in his lower extremities.  Differential diagnosis includes acute on chronic low back pain possibly secondary to some hardware issues now given outside CT image that was reviewed.  Have much lower suspicion for cauda equina, epidural abscess, new traumatic injury given history and physical.  Overall suspect acute on chronic pain/muscle spasm.  Patient given Roxicodone and Motrin with improvement.  Given his ongoing issues will prescribe a small course of narcotic pain medicine.  We will write  him also for ibuprofen and Flexeril.  He has information to follow-up with his spine surgeon as well as a new referral to a Careers adviser at [facility].  He understands return precautions.  Patient discharged in good condition.  This chart was dictated using voice recognition software.  Despite best efforts to proofread,  errors can occur which can change the documentation meaning.         Final Clinical Impression(s) / ED Diagnoses Final diagnoses:  None    Rx / DC Orders ED Discharge Orders  None         Virgina Norfolk, DO 05/28/21 0740

## 2021-05-28 NOTE — Discharge Instructions (Signed)
Follow-up with spine surgeon as discussed.  Recommend taking 800 mg ibuprofen every 8 hours as needed for pain.  Recommend taking 1000 mg of Tylenol every 6 hours as needed for pain.  Take oxycodone as prescribed for breakthrough pain only.  This is a narcotic pain medicine so please be careful when taking and do not do any dangerous activities including driving.  Do not mix it alcohol or drugs.  I have also prescribed you a muscle relaxant called cyclobenzaprine which is also mildly sedating and recommended not driving or doing dangerous activities while taking this medicine as well.

## 2021-05-28 NOTE — ED Notes (Signed)
ED Provider at bedside. 

## 2021-06-10 ENCOUNTER — Emergency Department: Admit: 2021-06-10 | Payer: Medicaid Other

## 2021-09-04 ENCOUNTER — Encounter (HOSPITAL_COMMUNITY): Payer: Self-pay | Admitting: Emergency Medicine

## 2021-09-04 ENCOUNTER — Ambulatory Visit (HOSPITAL_COMMUNITY)
Admission: EM | Admit: 2021-09-04 | Discharge: 2021-09-04 | Disposition: A | Discharge status: Home / Self Care | Payer: Self-pay | Attending: Physician Assistant | Admitting: Physician Assistant

## 2021-09-04 DIAGNOSIS — B360 Pityriasis versicolor: Secondary | ICD-10-CM

## 2021-09-04 MED ORDER — FLUCONAZOLE 150 MG PO TABS: 300.0000 mg | ORAL_TABLET | ORAL | 0 refills | Status: AC

## 2021-09-04 MED ORDER — KETOCONAZOLE 2 % EX CREA: 1.0000 "application " | TOPICAL_CREAM | Freq: Every day | CUTANEOUS | 0 refills | Status: DC

## 2021-09-04 NOTE — ED Triage Notes (Signed)
Pt is present today with a rash located on his face, arms, and back. Pt states that rash started  x2 days ago.  ?

## 2021-09-04 NOTE — ED Provider Notes (Signed)
[facility]    CSN: [id number] Arrival date & time: 09/04/21  1807      History   Chief Complaint Chief Complaint  Patient presents with   Rash    HPI [name] is a 26 y.o. male.   Pt presents with rash to face, arms, back and neck that started two days ago.  Reports he was diagnosed with tinea versicolor in January.  Used topical creams with resolution.  Today's sx are similar to previous rash.  He has tried nothing for the rash.  He reports itching and some hair loss at the base of his neck.    Past Medical History:  Diagnosis Date   ADD (attention deficit disorder)    ADHD (attention deficit hyperactivity disorder), 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   Anemia    Anxiety    Bipolar disorder (HCC)    Borderline personality disorder (HCC)    Central auditory processing disorder    Deliberate self-cutting    Depressed    Eczema    Headache(784.0)    Oppositional defiant disorder    Schizophrenia [facility])     Patient Active Problem List   Diagnosis Date Noted   Schizoaffective disorder, bipolar type (HCC) 09/16/2016   Eating disorder Unspecified 05/22/2016   Borderline personality disorder (HCC) 05/16/2016   Tobacco use disorder 05/15/2016    Past Surgical History:  Procedure Laterality Date   BACK SURGERY     FRACTURE SURGERY     NO PAST SURGERIES     POSTERIOR LUMBAR FUSION N/A 02/19/2015   Procedure: LATERAL L-2 CORPECTOMY;  Surgeon: Lisbeth Renshaw, MD;  Location: [facility] OR;  Service: Neurosurgery;  Laterality: N/A;   POSTERIOR LUMBAR FUSION 4 LEVEL  02/19/2015   Procedure: Posterior T-12 - L-4 STABILIZATION OF POSTERIOR LUMBAR;  Surgeon: Lisbeth Renshaw, MD;  Location: [facility] OR;  Service: Neurosurgery;;   TIBIA IM NAIL INSERTION Left 02/20/2015   Procedure: INTRAMEDULLARY (IM) NAIL LEFT TIBIAL;  Surgeon: [name], MD;  Location: [facility] OR;  Service: Orthopedics;  Laterality: Left;       Home Medications    Prior to Admission  medications   Medication Sig Start Date End Date Taking? Authorizing Provider  fluconazole (DIFLUCAN) 150 MG tablet Take 2 tablets (300 mg total) by mouth once a week for 2 doses. Take two tablets by mouth once weekly for two weeks. 09/04/21 09/12/21 Yes Ward, Tylene Fantasia, PA-C  ketoconazole (NIZORAL) 2 % cream Apply 1 application. topically daily. 09/04/21  Yes Ward, Tylene Fantasia, PA-C  cyclobenzaprine (FLEXERIL) 10 MG tablet Take 1 tablet (10 mg total) by mouth 2 (two) times daily as needed for muscle spasms. 05/28/21   Curatolo, Adam, DO  hydrOXYzine (ATARAX/VISTARIL) 25 MG tablet Take 1 tablet (25 mg total) by mouth 3 (three) times daily as needed for anxiety. 11/14/20   Karsten Ro, MD  ibuprofen (ADVIL) 800 MG tablet Take 1 tablet (800 mg total) by mouth 3 (three) times daily. 05/28/21   Curatolo, Adam, DO  oxyCODONE (ROXICODONE) 5 MG immediate release tablet Take 1 tablet (5 mg total) by mouth every 6 (six) hours as needed for up to 15 doses for breakthrough pain. 05/28/21   Curatolo, Adam, DO  topiramate (TOPAMAX) 50 MG tablet Take 50 mg by mouth in the morning, at noon, and at bedtime.    [provider]  benztropine (COGENTIN) 1 MG tablet Take 1 tablet (1 mg total) by mouth 2 (two) times daily. Patient not taking: Reported on 11/14/2020  05/04/19 11/14/20  Malvin Johns, MD  haloperidol decanoate (HALDOL DECANOATE) 50 MG/ML injection Inject 4 mLs (200 mg total) into the muscle every 28 (twenty-eight) days. Due 05/29/2019 Patient not taking: Reported on 11/14/2020 05/04/19 11/14/20  Malvin Johns, MD  lithium carbonate (ESKALITH) 450 MG CR tablet Take 1 tablet (450 mg total) by mouth every 12 (twelve) hours. Patient not taking: Reported on 11/14/2020 05/04/19 11/14/20  Malvin Johns, MD  traZODone (DESYREL) 150 MG tablet Take 1 tablet (150 mg total) by mouth at bedtime. Patient not taking: Reported on 11/14/2020 03/09/19 11/14/20  Malvin Johns, MD    Family History Family History  Problem Relation Age of Onset    Healthy Mother    Healthy Father     Social History Social History   Tobacco Use   Smoking status: Every Day    Packs/day: 2.00    Types: Cigarettes   Smokeless tobacco: Never   Tobacco comments:    1-3 packs   Vaping Use   Vaping Use: Former  Substance Use Topics   Alcohol use: Not Currently    Alcohol/week: 2.0 standard drinks    Types: 2 Shots of liquor per week   Drug use: Yes    Types: Marijuana    Comment: heroin     Allergies   Clozapine, Prolixin [fluphenazine], and Risperdal [risperidone]   Review of Systems Review of Systems  Constitutional:  Negative for chills and fever.  HENT:  Negative for ear pain and sore throat.   Eyes:  Negative for pain and visual disturbance.  Respiratory:  Negative for cough and shortness of breath.   Cardiovascular:  Negative for chest pain and palpitations.  Gastrointestinal:  Negative for abdominal pain and vomiting.  Genitourinary:  Negative for dysuria and hematuria.  Musculoskeletal:  Negative for arthralgias and back pain.  Skin:  Positive for rash. Negative for color change.  Neurological:  Negative for seizures and syncope.  All other systems reviewed and are negative.   Physical Exam Triage Vital Signs ED Triage Vitals [09/04/21 1846]  Enc Vitals Group     BP 100/63     Pulse Rate 70     Resp 18     Temp 98.7 F (37.1 C)     Temp Source Oral     SpO2 97 %     Weight      Height      Head Circumference      Peak Flow      Pain Score 0     Pain Loc      Pain Edu?      Excl. in GC?    No data found.  Updated Vital Signs BP 100/63   Pulse 70   Temp 98.7 F (37.1 C) (Oral)   Resp 18   SpO2 97%   Visual Acuity Right Eye Distance:   Left Eye Distance:   Bilateral Distance:    Right Eye Near:   Left Eye Near:    Bilateral Near:     Physical Exam Vitals and nursing note reviewed.  Constitutional:      General: He is not in acute distress.    Appearance: He is well-developed.  HENT:      Head: Normocephalic and atraumatic.  Eyes:     Conjunctiva/sclera: Conjunctivae normal.  Cardiovascular:     Rate and Rhythm: Normal rate and regular rhythm.     Heart sounds: No murmur heard. Pulmonary:     Effort: Pulmonary effort is normal. No  respiratory distress.     Breath sounds: Normal breath sounds.  Abdominal:     Palpations: Abdomen is soft.     Tenderness: There is no abdominal tenderness.  Musculoskeletal:        General: No swelling.     Cervical back: Neck supple.  Skin:    General: Skin is warm and dry.     Capillary Refill: Capillary refill takes less than 2 seconds.     Comments: Patches of discoloration to face, scaling patches to the neck and upper back.   Neurological:     Mental Status: He is alert.  Psychiatric:        Mood and Affect: Mood normal.     UC Treatments / Results  Labs (all labs ordered are listed, but only abnormal results are displayed) Labs Reviewed - No data to display  EKG   Radiology No results found.  Procedures Procedures (including critical care time)  Medications Ordered in UC Medications - No data to display  Initial Impression / Assessment and Plan / UC Course  I have reviewed the triage vital signs and the nursing notes.  Pertinent labs & imaging results that were available during my care of the patient were reviewed by me and considered in my medical decision making (see chart for details).     Tinea versicolor.  Will treat orally and provide topical cream.  Advised to follow up with dermatology if no improvement.  Final Clinical Impressions(s) / UC Diagnoses   Final diagnoses:  Tinea versicolor     Discharge Instructions      Take oral medication once weekly for two weeks Can apply the topical cream daily Follow up with dermatologist if no improvement    ED Prescriptions     Medication Sig Dispense Auth. Provider   fluconazole (DIFLUCAN) 150 MG tablet Take 2 tablets (300 mg total) by mouth once a  week for 2 doses. Take two tablets by mouth once weekly for two weeks. 4 tablet Ward, Shanda Bumps Z, PA-C   ketoconazole (NIZORAL) 2 % cream Apply 1 application. topically daily. 15 g Ward, Tylene Fantasia, PA-C      PDMP not reviewed this encounter.   Ward, Tylene Fantasia, PA-C 09/04/21 2003

## 2021-09-04 NOTE — Discharge Instructions (Signed)
Take oral medication once weekly for two weeks ?Can apply the topical cream daily ?Follow up with dermatologist if no improvement  ?

## 2021-09-21 ENCOUNTER — Ambulatory Visit (HOSPITAL_COMMUNITY)
Admission: EM | Admit: 2021-09-21 | Discharge: 2021-09-21 | Disposition: A | Discharge status: Home / Self Care | Payer: Medicaid Other | Attending: Psychiatry | Admitting: Psychiatry

## 2021-09-21 ENCOUNTER — Encounter (HOSPITAL_COMMUNITY): Payer: Self-pay | Admitting: Emergency Medicine

## 2021-09-21 ENCOUNTER — Other Ambulatory Visit: Payer: Self-pay

## 2021-09-21 ENCOUNTER — Emergency Department (HOSPITAL_COMMUNITY)
Admission: EM | Admit: 2021-09-21 | Discharge: 2021-09-21 | Discharge status: Against Medical Advice | Payer: Medicaid Other | Attending: Emergency Medicine | Admitting: Emergency Medicine

## 2021-09-21 DIAGNOSIS — F302 Manic episode, severe with psychotic symptoms: Secondary | ICD-10-CM | POA: Diagnosis present

## 2021-09-21 DIAGNOSIS — F209 Schizophrenia, unspecified: Secondary | ICD-10-CM | POA: Insufficient documentation

## 2021-09-21 DIAGNOSIS — Z9151 Personal history of suicidal behavior: Secondary | ICD-10-CM | POA: Insufficient documentation

## 2021-09-21 DIAGNOSIS — F319 Bipolar disorder, unspecified: Secondary | ICD-10-CM | POA: Insufficient documentation

## 2021-09-21 DIAGNOSIS — Z79899 Other long term (current) drug therapy: Secondary | ICD-10-CM

## 2021-09-21 DIAGNOSIS — F913 Oppositional defiant disorder: Secondary | ICD-10-CM | POA: Insufficient documentation

## 2021-09-21 DIAGNOSIS — F909 Attention-deficit hyperactivity disorder, unspecified type: Secondary | ICD-10-CM | POA: Insufficient documentation

## 2021-09-21 DIAGNOSIS — R45851 Suicidal ideations: Secondary | ICD-10-CM | POA: Insufficient documentation

## 2021-09-21 DIAGNOSIS — Z91148 Patient's other noncompliance with medication regimen for other reason: Secondary | ICD-10-CM | POA: Insufficient documentation

## 2021-09-21 DIAGNOSIS — Z8659 Personal history of other mental and behavioral disorders: Secondary | ICD-10-CM | POA: Insufficient documentation

## 2021-09-21 LAB — COMPREHENSIVE METABOLIC PANEL
ALT: 42 U/L (ref 0–44)
AST: 96 U/L — ABNORMAL HIGH (ref 15–41)
Albumin: 3.7 g/dL (ref 3.5–5.0)
Alkaline Phosphatase: 71 U/L (ref 38–126)
Anion gap: 6 (ref 5–15)
BUN: 8 mg/dL (ref 6–20)
CO2: 29 mmol/L (ref 22–32)
Calcium: 9.1 mg/dL (ref 8.9–10.3)
Chloride: 102 mmol/L (ref 98–111)
Creatinine, Ser: 0.76 mg/dL (ref 0.61–1.24)
GFR, Estimated: >60 mL/min (ref >60)
Glucose, Bld: 97 mg/dL (ref 70–99)
Potassium: 3.7 mmol/L (ref 3.5–5.1)
Sodium: 137 mmol/L (ref 135–145)
Total Bilirubin: 0.4 mg/dL (ref 0.3–1.2)
Total Protein: 7 g/dL (ref 6.5–8.1)

## 2021-09-21 LAB — RAPID URINE DRUG SCREEN, HOSP PERFORMED
Amphetamines: NOT DETECTED
Barbiturates: NOT DETECTED
Benzodiazepines: NOT DETECTED
Cocaine: NOT DETECTED
Opiates: NOT DETECTED
Tetrahydrocannabinol: NOT DETECTED

## 2021-09-21 LAB — CBC
HCT: 45.4 % (ref 39.0–52.0)
Hemoglobin: 14.2 g/dL (ref 13.0–17.0)
MCH: 26.5 pg (ref 26.0–34.0)
MCHC: 31.3 g/dL (ref 30.0–36.0)
MCV: 84.9 fL (ref 80.0–100.0)
Platelets: 294 10*3/uL (ref 150–400)
RBC: 5.35 MIL/uL (ref 4.22–5.81)
RDW: 13.6 % (ref 11.5–15.5)
WBC: 5.2 10*3/uL (ref 4.0–10.5)
nRBC: 0 % (ref 0.0–0.2)

## 2021-09-21 LAB — ETHANOL: Alcohol, Ethyl (B): <10 mg/dL (ref <10)

## 2021-09-21 MED ORDER — MAGNESIUM HYDROXIDE 400 MG/5ML PO SUSP: 30.0000 mL | Freq: Every day | ORAL | Status: DC | PRN

## 2021-09-21 MED ORDER — ACETAMINOPHEN 325 MG PO TABS: 650.0000 mg | ORAL_TABLET | Freq: Four times a day (QID) | ORAL | Status: DC | PRN

## 2021-09-21 MED ORDER — ALUM & MAG HYDROXIDE-SIMETH 200-200-20 MG/5ML PO SUSP: 30.0000 mL | ORAL | Status: DC | PRN

## 2021-09-21 NOTE — ED Triage Notes (Signed)
Presents for manic behavior d/t being out of medication x3 weeks. Takes latuda and gabapentin.  ?Has borderline personality disorder. ?Denies hallucinations, feeling paranoid, SI, HI.  ?Does endorse having trouble sleeping.  ? ?Despite pt stating he is manic, this RN notes excessively soft speech.  ? ?

## 2021-09-21 NOTE — ED Triage Notes (Signed)
Pt reports to [facility] voluntarily, accompanied by  [employer]. Pt reports that he called [employer] tonight because he felt like he has no purpose and life has no meaning. He stated feeling this way before and has had suicidal thoughts in the past and never acted on them, but currently has a plan to poison himself. Pt appeared to smile and laugh at inappropriate times while sharing this information. Pt has hx of schizoaffective disorder and borderline personality disorder. Pt is prescribed Latuda and Gabapentin, although he reports not being compliant at this time. He reports not having medication since last hospitalization. Pt denies HI, AVH and substance/alcohol use. Pt is urgent. ?

## 2021-09-21 NOTE — BH Assessment (Signed)
Comprehensive Clinical Assessment (CCA) Screening, Triage and Referral Note ? ?09/21/2021 ?Jamespaul Secrist [place] ?[id number] ? ?Disposition: Per Sindy Guadeloupe, NP, patient was recommended for inpatient treatment but decided to leave AMA.  ? ?Flowsheet Row ED from 09/21/2021 in [facility] ED from 09/04/2021 in [facility] ED from 05/28/2021 in [facility] EM[facility]GENCY [facility]PARTMENT  ?C-SSRS RISK CATEGORY No Risk No Risk No Risk  ? ?  ? ?The patient demonstrates the following risk factors for suicide: Chronic risk factors for suicide include: psychiatric disorder of Schizoaffective disorder, substance use disorder, previous suicide attempts x2, and previous self-harm history of cutting . Acute risk factors for suicide include: recent discharge from inpatient psychiatry. Protective factors for this patient include: hope for the future. Considering these factors, the overall suicide risk at this point appears to be low. Patient is not appropriate for outpatient follow up. ? ?Vanna Shavers is a 26 year old male presenting to [facility] with [employer]. Patient reports he was at his girlfriend house earlier today and after he left he called [employer] while he was at the store because he felt like he did not have a purpose anymore. Patient denies having argument with his girlfriend. Patient also reports he needed his medications of Latuda and gabapentin. Patient reports he has not had them since his last admission to the hospital at [facility] three weeks ago. Patient denies SI, HI, AVH and reports all I need is my medications. Patient denies recent drug use and reports drinking a pint of Ree Kida every other weekend. Patient reports he stopped receiving services with [facility] not too long ago and he is without an outpatient provider.  ? ?Patient oriented x4, engaged alert and cooperative. Patient smiles and laughs inappropriately during  assessment. Patient denies SI, HI, AVH and he reports a history of SIB. Patient contracts for safety. Patient signed waiver and left AMA after meeting with the provider. TTS attempted to notify guardian [name] [phone]) of patient arrival.    ? ?Chief Complaint:  ?Chief Complaint  ?Patient presents with  ? Suicidal  ? ?Visit Diagnosis:  ?Suicidal ideation  ?Noncompliance with medication regimen  ? ? ?Patient Reported Information ?How did you hear about [place]? Legal System ? ?What Is the Reason for Your Visit/Call Today? Pt reports to [facility] voluntarily, accompanied by  [employer]. Pt reports that he called [employer] tonight because he felt like he has no purpose and life has no meaning. He stated feeling this way before and has had suicidal thoughts in the past and never acted on them, but currently has a plan to poison himself. Pt appeared to smile and laugh at inappropriate times while sharing this information. Pt has hx of schizoaffective disorder and borderline personality disorder. Pt is prescribed Latuda and Gabapentin, although he reports not being compliant at this time. He reports not having medication since last hospitalization. Pt denies HI, AVH and substance/alcohol use. Pt is urgent. ? ?How Long Has This Been Causing You Problems? > than 6 months ? ?What Do You Feel Would Help You the Most Today? Treatment for Depression or other mood problem ? ? ?Have You Recently Had Any Thoughts About Hurting Yourself? Yes ? ?Are You Planning to Commit Suicide/Harm Yourself At This time? No ? ? ?Have you Recently Had Thoughts About Hurting Someone [name]? No ? ?Are You Planning to Harm Someone at This Time? No ? ?Explanation: No data recorded ? ?Have You Used Any Alcohol or  Drugs in the Past 24 Hours? No ? ?How Long Ago Did You Use Drugs or Alcohol? No data recorded ?What Did You Use and How Much? No data recorded ? ?Do You Currently Have a Therapist/Psychiatrist? No ? ?Name of Therapist/Psychiatrist: [facility] ? ? ?Have You  Been Recently Discharged From Any Office Practice or Programs? Yes ? ?Explanation of Discharge From Practice/Program: ACTT services ? ?  ?CCA Screening Triage Referral Assessment ?Type of Contact: Face-to-Face ? ?Telemedicine Service Delivery:   ?Is this Initial or Reassessment? No data recorded ?Date Telepsych consult ordered in [facility]:  No data recorded ?Time Telepsych consult ordered in [facility]:  No data recorded ?Location of Assessment: [facility] ? ?Provider Location: [facility] Services ? ? ?Collateral Involvement: [name], mother, [phone] ? ? ?Does Patient Have a Automotive engineer Guardian? No data recorded ?Name and Contact of Legal Guardian: No data recorded ?If Minor and Not Living with Parent(s), Who has Custody? No data recorded ?Is CPS involved or ever been involved? Never ? ?Is APS involved or ever been involved? Never ? ? ?Patient Determined To Be At Risk for Harm To Self or Others Based on Review of Patient Reported Information or Presenting Complaint? No ? ?Method: No data recorded ?Availability of Means: No data recorded ?Intent: No data recorded ?Notification Required: No data recorded ?Additional Information for Danger to Others Potential: No data recorded ?Additional Comments for Danger to Others Potential: No data recorded ?Are There Guns or Other Weapons in Your Home? No data recorded ?Types of Guns/Weapons: No data recorded ?Are These Weapons Safely Secured?                            No data recorded ?Who Could Verify You Are Able To Have These Secured: No data recorded ?Do You Have any Outstanding Charges, Pending Court Dates, Parole/Probation? No data recorded ?Contacted To Inform of Risk of Harm To Self or Others: Guardian/MH POA: ? ? ?Does Patient Present under Involuntary Commitment? No ? ?IVC Papers Initial File Date: No data recorded ? ?[place] of Residence: [place] ? ? ?Patient Currently Receiving the Following Services: Not Receiving  Services ? ? ?Determination of Need: Urgent (48 hours) ? ? ?Options For Referral: [facility]; Outpatient Therapy ? ? ?Discharge Disposition:  ?  ? ?Shayde Gervacio Shirlee More, [facility] ? ? ?  ?  ?  ? ? ?

## 2021-09-21 NOTE — ED Provider Notes (Signed)
[facility] Continuous Assessment Admission H&P ? ?Date: 09/21/21 ?Patient Name: [name] ?MRN: [id number] ?Chief Complaint:  ?Chief Complaint  ?Patient presents with  ? Suicidal  ?   ? ?Diagnoses:  ?Final diagnoses:  ?Suicidal ideation  ?Noncompliance with medication regimen  ? ? ?HPI: [name],  26 y.o male present to [facility] by [employer],  for suicidal ideation,  history of Bipolar disorder,  schizophrenia,  depression,  ODD,  ADHD.  ? ? ?Observation of patient,  he is alert and oriented x 4, speech is clear,  thought process illogical,  pt laugh inappropriately about just any thing when he is ask a question.  Pt did admit to suicide during triage about poisoning self, however when asked by NP patient state he is not suicidal. Therefore it not clear if the patient is thinking clearly or playing around. Pt denies HI,  AVH.  Pt state he is out of his medication and he normally gets them at the hospital.  Pt denies having a psychiatry or counselor.  Pt report he live alone,  don't talk with his family and he is unemployed.   ? ?Recommend inpatient observation  ? ?PHQ 2-9:   ?Flowsheet Row ED from 09/04/2021 in [facility] ED from 05/28/2021 in [place] ED from 04/02/2021 in [place]  ?C-SSRS RISK CATEGORY No Risk No Risk No Risk  ? ?  ?  ? ?Total Time spent with patient: 20 minutes ? ?Musculoskeletal  ?Strength & Muscle Tone: within normal limits ?Gait & Station: normal ?Patient leans: N/A ? ?Psychiatric Specialty Exam  ?Presentation ?General Appearance: Casual ? ?Eye Contact:Fair ? ?Speech:Clear and Coherent ? ?Speech Volume:Normal ? ?Handedness:Ambidextrous ? ? ?Mood and Affect  ?Mood:Anxious ? ?Affect:Inappropriate ? ? ?Thought Process  ?Thought Processes:Linear ? ?Descriptions of Associations:Loose ? ?Orientation:Full (Time, Place and Person) ? ?Thought Content:Illogical ? Diagnosis of  Schizophrenia or Schizoaffective disorder in past: Yes ?  ?Hallucinations:Hallucinations: None ? ?Ideas of Reference:None ? ?Suicidal Thoughts:Suicidal Thoughts: Yes, Active ?SI Active Intent and/or Plan: With Plan ? ?Homicidal Thoughts:Homicidal Thoughts: No ? ? ?Sensorium  ?Memory:Immediate Fair ? ?Judgment:Poor ? ?Insight:Poor ? ? ?Executive Functions  ?Concentration:Fair ? ?Attention Span:Fair ? ?Recall:Fair ? ?Campus ? ?Language:Fair ? ? ?Psychomotor Activity  ?Psychomotor Activity:Psychomotor Activity: Normal ? ? ?Assets  ?Assets:Desire for Improvement ? ? ?Sleep  ?Sleep:Sleep: Fair ? ? ?Nutritional Assessment (For OBS and FBC admissions only) ?Has the patient had a weight loss or gain of 10 pounds or more in the last 3 months?: No ?Has the patient had a decrease in food intake/or appetite?: No ?Does the patient have dental problems?: No ?Does the patient have eating habits or behaviors that may be indicators of an eating disorder including binging or inducing vomiting?: No ?Has the patient recently lost weight without trying?: 0 ?Has the patient been eating poorly because of a decreased appetite?: 0 ?Malnutrition Screening Tool Score: 0 ? ? ? ?Physical Exam ?HENT:  ?   Head: Normocephalic.  ?   Nose: Nose normal.  ?Cardiovascular:  ?   Rate and Rhythm: Normal rate.  ?Pulmonary:  ?   Effort: Pulmonary effort is normal.  ?Musculoskeletal:     ?   General: Normal range of motion.  ?   Cervical back: Normal range of motion.  ?Skin: ?   General: Skin is warm.  ?Neurological:  ?   General: No focal deficit present.  ?   Mental  Status: He is alert.  ?Psychiatric:     ?   Mood and Affect: Mood normal.     ?   Behavior: Behavior normal.     ?   Thought Content: Thought content normal.     ?   Judgment: Judgment normal.  ? ?Review of Systems  ?Constitutional: Negative.   ?HENT: Negative.    ?Eyes: Negative.   ?Respiratory: Negative.    ?Cardiovascular: Negative.   ?Gastrointestinal: Negative.    ?Genitourinary: Negative.   ?Musculoskeletal: Negative.   ?Skin: Negative.   ?Neurological: Negative.   ?Endo/Heme/Allergies: Negative.   ?Psychiatric/Behavioral:  Positive for suicidal ideas. The patient is nervous/anxious.   ? ?Blood pressure 118/71, pulse (!) 55, temperature 98.5 ?F (36.9 ?C), temperature source Oral, resp. rate 16, SpO2 100 %. There is no height or weight on file to calculate BMI. ? ?Past Psychiatric History: Schizophrenia,  Bipolar,  ADHD,  ODD  ? ?Is the patient at risk to self? Yes  ?Has the patient been a risk to self in the past 6 months? Yes .    ?Has the patient been a risk to self within the distant past? Yes   ?Is the patient a risk to others? No   ?Has the patient been a risk to others in the past 6 months? No   ?Has the patient been a risk to others within the distant past? No  ? ?Past Medical History:  ?Past Medical History:  ?Diagnosis Date  ? ADD (attention deficit disorder)   ? 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 Anemia   ? Anxiety   ? Bipolar disorder ([place])   ? Borderline personality disorder ([place])   ? Central auditory processing disorder   ? Deliberate self-cutting   ? Depressed   ? Eczema   ? Headache(784.0)   ? Oppositional defiant disorder   ? Schizophrenia ([place])   ?  ?Past Surgical History:  ?Procedure Laterality Date  ? BACK SURGERY    ? FRACTURE SURGERY    ? NO PAST SURGERIES    ? POSTERIOR LUMBAR FUSION N/A 02/19/2015  ? Procedure: LATERAL L-2 CORPECTOMY;  Surgeon: [name], MD;  Location: [place];  Service: Neurosurgery;  Laterality: N/A;  ? POSTERIOR LUMBAR FUSION 4 LEVEL  02/19/2015  ? Procedure: Posterior T-12 - L-4 STABILIZATION OF POSTERIOR LUMBAR;  Surgeon: [name], MD;  Location: [place];  Service: Neurosurgery;;  ? TIBIA IM NAIL INSERTION Left 02/20/2015  ? Procedure: INTRAMEDULLARY (IM) NAIL LEFT TIBIAL;  Surgeon: Rod Can, MD;  Location: [place];  Service: Orthopedics;  Laterality: Left;  ? ? ?Family  History:  ?Family History  ?Problem Relation Age of Onset  ? Healthy Mother   ? Healthy Father   ? ? ?Social History:  ?Social History  ? ?Socioeconomic History  ? Marital status: Single  ?  Spouse name: Not on file  ? Number of children: Not on file  ? Years of education: Not on file  ? Highest education level: Not on file  ?Occupational History  ? Occupation: Unemployed  ?Tobacco Use  ? Smoking status: Every Day  ?  Packs/day: 2.00  ?  Types: Cigarettes  ? Smokeless tobacco: Never  ? Tobacco comments:  ?  1-3 packs   ?Vaping Use  ? Vaping Use: Former  ?Substance and Sexual Activity  ? Alcohol use: Not Currently  ?  Alcohol/week: 2.0 standard drinks  ?  Types: 2 Shots of liquor per week  ? Drug  use: Yes  ?  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 Sexual activity: Yes  ?  Birth control/protection: None  ?  Comment: pt reluctant to answer questions and frequently stated " I dont know"  ?Other Topics Concern  ? Not on file  ?Social History Narrative  ? ** Merged History Encounter **  ?  Pt reported that he is unemployed, homeless as his mother is in rehab  ? ?Social Determinants of Health  ? ?Financial Resource Strain: Not on file  ?Food Insecurity: Not on file  ?Transportation Needs: Not on file  ?Physical Activity: Not on file  ?Stress: Not on file  ?Social Connections: Not on file  ?Intimate Partner Violence: Not on file  ? ? ?SDOH:  ?SDOH Screenings  ? ?Alcohol Screen: Not on file  ?Depression (PHQ2-9): Not on file  ?Financial Resource Strain: Not on file  ?Food Insecurity: Not on file  ?Housing: Not on file  ?Physical Activity: Not on file  ?Social Connections: Not on file  ?Stress: Not on file  ?Tobacco Use: High Risk  ? Smoking Tobacco Use: Every Day  ? Smokeless Tobacco Use: Never  ? Passive Exposure: Not on file  ?Transportation Needs: Not on file  ? ? ?Last Labs:  ?No visits with results within 6 Month(s) from this visit.  ?Latest known visit with results is:  ?Admission on 11/14/2020, Discharged on  11/14/2020  ?Component Date Value Ref Range Status  ? SARS Coronavirus 2 by RT PCR 11/14/2020 NEGATIVE  NEGATIVE Final  ? Comment: (NOTE) ?SARS-CoV-2 target nucleic acids are NOT DETECTED. ? ?The SARS-CoV-2 RNA is generally dete

## 2021-09-21 NOTE — ED Provider Notes (Signed)
?[facility] EMERGENCY DEPARTMENT ?Provider Note ? ? ?CSN: [id number] ?Arrival date & time: 09/21/21  0259 ? ?  ? ?History ? ?Chief Complaint  ?Patient presents with  ? Manic Behavior  ? ? ?Claudius Mich Tavita Eastham is a 26 y.o. male. ? ?The history is provided by the patient.  ?Illness ?Location:  Body ?Quality:  Would like an RX for latuda and gabapentin as he is out for 3 months but does not know the dose. ?Severity:  Mild ?Onset quality:  Gradual ?Duration:  3 months ?Timing:  Constant ?Progression:  Unchanged ?Chronicity:  Recurrent ?Context:  Does not take is medication as he should.  Denies SI or HI. ?Relieved by:  Nothing ?Worsened by:  Nothing ?Ineffective treatments:  None.  Just AMA from [facility].  States he does not want to talk to mental health just wants medication refilled but does not know dose. ?Associated symptoms: no diarrhea, no ear pain, no fever, no myalgias, no shortness of breath and no vomiting   ? ?  ? ?Home Medications ?Prior to Admission medications   ?Medication Sig Start Date End Date Taking? Authorizing Provider  ?cyclobenzaprine (FLEXERIL) 10 MG tablet Take 1 tablet (10 mg total) by mouth 2 (two) times daily as needed for muscle spasms. 05/28/21   Curatolo, Adam, DO  ?hydrOXYzine (ATARAX/VISTARIL) 25 MG tablet Take 1 tablet (25 mg total) by mouth 3 (three) times daily as needed for anxiety. 11/14/20   Karsten Ro, MD  ?ibuprofen (ADVIL) 800 MG tablet Take 1 tablet (800 mg total) by mouth 3 (three) times daily. 05/28/21   Curatolo, Adam, DO  ?ketoconazole (NIZORAL) 2 % cream Apply 1 application. topically daily. 09/04/21   Ward, Tylene Fantasia, PA-C  ?oxyCODONE (ROXICODONE) 5 MG immediate release tablet Take 1 tablet (5 mg total) by mouth every 6 (six) hours as needed for up to 15 doses for breakthrough pain. 05/28/21   Curatolo, Adam, DO  ?topiramate (TOPAMAX) 50 MG tablet Take 50 mg by mouth in the morning, at noon, and at bedtime.    [provider]   ?benztropine (COGENTIN) 1 MG tablet Take 1 tablet (1 mg total) by mouth 2 (two) times daily. ?Patient not taking: Reported on 11/14/2020 05/04/19 11/14/20  Malvin Johns, MD  ?haloperidol decanoate (HALDOL DECANOATE) 50 MG/ML injection Inject 4 mLs (200 mg total) into the muscle every 28 (twenty-eight) days. Due 05/29/2019 ?Patient not taking: Reported on 11/14/2020 05/04/19 11/14/20  Malvin Johns, MD  ?lithium carbonate (ESKALITH) 450 MG CR tablet Take 1 tablet (450 mg total) by mouth every 12 (twelve) hours. ?Patient not taking: Reported on 11/14/2020 05/04/19 11/14/20  Malvin Johns, MD  ?traZODone (DESYREL) 150 MG tablet Take 1 tablet (150 mg total) by mouth at bedtime. ?Patient not taking: Reported on 11/14/2020 03/09/19 11/14/20  Malvin Johns, MD  ?   ? ?Allergies    ?Clozapine, Prolixin [fluphenazine], and Risperdal [risperidone]   ? ?Review of Systems   ?Review of Systems  ?Constitutional:  Negative for fever.  ?HENT:  Negative for ear pain.   ?Eyes:  Negative for redness.  ?Respiratory:  Negative for shortness of breath.   ?Gastrointestinal:  Negative for diarrhea and vomiting.  ?Musculoskeletal:  Negative for myalgias.  ?Psychiatric/Behavioral:  Negative for agitation, hallucinations, self-injury, sleep disturbance and suicidal ideas. The patient is not nervous/anxious and is not hyperactive.   ?All other systems reviewed and are negative. ? ?Physical Exam ?Updated Vital Signs ?BP 117/78   Pulse (!) 55   Temp 97.9 ?F (  36.6 ?C) (Oral)   Resp 18   Ht 5\' 9"  (1.753 m)   Wt 68 kg   SpO2 99%   BMI 22.15 kg/m?  ?Physical Exam ?Vitals and nursing note reviewed.  ?Constitutional:   ?   General: He is not in acute distress. ?   Appearance: Normal appearance.  ?HENT:  ?   Head: Normocephalic and atraumatic.  ?   Nose: Nose normal.  ?Eyes:  ?   Conjunctiva/sclera: Conjunctivae normal.  ?   Pupils: Pupils are equal, round, and reactive to light.  ?Cardiovascular:  ?   Rate and Rhythm: Normal rate and regular rhythm.  ?   Pulses:  Normal pulses.  ?   Heart sounds: Normal heart sounds.  ?Pulmonary:  ?   Effort: Pulmonary effort is normal.  ?   Breath sounds: Normal breath sounds.  ?Abdominal:  ?   General: Abdomen is flat. Bowel sounds are normal.  ?   Palpations: Abdomen is soft.  ?   Tenderness: There is no abdominal tenderness.  ?Musculoskeletal:     ?   General: Normal range of motion.  ?   Cervical back: Normal range of motion and neck supple.  ?Skin: ?   General: Skin is warm and dry.  ?   Capillary Refill: Capillary refill takes less than 2 seconds.  ?Neurological:  ?   General: No focal deficit present.  ?   Mental Status: He is alert and oriented to person, place, and time.  ?   Deep Tendon Reflexes: Reflexes normal.  ?Psychiatric:     ?   Mood and Affect: Mood is not anxious or elated.     ?   Behavior: Behavior is not agitated or aggressive.     ?   Thought Content: Thought content does not include homicidal or suicidal ideation. Thought content does not include homicidal or suicidal plan.  ? ? ?ED Results / Procedures / Treatments   ?Labs ?(all labs ordered are listed, but only abnormal results are displayed) ?Labs Reviewed  ?COMPREHENSIVE METABOLIC PANEL - Abnormal; Notable for the following components:  ?    Result Value  ? AST 96 (*)   ? All other components within normal limits  ?RESP PANEL BY RT-PCR (FLU A&B, COVID) ARPGX2  ?ETHANOL  ?CBC  ?RAPID URINE DRUG SCREEN, HOSP PERFORMED  ? ? ?EKG ?None ? ?Radiology ?No results found. ? ?Procedures ?Procedures  ? ? ?Medications Ordered in ED ?Medications - No data to display ? ?ED Course/ Medical Decision Making/ A&P ?  ?                        ?Medical Decision Making ?Patient is here requesting refill on medication.  Denies SI or HI.   ? ?Amount and/or Complexity of Data Reviewed ?External Data Reviewed: notes. ?   Details: [facility] notes reviewed ?Labs: ordered. ?   Details: pending ? ?Risk ?Risk Details: Patient has eloped after exam.  Denies SI or HI.   ? ? ? ?Final Clinical  Impression(s) / ED Diagnoses ?Final diagnoses:  ?None  ? ? ?Rx / DC Orders ?ED Discharge Orders   ? ? None  ? ?  ? ? ?  ?Hagar Sadiq, MD ?09/21/21 0420 ? ?

## 2021-09-21 NOTE — ED Notes (Signed)
Pt eloped. Md aware ?

## 2021-09-21 NOTE — ED Notes (Signed)
pt was refusing to stay in overnight observation due to him not being able to receive his medication. I informed NP of pt wanting to be d/c he stated to have pt sign AMA form AMA was signed , pt received his belongings and was walked out  ?

## 2021-09-23 ENCOUNTER — Other Ambulatory Visit: Payer: Self-pay

## 2021-09-23 ENCOUNTER — Emergency Department (HOSPITAL_COMMUNITY)
Admission: EM | Admit: 2021-09-23 | Discharge: 2021-09-23 | Disposition: A | Discharge status: Home / Self Care | Payer: Medicaid Other | Attending: Emergency Medicine | Admitting: Emergency Medicine

## 2021-09-23 ENCOUNTER — Encounter (HOSPITAL_COMMUNITY): Payer: Self-pay | Admitting: Emergency Medicine

## 2021-09-23 DIAGNOSIS — M549 Dorsalgia, unspecified: Secondary | ICD-10-CM | POA: Insufficient documentation

## 2021-09-23 DIAGNOSIS — Z5321 Procedure and treatment not carried out due to patient leaving prior to being seen by health care provider: Secondary | ICD-10-CM | POA: Diagnosis not present

## 2021-09-23 NOTE — ED Triage Notes (Signed)
Pt c/o back pain x months, states he has hardware in his back that is coming loose. ?

## 2021-09-23 NOTE — ED Notes (Signed)
Called pt x3 for room, no response. 

## 2021-09-26 ENCOUNTER — Other Ambulatory Visit: Payer: Self-pay

## 2021-09-26 ENCOUNTER — Ambulatory Visit (HOSPITAL_COMMUNITY)
Admission: EM | Admit: 2021-09-26 | Discharge: 2021-09-26 | Disposition: A | Discharge status: Home / Self Care | Payer: Medicaid Other | Source: Home / Self Care

## 2021-09-26 ENCOUNTER — Encounter (HOSPITAL_COMMUNITY): Payer: Self-pay

## 2021-09-26 ENCOUNTER — Emergency Department (HOSPITAL_COMMUNITY)
Admission: EM | Admit: 2021-09-26 | Discharge: 2021-09-27 | Disposition: A | Discharge status: Home / Self Care | Payer: Medicaid Other | Source: Home / Self Care | Attending: Emergency Medicine | Admitting: Emergency Medicine

## 2021-09-26 ENCOUNTER — Emergency Department (HOSPITAL_COMMUNITY)
Admission: EM | Admit: 2021-09-26 | Discharge: 2021-09-26 | Disposition: A | Discharge status: Home / Self Care | Payer: Medicaid Other | Attending: Emergency Medicine | Admitting: Emergency Medicine

## 2021-09-26 ENCOUNTER — Emergency Department (HOSPITAL_COMMUNITY): Payer: Medicaid Other

## 2021-09-26 ENCOUNTER — Ambulatory Visit (HOSPITAL_COMMUNITY)
Admission: RE | Admit: 2021-09-26 | Discharge: 2021-09-26 | Disposition: A | Discharge status: Home / Self Care | Payer: Medicaid Other | Attending: Psychiatry | Admitting: Psychiatry

## 2021-09-26 ENCOUNTER — Encounter (HOSPITAL_COMMUNITY): Payer: Self-pay | Admitting: Emergency Medicine

## 2021-09-26 DIAGNOSIS — F603 Borderline personality disorder: Secondary | ICD-10-CM | POA: Insufficient documentation

## 2021-09-26 DIAGNOSIS — F250 Schizoaffective disorder, bipolar type: Secondary | ICD-10-CM | POA: Insufficient documentation

## 2021-09-26 DIAGNOSIS — F919 Conduct disorder, unspecified: Secondary | ICD-10-CM | POA: Insufficient documentation

## 2021-09-26 DIAGNOSIS — M545 Low back pain, unspecified: Secondary | ICD-10-CM

## 2021-09-26 DIAGNOSIS — R4689 Other symptoms and signs involving appearance and behavior: Secondary | ICD-10-CM

## 2021-09-26 DIAGNOSIS — M549 Dorsalgia, unspecified: Secondary | ICD-10-CM | POA: Diagnosis present

## 2021-09-26 DIAGNOSIS — F17210 Nicotine dependence, cigarettes, uncomplicated: Secondary | ICD-10-CM | POA: Insufficient documentation

## 2021-09-26 LAB — LIPID PANEL
Cholesterol: 197 mg/dL (ref 0–200)
HDL: 63 mg/dL (ref >40)
LDL Cholesterol: 125 mg/dL — ABNORMAL HIGH (ref 0–99)
Total CHOL/HDL Ratio: 3.1 RATIO
Triglycerides: 43 mg/dL (ref <150)
VLDL: 9 mg/dL (ref 0–40)

## 2021-09-26 LAB — RESP PANEL BY RT-PCR (FLU A&B, COVID) ARPGX2
Influenza A by PCR: NEGATIVE
Influenza B by PCR: NEGATIVE
SARS Coronavirus 2 by RT PCR: NEGATIVE

## 2021-09-26 LAB — TSH: TSH: 0.957 u[IU]/mL (ref 0.350–4.500)

## 2021-09-26 LAB — ETHANOL: Alcohol, Ethyl (B): <10 mg/dL (ref <10)

## 2021-09-26 LAB — HEMOGLOBIN A1C
Hgb A1c MFr Bld: 5.9 % — ABNORMAL HIGH (ref 4.8–5.6)
Mean Plasma Glucose: 122.63 mg/dL

## 2021-09-26 MED ORDER — OLANZAPINE 10 MG PO TBDP
10.0000 mg | ORAL_TABLET | Freq: Three times a day (TID) | ORAL | Status: DC | PRN
Start: 2021-09-26 — End: 2021-09-26

## 2021-09-26 MED ORDER — LORAZEPAM 1 MG PO TABS: 1.0000 mg | ORAL_TABLET | ORAL | Status: DC | PRN

## 2021-09-26 MED ORDER — HYDROXYZINE HCL 25 MG PO TABS: 25.0000 mg | ORAL_TABLET | Freq: Three times a day (TID) | ORAL | Status: DC | PRN

## 2021-09-26 MED ORDER — MAGNESIUM HYDROXIDE 400 MG/5ML PO SUSP: 30.0000 mL | Freq: Every day | ORAL | Status: DC | PRN

## 2021-09-26 MED ORDER — ACETAMINOPHEN 325 MG PO TABS: 650.0000 mg | ORAL_TABLET | Freq: Four times a day (QID) | ORAL | Status: DC | PRN

## 2021-09-26 MED ORDER — ZIPRASIDONE MESYLATE 20 MG IM SOLR: 20.0000 mg | INTRAMUSCULAR | Status: DC | PRN

## 2021-09-26 MED ORDER — ALUM & MAG HYDROXIDE-SIMETH 200-200-20 MG/5ML PO SUSP: 30.0000 mL | ORAL | Status: DC | PRN

## 2021-09-26 NOTE — ED Provider Notes (Signed)
?[place] DEPT ?Provider Note ? ? ?CSN: [id number] ?Arrival date & time: 09/26/21  [id number] ? ?  ? ?History ? ?Chief Complaint  ?Patient presents with  ? Back Pain  ? ? ?Kenward Quinata Josiahs Sekhon is a 26 y.o. male. ? ?HPI ? ?  ? ?This is a 26 year old with a history of schizoaffective disorder who presents with reported back pain.  He was brought in by EMS.  EMS reported back pain without injury or trauma.  On my evaluation, patient will not engage in history taking.  I ask multiple questions that he would not answer.  He did state that he had "hardware in my back that was coming out of place."  When asked if he was having new pain or symptoms, he stated "you figure it out."  I tried to engage the patient to be a part of his care and history taking.  However, patient repeatedly stated that "you will just have to figure it out." ? ?I have reviewed his chart.  He has had several presentations to both ED and behavioral health urgent care in the last 2 weeks.  He has a history of eloping prior to evaluation.  He has a legal guardian.  I attempted to contact his legal guardian but the phone line went directly to voicemail. ? ?Home Medications ?Prior to Admission medications   ?Medication Sig Start Date End Date Taking? Authorizing Provider  ?cyclobenzaprine (FLEXERIL) 10 MG tablet Take 1 tablet (10 mg total) by mouth 2 (two) times daily as needed for muscle spasms. 05/28/21   Curatolo, Adam, DO  ?hydrOXYzine (ATARAX/VISTARIL) 25 MG tablet Take 1 tablet (25 mg total) by mouth 3 (three) times daily as needed for anxiety. 11/14/20   Armando Reichert, MD  ?ibuprofen (ADVIL) 800 MG tablet Take 1 tablet (800 mg total) by mouth 3 (three) times daily. 05/28/21   Curatolo, Adam, DO  ?ketoconazole (NIZORAL) 2 % cream Apply 1 application. topically daily. 09/04/21   Ward, Lenise Arena, PA-C  ?oxyCODONE (ROXICODONE) 5 MG immediate release tablet Take 1 tablet (5 mg total) by mouth every 6 (six) hours as  needed for up to 15 doses for breakthrough pain. 05/28/21   Curatolo, Adam, DO  ?topiramate (TOPAMAX) 50 MG tablet Take 50 mg by mouth in the morning, at noon, and at bedtime.    [provider]  ?benztropine (COGENTIN) 1 MG tablet Take 1 tablet (1 mg total) by mouth 2 (two) times daily. ?Patient not taking: Reported on 11/14/2020 05/04/19 11/14/20  Johnn Hai, MD  ?haloperidol decanoate (HALDOL DECANOATE) 50 MG/ML injection Inject 4 mLs (200 mg total) into the muscle every 28 (twenty-eight) days. Due 05/29/2019 ?Patient not taking: Reported on 11/14/2020 05/04/19 11/14/20  Johnn Hai, MD  ?lithium carbonate (ESKALITH) 450 MG CR tablet Take 1 tablet (450 mg total) by mouth every 12 (twelve) hours. ?Patient not taking: Reported on 11/14/2020 05/04/19 11/14/20  Johnn Hai, MD  ?traZODone (DESYREL) 150 MG tablet Take 1 tablet (150 mg total) by mouth at bedtime. ?Patient not taking: Reported on 11/14/2020 03/09/19 11/14/20  Johnn Hai, MD  ?   ? ?Allergies    ?Clozapine, Prolixin [fluphenazine], and Risperdal [risperidone]   ? ?Review of Systems   ?Review of Systems  ?Reason unable to perform ROS: uncooperative.  ? ?Physical Exam ?Updated Vital Signs ?BP 123/74   Pulse 71   Temp 98.4 ?F (36.9 ?C) (Oral)   Resp 16   SpO2 100%  ?Physical Exam ?Vitals and nursing note  reviewed.  ?Constitutional:   ?   Appearance: He is well-developed. He is not ill-appearing.  ?   Comments: Actively searching and texting on his phone  ?HENT:  ?   Head: Normocephalic and atraumatic.  ?   Mouth/Throat:  ?   Mouth: Mucous membranes are moist.  ?Eyes:  ?   Pupils: Pupils are equal, round, and reactive to light.  ?Cardiovascular:  ?   Rate and Rhythm: Normal rate and regular rhythm.  ?Pulmonary:  ?   Effort: Pulmonary effort is normal. No respiratory distress.  ?Abdominal:  ?   Palpations: Abdomen is soft.  ?Musculoskeletal:  ?   Cervical back: Neck supple.  ?Lymphadenopathy:  ?   Cervical: No cervical adenopathy.  ?Skin: ?   General: Skin  is warm and dry.  ?Neurological:  ?   Mental Status: He is alert and oriented to person, place, and time.  ?Psychiatric:  ?   Comments: Bizarre affect, appears suspicious  ? ? ?ED Results / Procedures / Treatments   ?Labs ?(all labs ordered are listed, but only abnormal results are displayed) ?Labs Reviewed - No data to display ? ?EKG ?None ? ?Radiology ?No results found. ? ?Procedures ?Procedures  ? ? ?Medications Ordered in ED ?Medications - No data to display ? ?ED Course/ Medical Decision Making/ A&P ?Clinical Course as of 09/26/21 0259  ?Wed Sep 26, 2021  ?0254 Attempted contact with legal guardian.  Directly to voicemail. [CH]  ?  ?Clinical Course User Index ?[CH] Herndon Grill, Barbette Hair, MD  ? ?                        ?Medical Decision Making ? ?This patient presents to the ED for concern of back pain, this involves an extensive number of treatment options, and is a complaint that carries with it a high risk of complications and morbidity.  I considered the following differential and admission for this acute, potentially life threatening condition.  The differential diagnosis includes back strain, complication from prior hardware, psychiatric diagnosis ? ?MDM:   ? ?This is a 26 year old male who presents with reported back pain.  It is really unclear to me why he is truly presenting as he will not answer any directed questions.  He does report having back hardware that "is out of place."  He would not participate in history taking or physical exam.  He does not appear to any acute distress.  Vital signs were obtained and are within normal limits.  He is ambulatory independently.  I have attempted to contact his legal guardian but have been unsuccessful.  He does not appear acutely/unstably psychotic but his behavior and chart review does suggest that he may not be taking his psychiatric medications.  Patient was given the opportunity to engage in his care multiple times but refused.  For this reason, he was  discharged.  He is clinically stable and I have low suspicion for acute emergent process. ? ?(Labs, imaging, consults) ? ?Labs: ?I Ordered, and personally interpreted labs.  The pertinent results include: None ? ?Imaging Studies ordered: ?I ordered imaging studies including none ?I independently visualized and interpreted imaging. ?I agree with the radiologist interpretation ? ?Additional history obtained from review.  External records from outside source obtained and reviewed including evaluations at behavioral health urgent care and outside hospitals ? ?Cardiac Monitoring: ?The patient was maintained on a cardiac monitor.  I personally viewed and interpreted the cardiac monitored which showed an underlying  rhythm of: N/A ? ?Reevaluation: ?After the interventions noted above, I reevaluated the patient and found that they have :stayed the same ? ?Social Determinants of Health: ?History of schizoaffective disorder ? ?Disposition: Discharge ? ?Co morbidities that complicate the patient evaluation ? ?Past Medical History:  ?Diagnosis Date  ? ADD (attention deficit disorder)   ? 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 Anemia   ? Anxiety   ? Bipolar disorder ([place])   ? Borderline personality disorder ([place])   ? Central auditory processing disorder   ? Deliberate self-cutting   ? Depressed   ? Eczema   ? Headache(784.0)   ? Oppositional defiant disorder   ? Schizophrenia ([place])   ?  ? ?Medicines ?No orders of the defined types were placed in this encounter. ?  ?I have reviewed the patients home medicines and have made adjustments as needed ? ?Problem List / ED Course: ?Problem List Items Addressed This Visit   ?None ?Visit Diagnoses   ? ? Back pain, unspecified back location, unspecified back pain laterality, unspecified chronicity    -  Primary  ? Uncooperative behavior      ? ?  ?  ? ? ? ? ? ? ? ? ? ? ? ? ?Final Clinical Impression(s) / ED Diagnoses ?Final diagnoses:  ?Back pain,  unspecified back location, unspecified back pain laterality, unspecified chronicity  ?Uncooperative behavior  ? ? ?Rx / DC Orders ?ED Discharge Orders   ? ? None  ? ?  ? ? ?  ?Merryl Hacker, MD ?09/26/21 [phone] ? ?

## 2021-09-26 NOTE — ED Triage Notes (Signed)
Pt arrived via EMS c/o back pain, denies recent injury or trauma.  ?

## 2021-09-26 NOTE — Discharge Instructions (Signed)

## 2021-09-26 NOTE — ED Notes (Signed)
Pt refused EKG after staff put the prongs and pt took them off and stated he is not going to do EKG and stated " Y'all are not going to shoot me up with any medication". Pt got up from the table and walked out the room. ?

## 2021-09-26 NOTE — Progress Notes (Signed)
Pt's rapid Covid result was negative. PCR test was sent to [facility]. ?

## 2021-09-26 NOTE — ED Notes (Signed)
Pt is cursing at staff and pt on the unit. He is calling the doctor names. He I sating he will " fuck someone up". Will continue to monitor pt for safety on unit. ?

## 2021-09-26 NOTE — ED Provider Notes (Signed)
[facility] Continuous Assessment Admission H&P ? ?Date: 09/26/21 ?Patient Name: [name] ?MRN: [id number] ?Chief Complaint: No chief complaint on file. ?   ? ?Diagnoses:  ?Final diagnoses:  ?Schizoaffective disorder, bipolar type (HCC)  ? ? ?HPI: [name] is a 26 y.o. male with a history of schizoaffective disorder-bipolar type and borderline personality disorder, who presented to [facility] as a walk-in due to SI and HI. Patient was transferred to [facility] for continuous assessment.  Patient reports that he is homicidal towards people in general. Denies any homicidal intent or plans. He reports suicidal ideations with thoughts of carbon monoxide poisoning. States "but, I haven't looked into that enough to do it yet." Reports that he recently started using methamphetamines. Denies use of other substances. States that when he was at [facility] he was started on Jordan and gabapentin. He would like to restart these medications. Patient states that he was seeing Dr. Jeannine Kitten through [facility], but no longer has services through [facility].  ? ?On chart review, it is noted that the patient was admitted to [facility] 09/07/2021 with similar presentation. Patient also requested during that admission to restart latuda and gabapentin. Per notes, patient was started on Risperdal but refused to take any medicine other than gabapentin.  ? ?Patient has had multiple ED visits with requests to refill medications. History of multiple inpatient admissions.  ?  ?On evaluation, patient is alert and oriented x 4. He is cooperative. He is neatly groomed. Speech is clear and coherent. He laughs inappropriately at times. Patient noted to be talking to himself at times. He denies auditory and visual hallucinations. He denies paranoia. However, patient appears to be paranoid or suspicious of others. Reports SI with thoughts of using carbon monoxide poisoning. Reports HI  towards people in general. Denies any HI intent or plans.  ?  ? ?PHQ 2-9:   ?Flowsheet Row ED from 09/23/2021 in [facility] EMERGENCY DEPARTMENT ?Most recent reading at 09/23/2021  3:46 AM ED from 09/21/2021 in [facility] EMERGENCY DEPARTMENT ?Most recent reading at 09/21/2021  3:18 AM ED from 09/21/2021 in [facility] ?Most recent reading at 09/21/2021  2:34 AM  ?C-SSRS RISK CATEGORY No Risk No Risk No Risk  ? ?  ?  ? ?Total Time spent with patient: 15 minutes ? ?Musculoskeletal  ?Strength & Muscle Tone: within normal limits ?Gait & Station: normal ?Patient leans: N/A ? ?Psychiatric Specialty Exam  ?Presentation ?General Appearance: Appropriate for Environment; Casual; Neat ? ?Eye Contact:Fair ? ?Speech:Clear and Coherent; Normal Rate ? ?Speech Volume:Normal ? ?Handedness:Right ? ? ?Mood and Affect  ?Mood:Anxious; Depressed ? ?Affect:Inappropriate ? ? ?Thought Process  ?Thought Processes:Coherent ? ?Descriptions of Associations:Loose ? ?Orientation:Full (Time, Place and Person) ? ?Thought Content:Illogical ? Diagnosis of Schizophrenia or Schizoaffective disorder in past: Yes ?  ?Hallucinations:Hallucinations: None ? ?Ideas of Reference:None ? ?Suicidal Thoughts:Suicidal Thoughts: Yes, Active ?SI Active Intent and/or Plan: With Intent; With Plan ? ?Homicidal Thoughts:Homicidal Thoughts: Yes, Active ?HI Active Intent and/or Plan: Without Intent; Without Plan ? ? ?Sensorium  ?Memory:Immediate Good; Recent Fair; Remote Fair ? ?Judgment:Impaired ? ?Insight:Shallow ? ? ?Executive Functions  ?Concentration:Fair ? ?Attention Span:Fair ? ?Recall:Fair ? ?Fund of Knowledge:Fair ? ?Language:Fair ? ? ?Psychomotor Activity  ?Psychomotor Activity:Psychomotor Activity: Restlessness ? ? ?Assets  ?Assets:Desire for Improvement; Physical Health ? ? ?Sleep  ?Sleep:Sleep: Fair ? ? ?Nutritional Assessment (For OBS and FBC admissions only) ?Has the patient had a weight loss or  gain of 10 pounds or more in the last 3 months?: No ?Has the patient had a decrease in food intake/or appetite?: No ?Does the patient have dental problems?: No ?Does the patient have eating habits or behaviors that may be indicators of an eating disorder including binging or inducing vomiting?: No ?Has the patient recently lost weight without trying?: 0 ?Has the patient been eating poorly because of a decreased appetite?: 0 ?Malnutrition Screening Tool Score: 0 ? ? ? ?Physical Exam ?Constitutional:   ?   General: He is not in acute distress. ?   Appearance: He is not ill-appearing, toxic-appearing or diaphoretic.  ?HENT:  ?   Head: Normocephalic.  ?   Right Ear: External ear normal.  ?   Left Ear: External ear normal.  ?Eyes:  ?   Pupils: Pupils are equal, round, and reactive to light.  ?Cardiovascular:  ?   Rate and Rhythm: Normal rate.  ?Pulmonary:  ?   Effort: Pulmonary effort is normal. No respiratory distress.  ?Musculoskeletal:     ?   General: Normal range of motion.  ?Skin: ?   General: Skin is warm and dry.  ?Neurological:  ?   Mental Status: He is alert and oriented to person, place, and time.  ?Psychiatric:     ?   Mood and Affect: Mood is anxious and depressed.     ?   Speech: Speech normal.     ?   Behavior: Behavior is cooperative.     ?   Thought Content: Thought content includes homicidal and suicidal ideation. Thought content includes suicidal plan. Thought content does not include homicidal plan.  ? ?Review of Systems  ?Constitutional:  Negative for chills, diaphoresis, fever, malaise/fatigue and weight loss.  ?HENT:  Negative for congestion.   ?Respiratory:  Negative for cough and shortness of breath.   ?Cardiovascular:  Negative for chest pain and palpitations.  ?Gastrointestinal:  Negative for diarrhea, nausea and vomiting.  ?Musculoskeletal:  Positive for back pain.  ?Neurological:  Negative for dizziness and seizures.  ?Psychiatric/Behavioral:  Positive for depression, substance abuse and  suicidal ideas. Negative for hallucinations and memory loss. The patient is nervous/anxious and has insomnia.   ?All other systems reviewed and are negative. ? ?Blood pressure 123/83, pulse 61, temperature 98.1 ?F (36.7 ?C), temperature source Oral, resp. rate 18, SpO2 100 %. There is no height or weight on file to calculate BMI. ? ?Past Psychiatric History: schizoaffective disorder-bipolar type and borderline personality disorder ? ?Is the patient at risk to self? Yes  ?Has the patient been a risk to self in the past 6 months? Yes .    ?Has the patient been a risk to self within the distant past? Yes   ?Is the patient a risk to others? No   ?Has the patient been a risk to others in the past 6 months? No   ?Has the patient been a risk to others within the distant past? No  ? ?Past Medical History:  ?Past Medical History:  ?Diagnosis Date  ? ADD (attention deficit disorder)   ? 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 Anemia   ? Anxiety   ? Bipolar disorder (HCC)   ? Borderline personality disorder (HCC)   ? Central auditory processing disorder   ? Deliberate self-cutting   ? Depressed   ? Eczema   ? Headache(784.0)   ? Oppositional defiant disorder   ? Schizophrenia (HCC)   ?  ?Past Surgical History:  ?  Procedure Laterality Date  ? BACK SURGERY    ? FRACTURE SURGERY    ? NO PAST SURGERIES    ? POSTERIOR LUMBAR FUSION N/A 02/19/2015  ? Procedure: LATERAL L-2 CORPECTOMY;  Surgeon: Lisbeth Renshaw, MD;  Location: [facility] OR;  Service: Neurosurgery;  Laterality: N/A;  ? POSTERIOR LUMBAR FUSION 4 LEVEL  02/19/2015  ? Procedure: Posterior T-12 - L-4 STABILIZATION OF POSTERIOR LUMBAR;  Surgeon: Lisbeth Renshaw, MD;  Location: [facility] OR;  Service: Neurosurgery;;  ? TIBIA IM NAIL INSERTION Left 02/20/2015  ? Procedure: INTRAMEDULLARY (IM) NAIL LEFT TIBIAL;  Surgeon: [name], MD;  Location: [facility] OR;  Service: Orthopedics;  Laterality: Left;  ? ? ?Family History:  ?Family History  ?Problem  Relation Age of Onset  ? Healthy Mother   ? Healthy Father   ? ? ?Social History:  ?Social History  ? ?Socioeconomic History  ? Marital status: Single  ?  Spouse name: Not on file  ? Number of children: Not on fil

## 2021-09-26 NOTE — ED Provider Notes (Signed)
FBC/OBS ASAP Discharge Summary ? ?Date and Time: 09/26/2021 6:12 AM  ?Name: [name]  ?MRN:  [id number]  ? ?Discharge Diagnoses:  ?Final diagnoses:  ?Schizoaffective disorder, bipolar type (HCC)  ? ?[name] is a 26 y.o. male with a history of schizoaffective disorder-bipolar type and borderline personality disorder, who presented to [facility] as a walk-in due to SI and HI. Patient was transferred to [facility] for continuous assessment.  Patient reports that he is homicidal towards people in general. Denies any homicidal intent or plans. He reports suicidal ideations with thoughts of carbon monoxide poisoning. States "but, I haven't looked into that enough to do it yet." Reports that he recently started using methamphetamines. Denies use of other substances. States that when he was at [facility] he was started on Jordan and gabapentin. He would like to restart these medications. Patient states that he was seeing Dr. Jeannine Kitten through [facility], but no longer has services through [facility].  ?  ?On chart review, it is noted that the patient was admitted to [facility] 09/07/2021 with similar presentation. Patient also requested during that admission to restart latuda and gabapentin. Per notes, patient was started on Risperdal but refused to take any medicine other than gabapentin.  ?  ?Patient has had multiple ED visits with requests to refill medications. History of multiple inpatient admissions.  ?  ? ?Once patient was admitted to continuous assessment he started demanding xanax. He refused other medication options. Began yelling and cursing at staff. Requested discharge. Patient is not an imminent risk to self or others and does not meet IVC criteria.  ? ? ?Past Medical History:  ?Diagnosis Date  ? ADD (attention deficit disorder)   ? 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 Anemia   ? Anxiety   ? Bipolar disorder  (HCC)   ? Borderline personality disorder (HCC)   ? Central auditory processing disorder   ? Deliberate self-cutting   ? Depressed   ? Eczema   ? Headache(784.0)   ? Oppositional defiant disorder   ? Schizophrenia (HCC)   ?  ?Past Surgical History:  ?Procedure Laterality Date  ? BACK SURGERY    ? FRACTURE SURGERY    ? NO PAST SURGERIES    ? POSTERIOR LUMBAR FUSION N/A 02/19/2015  ? Procedure: LATERAL L-2 CORPECTOMY;  Surgeon: Lisbeth Renshaw, MD;  Location: [facility] OR;  Service: Neurosurgery;  Laterality: N/A;  ? POSTERIOR LUMBAR FUSION 4 LEVEL  02/19/2015  ? Procedure: Posterior T-12 - L-4 STABILIZATION OF POSTERIOR LUMBAR;  Surgeon: Lisbeth Renshaw, MD;  Location: [facility] OR;  Service: Neurosurgery;;  ? TIBIA IM NAIL INSERTION Left 02/20/2015  ? Procedure: INTRAMEDULLARY (IM) NAIL LEFT TIBIAL;  Surgeon: [name], MD;  Location: [facility] OR;  Service: Orthopedics;  Laterality: Left;  ? ?Family History:  ?Family History  ?Problem Relation Age of Onset  ? Healthy Mother   ? Healthy Father   ? ?Social History:  ?Social History  ? ?Substance and Sexual Activity  ?Alcohol Use Not Currently  ? Alcohol/week: 2.0 standard drinks  ? Types: 2 Shots of liquor per week  ?   ?Social History  ? ?Substance and Sexual Activity  ?Drug Use Yes  ? 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  ?Social History  ? ?Socioeconomic History  ? Marital status: Single  ?  Spouse name: Not on file  ? Number of children: Not on file  ? Years of education: Not on file  ?  Highest education level: Not on file  ?Occupational History  ? Occupation: Unemployed  ?Tobacco Use  ? Smoking status: Every Day  ?  Packs/day: 2.00  ?  Types: Cigarettes  ? Smokeless tobacco: Never  ? Tobacco comments:  ?  1-3 packs   ?Vaping Use  ? Vaping Use: Former  ?Substance and Sexual Activity  ? Alcohol use: Not Currently  ?  Alcohol/week: 2.0 standard drinks  ?  Types: 2 Shots of liquor per week  ? Drug use: Yes  ?  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 Sexual activity: Yes  ?   Birth control/protection: None  ?  Comment: pt reluctant to answer questions and frequently stated " I dont know"  ?Other Topics Concern  ? Not on file  ?Social History Narrative  ? ** Merged History Encounter **  ?  Pt reported that he is unemployed, homeless as his mother is in rehab  ? ?Social Determinants of Health  ? ?Financial Resource Strain: Not on file  ?Food Insecurity: Not on file  ?Transportation Needs: Not on file  ?Physical Activity: Not on file  ?Stress: Not on file  ?Social Connections: Not on file  ? ?SDOH:  ?SDOH Screenings  ? ?Alcohol Screen: Not on file  ?Depression (PHQ2-9): Not on file  ?Financial Resource Strain: Not on file  ?Food Insecurity: Not on file  ?Housing: Not on file  ?Physical Activity: Not on file  ?Social Connections: Not on file  ?Stress: Not on file  ?Tobacco Use: High Risk  ? Smoking Tobacco Use: Every Day  ? Smokeless Tobacco Use: Never  ? Passive Exposure: Not on file  ?Transportation Needs: Not on file  ? ? ?Tobacco Cessation:  N/A, patient does not currently use tobacco products ? ?Current Medications:  ?Current Facility-Administered Medications  ?Medication Dose Route Frequency Provider Last Rate Last Admin  ? acetaminophen (TYLENOL) tablet 650 mg  650 mg Oral Q6H PRN Jackelyn PolingBerry, Danila Eddie A, NP      ? alum & mag hydroxide-simeth (MAALOX/MYLANTA) 200-200-20 MG/5ML suspension 30 mL  30 mL Oral Q4H PRN Nira ConnBerry, Lesieli Bresee A, NP      ? hydrOXYzine (ATARAX) tablet 25 mg  25 mg Oral TID PRN Jackelyn PolingBerry, Oreta Soloway A, NP      ? OLANZapine zydis (ZYPREXA) disintegrating tablet 10 mg  10 mg Oral Q8H PRN Jackelyn PolingBerry, Diann Bangerter A, NP      ? And  ? LORazepam (ATIVAN) tablet 1 mg  1 mg Oral PRN Jackelyn PolingBerry, Rose-Marie Hickling A, NP      ? And  ? ziprasidone (GEODON) injection 20 mg  20 mg Intramuscular PRN Nira ConnBerry, Keturah Yerby A, NP      ? magnesium hydroxide (MILK OF MAGNESIA) suspension 30 mL  30 mL Oral Daily PRN Jackelyn PolingBerry, Aaryn Parrilla A, NP      ? ?Current Outpatient Medications  ?Medication Sig Dispense Refill  ? hydrOXYzine (ATARAX/VISTARIL) 25 MG  tablet Take 1 tablet (25 mg total) by mouth 3 (three) times daily as needed for anxiety. 30 tablet 0  ? ketoconazole (NIZORAL) 2 % cream Apply 1 application. topically daily. 15 g 0  ? topiramate (TOPAMAX) 50 MG tablet Take 50 mg by mouth in the morning, at noon, and at bedtime.    ? ? ?PTA Medications: (Not in a hospital admission) ? ? ?Musculoskeletal  ?Strength & Muscle Tone: within normal limits ?Gait & Station: normal ?Patient leans: N/A ? ?Psychiatric Specialty Exam  ?Presentation  ?General Appearance: Appropriate for Environment; Casual ? ?Eye Contact:Fair ? ?  Speech:Clear and Coherent; Normal Rate ? ?Speech Volume:Increased ? ?Handedness:Right ? ? ?Mood and Affect  ?Mood:Irritable ? ?Affect:Congruent ? ? ?Thought Process  ?Thought Processes:Coherent ? ?Descriptions of Associations:Loose ? ?Orientation:Full (Time, Place and Person) ? ?Thought Content:Illogical ? Diagnosis of Schizophrenia or Schizoaffective disorder in past: Yes ?  ? Hallucinations:Hallucinations: None ? ?Ideas of Reference:None ? ?Suicidal Thoughts:Suicidal Thoughts: No ?SI Active Intent and/or Plan: With Intent; With Plan ? ?Homicidal Thoughts:Homicidal Thoughts: No ?HI Active Intent and/or Plan: Without Intent; Without Plan ? ? ?Sensorium  ?Memory:Immediate Good ? ?Judgment:Intact ? ?Insight:Present ? ? ?Executive Functions  ?Concentration:Fair ? ?Attention Span:Fair ? ?Recall:Fair ? ?Fund of Knowledge:Fair ? ?Language:Fair ? ? ?Psychomotor Activity  ?Psychomotor Activity:Psychomotor Activity: Restlessness ? ? ?Assets  ?Assets:Desire for Improvement; Physical Health ? ? ?Sleep  ?Sleep:Sleep: Fair ? ? ?Nutritional Assessment (For OBS and FBC admissions only) ?Has the patient had a weight loss or gain of 10 pounds or more in the last 3 months?: No ?Has the patient had a decrease in food intake/or appetite?: No ?Does the patient have dental problems?: No ?Does the patient have eating habits or behaviors that may be indicators of an eating  disorder including binging or inducing vomiting?: No ?Has the patient recently lost weight without trying?: 0 ?Has the patient been eating poorly because of a decreased appetite?: 0 ?Malnutrition Screening Tool [place]

## 2021-09-26 NOTE — BH Assessment (Signed)
Comprehensive Clinical Assessment (CCA) Note ? ?09/26/2021 ?[name] ?[id number] ? ?Disposition: ?Lindon Romp, NP, recommends continuous observation at [facility].  ? ?The patient demonstrates the following risk factors for suicide: Chronic risk factors for suicide include: psychiatric disorder of Schizoaffective disorder, substance use disorder, previous suicide attempts x2, and previous self-harm history of cutting . Acute risk factors for suicide include: recent discharge from inpatient psychiatry. Protective factors for this patient include: hope for the future. Considering these factors, the overall suicide risk at this point appears to be low. Patient is not appropriate for outpatient follow up. ? ?[place] ED from 09/23/2021 in [place] ?Most recent reading at 09/23/2021  3:46 AM ED from 09/21/2021 in [place] ?Most recent reading at 09/21/2021  3:18 AM ED from 09/21/2021 in [facility] ?Most recent reading at 09/21/2021  2:34 AM  ?C-SSRS RISK CATEGORY No Risk No Risk No Risk  ? ?  ? ?[name] is a 26 y.o. male with a history of schizoaffective disorder-bipolar type and borderline personality disorder, who presented to [facility] as a walk-in due to [facility] with plan of carbon monoxide poisoning. Patient reported HI with no specific person identified. Patient reported he started using methamphetamines, no amounts reported. Patient was a patient at [facility] per chart. Patient denied receiving any outpatient mental health services. Patient denied being prescribed any psych medications.  ?  ?On chart review, it is noted that the patient was admitted to [facility] 09/07/2021 with similar presentation. Patient also requested during that admission to restart latuda and gabapentin. Per notes, patient was started on Risperdal but refused to take any  medicine other than gabapentin.  ?  ?Patient has had multiple ED visits with requests to refill medications. History of multiple inpatient admissions.  ?  ?Chief Complaint:  ?Chief Complaint  ?Patient presents with  ? Psychiatric Evaluation  ? ?Visit Diagnosis:  ?Schizoaffective disorder ? ?CCA Screening, Triage and Referral (STR) ? ?Patient Reported Information ?How did you hear about [place]? Self ? ?What Is the Reason for Your Visit/Call Today? HI ? ?How Long Has This Been Causing You Problems? 1 wk - 1 month ? ?What Do You Feel Would Help You the Most Today? Treatment for Depression or other mood problem ? ? ?Have You Recently Had Any Thoughts About Hurting Yourself? No ? ?Are You Planning to Commit Suicide/Harm Yourself At This time? No ? ? ?Have you Recently Had Thoughts About [place]? Yes ? ?Are You Planning to Harm Someone at This Time? No ? ?Explanation: No data recorded ? ?Have You Used Any Alcohol or Drugs in the Past 24 Hours? No ? ?How Long Ago Did You Use Drugs or Alcohol? No data recorded ?What Did You Use and How Much? No data recorded ? ?Do You Currently Have a Therapist/Psychiatrist? Yes ? ?Name of Therapist/Psychiatrist: [name] at [facility] ? ? ?Have You Been Recently Discharged From Any Office Practice or Programs? No ? ?Explanation of Discharge From Practice/Program: ACTT services ? ? ?  ?CCA Screening Triage Referral Assessment ?Type of Contact: Face-to-Face ? ?Telemedicine Service Delivery:   ?Is this Initial or Reassessment? No data recorded ?Date Telepsych consult ordered in CHL:  No data recorded ?Time Telepsych consult ordered in CHL:  No data recorded ?Location of Assessment: [facility] ? ?Provider Location: [facility] ? ? ?Collateral Involvement: [name], mother, [phone] ? ? ?Does Patient Have  a Stage manager Guardian? No data recorded ?Name and Contact of Legal Guardian: No data recorded ?If Minor and Not Living  with Parent(s), Who has Custody? No data recorded ?Is CPS involved or ever been involved? Never ? ?Is APS involved or ever been involved? Never ? ? ?Patient Determined To Be At Risk for Harm To Self or Others Based on Review of Patient Reported Information or Presenting Complaint? No ? ?Method: No data recorded ?Availability of Means: No data recorded ?Intent: No data recorded ?Notification Required: No data recorded ?Additional Information for Danger to Others Potential: No data recorded ?Additional Comments for Danger to Others Potential: No data recorded ?Are There Guns or Other Weapons in [place]? No data recorded ?Types of Guns/Weapons: No data recorded ?Are These Weapons Safely Secured?                            No data recorded ?Who Could Verify You Are Able To Have These Secured: No data recorded ?Do You Have any Outstanding Charges, Pending Court Dates, Parole/Probation? No data recorded ?Contacted To Inform of Risk of Harm To Self or Others: Guardian/MH POA: ? ? ? ?Does Patient Present under Involuntary Commitment? No ? ?IVC Papers Initial File Date: No data recorded ? ?[place] of Residence: Kathleen Argue ? ? ?Patient Currently Receiving the Following Services: Medication Management ? ? ?Determination of Need: Urgent (48 hours) ? ? ?Options For Referral: Medication Management; Facility-Based Crisis; Outpatient Therapy; Inpatient Hospitalization; Other: Comment ? ? ? ? ?CCA Biopsychosocial ?Patient Reported Schizophrenia/Schizoaffective Diagnosis in Past: Yes ? ? ?Strengths: Self-awareness ? ? ?Mental Health Symptoms ?Depression:   ?None ?  ?Duration of Depressive symptoms:    ?Mania:   ?None ?  ?Anxiety:    ?Fatigue; Worrying; Tension; Sleep ?  ?Psychosis:   ?None ?  ?Duration of Psychotic symptoms:    ?Trauma:   ?None ?  ?Obsessions:   ?None ?  ?Compulsions:   ?None ?  ?Inattention:   ?None ?  ?Hyperactivity/Impulsivity:   ?None ?  ?Oppositional/Defiant Behaviors:   ?None ?  ?Emotional Irregularity:    ?None ?  ?Other Mood/Personality Symptoms:  No data recorded  ? ?Mental Status Exam ?Appearance and self-care  ?Stature:   ?Average ?  ?Weight:   ?Average weight ?  ?Clothing:   ?Neat/clean ?  ?Grooming:   ?Normal ?  ?Cosmetic use:   ?None ?  ?Posture/gait:   ?Normal ?  ?Motor activity:   ?Not Remarkable ?  ?Sensorium  ?Attention:   ?Normal ?  ?Concentration:   ?Normal ?  ?Orientation:   ?X5 ?  ?Recall/memory:   ?Normal ?  ?Affect and Mood  ?Affect:   ?Appropriate; Anxious ?  ?Mood:   ?Anxious; Irritable ?  ?Relating  ?Eye contact:   ?Normal ?  ?Facial expression:   ?Responsive ?  ?Attitude toward examiner:   ?Cooperative ?  ?Thought and Language  ?Speech flow:  ?Slow; Soft ?  ?Thought content:   ?Appropriate to Mood and Circumstances ?  ?Preoccupation:   ?None ?  ?Hallucinations:   ?None ?  ?Organization:  No data recorded  ?Executive Functions  ?Fund of Knowledge:   ?Average ?  ?Intelligence:   ?Average ?  ?Abstraction:   ?Normal ?  ?Judgement:   ?Poor ?  ?Reality Testing:   ?-- Pincus Badder) ?  ?Insight:   ?Gaps ?  ?Decision Making:   ?Normal ?  ?Social Functioning  ?Social Maturity:  No data recorded  ?  Social Judgement:   ?Naive ?  ?Stress  ?Stressors:   ?Housing ?  ?Coping Ability:   ?Normal ?  ?Skill Deficits:   ?Decision making ?  ?Supports:   ?Family; Friends/Service system ?  ? ? ?Religion: ?Religion/Spirituality ?Are You A Religious Person?:  Pincus Badder) ? ?Leisure/Recreation: ?Leisure / Recreation ?Do You Have Hobbies?: Yes ? ?Exercise/Diet: ?Exercise/Diet ?Do You Exercise?:  (uta) ?Do You Follow a Special Diet?:  (uta) ?Do You Have Any Trouble Sleeping?: Yes ? ? ?CCA Employment/Education ?Employment/Work Situation: ?Employment / Work Situation ?Employment Situation: Unemployed ?Patient's Job has Been Impacted by Current Illness: No ?Has Patient ever Been in the Military?: No ? ?Education: ?Education ?Last Grade Completed: 11 ?Did You Attend College?: No ? ? ?CCA Family/Childhood History ?Family and Relationship  History: ?Family history ?Marital status: Single ?Does patient have children?: No ? ?Childhood History:  ?Childhood History ?By whom was/is the patient raised?: Mother, Grandparents ?Did patient suffer any verbal/

## 2021-09-26 NOTE — ED Provider Triage Note (Signed)
Emergency Medicine Provider Triage Evaluation Note  [name] , a 26 y.o. male  was evaluated in triage.  Pt complains of chronic lower back pain s/p surgery in 2016. Pt cannot recall what surgery he had done. He states they "messed it up" and it's "coming apart." When asked if he has followed up with his surgeon he asks me "Why would I follow up with someone who messed up my back?" Pt with hx of homelessness. Was at [facility] yesterday for same however became verbally aggressive with multiple staff members. Seen at [facility] today for unrelated matter however was also verbally aggressive with them.  Review of Systems  Positive: + back pain Negative: - weakness or numbness  Physical Exam  BP 122/75 (BP Location: Right Arm)   Pulse 62   Temp 98.7 F (37.1 C) (Oral)   Resp 18   SpO2 100%  Gen:   Awake, no distress   Resp:  Normal effort  MSK:   Moves extremities without difficulty  Other:    Medical Decision Making  Medically screening exam initiated at 11:21 PM.  Appropriate orders placed.  [name] was informed that the remainder of the evaluation will be completed by another provider, this initial triage assessment does not replace that evaluation, and the importance of remaining in the ED until their evaluation is complete.  Per chart review hx of L2 corpectomy and posterior T12-L1 stabilization in 2016.    Tanda Rockers, PA-C 09/26/21 2323

## 2021-09-26 NOTE — ED Notes (Signed)
Pt denies SI, HI, and AVH at present. Pt uncooperative and disrespectful to staff. Pt refused meds at present. Pt A&Ox4 with no complaints of pain. Will continue to monitor for safety. ?

## 2021-09-26 NOTE — Discharge Instructions (Signed)
You were seen today with complaints of back pain.  However, given that you would not participate in your own care or answer any questions, we were not able to do any further testing.  You are welcome to return at any time. ?

## 2021-09-26 NOTE — ED Notes (Signed)
Pt is constantly cursing even after being asked to stop. He said he does not have to stop cursing and he tired of sitting here  ?

## 2021-09-26 NOTE — H&P (Signed)
Behavioral Health Medical Screening Exam ? ?[name] is a 26 y.o. male who presents to [facility] as a walk-in due to [facility] and HI. Patient was seen at [facility] earlier and was discharged at [facility]. Patient reports that he is homicidal towards people in general. Denies any homicidal intent or plans. He reports suicidal ideations with thoughts of carbon monoxide poisoning. States "but, I haven't looked into that enough to do it yet." Reports that he recently started using methamphetamines. Denies use of other substances. States that when he was at [facility] he was started on [place] and gabapentin. He would like to restart these medications. Patient states that he was seeing [name] through [facility], but no longer has services through [facility].  ? ?On evaluation, patient is alert and oriented x 4. He is cooperative. He is neatly groomed. Speech is clear and coherent. He laughs inappropriately at times. Patient noted to be talking to himself at times. He denies auditory and visual hallucinations. He denies paranoia. Reports SI with thoughts of using carbon monoxide poisoning. Reports HI towards people in general. Denies any HI intent or plans.  ? ?Total Time spent with patient: 15 minutes ? ?Psychiatric Specialty Exam: ? ?Presentation  ?General Appearance: Appropriate for Environment; Casual ? ?Eye Contact:Fair ? ?Speech:Clear and Coherent; Normal Rate ? ?Speech Volume:Normal ? ?Handedness:Ambidextrous ? ? ?Mood and Affect  ?Mood:Anxious; Depressed ? ?Affect:Inappropriate ? ? ?Thought Process  ?Thought Processes:Coherent ? ?Descriptions of Associations:Loose ? ?Orientation:Full (Time, Place and Person) ? ?Thought Content:Illogical ? ?History of Schizophrenia/Schizoaffective disorder:Yes ? ?Duration of Psychotic Symptoms:No data recorded ?Hallucinations:Hallucinations: None ? ?Ideas of Reference:None ? ?Suicidal Thoughts:Suicidal Thoughts: Yes, Active ?SI Active Intent and/or Plan: With  Plan; With Intent ? ?Homicidal Thoughts:Homicidal Thoughts: Yes, Active ?HI Active Intent and/or Plan: Without Intent; Without Plan ? ? ?Sensorium  ?Memory:Recent Fair; Remote Fair; Immediate Good ? ?Judgment:Impaired ? ?Insight:Shallow ? ? ?Executive Functions  ?Concentration:Fair ? ?Attention Span:Fair ? ?Recall:Fair ? ?[place] ? ?Language:Fair ? ? ?Psychomotor Activity  ?Psychomotor Activity:Psychomotor Activity: Restlessness ? ? ?Assets  ?Assets:Desire for Improvement; Physical Health ? ? ?Sleep  ?Sleep:Sleep: Fair ? ? ? ?Physical Exam: ?Physical Exam ?Constitutional:   ?   General: He is not in acute distress. ?   Appearance: He is not ill-appearing, toxic-appearing or diaphoretic.  ?HENT:  ?   Right Ear: External ear normal.  ?   Left Ear: External ear normal.  ?Eyes:  ?   Pupils: Pupils are equal, round, and reactive to light.  ?Cardiovascular:  ?   Rate and Rhythm: Normal rate.  ?Pulmonary:  ?   Effort: Pulmonary effort is normal. No respiratory distress.  ?Musculoskeletal:     ?   General: Normal range of motion.  ?   Cervical back: Normal range of motion.  ?Neurological:  ?   Mental Status: He is alert and oriented to person, place, and time.  ?Psychiatric:     ?   Mood and Affect: Mood is anxious and depressed.     ?   Behavior: Behavior is cooperative.     ?   Thought Content: Thought content is not paranoid. Thought content includes homicidal and suicidal ideation. Thought content includes suicidal plan. Thought content does not include homicidal plan.  ? ?Review of Systems  ?Constitutional:  Negative for chills, diaphoresis, fever, malaise/fatigue and weight loss.  ?Cardiovascular:  Negative for chest pain and palpitations.  ?Gastrointestinal:  Negative for diarrhea, nausea and vomiting.  ?Musculoskeletal:  Positive for back  pain.  ?Neurological:  Negative for dizziness and seizures.  ?Psychiatric/Behavioral:  Positive for depression, substance abuse and suicidal ideas. Negative for  hallucinations and memory loss. The patient is nervous/anxious and has insomnia.   ? ?Blood pressure 112/72, pulse 97, temperature 98.3 ?F (36.8 ?C), resp. rate 16, SpO2 100 %. There is no height or weight on file to calculate BMI. ? ?Musculoskeletal: ?Strength & Muscle Tone: within normal limits ?Gait & Station: normal ?Patient leans: N/A ? ? ?Recommendations: ? ?Based on my evaluation the patient does not appear to have an emergency medical condition. ? ?Disposition: Transfer to [facility] for continuous assessment. Patient in agreement. Discussed patient with Evette Georges, NP who has agreed to accept the patient. ? ?Rozetta Nunnery, NP ?09/26/2021, 4:33 AM ? ?

## 2021-09-26 NOTE — ED Triage Notes (Signed)
Patient arrived with EMS from street( homeless)  reports chronic low back pain that worsened today , denies recent injury/ambulatory .  ?

## 2021-09-27 MED ORDER — OXYCODONE HCL 5 MG PO TABS: 5.0000 mg | ORAL_TABLET | ORAL | 0 refills | Status: DC | PRN

## 2021-09-27 MED ORDER — NAPROXEN 500 MG PO TABS: 500.0000 mg | ORAL_TABLET | Freq: Two times a day (BID) | ORAL | 0 refills | Status: DC

## 2021-09-27 NOTE — Discharge Instructions (Signed)
You were evaluated in the Emergency Department and after careful evaluation, we did not find any emergent condition requiring admission or further testing in the hospital.  Your exam/testing today is overall reassuring.  Pain may be related to a loose hardware screw.  Important that you follow-up with Dr. Conchita Paris in the office, call the number provided.  Can use the Naprosyn twice daily for pain.  Can use the oxycodone for more significant pain.  Please return to the Emergency Department if you experience any worsening of your condition.   Thank you for allowing [place] to be a part of your care.

## 2021-09-27 NOTE — ED Provider Notes (Signed)
[facility] Emergency Department Provider Note MRN:  [id number]  Arrival date & time: 09/27/21     Chief Complaint   Back Pain   History of Present Illness   [name] is a 26 y.o. year-old male with a history of L2 burst fracture with surgical repair and hardware in place presenting to the ED with chief complaint of back pain.  Worsening lower back pain over the past several weeks.  No numbness or weakness to the arms or legs, no bowel or bladder dysfunction, pain is mild, no other complaints.  Review of Systems  A thorough review of systems was obtained and all systems are negative except as noted in the HPI and PMH.   Patient's Health History    Past Medical History:  Diagnosis Date   ADD (attention deficit disorder)    ADHD (attention deficit hyperactivity disorder), 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   Anemia    Anxiety    Bipolar disorder ([place])    Borderline personality disorder ([place])    Central auditory processing disorder    Deliberate self-cutting    Depressed    Eczema    Headache(784.0)    Oppositional defiant disorder    Schizophrenia ([place])     Past Surgical History:  Procedure Laterality Date   BACK SURGERY     FRACTURE SURGERY     NO PAST SURGERIES     POSTERIOR LUMBAR FUSION N/A 02/19/2015   Procedure: LATERAL L-2 CORPECTOMY;  Surgeon: [name], MD;  Location: [place];  Service: Neurosurgery;  Laterality: N/A;   POSTERIOR LUMBAR FUSION 4 LEVEL  02/19/2015   Procedure: Posterior T-12 - L-4 STABILIZATION OF POSTERIOR LUMBAR;  Surgeon: [name], MD;  Location: [place];  Service: Neurosurgery;;   TIBIA IM NAIL INSERTION Left 02/20/2015   Procedure: INTRAMEDULLARY (IM) NAIL LEFT TIBIAL;  Surgeon: Rod Can, MD;  Location: [place];  Service: Orthopedics;  Laterality: Left;    Family History  Problem Relation Age of Onset   Healthy Mother    Healthy Father     Social History   Socioeconomic History    Marital status: Single    Spouse name: Not on file   Number of children: Not on file   Years of education: Not on file   Highest education level: Not on file  Occupational History   Occupation: Unemployed  Tobacco Use   Smoking status: Every Day    Packs/day: 2.00    Types: Cigarettes   Smokeless tobacco: Never   Tobacco comments:    1-3 packs   Vaping Use   Vaping Use: Former  Substance and Sexual Activity   Alcohol use: Not Currently    Alcohol/week: 2.0 standard drinks    Types: 2 Shots of liquor per week   Drug use: Yes    Types: Marijuana    Comment: heroin   Sexual activity: Yes    Birth control/protection: None    Comment: pt reluctant to answer questions and frequently stated " I dont know"  Other Topics Concern   Not on file  Social History Narrative   ** Merged History Encounter **    Pt reported that he is unemployed, homeless as his mother is in rehab   Social Determinants of Radio broadcast assistant Strain: Not on file  Food Insecurity: Not on file  Transportation Needs: Not on file  Physical Activity: Not on file  Stress: Not on file  Social Connections: Not on file  Intimate  Partner Violence: Not on file     Physical Exam   Vitals:   09/26/21 2315  BP: 122/75  Pulse: 62  Resp: 18  Temp: 98.7 F (37.1 C)  SpO2: 100%    CONSTITUTIONAL: Well-appearing, NAD NEURO/PSYCH:  Alert and oriented x 3, no focal deficits EYES:  eyes equal and reactive ENT/NECK:  no LAD, no JVD CARDIO: Regular rate, well-perfused, normal S1 and S2 PULM:  CTAB no wheezing or rhonchi GI/GU:  non-distended, non-tender MSK/SPINE:  No gross deformities, no edema SKIN:  no rash, atraumatic   *Additional and/or pertinent findings included in MDM below  Diagnostic and Interventional Summary    EKG Interpretation  Date/Time:    Ventricular Rate:    PR Interval:    QRS Duration:   QT Interval:    QTC Calculation:   R Axis:     Text Interpretation:          Labs Reviewed - No data to display  DG Lumbar Spine Complete  Final Result      Medications - No data to display   Procedures  /  Critical Care Procedures  ED Course and Medical Decision Making  Initial Impression and Ddx Chronic back pain, history of extensive hardware from trauma years ago.  Screening plain film showing a possible loose pedicle screw at L4.  Patient has a reassuring neurological exam without any signs or symptoms of myelopathy.  Does not seem to need any emergent intervention, will refer back to his neurosurgeon.  Past medical/surgical history that increases complexity of ED encounter: History of unstable L2 burst fracture status post corpectomy and hardware placement  Interpretation of Diagnostics I personally reviewed the lumbar x-ray and my interpretation is as follows: Hardware in place     Patient Reassessment and Ultimate Disposition/Management Discharge  Patient management required discussion with the following services or consulting groups:  None  Complexity of Problems Addressed Acute illness or injury that poses threat of life of bodily function  Additional Data Reviewed and Analyzed Further history obtained from: Recent Consult notes and Prior labs/imaging results  Additional Factors Impacting ED Encounter Risk Prescriptions  [name], [place] [email or web address]  Final Clinical Impressions(s) / ED Diagnoses     ICD-10-CM   1. Acute midline low back pain without sciatica  M54.50       ED Discharge Orders          Ordered    naproxen (NAPROSYN) 500 MG tablet  2 times daily        09/27/21 0315    oxyCODONE (ROXICODONE) 5 MG immediate release tablet  Every 4 hours PRN        09/27/21 0315             Discharge Instructions Discussed with and Provided to Patient:    Discharge Instructions      You were evaluated in the Emergency Department and after careful  evaluation, we did not find any emergent condition requiring admission or further testing in the hospital.  Your exam/testing today is overall reassuring.  Pain may be related to a loose hardware screw.  Important that you follow-up with Dr. Kathyrn Sheriff in the office, call the number provided.  Can use the Naprosyn twice daily for pain.  Can use the oxycodone for more significant pain.  Please return to the Emergency Department if you experience any worsening of your condition.   Thank you for allowing [place] to be a  part of your care.      Zachary Flakes, MD 09/27/21 [phone]

## 2021-09-29 DIAGNOSIS — F1594 Other stimulant use, unspecified with stimulant-induced mood disorder: Secondary | ICD-10-CM | POA: Insufficient documentation

## 2021-10-01 ENCOUNTER — Other Ambulatory Visit: Payer: Self-pay

## 2021-10-01 ENCOUNTER — Encounter (HOSPITAL_COMMUNITY): Payer: Self-pay | Admitting: *Deleted

## 2021-10-01 ENCOUNTER — Ambulatory Visit (HOSPITAL_COMMUNITY)
Admission: EM | Admit: 2021-10-01 | Discharge: 2021-10-01 | Disposition: A | Discharge status: Home / Self Care | Payer: Medicaid Other | Attending: Internal Medicine | Admitting: Internal Medicine

## 2021-10-01 ENCOUNTER — Ambulatory Visit (HOSPITAL_COMMUNITY)
Admission: EM | Admit: 2021-10-01 | Discharge: 2021-10-01 | Disposition: A | Discharge status: Home / Self Care | Payer: Medicaid Other

## 2021-10-01 ENCOUNTER — Emergency Department (HOSPITAL_COMMUNITY)
Admission: EM | Admit: 2021-10-01 | Discharge: 2021-10-01 | Disposition: A | Discharge status: Home / Self Care | Payer: Medicaid Other | Attending: Emergency Medicine | Admitting: Emergency Medicine

## 2021-10-01 DIAGNOSIS — F250 Schizoaffective disorder, bipolar type: Secondary | ICD-10-CM

## 2021-10-01 DIAGNOSIS — Z760 Encounter for issue of repeat prescription: Secondary | ICD-10-CM | POA: Diagnosis present

## 2021-10-01 DIAGNOSIS — N4889 Other specified disorders of penis: Secondary | ICD-10-CM

## 2021-10-01 LAB — POCT URINALYSIS DIPSTICK, ED / UC
Bilirubin Urine: NEGATIVE
Glucose, UA: NEGATIVE mg/dL
Hgb urine dipstick: NEGATIVE
Ketones, ur: NEGATIVE mg/dL
Leukocytes,Ua: NEGATIVE
Nitrite: NEGATIVE
Protein, ur: 30 mg/dL — AB
Specific Gravity, Urine: 1.02 (ref 1.005–1.030)
Urobilinogen, UA: 1 mg/dL (ref 0.0–1.0)
pH: 8.5 — ABNORMAL HIGH (ref 5.0–8.0)

## 2021-10-01 NOTE — Discharge Instructions (Signed)
We cannot refill your Latuda or Gabapentin.  You will need to be seen by your psychiatrist.  Please make an appointment at [facility].

## 2021-10-01 NOTE — ED Notes (Signed)
Patient verbalizes understanding of d/c instructions. Opportunities for questions and answers were provided. Pt d/c from ED and ambulated to lobby.  

## 2021-10-01 NOTE — BH Assessment (Addendum)
[name] to [employer] voluntarily with chief complaint of needing his medications (Latuda and Gabapentin). Pt is guarded and withdrawn, denies SI, AVH and substance use. Pt reports HI towards "authority figures" but does not have a identifiable person, plan or intent. Pt is routine.   TTS attempted to reach pt guardian without success. HIPPA complaint message left on VM.   TTS spoke with guardian [name] who confirms that she is still pt guardian. Guardian notified of pt arrival. Guardian reports "he does that from time to time". Guardian states "please take care of him". TTS notified guardian that pt is requesting medications and did not report any safety concerns and the plan is for pt to discharge. Guardian reports that pt has an apartment but he has been without water and lights for two days which was a "mix up". Guardian reports his lights and water should be back on tomorrow and she wishes for pt to stay at [facility] until then. TTS informed guardian that pt would more than likely discharge and the only safety concerns she reports is that pt does not have utilities at his house. Guardian states "I have to go" and hangs up on TTS. NP notified.

## 2021-10-01 NOTE — Discharge Instructions (Signed)
Increase oral fluid intake Urine test is negative for urinary tract infection Return to urgent care if you have any other concerns.

## 2021-10-01 NOTE — ED Triage Notes (Signed)
Pt reports he has kidney stones. Pt has blood in his urine and pain when voiding.

## 2021-10-01 NOTE — ED Notes (Signed)
Hippa VM left on guardian's answering machine.  Asked patient if he was going to need a ride or anything. Patient obviously had been agitated as table and chairs were turned over in room. States "I don't need nothing." Refused AVS. Belongings returned to patient and patient walked into lobby with no sxs of apparent distress

## 2021-10-01 NOTE — ED Provider Notes (Signed)
[facility]    CSN: [id number] Arrival date & time: 10/01/21  1458      History   Chief Complaint Chief Complaint  Patient presents with   Hematuria   Dysuria    HPI [name] is a 26 y.o. male comes to the urgent care with a 1 day history of bloody urine.  Patient complains of back pain of 2 days duration.  Pain was throbbing and fairly constant.  Denies any trauma, bruising in the back.  No fever or chills.  No nausea or vomiting.  Patient endorses dysuria, urgency and frequency.  He also endorses penile pain.  No abdominal pain.  Patient is not sexually active.  Last sexual activity was over 2 months ago.  HPI  Past Medical History:  Diagnosis Date   ADD (attention deficit disorder)    ADHD (attention deficit hyperactivity disorder), 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   Anemia    Anxiety    Bipolar disorder (HCC)    Borderline personality disorder (HCC)    Central auditory processing disorder    Deliberate self-cutting    Depressed    Eczema    Headache(784.0)    Oppositional defiant disorder    Schizophrenia [facility])     Patient Active Problem List   Diagnosis Date Noted   Schizoaffective disorder, bipolar type (HCC) 09/16/2016   Eating disorder Unspecified 05/22/2016   Borderline personality disorder (HCC) 05/16/2016   Tobacco use disorder 05/15/2016    Past Surgical History:  Procedure Laterality Date   BACK SURGERY     FRACTURE SURGERY     NO PAST SURGERIES     POSTERIOR LUMBAR FUSION N/A 02/19/2015   Procedure: LATERAL L-2 CORPECTOMY;  Surgeon: Lisbeth Renshaw, MD;  Location: [facility];  Service: Neurosurgery;  Laterality: N/A;   POSTERIOR LUMBAR FUSION 4 LEVEL  02/19/2015   Procedure: Posterior T-12 - L-4 STABILIZATION OF POSTERIOR LUMBAR;  Surgeon: Lisbeth Renshaw, MD;  Location: [facility] OR;  Service: Neurosurgery;;   TIBIA IM NAIL INSERTION Left 02/20/2015   Procedure: INTRAMEDULLARY (IM) NAIL LEFT TIBIAL;  Surgeon: [name],  MD;  Location: [facility] OR;  Service: Orthopedics;  Laterality: Left;       Home Medications    Prior to Admission medications   Medication Sig Start Date End Date Taking? Authorizing Provider  hydrOXYzine (ATARAX/VISTARIL) 25 MG tablet Take 1 tablet (25 mg total) by mouth 3 (three) times daily as needed for anxiety. 11/14/20   Karsten Ro, MD  ketoconazole (NIZORAL) 2 % cream Apply 1 application. topically daily. 09/04/21   Ward, Tylene Fantasia, PA-C  naproxen (NAPROSYN) 500 MG tablet Take 1 tablet (500 mg total) by mouth 2 (two) times daily. 09/27/21   Sabas Sous, MD  oxyCODONE (ROXICODONE) 5 MG immediate release tablet Take 1 tablet (5 mg total) by mouth every 4 (four) hours as needed for severe pain. 09/27/21   Sabas Sous, MD  topiramate (TOPAMAX) 50 MG tablet Take 50 mg by mouth in the morning, at noon, and at bedtime.    [provider]  benztropine (COGENTIN) 1 MG tablet Take 1 tablet (1 mg total) by mouth 2 (two) times daily. Patient not taking: Reported on 11/14/2020 05/04/19 11/14/20  Malvin Johns, MD  haloperidol decanoate (HALDOL DECANOATE) 50 MG/ML injection Inject 4 mLs (200 mg total) into the muscle every 28 (twenty-eight) days. Due 05/29/2019 Patient not taking: Reported on 11/14/2020 05/04/19 11/14/20  Malvin Johns, MD  lithium carbonate (ESKALITH) 450 MG CR tablet Take  1 tablet (450 mg total) by mouth every 12 (twelve) hours. Patient not taking: Reported on 11/14/2020 05/04/19 11/14/20  Johnn Hai, MD  traZODone (DESYREL) 150 MG tablet Take 1 tablet (150 mg total) by mouth at bedtime. Patient not taking: Reported on 11/14/2020 03/09/19 11/14/20  Johnn Hai, MD    Family History Family History  Problem Relation Age of Onset   Healthy Mother    Healthy Father     Social History Social History   Tobacco Use   Smoking status: Every Day    Packs/day: 2.00    Types: Cigarettes   Smokeless tobacco: Never   Tobacco comments:    1-3 packs   Vaping Use   Vaping Use: Former   Substance Use Topics   Alcohol use: Not Currently    Alcohol/week: 2.0 standard drinks    Types: 2 Shots of liquor per week   Drug use: Yes    Types: Marijuana    Comment: heroin     Allergies   Clozapine, Prolixin [fluphenazine], and Risperdal [risperidone]   Review of Systems Review of Systems  Gastrointestinal: Negative.   Genitourinary:  Positive for dysuria, flank pain, frequency, hematuria, penile pain and urgency. Negative for enuresis, genital sores, scrotal swelling and testicular pain.    Physical Exam Triage Vital Signs ED Triage Vitals  Enc Vitals Group     BP 10/01/21 1535 109/89     Pulse Rate 10/01/21 1535 89     Resp 10/01/21 1535 16     Temp 10/01/21 1535 98.9 F (37.2 C)     Temp src --      SpO2 10/01/21 1535 99 %     Weight --      Height --      Head Circumference --      Peak Flow --      Pain Score 10/01/21 1534 8     Pain Loc --      Pain Edu? --      Excl. in [place]? --    No data found.  Updated Vital Signs BP 109/89   Pulse 89   Temp 98.9 F (37.2 C)   Resp 16   SpO2 99%   Visual Acuity Right Eye Distance:   Left Eye Distance:   Bilateral Distance:    Right Eye Near:   Left Eye Near:    Bilateral Near:     Physical Exam Vitals and nursing note reviewed.  Constitutional:      General: He is not in acute distress.    Appearance: He is not ill-appearing.  HENT:     Right Ear: Tympanic membrane normal.     Left Ear: Tympanic membrane normal.  Cardiovascular:     Rate and Rhythm: Normal rate and regular rhythm.     Pulses: Normal pulses.     Heart sounds: Normal heart sounds.  Pulmonary:     Effort: Pulmonary effort is normal.     Breath sounds: Normal breath sounds.  Abdominal:     General: Abdomen is flat.     Tenderness: There is no abdominal tenderness. There is right CVA tenderness and left CVA tenderness. There is no guarding or rebound.  Skin:    Capillary Refill: Capillary refill takes less than 2 seconds.   Neurological:     General: No focal deficit present.     Mental Status: He is alert.     UC Treatments / Results  Labs (all labs ordered are listed, but only  abnormal results are displayed) Labs Reviewed  POCT URINALYSIS DIPSTICK, ED / UC - Abnormal; Notable for the following components:      Result Value   pH 8.5 (*)    Protein, ur 30 (*)    All other components within normal limits    EKG   Radiology No results found.  Procedures Procedures (including critical care time)  Medications Ordered in UC Medications - No data to display  Initial Impression / Assessment and Plan / UC Course  I have reviewed the triage vital signs and the nursing notes.  Pertinent labs & imaging results that were available during my care of the patient were reviewed by me and considered in my medical decision making (see chart for details).     1.  Penile pain: Point-of-care urinalysis is negative for UTI No ulcerations on the penis No scrotal lesions Over-the-counter pain medications Return to urgent care if you have any other concerns. Final Clinical Impressions(s) / UC Diagnoses   Final diagnoses:  Pain, penile     Discharge Instructions      Increase oral fluid intake Urine test is negative for urinary tract infection Return to urgent care if you have any other concerns.     ED Prescriptions   None    PDMP not reviewed this encounter.   Chase Picket, MD 10/01/21 [phone]

## 2021-10-01 NOTE — ED Notes (Signed)
HIPPA compliant message left on voicemail

## 2021-10-01 NOTE — ED Provider Notes (Signed)
[facility] Emergency Department Provider Note MRN:  [id number]  Arrival date & time: 10/01/21     Chief Complaint   Medication Refill   History of Present Illness   [name] is a 26 y.o. year-old male presents to the ED with chief complaint of medication refill of his Latuda and Gabapentin.  He states that he doesn't have a prescription for these and doesn't have a PCP.  He states that he never got a prescription filled in the past, but wants one now.  Was seen at behavioral health early tonight and discharged.  States that he feels anti social.  History provided by patient.   Review of Systems  Pertinent review of systems noted in HPI.    Physical Exam   Vitals:   10/01/21 0530  BP: 121/64  Pulse: 62  Resp: 16  Temp: (!) 97.4 F (36.3 C)  SpO2: 94%    CONSTITUTIONAL:  non toxic-appearing, NAD, texting on his phone NEURO:  Alert and oriented x 3, CN 3-12 grossly intact EYES:  eyes equal and reactive ENT/NECK:  Supple, no stridor  CARDIO:  appears well-perfused  PULM:  No respiratory distress,  GI/GU:  non-distended,  MSK/SPINE:  No gross deformities, no edema, moves all extremities  SKIN:  no rash, atraumatic   *Additional and/or pertinent findings included in MDM below  Diagnostic and Interventional Summary    EKG Interpretation  Date/Time:    Ventricular Rate:    PR Interval:    QRS Duration:   QT Interval:    QTC Calculation:   R Axis:     Text Interpretation:         Labs Reviewed - No data to display  No orders to display    Medications - No data to display   Procedures  /  Critical Care Procedures  ED Course and Medical Decision Making  I have reviewed the triage vital signs, the nursing notes, and pertinent available records from the EMR.  Social Determinants Affecting Complexity of Care: Patient has no clinically significant social determinants affecting this chief  complaint..   ED Course:   Patient here requesting medication refill of Latuda and Gabapentin. Medical Decision Making   I don't see anywhere that the patient had been prescribed Latuda in chart review.  He didn't have any medication changes at [place] and has been seen several times in the past few days by psychiatry without intervention or concern for emergent psychiatric condition.  I will refer the patient back to psychiatry. Consultants: No consultations were needed in caring for this patient.   Treatment and Plan: Emergency department workup does not suggest an emergent condition requiring admission or immediate intervention beyond  what has been performed at this time. The patient is safe for discharge and has  been instructed to return immediately for worsening symptoms, change in  symptoms or any other concerns    Final Clinical Impressions(s) / ED Diagnoses     ICD-10-CM   1. Medication refill  Z76.0       ED Discharge Orders     None         Discharge Instructions Discussed with and Provided to Patient:    Discharge Instructions      We cannot refill your Latuda or Gabapentin.  You will need to be seen by your psychiatrist.  Please make an appointment at [facility].      Montine Circle, PA-C 10/01/21 [phone]  Ripley Fraise, MD 10/01/21 [phone]

## 2021-10-01 NOTE — ED Notes (Signed)
Pt drinking a cup of water

## 2021-10-01 NOTE — ED Triage Notes (Addendum)
Pt states that he needs "med management" for his latuda and gabapentin. States he hasn't been able to take his medications because he never picked them up from pharmacy. Pt affect flat and is reluctant to answer questions. H/o bipolar, schizophrenia

## 2021-10-01 NOTE — ED Provider Notes (Addendum)
[facility] Medical Screening Exam  Patient Name: [name] MRN: [id number] Date of Evaluation: 10/01/21 Chief Complaint:   Diagnosis:  Final diagnoses:  Schizoaffective disorder, bipolar type (HCC)    History of Present illness: [name] is a 26 y.o. male with psychiatric history of schizoaffective disorder, borderline personality disorder, suicidal ideation, and substance abuse.  Patient presented voluntarily to [facility] for a walk-in assessment.  This NP met with patient face-to-face and reviewed his chart.  On assessment, patient is standing in the hallway and refusing to go in assessment room. Patient appears irritable with his arms folded across his chest. He is uncooperative and refusing to participate in assessment. When asked about reason for coming to [facility] patient states "I don't care to talk to you. I don't care about this" This writer attempted several times to redirect patient and offered patient options to talk more in detail with TTS Counselor patient states "I don't care about any of that. I just don't care." Patient refused to provide answer to all assessment questions asked my this writer. Patient did not appear to be responding to any internal/external stimuli and no evidence of mania or delusion thought content noted.   Per TTS Counselor , Falencio Thompson, LCMHC: [place] to [facility] voluntarily with chief complaint of needing his medications (Latuda and Gabapentin). Pt is guarded and withdrawn, denies SI, AVH and substance use. Pt reports HI towards "authority figures" but does not have a identifiable person, plan or intent.    Psychiatric Specialty Exam  Presentation  General Appearance:Appropriate for Environment  Eye Contact:Good  Speech:Clear and Coherent  Speech Volume:Normal  Handedness:Right   Mood and Affect  Mood:Irritable  Affect:Congruent   Thought Process  Thought  Processes:Coherent  Descriptions of Associations:-- (unable to assess, refused to participate in assessment)  Orientation:Full (Time, Place and Person)  Thought Content:-- (unable to assess, refused to participate in assessment)  Diagnosis of Schizophrenia or Schizoaffective disorder in past: Yes   Hallucinations:-- (unable to assess, refused to participate in assessment)  Ideas of Reference:-- (unable to assess, refused to participate in assessment)  Suicidal Thoughts:No With Intent; With Plan  Homicidal Thoughts:Yes, Passive Without Plan; Without Intent   Sensorium  Memory:-- (unable to assess, refused to participate in assessment)  Judgment:-- (unable to assess, refused to participate in assessment)  Insight:-- (unable to assess, refused to participate in assessment)   Executive Functions  Concentration:-- (unable to assess, refused to participate in assessment)  Attention Span:-- (unable to assess, refused to participate in assessment)  Recall:-- (unable to assess, refused to participate in assessment)  Fund of Knowledge:-- (unable to assess, refused to participate in assessment)  Language:-- (unable to assess, refused to participate in assessment)   Psychomotor Activity  Psychomotor Activity:Normal   Assets  Assets:-- (unable to assess, refused to participate in assessment)   Sleep  Sleep:-- (unable to assess, refused to participate in assessment)  Number of hours: 7   No data recorded  Physical Exam: Physical Exam Vitals reviewed.  Constitutional:      General: He is not in acute distress.    Appearance: Normal appearance. He is well-developed and normal weight. He is not ill-appearing, toxic-appearing or diaphoretic.  HENT:     Head: Normocephalic.  Eyes:     Conjunctiva/sclera: Conjunctivae normal.  Neck:     Comments: Unable to assess. Patient refused to participate in assessment Cardiovascular:     Rate and Rhythm: Normal rate.   Pulmonary:       Effort: Pulmonary effort is normal. No respiratory distress.  Abdominal:     Palpations: Abdomen is soft.     Comments: Unable to assess. Patient refused to participate in assessment  Musculoskeletal:        General: Normal range of motion.  Skin:    Comments: Unable to assess. Patient refused to participate in assessment  Neurological:     Mental Status: He is alert and oriented to person, place, and time.  Psychiatric:        Attention and Perception: Inattentive: Unable to assess. Patient refused to participate in assessment.        Mood and Affect: Anxious: Unable to assess. Patient refused to participate in assessment.        Speech: Speech normal.        Behavior: Behavior is uncooperative.        Thought Content: Paranoid: Unable to assess. Patient refused to participate in assessment. Homicidal: Unable to assess. Patient refused to participate in assessment. Suicidal: Unable to assess. Patient refused to participate in assessment.   Review of Systems  Reason unable to perform ROS: Unable to assess. Patient refused to participate in assessment.  Constitutional:        Unable to assess. Patient refused to participate in assessment  HENT:         Unable to assess. Patient refused to participate in assessment  Eyes:        Unable to assess. Patient refused to participate in assessment  Respiratory:         Unable to assess. Patient refused to participate in assessment  Cardiovascular:        Unable to assess. Patient refused to participate in assessment  Gastrointestinal:        Unable to assess. Patient refused to participate in assessment  Genitourinary:        Unable to assess. Patient refused to participate in assessment  Musculoskeletal:        Unable to assess. Patient refused to participate in assessment  Neurological:        Unable to assess. Patient refused to participate in assessment  Endo/Heme/Allergies:        Unable to assess. Patient refused  to participate in assessment  Psychiatric/Behavioral:         Unable to assess. Patient refused to participate in assessment  Blood pressure 117/64, pulse (!) 46, temperature 98.5 F (36.9 C), temperature source Oral, resp. rate 16, SpO2 100 %. There is no height or weight on file to calculate BMI.  Musculoskeletal: Strength & Muscle Tone: within normal limits Gait & Station: normal Patient leans: Right   [facility] MSE Discharge Disposition for Follow up and Recommendations: Based on my evaluation the patient does not appear to have an emergency medical condition and can be discharged with resources and follow up care in outpatient services for Medication Management and Individual Therapy  No evidence of imminent danger to self or others at this time. Patient does not meet criteria for psychiatric admission or IVC. Support and encouragement provided. Discussed crisis plan, callling [phone] or going to Emergency Dept if symptoms worsen. Encouraged patient to follow up with outpatient provider for medication refills.      A , NP 10/01/2021, 3:08 AM  

## 2021-10-16 ENCOUNTER — Telehealth (HOSPITAL_COMMUNITY): Payer: Self-pay

## 2021-10-16 NOTE — BH Assessment (Signed)
Care Management - [facility] Follow Up Discharges   Writer attempted to make contact with patient today and was unsuccessful.  Writer left a HIPPA compliant voice message with his legal guardian.   Per chart review, patient refused to stay overnight.  Therefore, he left AMA.

## 2021-12-18 ENCOUNTER — Emergency Department (HOSPITAL_COMMUNITY)
Admission: EM | Admit: 2021-12-18 | Discharge: 2021-12-18 | Disposition: A | Discharge status: Home / Self Care | Payer: Medicaid Other | Attending: Emergency Medicine | Admitting: Emergency Medicine

## 2021-12-18 ENCOUNTER — Encounter (HOSPITAL_COMMUNITY): Payer: Self-pay

## 2021-12-18 ENCOUNTER — Other Ambulatory Visit: Payer: Self-pay

## 2021-12-18 DIAGNOSIS — F29 Unspecified psychosis not due to a substance or known physiological condition: Secondary | ICD-10-CM | POA: Diagnosis not present

## 2021-12-18 DIAGNOSIS — Z20822 Contact with and (suspected) exposure to covid-19: Secondary | ICD-10-CM | POA: Diagnosis not present

## 2021-12-18 DIAGNOSIS — Z8659 Personal history of other mental and behavioral disorders: Secondary | ICD-10-CM

## 2021-12-18 DIAGNOSIS — R4689 Other symptoms and signs involving appearance and behavior: Secondary | ICD-10-CM

## 2021-12-18 DIAGNOSIS — R45851 Suicidal ideations: Secondary | ICD-10-CM | POA: Insufficient documentation

## 2021-12-18 DIAGNOSIS — F250 Schizoaffective disorder, bipolar type: Secondary | ICD-10-CM | POA: Diagnosis present

## 2021-12-18 DIAGNOSIS — Z87898 Personal history of other specified conditions: Secondary | ICD-10-CM

## 2021-12-18 LAB — CBC WITH DIFFERENTIAL/PLATELET
Abs Immature Granulocytes: 0.01 10*3/uL (ref 0.00–0.07)
Basophils Absolute: 0 10*3/uL (ref 0.0–0.1)
Basophils Relative: 0 %
Eosinophils Absolute: 0.1 10*3/uL (ref 0.0–0.5)
Eosinophils Relative: 1 %
HCT: 45.4 % (ref 39.0–52.0)
Hemoglobin: 15 g/dL (ref 13.0–17.0)
Immature Granulocytes: 0 %
Lymphocytes Relative: 38 %
Lymphs Abs: 2.3 10*3/uL (ref 0.7–4.0)
MCH: 27.5 pg (ref 26.0–34.0)
MCHC: 33 g/dL (ref 30.0–36.0)
MCV: 83.2 fL (ref 80.0–100.0)
Monocytes Absolute: 0.3 10*3/uL (ref 0.1–1.0)
Monocytes Relative: 5 %
Neutro Abs: 3.3 10*3/uL (ref 1.7–7.7)
Neutrophils Relative %: 56 %
Platelets: 258 10*3/uL (ref 150–400)
RBC: 5.46 MIL/uL (ref 4.22–5.81)
RDW: 13.8 % (ref 11.5–15.5)
WBC: 6.1 10*3/uL (ref 4.0–10.5)
nRBC: 0 % (ref 0.0–0.2)

## 2021-12-18 LAB — COMPREHENSIVE METABOLIC PANEL
ALT: 23 U/L (ref 0–44)
AST: 39 U/L (ref 15–41)
Albumin: 3.9 g/dL (ref 3.5–5.0)
Alkaline Phosphatase: 70 U/L (ref 38–126)
Anion gap: 8 (ref 5–15)
BUN: 8 mg/dL (ref 6–20)
CO2: 28 mmol/L (ref 22–32)
Calcium: 9.3 mg/dL (ref 8.9–10.3)
Chloride: 106 mmol/L (ref 98–111)
Creatinine, Ser: 0.82 mg/dL (ref 0.61–1.24)
GFR, Estimated: >60 mL/min (ref >60)
Glucose, Bld: 109 mg/dL — ABNORMAL HIGH (ref 70–99)
Potassium: 3.4 mmol/L — ABNORMAL LOW (ref 3.5–5.1)
Sodium: 142 mmol/L (ref 135–145)
Total Bilirubin: 0.4 mg/dL (ref 0.3–1.2)
Total Protein: 7.3 g/dL (ref 6.5–8.1)

## 2021-12-18 LAB — RESP PANEL BY RT-PCR (FLU A&B, COVID) ARPGX2
Influenza A by PCR: NEGATIVE
Influenza B by PCR: NEGATIVE
SARS Coronavirus 2 by RT PCR: NEGATIVE

## 2021-12-18 LAB — ETHANOL: Alcohol, Ethyl (B): <10 mg/dL (ref <10)

## 2021-12-18 MED ORDER — ALUM & MAG HYDROXIDE-SIMETH 200-200-20 MG/5ML PO SUSP: 30.0000 mL | Freq: Four times a day (QID) | ORAL | Status: DC | PRN

## 2021-12-18 MED ORDER — ONDANSETRON HCL 4 MG PO TABS: 4.0000 mg | ORAL_TABLET | Freq: Three times a day (TID) | ORAL | Status: DC | PRN

## 2021-12-18 MED ORDER — HYDROXYZINE HCL 25 MG PO TABS: 25.0000 mg | ORAL_TABLET | Freq: Three times a day (TID) | ORAL | Status: DC | PRN

## 2021-12-18 MED ORDER — ACETAMINOPHEN 325 MG PO TABS: 650.0000 mg | ORAL_TABLET | ORAL | Status: DC | PRN

## 2021-12-18 MED ORDER — NICOTINE 21 MG/24HR TD PT24
21.0000 mg | MEDICATED_PATCH | Freq: Every day | TRANSDERMAL | Status: DC
  Filled 2021-12-18: qty 1

## 2021-12-18 NOTE — ED Provider Notes (Signed)
Emergency Medicine Observation Re-evaluation Note  [name] is a 26 y.o. male, seen on rounds today.  Pt initially presented to the ED for complaints of mental health evaluation.  Currently pt reported feeling improved. He denies any thoughts of harm to self or others. Indicates he feels ready for d/c.    Physical Exam  BP 117/75 (BP Location: Right Arm)   Pulse 96   Temp (!) 97.5 F (36.4 C) (Oral)   Resp 17   Ht 1.753 m (5\' 9" )   Wt 68 kg   SpO2 100%   BMI 22.14 kg/m  Physical Exam General: alert, content, nad.  Cardiac: regular rate.  Lungs: breathing comfortably. Psych: normal mood and affect. Pt does not appear depressed. He is conversant, pleasant. Pt is not currently responding to internal stimuli - no delusions or hallucinations noted. No SI/HI.   ED Course / MDM    I have reviewed the labs performed to date as well as medications administered while in observation.  Recent changes in the last 24 hours include ED obs, reassessment.   Plan  Pt reports feeling improved and ready for d/c.   Pt expresses willingness to f/u as outpatient for [facility] and PCP needs.   Will provide additional resources.   Pt currently appears stable for d/c.        [facility], MD 12/18/21 [phone]

## 2021-12-18 NOTE — Discharge Instructions (Addendum)
It was our pleasure to provide your ER care today - we hope that you feel better.  Avoid drug use, as it is harmful to your physical health and mental well-being.   Follow up closely with behavioral health provider and primary care doctor in the next couple weeks.   For mental health issues and/or crisis, you may also go directly to the [facility] - it is open 24/7 and walk-ins are welcome.   Return to ER if worse, new symptoms, fevers, chest pain, trouble breathing, or other concern.

## 2021-12-18 NOTE — ED Triage Notes (Signed)
Pt here with SI and HI. PT has psych history and was on meds but ran out. Pt requesting inpatient care. Pt will not explain his SI or HI just states that his plan is to die with CO2. PT is pleasant and cooperative.

## 2021-12-18 NOTE — ED Provider Notes (Signed)
Emergency Medicine Observation Re-evaluation Note  [name] is a 26 y.o. male, seen on rounds today.  Pt initially presented to the ED for complaints of Suicidal and Homicidal Currently, the patient is eating breakfast.  Physical Exam  BP 117/75 (BP Location: Right Arm)   Pulse 96   Temp (!) 97.5 F (36.4 C) (Oral)   Resp 17   Ht 5\' 9"  (1.753 m)   Wt 68 kg   SpO2 100%   BMI 22.14 kg/m  Physical Exam General: No acute distress Cardiac: RRR Lungs: Normal effort Psych: Denies current SI/HI  ED Course / MDM  EKG:   I have reviewed the labs performed to date as well as medications administered while in observation.  No recent changes in the last 24 hours.  Plan  Current plan is for inpatient placement.  However patient is asking to leave, and given his prior recommendation we will have psychiatry reassess.  [name] LeGrand-Robertson is not under involuntary commitment.     Lucretia Roers, MD 12/18/21 [phone]

## 2021-12-18 NOTE — ED Notes (Signed)
Discharge instructions reviewed with patient. Patient verbalized understanding of instructions. Follow-up care and medications were reviewed. Patient ambulatory with steady gait. VSS upon discharge. IVC rescinded at this time.

## 2021-12-18 NOTE — ED Provider Notes (Signed)
8:49 AM Patient is planning to leave.  I have offered to have psychiatry reassess him.  However he is becoming more agitated and wanting to leave.  He denies current SI/HI though is also vague about why he came in last night.  Chart review shows psychiatry saw him in deemed him to be at high risk of suicide.  Needs inpatient.  Thus he will need to be involuntary committed, which I filled out and reassessed by psychiatry.   Pricilla Loveless, MD 12/18/21 [phone]

## 2021-12-18 NOTE — ED Notes (Signed)
Pt standing by ambulance bay and states he wants to leave. RN reported to MD and MD states he will IVC due to pt still being HI and SI. Pt states he will not go back to bed. RN called security. Security at bedside. Pt calm and RN explained care plan.

## 2021-12-18 NOTE — ED Provider Notes (Signed)
[facility] EMERGENCY DEPARTMENT Provider Note   CSN: [id number] Arrival date & time: 12/18/21  0043     History  Chief Complaint  Patient presents with   Suicidal   Homicidal    [name] is a 26 y.o. male.  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 Came to the ER tonight because he called 911 to ask the police to take him to the ER tonight and when the dispatcher asked him if he was able to walk to the ER it, "rubbed me the wrong way." States he is experiencing HI and SI but states when he has talked about this in the past he has wound up with a bunch of shots, strapped down and wakes up in the hospital days later. Denies chest pain, abdominal pain, any other complaints or concerns today.  Reports he is supposed to take medications for this but is out.        Home Medications Prior to Admission medications   Medication Sig Start Date End Date Taking? Authorizing Provider  hydrOXYzine (ATARAX/VISTARIL) 25 MG tablet Take 1 tablet (25 mg total) by mouth 3 (three) times daily as needed for anxiety. 11/14/20   Karsten Ro, MD  ketoconazole (NIZORAL) 2 % cream Apply 1 application. topically daily. 09/04/21   Ward, Tylene Fantasia, PA-C  naproxen (NAPROSYN) 500 MG tablet Take 1 tablet (500 mg total) by mouth 2 (two) times daily. 09/27/21   Sabas Sous, MD  oxyCODONE (ROXICODONE) 5 MG immediate release tablet Take 1 tablet (5 mg total) by mouth every 4 (four) hours as needed for severe pain. 09/27/21   Sabas Sous, MD  topiramate (TOPAMAX) 50 MG tablet Take 50 mg by mouth in the morning, at noon, and at bedtime.    [provider]  benztropine (COGENTIN) 1 MG tablet Take 1 tablet (1 mg total) by mouth 2 (two) times daily. Patient not taking: Reported on 11/14/2020 05/04/19 11/14/20  Malvin Johns, MD  haloperidol decanoate (HALDOL DECANOATE) 50 MG/ML injection Inject 4 mLs (200 mg total) into the  muscle every 28 (twenty-eight) days. Due 05/29/2019 Patient not taking: Reported on 11/14/2020 05/04/19 11/14/20  Malvin Johns, MD  lithium carbonate (ESKALITH) 450 MG CR tablet Take 1 tablet (450 mg total) by mouth every 12 (twelve) hours. Patient not taking: Reported on 11/14/2020 05/04/19 11/14/20  Malvin Johns, MD  traZODone (DESYREL) 150 MG tablet Take 1 tablet (150 mg total) by mouth at bedtime. Patient not taking: Reported on 11/14/2020 03/09/19 11/14/20  Malvin Johns, MD      Allergies    Clozapine, Prolixin [fluphenazine], and Risperdal [risperidone]    Review of Systems   Review of Systems Negative except as per HPI Physical Exam Updated Vital Signs BP 117/75 (BP Location: Right Arm)   Pulse 96   Temp (!) 97.5 F (36.4 C) (Oral)   Resp 17   Ht 5\' 9"  (1.753 m)   Wt 68 kg   SpO2 100%   BMI 22.14 kg/m  Physical Exam Vitals and nursing note reviewed.  Constitutional:      General: He is not in acute distress.    Appearance: He is well-developed. He is not diaphoretic.  HENT:     Head: Normocephalic and atraumatic.  Pulmonary:     Effort: Pulmonary effort is normal.  Skin:    General: Skin is warm and dry.     Findings: No erythema or rash.  Neurological:  Mental Status: He is alert and oriented to person, place, and time.  Psychiatric:        Mood and Affect: Mood is elated. Affect is blunt. Affect is not angry.        Speech: Speech normal.        Behavior: Behavior normal.        Thought Content: Thought content includes homicidal and suicidal ideation.     ED Results / Procedures / Treatments   Labs (all labs ordered are listed, but only abnormal results are displayed) Labs Reviewed  COMPREHENSIVE METABOLIC PANEL - Abnormal; Notable for the following components:      Result Value   Potassium 3.4 (*)    Glucose, Bld 109 (*)    All other components within normal limits  RESP PANEL BY RT-PCR (FLU A&B, COVID) ARPGX2  ETHANOL  CBC WITH DIFFERENTIAL/PLATELET   RAPID URINE DRUG SCREEN, HOSP PERFORMED    EKG None  Radiology No results found.  Procedures Procedures    Medications Ordered in ED Medications - No data to display  ED Course/ Medical Decision Making/ A&P                           Medical Decision Making Amount and/or Complexity of Data Reviewed Labs: ordered.   This patient presents to the ED for concern of suicidal and homicidal ideation, this involves an extensive number of treatment options, and is a complaint that carries with it a high risk of complications and morbidity.  The differential diagnosis includes psychosis   Co morbidities that complicate the patient evaluation  Depression, ODD, ADD, borderline personality disorder, anxiety, bipolar disorder, schizophrenia   Additional history obtained:  External records from outside source obtained and reviewed including recent visit to [facility] in [facility] for rhabdo after not eating or drinking well for several days.    Lab Tests:  I Ordered, and personally interpreted labs.  The pertinent results include: CBC unremarkable, CMP without significant findings.  COVID and flu negative.  Alcohol negative.  Patient has refused to provide a urine sample.   Cardiac Monitoring: / EKG:  Patient refused to cooperate with EKG  Consultations Obtained:  I requested consultation with the behavioral health team,  and discussed lab and imaging findings as well as pertinent plan - they recommend: Inpatient treatment, seeking placement.   Problem List / ED Course / Critical interventions / Medication management  26 year old male presents with request for psychiatric admission feeling suicidal and homicidal with history of same.  Out of his medications.  No complaints otherwise. I have reviewed the patients home medicines and have made adjustments as needed Patient is medically cleared for behavioral health evaluation and disposition   Social Determinants of  Health:  Medical noncompliance   Test / Admission - Considered:  Inpatient treatment         Final Clinical Impression(s) / ED Diagnoses Final diagnoses:  Psychosis, unspecified psychosis type [facility])    Rx / DC Orders ED Discharge Orders     None         Jeannie Fend, PA-C 12/18/21 0600    Cardama, Amadeo Garnet, MD 12/18/21 [phone]

## 2021-12-18 NOTE — ED Notes (Signed)
Pt requested more peanut butter and crackers. RN gave pt more crackers and peanut butter.

## 2021-12-18 NOTE — ED Notes (Signed)
Patient refused to let RN and Tech obtain an EKG

## 2021-12-18 NOTE — BH Assessment (Signed)
Comprehensive Clinical Assessment (CCA) Note  12/18/2021 [name] [id number]  Discharge Disposition: Zachary Ghee, NP, reviewed pt's chart and information and determined pt meets inpatient criteria. Pt's referral information will be faxed out to multiple hospitals, including [facility], for potential placement. This information was relayed to pt's team at 0350.  The patient demonstrates the following risk factors for suicide: Chronic risk factors for suicide include: psychiatric disorder of Schizo-Affective Disorder, Bipolar Type and previous suicide attempts in 2016 . Acute risk factors for suicide include: family or marital conflict and social withdrawal/isolation. Protective factors for this patient include: positive social support. Considering these factors, the overall suicide risk at this point appears to be high. Patient is not appropriate for outpatient follow up.  Therefore, a 1:1 sitter is recommended for suicide precautions.  Flowsheet Row ED from 12/18/2021 in [facility] EMERGENCY DEPARTMENT Most recent reading at 12/18/2021  1:29 AM ED from 10/01/2021 in [facility] at [facility] Most recent reading at 10/01/2021  3:37 PM ED from 10/01/2021 in [facility] EMERGENCY DEPARTMENT Most recent reading at 10/01/2021  5:32 AM  C-SSRS RISK CATEGORY High Risk No Risk No Risk     Chief Complaint:  Chief Complaint  Patient presents with   Suicidal   Homicidal   Visit Diagnosis: Schizo-Affective Disorder, Bipolar Type   CCA Screening, Triage and Referral (STR) [name] is a 26 year old patient who was brought to the [facility] via [facility] per pt's request. When asked why he came to the hospital, pt states, "There's no reason. 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 I'm having thoughts of hurting myself and other people."   Pt endorses current and previous SI. He states he attempted to kill himself in 2016 and that  he has been hospitalized in the past for mental health concerns (per pt's chart, he was hospitalized at [facility] 02/24/19 - 03/09/19 and 04/27/19 - 05/04/19. Pt has also had visits to the [facility] 10/15/20, 11/14/20, 09/21/21, 09/26/21, and 10/01/21. Pt states he would kill himself by using CO2 - when asked for specifics re: how pt would use CO2/how he would accomplish using CO2, pt was unable/unwilling to share additional information.   Pt acknowledges HI, though he refused to go into detail. He denies AVH and SA. Of note, pt's UDA has not yet resulted at this time.  Clinician attempted to make contact with [name], pt's other/guardian, at [phone]; call at [phone] was unsuccessful, as call went to voicemail and mailbox was full.  Pt is oriented x5. His recent/remote memory is intact. Pt was guarded and ornery throughout the assessment process. Pt's insight, judgement, and impulse control is poor at this time.  Patient Reported Information How did you hear about [place]? Self  What Is the Reason for Your Visit/Call Today? Pt states, "There's no reason. 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 I'm having thoughts of hurting myself and other people." Pt endorses current and previous SI. He states he attempted to kill himself in 2016 and that he has been hospitalized in the past for mental health concerns (per pt's chart, he was hospitalized at [facility] 02/24/19 - 03/09/19 and 04/27/19 - 05/04/19. Pt has also had visits to the [facility] 10/15/20, 11/14/20, 09/21/21, 09/26/21, and 10/01/21. Pt states he would kill himself by using CO2 - when asked for specifics re: how pt would use CO2/how he would accomplish using CO2, pt was unable/unwilling to share additional information. Pt acknowledges HI, though he refused to go into detail. He denies  AVH and SA. Of note, pt's UDA has not yet resulted at this time.  How Long Has This Been Causing You Problems? 1-6 months  What Do You Feel Would Help You the Most Today? Medication(s);  Treatment for Depression or other mood problem   Have You Recently Had Any Thoughts About Hurting Yourself? Yes  Are You Planning to Commit Suicide/Harm Yourself At This time? Yes   Have you Recently Had Thoughts About Hurting Someone [name]? Yes  Are You Planning to Harm Someone at This Time? No  Explanation: No data recorded  Have You Used Any Alcohol or Drugs in the Past 24 Hours? No  How Long Ago Did You Use Drugs or Alcohol? No data recorded What Did You Use and How Much? No data recorded  Do You Currently Have a Therapist/Psychiatrist? No  Name of Therapist/Psychiatrist: Dr. Jake [name] at [facility]   Have You Been Recently Discharged From Any Office Practice or Programs? Yes  Explanation of Discharge From Practice/Program: Pt shares he recently stopped receiving ACT Team services through [facility].     CCA Screening Triage Referral Assessment Type of Contact: Tele-Assessment  Telemedicine Service Delivery: Telemedicine service delivery: This service was provided via telemedicine using a 2-way, interactive audio and video technology  Is this Initial or Reassessment? Initial Assessment  Date Telepsych consult ordered in [facility]:  12/18/21  Time Telepsych consult ordered in [facility]:  0119  Location of Assessment: [facility] ED  Provider Location: [facility] Assessment Services   Collateral Involvement: [name], mother/[phone]; call at [phone] was unsuccessful, as call went to voicemail and mailbox was full   Does Patient Have a [place]? No data recorded Name and Contact of Legal Guardian: No data recorded If Minor and Not Living with Parent(s), Who has Custody? N/A  Is CPS involved or ever been involved? Never  Is APS involved or ever been involved? Never   Patient Determined To Be At Risk for Harm To Self or Others Based on Review of Patient Reported Information or Presenting Complaint? Yes, for Self-Harm  Method:  No data recorded Availability of Means: No data recorded Intent: No data recorded Notification Required: No data recorded Additional Information for Danger to Others Potential: No data recorded Additional Comments for Danger to Others Potential: No data recorded Are There Guns or Other Weapons in Your Home? No data recorded Types of Guns/Weapons: No data recorded Are These Weapons Safely Secured?                            No data recorded Who Could Verify You Are Able To Have These Secured: No data recorded Do You Have any Outstanding Charges, Pending Court Dates, Parole/Probation? No data recorded Contacted To Inform of Risk of Harm To Self or Others: [place] POA: [name], mother/guardian: [phone]; call at [phone] was unsuccessful, as call went to voicemail and mailbox was full)    Does Patient Present under Involuntary Commitment? No  IVC Papers Initial File Date: No data recorded  [place] of Residence: [place]   Patient Currently Receiving the Following Services: Not Receiving Services   Determination of Need: Emergent (2 hours)   Options For Referral: Medication Management; Inpatient Hospitalization; Outpatient Therapy     CCA Biopsychosocial Patient Reported Schizophrenia/Schizoaffective Diagnosis in Past: Yes   Strengths: Pt is able to identify that he is in need of mental health services. He answers the questions posed.   Mental Health  Symptoms Depression:   Irritability; Worthlessness; Hopelessness; Difficulty Concentrating; Change in energy/activity   Duration of Depressive symptoms:  Duration of Depressive Symptoms: Greater than two weeks   Mania:   None   Anxiety:    Fatigue; Worrying; Tension; Sleep   Psychosis:   None   Duration of Psychotic symptoms:    Trauma:   None   Obsessions:   None   Compulsions:   None   Inattention:   None   Hyperactivity/Impulsivity:   None   Oppositional/Defiant Behaviors:   None    Emotional Irregularity:   Mood lability; Potentially harmful impulsivity; Recurrent suicidal behaviors/gestures/threats   Other Mood/Personality Symptoms:   None noted    Mental Status Exam Appearance and self-care  Stature:   Average   Weight:   Average weight   Clothing:   -- (UTA: pt was covered up with a blanket)   Grooming:   Normal   Cosmetic use:   None   Posture/gait:   Normal   Motor activity:   Not Remarkable   Sensorium  Attention:   Normal   Concentration:   Normal   Orientation:   X5   Recall/memory:   Normal   Affect and Mood  Affect:   Depressed; Flat; Blunted   Mood:   Irritable; Depressed   Relating  Eye contact:   None (Pt was lying in his bed with his head covered/facing away throughout the entirety of the MH Assessment)   Facial expression:   -- (UTA: Pt was lying in his bed with his head covered/facing away throughout the entirety of the MH Assessment)   Attitude toward examiner:   Guarded; Irritable   Thought and Language  Speech flow:  Slow; Soft   Thought content:   Appropriate to Mood and Circumstances   Preoccupation:   Homicidal; Suicide   Hallucinations:   None   Organization:  No data recorded  [employer] of Knowledge:   Average   Intelligence:   Average   Abstraction:   Normal   Judgement:   Poor   Reality Testing:   Adequate   Insight:   Gaps   Decision Making:   Only simple   Social Functioning  Social Maturity:   Impulsive   Social Judgement:   Naive   Stress  Stressors:   Illness   Coping Ability:   Deficient supports   Skill Deficits:   Decision making; Interpersonal   Supports:   Family; Support needed     Religion: Religion/Spirituality Are You A Religious Person?:  (Not assessed) How Might This Affect Treatment?: Not assessed  Leisure/Recreation: Leisure / Recreation Do You Have Hobbies?:  (Not assessed) Leisure and Hobbies: Not  assessed  Exercise/Diet: Exercise/Diet Do You Exercise?:  (Not assessed) Have You Gained or Lost A Significant Amount of Weight in the Past Six Months?:  (Not assessed) Do You Follow a Special Diet?:  (Not assessed) Do You Have Any Trouble Sleeping?:  (Not assessed) Explanation of Sleeping Difficulties: Not assessed   CCA Employment/Education Employment/Work Situation: Employment / Work Situation Employment Situation: On disability Why is Patient on Disability: UTA How Long has Patient Been on Disability: UTA Patient's Job has Been Impacted by Current Illness: No Has Patient ever Been in the [employer]?: No  Education: Education Is Patient Currently Attending School?: No Last Grade Completed: 11 Did You Attend College?: No Did You Have An Individualized Education Program (IIEP):  (Not assessed) Did You Have Any Difficulty At School?:  (Not  assessed) Patient's Education Has Been Impacted by Current Illness:  (Not assessed)   CCA Family/Childhood History Family and Relationship History: Family history Marital status: Single Does patient have children?: No  Childhood History:  Childhood History By whom was/is the patient raised?: Mother, Grandparents Did patient suffer any verbal/emotional/physical/sexual abuse as a child?: No Did patient suffer from severe childhood neglect?: No Has patient ever been sexually abused/assaulted/raped as an adolescent or adult?: No Was the patient ever a victim of a crime or a disaster?: No Witnessed domestic violence?: No Has patient been affected by domestic violence as an adult?: No Description of domestic violence: N/A  Child/Adolescent Assessment:     CCA Substance Use Alcohol/Drug Use: Alcohol / Drug Use Pain Medications: See MAR Prescriptions: See MAR Over the Counter: See MAR History of alcohol / drug use?: Yes Longest period of sobriety (when/how long): Unknown Negative Consequences of Use:  (Denies) Withdrawal Symptoms:  None (Denies)                         ASAM's:  Six Dimensions of Multidimensional Assessment  Dimension 1:  Acute Intoxication and/or Withdrawal Potential:      Dimension 2:  Biomedical Conditions and Complications:      Dimension 3:  Emotional, Behavioral, or Cognitive Conditions and Complications:     Dimension 4:  Readiness to Change:     Dimension 5:  Relapse, Continued use, or Continued Problem Potential:     Dimension 6:  Recovery/Living Environment:     ASAM Severity Score:    ASAM Recommended Level of Treatment: ASAM Recommended Level of Treatment:  (N/A)   Substance use Disorder (SUD) Substance Use Disorder (SUD)  Checklist Symptoms of Substance Use:  (N/A)  Recommendations for Services/Supports/Treatments: Recommendations for Services/Supports/Treatments Recommendations For Services/Supports/Treatments: ACCTT (Assertive Community Treatment), Inpatient Hospitalization, Individual Therapy, Medication Management  Discharge Disposition: Erasmo Score, NP, reviewed pt's chart and information and determined pt meets inpatient criteria. Pt's referral information will be faxed out to multiple hospitals, including [facility], for potential placement. This information was relayed to pt's team at 0350.  DSM5 Diagnoses: Patient Active Problem List   Diagnosis Date Noted   Schizoaffective disorder, bipolar type ([place]) 09/16/2016   Eating disorder Unspecified 05/22/2016   Borderline personality disorder ([place]) 05/16/2016   Tobacco use disorder 05/15/2016     Referrals to Alternative Service(s): Referred to Alternative Service(s):   Place:   Date:   Time:    Referred to Alternative Service(s):   Place:   Date:   Time:    Referred to Alternative Service(s):   Place:   Date:   Time:    Referred to Alternative Service(s):   Place:   Date:   Time:     Dannielle Burn, LMFT

## 2021-12-18 NOTE — ED Notes (Signed)
Patient refused to have vital signs taken.

## 2021-12-18 NOTE — ED Notes (Signed)
Patient talking with TTS 

## 2021-12-18 NOTE — ED Notes (Signed)
Belonging stored in [place] [address] (1 black [employer] backpack and 1 belonging bag), a valuable belonging locked with security (cell phone)

## 2021-12-24 ENCOUNTER — Encounter (HOSPITAL_COMMUNITY): Payer: Self-pay | Admitting: Emergency Medicine

## 2021-12-24 ENCOUNTER — Emergency Department (HOSPITAL_COMMUNITY)
Admission: EM | Admit: 2021-12-24 | Discharge: 2021-12-24 | Discharge status: Against Medical Advice | Payer: Medicaid Other | Attending: Emergency Medicine | Admitting: Emergency Medicine

## 2021-12-24 ENCOUNTER — Other Ambulatory Visit: Payer: Self-pay

## 2021-12-24 DIAGNOSIS — Z5900 Homelessness unspecified: Secondary | ICD-10-CM | POA: Diagnosis not present

## 2021-12-24 DIAGNOSIS — N289 Disorder of kidney and ureter, unspecified: Secondary | ICD-10-CM | POA: Diagnosis present

## 2021-12-24 DIAGNOSIS — Z5321 Procedure and treatment not carried out due to patient leaving prior to being seen by health care provider: Secondary | ICD-10-CM | POA: Diagnosis not present

## 2021-12-24 NOTE — ED Triage Notes (Signed)
BIBA Per EMS: Pt c/o kidney pain. Pt is homeless.  VSS.

## 2022-01-16 ENCOUNTER — Ambulatory Visit (HOSPITAL_COMMUNITY)
Admission: EM | Admit: 2022-01-16 | Discharge: 2022-01-16 | Discharge status: Against Medical Advice | Payer: Medicaid Other | Attending: Family Medicine | Admitting: Family Medicine

## 2022-01-16 ENCOUNTER — Encounter (HOSPITAL_COMMUNITY): Payer: Self-pay | Admitting: Emergency Medicine

## 2022-01-16 ENCOUNTER — Ambulatory Visit (HOSPITAL_COMMUNITY)
Admission: RE | Admit: 2022-01-16 | Discharge: 2022-01-16 | Disposition: A | Discharge status: Home / Self Care | Payer: Medicaid Other | Attending: Psychiatry | Admitting: Psychiatry

## 2022-01-16 DIAGNOSIS — Z5321 Procedure and treatment not carried out due to patient leaving prior to being seen by health care provider: Secondary | ICD-10-CM

## 2022-01-16 DIAGNOSIS — F250 Schizoaffective disorder, bipolar type: Secondary | ICD-10-CM

## 2022-01-16 NOTE — ED Notes (Signed)
Pt seen exiting exam room going into the lobby.

## 2022-01-16 NOTE — ED Provider Notes (Signed)
  [facility] CARE CENTER   [id number] 01/16/22 Arrival Time: 1224  Patient left without being seen.    Mardella Layman, MD 01/16/22 [phone]

## 2022-01-16 NOTE — ED Triage Notes (Addendum)
Pt reports lower abdominal pain. States he feels a pulling sensation and noticed a "lump" in the upper abdomen that comes and goes. States unsure he has swollen lymph nodes or a hernia.  Also endorses urinary hesitancy and urgency. States he feels like he still has to urinate after urinating and notices his urine stream is not consistent and tends to stop.

## 2022-01-16 NOTE — H&P (Addendum)
Behavioral Health Medical Screening Exam  [name] [name] is a 26 y.o. male. with psychiatric history of schizoaffective disorder, borderline personality disorder, Chronic suicidal ideation, and substance abuse.  Patient presented [facility] to [facility] as a walk-in with complaints anxiety and suicidal ideation with plan/intent by Co2 poisoning.  On presentation, the patient is alert and oriented x3, has a flat affect, minimally cooperative and is evasive with some of his answers during this assessment.   Per chart review, the patient was at [facility] ED on 01/09/22 with similar complaint of SI with plan of Co2 poisoning and requesting for medication refills and psych admission. Pt was evaluated and discharged with outpatient psychiatric services  Further Chart review "indicates the patient has visited multiple emergency departments in [place] repeatedly since April 2023 with an uncooperative presentation, suicidal ideation, requesting psychiatric medication refills, and denying all other mental health support".   On evaluation, the Pt reports " I'm having anxiety and suicidal ideation by Co2 poisoning at this time". Pt denies HI, denies AVH. Pt denies being depressed and reports "I'm just dealing with stuff". Pt declined to elaborate on the "stuff".  On when he began having SI with plan, pt declined to comment.   Pt reports "I was at [facility] a couple of days ago, and they gave me Gabapentin and Abilify, I only take the gabapentin but stopped the Abilify because its not working for me".  Pt reports " they told me to follow up with a therapist but I want to get back on the meds they gave me at [facility]".  Pt reports " I was at [facility] for 6-8 months and they gave me a combination of two medications which helped me a lot, and I would like to get back on those meds if possible". Pt reports he is unable to recall the name of the medications, and he is not currently taking any  psychiatric medications or being followed by a mental health provider.   Pt reports he lives at [name] and is unemployed. Pt declined access or means to a gun or weapons at home.  Pt reports his sleep and appetite as poor. Pt denies illicit drug use.   Support, encouragement and reassurance provided about ongoing stressors.   On evaluation, patient is alert, oriented x 3, and minimally cooperative. Speech is clear and coherent. Pt appears casual. Eye contact is poor. Mood is depressed, affect is congruent with mood. Thought process and thought content is coherent. Pt endorses SI/ with intent/plan, denies HI/AVH. There is no indication that the patient is responding to internal stimuli. No delusions/paranoia elicited during this assessment.    Total Time spent with patient: 1 hour  Psychiatric Specialty Exam:  Presentation  General Appearance: Casual  Eye Contact:Poor  Speech:Clear and Coherent  Speech Volume:Decreased  Handedness:Right   Mood and Affect  Mood:Depressed; Anxious  Affect:Flat; Depressed   Thought Process  Thought Processes:Coherent  Descriptions of Associations:Intact  Orientation:Full (Time, Place and Person)  Thought Content:WDL  History of Schizophrenia/Schizoaffective disorder:Yes  Duration of Psychotic Symptoms:No data recorded Hallucinations:Hallucinations: None  Ideas of Reference:None  Suicidal Thoughts:Suicidal Thoughts: Yes, Active SI Active Intent and/or Plan: With Plan; With Intent  Homicidal Thoughts:Homicidal Thoughts: No   Sensorium  Memory:Immediate Poor  Judgment:Intact  Insight:Present   Executive Functions  Concentration:Good  Attention Span:Good  Recall:Poor  Fund of Knowledge:Fair  Language:Fair   Psychomotor Activity  Psychomotor Activity:Psychomotor Activity: Normal   Assets  Assets:Communication Skills; Desire for Improvement; Physical  Health   Sleep  Sleep:Sleep: Poor    Physical  Exam: Physical Exam Constitutional:      General: He is not in acute distress.    Appearance: He is not diaphoretic.  HENT:     Head: Normocephalic.     Right Ear: External ear normal.     Left Ear: External ear normal.     Nose: No congestion.  Eyes:     General:        Right eye: No discharge.        Left eye: No discharge.  Pulmonary:     Effort: No respiratory distress.  Chest:     Chest wall: No tenderness.  Neurological:     Mental Status: He is alert and oriented to person, place, and time.  Psychiatric:        Attention and Perception: Attention and perception normal.        Mood and Affect: Mood is depressed. Mood is not anxious. Affect is flat.        Speech: Speech normal.        Behavior: Behavior is cooperative.        Thought Content: Thought content is not paranoid or delusional. Thought content includes suicidal ideation. Thought content does not include homicidal ideation. Thought content includes suicidal plan. Thought content does not include homicidal plan.        Cognition and Memory: Cognition and memory normal.        Judgment: Judgment is not impulsive or inappropriate.    Review of Systems  Constitutional:  Negative for diaphoresis and fever.  HENT:  Negative for congestion.   Eyes:  Negative for discharge.  Respiratory:  Negative for cough, shortness of breath and wheezing.   Cardiovascular:  Negative for chest pain and palpitations.  Gastrointestinal:  Negative for diarrhea, nausea and vomiting.  Neurological:  Negative for dizziness, seizures, loss of consciousness, weakness and headaches.  Psychiatric/Behavioral:  Positive for suicidal ideas. Negative for depression, hallucinations and substance abuse. The patient is not nervous/anxious.     Musculoskeletal: Strength & Muscle Tone: within normal limits Gait & Station: normal Patient leans: N/A  [place] Scale:  Flowsheet Row OP Visit from 01/16/2022 in [place] Most recent reading at 01/16/2022 10:10 PM ED from 01/16/2022 in [facility] [place] Most recent reading at 01/16/2022  2:08 PM ED from 12/24/2021 in [place] DEPT Most recent reading at 12/24/2021  1:32 PM  C-SSRS RISK CATEGORY High Risk No Risk Error: Q3, 4, or 5 should not be populated when Q2 is No       Recommendations:  Based on my evaluation the patient does not appear to have an emergency medical condition.  Pt recommended  inpatient psychiatric admission for safety monitoring, and medication management. Pt to be admitted to the [facility] adult unit pending negative Covid test.   Pt declined Covid test and inpatient admission, and requested to leave AMA, stating " I don't want a covid test, and what difference does it make, if I test positive to Covid? Pt informed if he test positive to Covid he would be sent to the ED for safety monitoring until medically cleared. Pt reports "I don't want that shit, I don't want to go to the ED, I'm just gonna go home and contact the outpatient number they gave me at [facility] , I don't feel suicidal anymore".   Pt encouraged to be cooperative as he could  test negative. Pt declined and insists on leaving stating " I don't care for any covid test, I've been tested so many times it don't make no sense".    Pt provided with verbal encouragement to stay and follow with treatment plan. Pt provided education with benefits of staying versus leaving AMA. Pt acknowledged education provided,and his understanding but insists on leaving AMA. Pt denies SI/HI/AVH, now adding he came to the [facility] to get prescription for the two meds he was prescribed at [facility].   Although patient presented initially to the [facility] secondary to suicidal ideation, Pt now denies suicidal ideation and risk factors are mitigated by current protective factors: no active psychosis, motivated for treatment, future planning, and patient able to  vocalize needs and want for help anytime. This patient does NOT meet [facility] involuntary commitment criteria. Chart review shows the patient has a pattern of similar presentation/story to ED's, becoming uncooperative and requesting to leave AMA/discharge.   There is no evidence the patient is at imminent risk to self or others at this time.   Discussed methods to reduce the risk of self-injury or suicide attempts: Frequent conversations regarding unsafe thoughts. Remove all significant sharps. Remove all firearms. Remove all medications, including over-the-counter meds. Consider lockbox for medications and having a responsible person dispense medications until patient has strengthened coping skills. Room checks for sharps or other harmful objects. Secure all chemical substances that can be ingested or inhaled.   Please refrain from using alcohol or illicit substances, as they can affect your mood and can cause depression, anxiety or other concerning symptoms.  Alcohol can increase the chance that a person will make reckless decisions, like attempting suicide, and can increase the lethality of a drug overdose.  Pt verbalized his understanding.  Discussed crisis plan, calling [phone] or going to the ED as needed. Pt verbalized his understanding.   Pt left AMA and condition at discharge/AMA is stable.   Mancel Bale, NP 01/17/2022, 12:32 AM

## 2022-01-26 ENCOUNTER — Encounter (HOSPITAL_COMMUNITY): Payer: Self-pay | Admitting: Emergency Medicine

## 2022-01-26 ENCOUNTER — Other Ambulatory Visit: Payer: Self-pay

## 2022-01-26 ENCOUNTER — Emergency Department (HOSPITAL_COMMUNITY)
Admission: EM | Admit: 2022-01-26 | Discharge: 2022-01-27 | Disposition: A | Discharge status: Home / Self Care | Payer: Medicaid Other | Attending: Emergency Medicine | Admitting: Emergency Medicine

## 2022-01-26 DIAGNOSIS — M545 Low back pain, unspecified: Secondary | ICD-10-CM | POA: Insufficient documentation

## 2022-01-26 DIAGNOSIS — Z5321 Procedure and treatment not carried out due to patient leaving prior to being seen by health care provider: Secondary | ICD-10-CM | POA: Insufficient documentation

## 2022-01-26 DIAGNOSIS — M549 Dorsalgia, unspecified: Secondary | ICD-10-CM | POA: Diagnosis present

## 2022-01-26 NOTE — ED Notes (Signed)
Patient called x3 for vitals recheck with no response 

## 2022-01-26 NOTE — ED Triage Notes (Signed)
Patient here with complaint of chronic back pain, states he had a back surgery in 2016 and wants to make sure none of the hardware has come loose and also is requesting pain medication. Patient is alert, oriented, ambulates independently with steady gait, and is in no apparent distress at this time.

## 2022-01-27 ENCOUNTER — Encounter (HOSPITAL_COMMUNITY): Payer: Self-pay | Admitting: Emergency Medicine

## 2022-01-27 ENCOUNTER — Emergency Department (HOSPITAL_COMMUNITY)
Admission: EM | Admit: 2022-01-27 | Discharge: 2022-01-27 | Disposition: A | Discharge status: Home / Self Care | Payer: Medicaid Other | Source: Home / Self Care | Attending: Emergency Medicine | Admitting: Emergency Medicine

## 2022-01-27 DIAGNOSIS — M545 Low back pain, unspecified: Secondary | ICD-10-CM | POA: Insufficient documentation

## 2022-01-27 MED ORDER — LIDOCAINE 4 % EX PTCH: 1.0000 | MEDICATED_PATCH | Freq: Two times a day (BID) | CUTANEOUS | 0 refills | Status: AC

## 2022-01-27 MED ORDER — IBUPROFEN 600 MG PO TABS: 600.0000 mg | ORAL_TABLET | Freq: Four times a day (QID) | ORAL | 0 refills | Status: AC | PRN

## 2022-01-27 MED ORDER — NAPROXEN 250 MG PO TABS
500.0000 mg | ORAL_TABLET | Freq: Once | ORAL | Status: AC
  Administered 2022-01-27: 500 mg via ORAL
  Filled 2022-01-27: qty 2

## 2022-01-27 MED ORDER — ACETAMINOPHEN 500 MG PO TABS: 500.0000 mg | ORAL_TABLET | Freq: Four times a day (QID) | ORAL | 0 refills | Status: AC | PRN

## 2022-01-27 NOTE — ED Provider Notes (Signed)
[facility] EMERGENCY DEPARTMENT Provider Note   CSN: [id number] Arrival date & time: 01/27/22  1437     History  Chief Complaint  Patient presents with   Back Pain    Lucretia Roers Rita Prom is a 25 y.o. male.  HPI    26 year old patient comes in with chief complaint of back pain.  Patient indicates that he tried to commit suicide and ended up injuring his spine few years back.  He has hardware in his back.  He has intermittent episodes of back pain flareup. Pt has no associated numbness, weakness, urinary incontinence, urinary retention, bowel incontinence, pins and needle sensation in the perineal area. He is supposed to see a spine doctor, as there is discussion to revise his surgery as hardware has moved.   Home Medications Prior to Admission medications   Medication Sig Start Date End Date Taking? Authorizing Provider  acetaminophen (TYLENOL) 500 MG tablet Take 1 tablet (500 mg total) by mouth every 6 (six) hours as needed. 01/27/22  Yes Derwood Kaplan, MD  ibuprofen (ADVIL) 600 MG tablet Take 1 tablet (600 mg total) by mouth every 6 (six) hours as needed. 01/27/22  Yes Viann Nielson, MD  lidocaine 4 % Place 1 patch onto the skin 2 (two) times daily. 01/27/22  Yes Derwood Kaplan, MD  hydrOXYzine (ATARAX/VISTARIL) 25 MG tablet Take 1 tablet (25 mg total) by mouth 3 (three) times daily as needed for anxiety. Patient not taking: Reported on 12/18/2021 11/14/20   Karsten Ro, MD  ketoconazole (NIZORAL) 2 % cream Apply 1 application. topically daily. Patient not taking: Reported on 12/18/2021 09/04/21   Ward, Tylene Fantasia, PA-C  benztropine (COGENTIN) 1 MG tablet Take 1 tablet (1 mg total) by mouth 2 (two) times daily. Patient not taking: Reported on 11/14/2020 05/04/19 11/14/20  Malvin Johns, MD  haloperidol decanoate (HALDOL DECANOATE) 50 MG/ML injection Inject 4 mLs (200 mg total) into the muscle every 28 (twenty-eight) days. Due 05/29/2019 Patient not  taking: Reported on 11/14/2020 05/04/19 11/14/20  Malvin Johns, MD  lithium carbonate (ESKALITH) 450 MG CR tablet Take 1 tablet (450 mg total) by mouth every 12 (twelve) hours. Patient not taking: Reported on 11/14/2020 05/04/19 11/14/20  Malvin Johns, MD  traZODone (DESYREL) 150 MG tablet Take 1 tablet (150 mg total) by mouth at bedtime. Patient not taking: Reported on 11/14/2020 03/09/19 11/14/20  Malvin Johns, MD      Allergies    [name] [clozapine], Geodon [ziprasidone], Prolixin [fluphenazine], Risperdal [risperidone], and Vistaril [hydroxyzine]    Review of Systems   Review of Systems  Physical Exam Updated Vital Signs BP 125/82 (BP Location: Right Arm)   Pulse 60   Temp 98.2 F (36.8 C) (Oral)   Resp 16   SpO2 100%  Physical Exam Vitals and nursing note reviewed.  Constitutional:      Appearance: He is well-developed.  HENT:     Head: Atraumatic.  Cardiovascular:     Rate and Rhythm: Normal rate.  Pulmonary:     Effort: Pulmonary effort is normal.  Musculoskeletal:     Cervical back: Neck supple.     Comments: Ambulating w/o problems  Skin:    General: Skin is warm.  Neurological:     Mental Status: He is alert and oriented to person, place, and time.     ED Results / Procedures / Treatments   Labs (all labs ordered are listed, but only abnormal results are displayed) Labs Reviewed - No data to display  EKG None  Radiology No results found.  Procedures Procedures    Medications Ordered in ED Medications  naproxen (NAPROSYN) tablet 500 mg (500 mg Oral Given 01/27/22 1715)    ED Course/ Medical Decision Making/ A&P                           Medical Decision Making Risk OTC drugs. Prescription drug management.   26 year old patient comes in with chief complaint of back pain.  He has chronic back pain secondary to trauma few years back.  I have reviewed the x-rays from before, patient has hardware in place in his lumbar spine.  He states that he has  flareup of his chronic pain.  He is post to see a spine doctor in [facility] to see if he needs revision of his surgery.  He has no red flags suggesting cauda equina or cord compression.  Patient is ambulating without any difficulty.  I reviewed previous x-rays.  Repeat x-rays not indicated. Patient is in agreement with conservative management of his back pain.  I have discussed the case with patient's mother, who is the legal guardian.  She request that we give patient spine doctor at [facility] information. Final Clinical Impression(s) / ED Diagnoses Final diagnoses:  Chronic midline low back pain without sciatica    Rx / DC Orders ED Discharge Orders          Ordered    ibuprofen (ADVIL) 600 MG tablet  Every 6 hours PRN        01/27/22 1659    acetaminophen (TYLENOL) 500 MG tablet  Every 6 hours PRN        01/27/22 1659    lidocaine 4 %  2 times daily        01/27/22 [place], Avier Jech, MD 01/27/22 1730

## 2022-01-27 NOTE — ED Triage Notes (Signed)
Patient here with complaint of chronic back pain, patient states pain is usually worse this time of year. Denies any recent injury.

## 2022-01-27 NOTE — Discharge Instructions (Addendum)
For your acute flareup of the chronic pain, please follow-up with the spine surgeon as you intend. We have put in the phone number for our spine doctor on-call, who you may utilize if needed.  Take ibuprofen every 6-8 hours as needed for pain control.  Take Tylenol in addition if the pain is severe.  You may also apply lidocaine patch for further relief. Alternate between ice and heat therapy over your back to further alleviate symptoms.  Ensure you follow-up with your spine doctor for optimal management of your back issues.

## 2022-03-22 LAB — LAB REPORT - SCANNED
A1c: 5.7
EGFR: 143
HM HIV Screening: POSITIVE
HM Hepatitis Screen: NEGATIVE

## 2022-05-14 ENCOUNTER — Emergency Department (HOSPITAL_COMMUNITY)
Admission: EM | Admit: 2022-05-14 | Discharge: 2022-05-14 | Discharge status: Against Medical Advice | Payer: Medicaid Other

## 2022-05-14 NOTE — ED Triage Notes (Signed)
Patient BIB [facility] from home requesting clarification of information that was distributed to him through [facility]. Patient is alert, oriented, and in no apparent distress at this time.

## 2022-05-14 NOTE — ED Notes (Signed)
Patient  called for triage, no answer  x1 

## 2022-05-14 NOTE — ED Notes (Signed)
Patient called for triage, no answer x2 

## 2022-05-14 NOTE — ED Notes (Signed)
Patient called for triage, no answer x3 °

## 2022-06-11 ENCOUNTER — Other Ambulatory Visit: Payer: Self-pay

## 2022-06-11 ENCOUNTER — Emergency Department (HOSPITAL_COMMUNITY)
Admission: EM | Admit: 2022-06-11 | Discharge: 2022-06-11 | Discharge status: Against Medical Advice | Payer: Medicaid Other | Attending: Emergency Medicine | Admitting: Emergency Medicine

## 2022-06-11 DIAGNOSIS — M791 Myalgia, unspecified site: Secondary | ICD-10-CM | POA: Insufficient documentation

## 2022-06-11 DIAGNOSIS — R0981 Nasal congestion: Secondary | ICD-10-CM | POA: Insufficient documentation

## 2022-06-11 DIAGNOSIS — R5383 Other fatigue: Secondary | ICD-10-CM | POA: Insufficient documentation

## 2022-06-11 DIAGNOSIS — Z1152 Encounter for screening for COVID-19: Secondary | ICD-10-CM | POA: Insufficient documentation

## 2022-06-11 DIAGNOSIS — R059 Cough, unspecified: Secondary | ICD-10-CM | POA: Insufficient documentation

## 2022-06-11 DIAGNOSIS — Z5321 Procedure and treatment not carried out due to patient leaving prior to being seen by health care provider: Secondary | ICD-10-CM | POA: Insufficient documentation

## 2022-06-11 LAB — CBC
HCT: 50 % (ref 39.0–52.0)
Hemoglobin: 16.1 g/dL (ref 13.0–17.0)
MCH: 26.8 pg (ref 26.0–34.0)
MCHC: 32.2 g/dL (ref 30.0–36.0)
MCV: 83.3 fL (ref 80.0–100.0)
Platelets: 236 10*3/uL (ref 150–400)
RBC: 6 MIL/uL — ABNORMAL HIGH (ref 4.22–5.81)
RDW: 12.3 % (ref 11.5–15.5)
WBC: 3.2 10*3/uL — ABNORMAL LOW (ref 4.0–10.5)
nRBC: 0 % (ref 0.0–0.2)

## 2022-06-11 LAB — BASIC METABOLIC PANEL
Anion gap: 10 (ref 5–15)
BUN: 9 mg/dL (ref 6–20)
CO2: 26 mmol/L (ref 22–32)
Calcium: 9.4 mg/dL (ref 8.9–10.3)
Chloride: 100 mmol/L (ref 98–111)
Creatinine, Ser: 0.85 mg/dL (ref 0.61–1.24)
GFR, Estimated: >60 mL/min (ref >60)
Glucose, Bld: 82 mg/dL (ref 70–99)
Potassium: 3.7 mmol/L (ref 3.5–5.1)
Sodium: 136 mmol/L (ref 135–145)

## 2022-06-11 LAB — RESP PANEL BY RT-PCR (RSV, FLU A&B, COVID)  RVPGX2
Influenza A by PCR: NEGATIVE
Influenza B by PCR: NEGATIVE
Resp Syncytial Virus by PCR: NEGATIVE
SARS Coronavirus 2 by RT PCR: NEGATIVE

## 2022-06-11 NOTE — ED Provider Triage Note (Signed)
Emergency Medicine Provider Triage Evaluation Note  [name] , a 27 y.o. male  was evaluated in triage.  Pt complains of generalized body aches and fatigue. Symptomatic for 2 weeks. Endorses muscle cramps. Endorses associated cough and congestion. Denies fevers or chills. No meds PTA.  Review of Systems  Positive:  Negative:   Physical Exam  BP 137/77   Pulse 85   Temp 98.3 F (36.8 C) (Oral)   Resp 16   SpO2 100%  Gen:   Awake, no distress Resp:  Normal effort  MSK:   Moves extremities without difficulty  Other:    Medical Decision Making  Medically screening exam initiated at 1:34 AM.  Appropriate orders placed.  [name] was informed that the remainder of the evaluation will be completed by another provider, this initial triage assessment does not replace that evaluation, and the importance of remaining in the ED until their evaluation is complete.     Bud Face, PA-C 06/11/22 [phone]

## 2022-06-11 NOTE — ED Triage Notes (Signed)
Patient reports generalized body aches /fatigue for several weeks .

## 2022-06-12 ENCOUNTER — Encounter (HOSPITAL_COMMUNITY): Payer: Self-pay | Admitting: Emergency Medicine

## 2022-06-12 ENCOUNTER — Other Ambulatory Visit: Payer: Self-pay

## 2022-06-12 ENCOUNTER — Telehealth: Payer: Self-pay | Admitting: Infectious Disease

## 2022-06-12 ENCOUNTER — Ambulatory Visit (HOSPITAL_COMMUNITY)
Admission: EM | Admit: 2022-06-12 | Discharge: 2022-06-12 | Disposition: A | Discharge status: Home / Self Care | Payer: Medicaid Other | Attending: Family Medicine | Admitting: Family Medicine

## 2022-06-12 DIAGNOSIS — Z206 Contact with and (suspected) exposure to human immunodeficiency virus [HIV]: Secondary | ICD-10-CM | POA: Diagnosis not present

## 2022-06-12 DIAGNOSIS — Z711 Person with feared health complaint in whom no diagnosis is made: Secondary | ICD-10-CM | POA: Insufficient documentation

## 2022-06-12 LAB — HIV ANTIBODY (ROUTINE TESTING W REFLEX): HIV Screen 4th Generation wRfx: REACTIVE — AB

## 2022-06-12 NOTE — ED Provider Notes (Addendum)
[facility]   [id number] 06/12/22 Arrival Time: 7829  ASSESSMENT & PLAN:  1. Contact with and (suspected) exposure to human immunodeficiency virus (hiv)   2. Concern about STD in male without diagnosis    Discharge Instructions    Labs Pending  HIV ANTIBODY (ROUTINE TESTING W REFLEX)  RPR   We will call you with any positive results and help you find care if needed.    Reviewed expectations re: course of current medical issues. Questions answered. Outlined signs and symptoms indicating need for more acute intervention. Patient verbalized understanding. After Visit Summary given.   SUBJECTIVE:  [name] is a 27 y.o. male who reports + HIV one month ago at OSH; no availble results in our system that I can find. Denies IV drug use. Sex only with women. Patient has a PCP in [facility], but has not felt supported by that office and no longer lives in [place].   Without specific symptoms.  OBJECTIVE:  Vitals:   06/12/22 1133  BP: 112/72  Pulse: (!) 54  Resp: 20  Temp: 97.7 F (36.5 C)  TempSrc: Oral  SpO2: 98%    General appearance: alert, cooperative, appears stated age and no distress Throat: lips, mucosa, and tongue normal; teeth and gums normal Skin: warm and dry Psychological: alert and cooperative; normal mood and affect.   Labs Reviewed  HIV ANTIBODY (ROUTINE TESTING W REFLEX)  RPR    Allergies  Allergen Reactions   [name] [Clozapine] Hives and Other (See Comments)    Increased blood pressure   Geodon [Ziprasidone] Other (See Comments)    Caused seizures   Prolixin [Fluphenazine] Other (See Comments)    Hallucinations   Risperdal [Risperidone] Other (See Comments)    Unknown   Vistaril [Hydroxyzine] Other (See Comments)    Drowsiness     Past Medical History:  Diagnosis Date   ADD (attention deficit disorder)    ADHD (attention deficit hyperactivity disorder), 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   Anemia     Anxiety    Bipolar disorder (HCC)    Borderline personality disorder ([place])    Central auditory processing disorder    Deliberate self-cutting    Depressed    Eczema    Headache(784.0)    Oppositional defiant disorder    Schizophrenia ([place])    Family History  Problem Relation Age of Onset   Healthy Mother    Healthy Father    Social History   Socioeconomic History   Marital status: Single    Spouse name: Not on file   Number of children: Not on file   Years of education: Not on file   Highest education level: Not on file  Occupational History   Occupation: Unemployed  Tobacco Use   Smoking status: Every Day    Packs/day: 2.00    Types: Cigarettes   Smokeless tobacco: Never   Tobacco comments:    1-3 packs   Vaping Use   Vaping Use: Some days  Substance and Sexual Activity   Alcohol use: Not Currently    Alcohol/week: 2.0 standard drinks of alcohol    Types: 2 Shots of liquor per week   Drug use: Not Currently    Types: Marijuana    Comment: heroin   Sexual activity: Yes    Birth control/protection: None    Comment: pt reluctant to answer questions and frequently stated " I dont know"  Other Topics Concern   Not on file  Social History Narrative   **  Merged History Encounter **    Pt reported that he is unemployed, homeless as his mother is in rehab   Social Determinants of Health   Financial Resource Strain: Not on file  Food Insecurity: Not on file  Transportation Needs: Not on file  Physical Activity: Not on file  Stress: Not on file  Social Connections: Not on file  Intimate Partner Violence: Not on file           Vanessa Kick, MD 06/12/22 2952    Vanessa Kick, MD 06/18/22 [phone]

## 2022-06-12 NOTE — Telephone Encounter (Signed)
Black man with prelim + HIV test here but reported to ER staff that he had tested + for HIV at another hospital already  Since he seems aware of his diagnosis I would think it would be ok to directly call him up and schedule him in our office  HE is a treatment naive patient it seems.  Unless there is a contraindication, he would have a great opportunity to do the VOLITION study which would start him on Dovato --after labs come back and provide free medications, free labs and close follow-up at [facility] along with monetary compensation that is not coercive and c/w national guideliness  This study would also give him the option of CHOOSING to go on to [place] much earlier than is indicated that the label vs staying on oral therapy

## 2022-06-12 NOTE — Discharge Instructions (Signed)
Labs Pending  HIV ANTIBODY (ROUTINE TESTING W REFLEX)  RPR   We will call you with any positive results and help you find care if needed.

## 2022-06-12 NOTE — ED Triage Notes (Signed)
Diagnosed with HIV one month ago.  Patient has a pcp in [place], but has not felt supported by that office and no longer lives in [place].    Patient has been tired and body aches.  Patient is scared, he does not know where to follow up and what medicines needed and no guidance.

## 2022-06-13 LAB — HIV-1/2 AB - DIFFERENTIATION
HIV 1 Ab: REACTIVE — AB
HIV 2 Ab: NONREACTIVE

## 2022-06-13 LAB — RPR: RPR Ser Ql: NONREACTIVE

## 2022-06-13 NOTE — Telephone Encounter (Signed)
Attempted to call patient to schedule new patient appointment, call cannot be completed.   Beryle Flock, RN

## 2022-06-14 NOTE — Telephone Encounter (Signed)
Referral faxed to [facility] to assist with linkage to care.   Beryle Flock, RN

## 2022-06-29 ENCOUNTER — Emergency Department (HOSPITAL_COMMUNITY)
Admission: EM | Admit: 2022-06-29 | Discharge: 2022-06-29 | Disposition: A | Discharge status: Home / Self Care | Payer: Medicaid Other | Attending: Emergency Medicine | Admitting: Emergency Medicine

## 2022-06-29 DIAGNOSIS — Z1152 Encounter for screening for COVID-19: Secondary | ICD-10-CM | POA: Diagnosis not present

## 2022-06-29 DIAGNOSIS — M791 Myalgia, unspecified site: Secondary | ICD-10-CM | POA: Diagnosis present

## 2022-06-29 DIAGNOSIS — R6883 Chills (without fever): Secondary | ICD-10-CM | POA: Diagnosis not present

## 2022-06-29 DIAGNOSIS — R6889 Other general symptoms and signs: Secondary | ICD-10-CM

## 2022-06-29 LAB — RESP PANEL BY RT-PCR (RSV, FLU A&B, COVID)  RVPGX2
Influenza A by PCR: NEGATIVE
Influenza B by PCR: NEGATIVE
Resp Syncytial Virus by PCR: NEGATIVE
SARS Coronavirus 2 by RT PCR: NEGATIVE

## 2022-06-29 NOTE — ED Provider Notes (Signed)
Reading Provider Note   CSN: [id number] Arrival date & time: 06/29/22  [id number]     History  Chief Complaint  Patient presents with   Generalized Body Aches    Margaretha Seeds Tareek Thelen is a 27 y.o. male with medical history of anemia, anxiety, bipolar disorder, BPD, oppositional defiant disorder, schizophrenia.  Patient presents to ED for evaluation of generalized bodyaches and chills, "I do not feel good".  Patient reports that he began not feeling well "a couple days ago", patient will not give me an exact time.  Patient states that he has generalized bodyaches and chills.  Patient denies sore throat, nausea, vomiting, fever, cough, shortness of breath, chest pain, abdominal pain, diarrhea.  Patient denies taking medications prior to arrival.  HPI     Home Medications Prior to Admission medications   Medication Sig Start Date End Date Taking? Authorizing Provider  acetaminophen (TYLENOL) 500 MG tablet Take 1 tablet (500 mg total) by mouth every 6 (six) hours as needed. 01/27/22   Varney Biles, MD  citalopram (CELEXA) 10 MG tablet Take 10 mg by mouth every morning.    [provider]  hydrOXYzine (ATARAX/VISTARIL) 25 MG tablet Take 1 tablet (25 mg total) by mouth 3 (three) times daily as needed for anxiety. Patient not taking: Reported on 12/18/2021 11/14/20   Armando Reichert, MD  ibuprofen (ADVIL) 600 MG tablet Take 1 tablet (600 mg total) by mouth every 6 (six) hours as needed. 01/27/22   Varney Biles, MD  ketoconazole (NIZORAL) 2 % cream Apply 1 application. topically daily. Patient not taking: Reported on 12/18/2021 09/04/21   Ward, Janett Billow Z, PA-C  lidocaine 4 % Place 1 patch onto the skin 2 (two) times daily. 01/27/22   Varney Biles, MD  benztropine (COGENTIN) 1 MG tablet Take 1 tablet (1 mg total) by mouth 2 (two) times daily. Patient not taking: Reported on 11/14/2020 05/04/19 11/14/20  Johnn Hai, MD   haloperidol decanoate (HALDOL DECANOATE) 50 MG/ML injection Inject 4 mLs (200 mg total) into the muscle every 28 (twenty-eight) days. Due 05/29/2019 Patient not taking: Reported on 11/14/2020 05/04/19 11/14/20  Johnn Hai, MD  lithium carbonate (ESKALITH) 450 MG CR tablet Take 1 tablet (450 mg total) by mouth every 12 (twelve) hours. Patient not taking: Reported on 11/14/2020 05/04/19 11/14/20  Johnn Hai, MD  traZODone (DESYREL) 150 MG tablet Take 1 tablet (150 mg total) by mouth at bedtime. Patient not taking: Reported on 11/14/2020 03/09/19 11/14/20  Johnn Hai, MD      Allergies    [name] [clozapine], Geodon [ziprasidone], Prolixin [fluphenazine], Risperdal [risperidone], and Vistaril [hydroxyzine]    Review of Systems   Review of Systems  Constitutional:  Positive for chills. Negative for fever.  Musculoskeletal:  Positive for myalgias.  All other systems reviewed and are negative.   Physical Exam Updated Vital Signs BP 134/74 (BP Location: Right Arm)   Pulse 67   Temp 98.3 F (36.8 C)   Resp 17   SpO2 100%  Physical Exam Vitals and nursing note reviewed.  Constitutional:      General: He is not in acute distress.    Appearance: He is well-developed.     Comments: No distress, patient texting on phone throughout duration of interview  HENT:     Head: Normocephalic and atraumatic.     Mouth/Throat:     Mouth: Mucous membranes are moist.     Pharynx: Oropharynx is clear. No oropharyngeal exudate or  posterior oropharyngeal erythema.     Comments: Handling secretions appropriately Eyes:     Conjunctiva/sclera: Conjunctivae normal.  Cardiovascular:     Rate and Rhythm: Normal rate and regular rhythm.     Heart sounds: No murmur heard. Pulmonary:     Effort: Pulmonary effort is normal. No respiratory distress.     Breath sounds: Normal breath sounds.  Abdominal:     Palpations: Abdomen is soft.     Tenderness: There is no abdominal tenderness.  Musculoskeletal:         General: No swelling.     Cervical back: Neck supple.  Skin:    General: Skin is warm and dry.     Capillary Refill: Capillary refill takes less than 2 seconds.  Neurological:     Mental Status: He is alert and oriented to person, place, and time.  Psychiatric:        Mood and Affect: Mood normal.     ED Results / Procedures / Treatments   Labs (all labs ordered are listed, but only abnormal results are displayed) Labs Reviewed  RESP PANEL BY RT-PCR (RSV, FLU A&B, COVID)  [id number]    EKG None  Radiology No results found.  Procedures Procedures   Medications Ordered in ED Medications - No data to display  ED Course/ Medical Decision Making/ A&P  Medical Decision Making  27 year old male presents to ED for evaluation of URI symptoms.  Please see HPI for further details.  On examination patient afebrile and nontachycardic.  Lung sounds are clear bilaterally, not hypoxic.  Abdomen soft and compressible.  Posterior oropharynx is not erythematous, no exudate.  Uvula midline.  Handling secretions appropriately.  No drooling.  No signs of Ludwig angina.  Patient viral testing collected.  Patient will be advised to follow-up on results on MyChart.  Return precautions provided.  Patient voiced understanding.  Patient had all questions answered to his satisfaction.  Patient advised to treat symptoms conservatively at home.  Patient stable for discharge.   Final Clinical Impression(s) / ED Diagnoses Final diagnoses:  Flu-like symptoms    Rx / DC Orders ED Discharge Orders     None         Lawana Chambers 06/29/22 0947    Noemi Chapel, MD 06/30/22 [phone]

## 2022-06-29 NOTE — ED Triage Notes (Signed)
Patient here with complaint of generalized body aches and nasal congestion starting two days ago. Patient afebrile in triage, is alert, oriented, ambulating independently with steady gait and is in no apparent distress at this time.

## 2022-06-29 NOTE — Discharge Instructions (Signed)
Return to the ED with any new or worsening signs or symptoms Follow-up on the results of your viral testing done here today on MyChart.  Please sign up for MyChart if you are not registered. You may take ibuprofen for body aches and chills.  He may take Tylenol for headaches and fevers.  He may take Zicam to help shorten duration of cold symptoms.  Please push fluids to include body armor, Pedialyte.  Please eat high-protein low-fat diet.  Please wear mask when in public.

## 2022-07-17 ENCOUNTER — Other Ambulatory Visit: Payer: Self-pay

## 2022-07-17 ENCOUNTER — Emergency Department (HOSPITAL_COMMUNITY)
Admission: EM | Admit: 2022-07-17 | Discharge: 2022-07-17 | Discharge status: Against Medical Advice | Payer: Medicaid Other | Attending: Emergency Medicine | Admitting: Emergency Medicine

## 2022-07-17 ENCOUNTER — Encounter (HOSPITAL_COMMUNITY): Payer: Self-pay

## 2022-07-17 DIAGNOSIS — Z5321 Procedure and treatment not carried out due to patient leaving prior to being seen by health care provider: Secondary | ICD-10-CM | POA: Insufficient documentation

## 2022-07-17 DIAGNOSIS — X58XXXA Exposure to other specified factors, initial encounter: Secondary | ICD-10-CM | POA: Diagnosis not present

## 2022-07-17 DIAGNOSIS — T1491XA Suicide attempt, initial encounter: Secondary | ICD-10-CM | POA: Diagnosis present

## 2022-07-17 NOTE — ED Triage Notes (Signed)
Pt reports "I dont want to kill myself but I see it as logical way out. I do think about killing a lot of people due to being in complicated situation right now and I just need my meds filled." Pt reports needing Celexa refilled.

## 2022-07-17 NOTE — ED Notes (Signed)
MD aware patient left

## 2022-07-17 NOTE — ED Notes (Signed)
Pt comes out of room and states "how much longer" explained to patient that I can't give a wait time. Pt grabs things and walk out door. Advised patient to stay but he refused.

## 2022-08-06 ENCOUNTER — Emergency Department (HOSPITAL_COMMUNITY)
Admission: EM | Admit: 2022-08-06 | Discharge: 2022-08-06 | Discharge status: Against Medical Advice | Payer: Medicaid Other | Attending: Emergency Medicine | Admitting: Emergency Medicine

## 2022-08-06 ENCOUNTER — Encounter (HOSPITAL_COMMUNITY): Payer: Self-pay | Admitting: *Deleted

## 2022-08-06 ENCOUNTER — Emergency Department (HOSPITAL_COMMUNITY)
Admission: EM | Admit: 2022-08-06 | Discharge: 2022-08-07 | Discharge status: Against Medical Advice | Payer: Medicaid Other | Source: Home / Self Care

## 2022-08-06 ENCOUNTER — Other Ambulatory Visit: Payer: Self-pay

## 2022-08-06 DIAGNOSIS — Z5321 Procedure and treatment not carried out due to patient leaving prior to being seen by health care provider: Secondary | ICD-10-CM | POA: Insufficient documentation

## 2022-08-06 DIAGNOSIS — Z21 Asymptomatic human immunodeficiency virus [HIV] infection status: Secondary | ICD-10-CM | POA: Diagnosis not present

## 2022-08-06 DIAGNOSIS — Z760 Encounter for issue of repeat prescription: Secondary | ICD-10-CM | POA: Insufficient documentation

## 2022-08-06 DIAGNOSIS — F319 Bipolar disorder, unspecified: Secondary | ICD-10-CM | POA: Diagnosis not present

## 2022-08-06 NOTE — ED Notes (Signed)
Pt witnessed walking out by another Therapist, sports and hitting door

## 2022-08-06 NOTE — ED Triage Notes (Signed)
Pt was discharged from [facility], requesting Celexa and gabapentin to be refilled. Been out of a month and a half

## 2022-08-06 NOTE — ED Triage Notes (Signed)
Pt states "I need to get my meds"  "I was discharged from [facility] for missed appts but I don't even care cause they wouldn't give me my celexa and gabapentin"  " I just need my bipolar meds"  Also pt is concerned as he states he was recently diagnosed with HIV and has been unable to arrange those medications either.

## 2022-08-06 NOTE — ED Notes (Signed)
Assumed care of pt in hall bed here wanting prescriptions for gabapentin celexa and unknown bipolar meds after being discharged from [facility] for failure to make his appointments. Pt a/o x 4 respirations even and non labored

## 2022-08-07 NOTE — ED Notes (Signed)
Informed by registration that pt was seen leaving through the exit earlier and has not returned

## 2022-08-19 ENCOUNTER — Emergency Department (HOSPITAL_COMMUNITY)
Admission: EM | Admit: 2022-08-19 | Discharge: 2022-08-19 | Discharge status: Against Medical Advice | Payer: Medicaid Other | Attending: Emergency Medicine | Admitting: Emergency Medicine

## 2022-08-19 ENCOUNTER — Encounter (HOSPITAL_COMMUNITY): Payer: Self-pay

## 2022-08-19 ENCOUNTER — Other Ambulatory Visit: Payer: Self-pay

## 2022-08-19 DIAGNOSIS — M791 Myalgia, unspecified site: Secondary | ICD-10-CM | POA: Diagnosis present

## 2022-08-19 DIAGNOSIS — R52 Pain, unspecified: Secondary | ICD-10-CM

## 2022-08-19 DIAGNOSIS — Z5321 Procedure and treatment not carried out due to patient leaving prior to being seen by health care provider: Secondary | ICD-10-CM | POA: Diagnosis not present

## 2022-08-19 NOTE — ED Provider Notes (Signed)
Patient left without being seen   Zachary Rhine, MD 08/19/22 [phone]

## 2022-08-19 NOTE — ED Notes (Signed)
This RN attempted to take arrival vitals for this pt. Pt was noted to have left the department. This RN walked the parking lot and did not visualize pt. Lobby tech reported visualizing him exiting the building. Attmpted to contact Pt legal guardian (his mother), but the number on file wasn't valid. Security made aware as is CN.

## 2022-08-19 NOTE — ED Triage Notes (Signed)
Pt to ED by EMS from home c/o generalized pain all over. Pt was diagnosed with Rhabdo a few weeks ago. Arrives A+O, VSS, NADN. Ambulatory from EMS bay to ED.

## 2022-08-28 ENCOUNTER — Telehealth: Payer: Self-pay

## 2022-08-28 NOTE — Telephone Encounter (Signed)
Called patient to schedule B20 appointment, no answer. Left HIPAA compliant voicemail requesting callback.   Juandedios Dudash D Leontyne Manville, RN  

## 2022-09-06 ENCOUNTER — Other Ambulatory Visit (HOSPITAL_COMMUNITY): Payer: Self-pay

## 2022-09-10 ENCOUNTER — Other Ambulatory Visit: Payer: Self-pay

## 2022-09-10 ENCOUNTER — Encounter: Payer: Self-pay | Admitting: Infectious Diseases

## 2022-09-10 ENCOUNTER — Ambulatory Visit (INDEPENDENT_AMBULATORY_CARE_PROVIDER_SITE_OTHER): Payer: Medicaid Other | Admitting: Infectious Diseases

## 2022-09-10 ENCOUNTER — Ambulatory Visit (INDEPENDENT_AMBULATORY_CARE_PROVIDER_SITE_OTHER): Payer: Medicaid Other | Admitting: Pharmacist

## 2022-09-10 ENCOUNTER — Other Ambulatory Visit (HOSPITAL_COMMUNITY): Payer: Self-pay

## 2022-09-10 VITALS — BP 108/65 | HR 78 | Temp 98.4°F | Ht 69.0 in | Wt 150.0 lb

## 2022-09-10 DIAGNOSIS — Z21 Asymptomatic human immunodeficiency virus [HIV] infection status: Secondary | ICD-10-CM

## 2022-09-10 DIAGNOSIS — B20 Human immunodeficiency virus [HIV] disease: Secondary | ICD-10-CM | POA: Insufficient documentation

## 2022-09-10 DIAGNOSIS — B360 Pityriasis versicolor: Secondary | ICD-10-CM

## 2022-09-10 DIAGNOSIS — F603 Borderline personality disorder: Secondary | ICD-10-CM

## 2022-09-10 MED ORDER — BICTEGRAVIR-EMTRICITAB-TENOFOV 50-200-25 MG PO TABS
1.0000 | ORAL_TABLET | Freq: Every day | ORAL | 5 refills | Status: DC
  Filled 2022-09-10: qty 30, 30d supply, fill #0

## 2022-09-10 MED ORDER — KETOCONAZOLE 2 % EX CREA
1.0000 | TOPICAL_CREAM | Freq: Every day | CUTANEOUS | 3 refills | Status: AC
  Filled 2022-09-10: qty 60, 60d supply, fill #0
  Filled 2022-09-21: qty 30, 30d supply, fill #0

## 2022-09-10 NOTE — Patient Instructions (Addendum)
Nice to meet you!   Biktarvy is the pill I would like for you to start taking to treat you - this will need to be taken once a day around the same time.  - Common side effects for a short time frame usually include headaches, nausea and diarrhea - OK to take over the counter tylenol for headaches and imodium for diarrhea - Try taking with food if you are nauseated  - If you take any multivitamins or supplements please separate them from your Biktarvy by 6 hours before and after.  The main thing is do not have them in the stomach at the same time.   For primary care services, please call for a new patient visit  [facility] - [address] [place].  [phone]  Would like to see you back in 4 weeks to see how you are doing on your new medication.

## 2022-09-10 NOTE — Progress Notes (Unsigned)
09/10/2022  HPI: [name] is a 27 y.o. male who presents to the [facility] clinic today to initiate care for a newly diagnosed HIV infection.  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 Try to take at the same time each day with or without food.  Previous Medications   ACETAMINOPHEN (TYLENOL) 500 MG TABLET    Take 1 tablet (500 mg total) by mouth every 6 (six) hours as needed.   CITALOPRAM (CELEXA) 10 MG TABLET    Take 10 mg by mouth every morning.   HYDROXYZINE (ATARAX/VISTARIL) 25 MG TABLET    Take 1 tablet (25 mg total) by mouth 3 (three) times daily as needed for anxiety.   IBUPROFEN (ADVIL) 600 MG TABLET    Take 1 tablet (600 mg total) by mouth every 6 (six) hours as needed.   LIDOCAINE 4 %    Place 1 patch onto the skin 2 (two) times daily.  Modified Medications   Modified Medication Previous Medication   KETOCONAZOLE (NIZORAL) 2 % CREAM ketoconazole (NIZORAL) 2 % cream      Apply 1 Application topically daily.    Apply 1 application. topically daily.  Discontinued Medications   No medications on file    Allergies: Allergies  Allergen Reactions   Clozapine Hives, Other (See Comments) and Anaphylaxis    Increased blood pressure   Fluphenazine Other (See Comments) and Nausea And Vomiting    Hallucinations   Risperdal [Risperidone] Other (See Comments)    Unknown   Vistaril [Hydroxyzine] Other (See  Comments)    Drowsiness    Ziprasidone Other (See Comments)    Caused seizures   Fluphenazine Hcl Nausea And Vomiting   Paroxetine Hcl     Past Medical History: Past Medical History:  Diagnosis Date   ADD (attention deficit disorder)    ADHD (attention deficit hyperactivity disorder), 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   Anemia    Anxiety    Bipolar disorder (HCC)    Borderline personality disorder (HCC)    Central auditory processing disorder    Deliberate self-cutting    Depressed    Eczema    Headache(784.0)    Oppositional defiant disorder    Schizophrenia (HCC)     Social History: Social History   Socioeconomic History   Marital status: Single    Spouse name: Not on file   Number of children: Not on file   Years of education: Not on file   Highest education level: Not on file  Occupational History   Occupation: Unemployed  Tobacco Use   Smoking status: Every Day    Packs/day: 2    Types: Cigarettes   Smokeless tobacco: Never   Tobacco comments:    1-3 packs   Vaping Use   Vaping Use: Some days  Substance and Sexual Activity   Alcohol use: Yes    Alcohol/week: 2.0 standard drinks of alcohol    Types: 2 Shots of liquor per week   Drug use: Yes    Types:  Marijuana, Oxycodone    Comment: heroin   Sexual activity: Yes    Birth control/protection: None    Comment: pt reluctant to answer questions and frequently stated " I dont know"  Other Topics Concern   Not on file  Social History Narrative   ** Merged History Encounter **    Pt reported that he is unemployed, homeless as his mother is in rehab   Social Determinants of Health   Financial Resource Strain: Not on file  Food Insecurity: Not on file  Transportation Needs: Not on file  Physical Activity: Not on file  Stress: Not on file  Social Connections: Not on file    Labs: No results found for: "HIV1RNAQUANT", "HIV1RNAVL", "CD4TABS"  RPR and STI Lab Results  Component Value Date   LABRPR NON  REACTIVE 06/12/2022   LABRPR Non Reactive 05/27/2014    STI Results GC GC CT CT  Latest Ref Rng & Units  NEGATIVE  NEGATIVE  06/26/2020  7:48 AM Positive   Negative    02/25/2014  2:57 AM  POSITIVE   POSITIVE     Hepatitis B No results found for: "HEPBSAB", "HEPBSAG", "HEPBCAB" Hepatitis C No results found for: "HEPCAB", "HCVRNAPCRQN" Hepatitis A No results found for: "HAV" Lipids: Lab Results  Component Value Date   CHOL 197 09/26/2021   TRIG 43 09/26/2021   HDL 63 09/26/2021   CHOLHDL 3.1 09/26/2021   VLDL 9 09/26/2021   LDLCALC 125 (H) 09/26/2021    Current HIV Regimen: Treatment naive  Assessment: Mordechai is here today to initiate care with Judeth Cornfield for his newly diagnosed HIV infection.  He is treatment naive and does not yet have a viral load or CD4 count available. He was initiated on Biktarvy this visit.   During the visit, I introduced the pharmacy team and services-offered, including management of adverse effects, drug interactions, and assistance with medication access. We discussed how Susanne Borders is one pill taken once daily with or without food. I counseled Keanthony that Edison has few adverse effects, but that there may be a start-up syndrome associated with Biktarvy which may result in abdominal pain, nausea/vomiting, diarrhea and headache. I explained these effects are typically self-resolving after a couple of weeks taking the medication. He reports his only other chronic medication is citalopram and that he takes no supplements. However, I counseled on the potential drug interaction with polyvalent cations and to separate out Biktarvy from these cations. In the event of a missed dose, Khyren was advised to skip the missed dose if the time was closer to his next scheduled dose, and if not, he was advised to take the missed dose and proceed with the next dose as scheduled. At this point I took the opportunity to discuss the potential for drug-resistance to develop  if he takes his medication sporadically and how the staff at the [facility] clinic will assist him however we can to make sure he has access to the medications he needs.   All questions were answered to the patient's satisfaction during the visit. Corwyn voiced understanding to all counseling.   Plan: - START Biktarvy  - Call with questions or concerns   Jani Gravel, PharmD PGY-2 Infectious Diseases Resident  09/10/2022 5:17 PM

## 2022-09-10 NOTE — Progress Notes (Signed)
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 male with HIV diagnosed January 2024.  CD4 nadir pending VL  HIV Risk: sexual History of OIs: none Intake Labs 09/10/2022: Hep B sAg (), sAb (), cAb (); Hep A (), Hep C () Quantiferon () HLA B*5701 () G6PD: ()   Previous Regimens: Biktarvy 08/2022  Genotypes: Pending  Subjective:   Chief Complaint  Patient presents with   New Patient (Initial Visit)     HPI: [name] is here for new patient visit to discuss starting HIV treatment. Diagnosed initially in ER encounter June 12, 2022. He reports male and transfemale partners with only penetrative sex. He is not sure how long he has had HIV. Last negative HIV test was in 2016. Since diagnosis in January of this year, he has been seen in the ER for body aches/flu like symptoms. He is feeling "ok" today.   History of mental health condition in the past, currently maintained on citalopram only daily. He did have an IVC admission to [facility] for Schizoaffective d/o bipolar type in Nov2023 - he is not on any antipsychotics at the time of our visit. Has been in [facility] with [facility]. Has a history of disordered eating in the past,  not currently a problem.   Had rhabdomyolosis in the past - he has been drinking a lot of alcohol over the last few weeks.  He reports tinea versicolor occasionally - has a  small patch of rash that is starting up now and would like cream that has been successful in the past. He does not report any other illicit substance use.   Review of Systems  Constitutional:  Negative for chills, diaphoresis, fever, malaise/fatigue and weight loss.  Respiratory: Negative.    Cardiovascular: Negative.   Genitourinary: Negative.   Musculoskeletal: Negative.   Skin:  Positive for itching and rash.  Neurological:  Negative for dizziness and headaches.  Psychiatric/Behavioral:  Positive for depression. Negative for hallucinations, substance abuse and suicidal ideas. The patient does not have insomnia.     Past Medical History:  Diagnosis Date   ADD (attention deficit disorder)    ADHD (attention deficit hyperactivity disorder), 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   Anemia    Anxiety    Bipolar disorder (HCC)    Borderline personality disorder (HCC)    Central auditory processing disorder    Deliberate self-cutting    Depressed    Eczema    Headache(784.0)    Oppositional defiant disorder    Schizophrenia (HCC)     Outpatient Medications Prior to Visit  Medication Sig Dispense Refill   citalopram (CELEXA) 10 MG tablet Take 10 mg by mouth every morning.     acetaminophen (TYLENOL) 500 MG tablet Take 1 tablet (500 mg total) by mouth every 6 (six) hours as needed. (Patient not taking: Reported on 09/10/2022) 30 tablet 0   hydrOXYzine (ATARAX/VISTARIL) 25 MG tablet Take 1 tablet (25 mg total) by mouth 3 (three)  times daily as needed for anxiety. (Patient not taking: Reported on 12/18/2021) 30 tablet 0   ibuprofen (ADVIL) 600 MG tablet Take 1 tablet (600 mg total) by mouth every 6 (six) hours as needed. (Patient not taking: Reported on 09/10/2022) 20 tablet 0   lidocaine 4 % Place 1 patch onto the skin 2 (two) times daily. (Patient not taking: Reported on 09/10/2022) 12 patch 0   ketoconazole (NIZORAL) 2 % cream Apply 1 application. topically daily. (Patient not taking: Reported on  12/18/2021) 15 g 0   No facility-administered medications prior to visit.     Allergies  Allergen Reactions   Clozapine Hives, Other (See Comments) and Anaphylaxis    Increased blood pressure   Fluphenazine Other (See Comments) and Nausea And Vomiting    Hallucinations   Risperdal [Risperidone] Other (See Comments)    Unknown   Vistaril [Hydroxyzine] Other (See Comments)    Drowsiness    Ziprasidone Other (See Comments)    Caused seizures   Fluphenazine Hcl Nausea And Vomiting   Paroxetine Hcl     Social History   Tobacco Use   Smoking status: Every Day    Packs/day: 2    Types: Cigarettes   Smokeless tobacco: Never   Tobacco comments:    1-3 packs   Vaping Use   Vaping Use: Some days  Substance Use Topics   Alcohol use: Yes    Alcohol/week: 2.0 standard drinks of alcohol    Types: 2 Shots of liquor per week   Drug use: Yes    Types: Marijuana, Oxycodone    Comment: heroin    Family History  Problem Relation Age of Onset   Healthy Mother    Healthy Father     Social History   Substance and Sexual Activity  Sexual Activity Yes   Birth control/protection: None   Comment: pt reluctant to answer questions and frequently stated " I dont know"     Objective:   Vitals:   09/10/22 1420  BP: 108/65  Pulse: 78  Temp: 98.4 F (36.9 C)  TempSrc: Temporal  SpO2: 98%  Weight: 150 lb (68 kg)  Height: 5\' 9"  (1.753 m)   Body mass index is 22.15 kg/m.  Physical Exam Vitals reviewed.  Constitutional:      Appearance: Normal appearance. He is not ill-appearing.  HENT:     Head: Normocephalic.     Mouth/Throat:     Mouth: Mucous membranes are moist.     Pharynx: Oropharynx is clear.  Eyes:     General: No scleral icterus. Cardiovascular:     Rate and Rhythm: Normal rate and regular rhythm.  Pulmonary:     Effort: Pulmonary effort is normal.  Musculoskeletal:        General: Normal range of motion.     Cervical back: Normal range of motion.  Skin:     Coloration: Skin is not jaundiced or pale.  Neurological:     Mental Status: He is alert and oriented to person, place, and time.  Psychiatric:        Mood and Affect: Mood normal.        Judgment: Judgment normal.     Lab Results Lab Results  Component Value Date   WBC 3.2 (L) 06/11/2022   HGB 16.1 06/11/2022   HCT 50.0 06/11/2022   MCV 83.3 06/11/2022   PLT 236 06/11/2022    Lab Results  Component Value Date   CREATININE 0.85 06/11/2022   BUN 9 06/11/2022  NA 136 06/11/2022   K 3.7 06/11/2022   CL 100 06/11/2022   CO2 26 06/11/2022    Lab Results  Component Value Date   ALT 23 12/18/2021   AST 39 12/18/2021   ALKPHOS 70 12/18/2021   BILITOT 0.4 12/18/2021    Lab Results  Component Value Date   CHOL 197 09/26/2021   HDL 63 09/26/2021   LDLCALC 125 (H) 09/26/2021   TRIG 43 09/26/2021   CHOLHDL 3.1 09/26/2021   No results found for: "HIV1RNAQUANT", "HIV1RNAVL", "CD4TABS"   Assessment & Plan:   Problem List Items Addressed This Visit       Unprioritized   Borderline personality disorder (HCC)    Most recent [facility] admission indicates schizoaffective d/o, bipolar type. Also with borderline personality d/o mentioned in 2017. He is in care with [facility] and maintained on citalapram daily for depression management.       HIV (human immunodeficiency virus infection) (HCC) - Primary (Chronic)    New patient here to establish for HIV care. Treatment naive and ready to start medications today. Stage is unknown - last HIV test was non-reactive in 2016. Heterosexual/transfemale sexual partners with penetrative sex only.   I discussed with [name] treatment options/side effects, benefits of treatment and long-term outcomes. I discussed how HIV is transmitted and the process of untreated HIV including increased risk for opportunistic infections, cancer, dementia and renal failure. Patient was counseled on routine HIV care including medication  adherence, blood monitoring, necessary vaccines and follow up visits. Counseled regarding safe sex practices including: condom use, partner disclosure, limiting partners. Patient spent time talking with our pharmacist Cassie regarding successful practices of ART and understands to reach out to our clinic in the future with questions.   Will start Biktarvy for HIV treatment.  Patient does not have insurance - met with Presenter, broadcasting today to enroll in [place] services. Discussed interval for re-application today and services provided on [place] [facility]MAP.   General introduction to our clinic and integrated services. No needs identified.  Dental referral placed today for [facility]. Information to schedule appointment completed today.   Will return to clinic in 4 weeks to review lab work and continue HIV education. Pertinent labs ordered today.        Relevant Medications   ketoconazole (NIZORAL) 2 % cream   bictegravir-emtricitabine-tenofovir AF (BIKTARVY) 50-200-25 MG TABS tablet   Other Relevant Orders   Hepatitis B surface antibody,qualitative (Completed)   Hepatitis B surface antigen (Completed)   Hepatitis C Antibody (Completed)   Hepatitis A Ab, Total (Completed)   RPR (Completed)   QuantiFERON-TB Gold Plus (Completed)   Tinea versicolor    Ketoconazole cream rx sent in. OTC selsun blue not very helpful per his report.       Relevant Medications   ketoconazole (NIZORAL) 2 % cream   bictegravir-emtricitabine-tenofovir AF (BIKTARVY) 50-200-25 MG TABS tablet    Rexene Alberts, MSN, NP-C [facility] for Infectious Disease [facility] Pager: [phone] Office: [phone]  09/27/22  11:40 AM

## 2022-09-11 ENCOUNTER — Other Ambulatory Visit (HOSPITAL_COMMUNITY): Payer: Self-pay

## 2022-09-11 NOTE — Progress Notes (Signed)
NEW REFERRAL TO [facility] CLINIC  

## 2022-09-12 ENCOUNTER — Other Ambulatory Visit: Payer: Self-pay | Admitting: Pharmacist

## 2022-09-12 DIAGNOSIS — Z21 Asymptomatic human immunodeficiency virus [HIV] infection status: Secondary | ICD-10-CM

## 2022-09-12 LAB — HEPATITIS B SURFACE ANTIGEN: Hepatitis B Surface Ag: NONREACTIVE

## 2022-09-12 LAB — HEPATITIS B SURFACE ANTIBODY,QUALITATIVE: Hep B S Ab: NONREACTIVE

## 2022-09-12 LAB — QUANTIFERON-TB GOLD PLUS
Mitogen-NIL: 3.98 IU/mL
NIL: 0.04 IU/mL
QuantiFERON-TB Gold Plus: NEGATIVE
TB1-NIL: 0 IU/mL
TB2-NIL: <0 IU/mL

## 2022-09-12 LAB — HEPATITIS C ANTIBODY: Hepatitis C Ab: NONREACTIVE

## 2022-09-12 LAB — HEPATITIS A ANTIBODY, TOTAL: Hepatitis A AB,Total: REACTIVE — AB

## 2022-09-12 LAB — RPR: RPR Ser Ql: NONREACTIVE

## 2022-09-12 MED ORDER — BIKTARVY 50-200-25 MG PO TABS: 1.0000 | ORAL_TABLET | Freq: Every day | ORAL | 0 refills | Status: AC

## 2022-09-12 NOTE — Progress Notes (Signed)
Medication Samples have been provided to the patient.  Drug name: Biktarvy        Strength: 50/200/25 mg       Qty: 14 tablets (2 bottles)   LOT: [id number]   Exp.Date: 12/2024  Dosing instructions: Take one tablet by mouth once daily  The patient has been instructed regarding the correct time, dose, and frequency of taking this medication, including desired effects and most common side effects.   Lissie Hinesley L. Carnisha Feltz, PharmD, BCIDP, AAHIVP, CPP Clinical Pharmacist Practitioner Infectious Diseases Clinical Pharmacist [facility] 04/24/2020, 10:07 AM  

## 2022-09-21 ENCOUNTER — Other Ambulatory Visit (HOSPITAL_COMMUNITY): Payer: Self-pay

## 2022-09-27 DIAGNOSIS — B360 Pityriasis versicolor: Secondary | ICD-10-CM | POA: Insufficient documentation

## 2022-09-27 NOTE — Assessment & Plan Note (Signed)
New patient here to establish for HIV care. Treatment naive and ready to start medications today. Stage is unknown - last HIV test was non-reactive in 2016. Heterosexual/transfemale sexual partners with penetrative sex only.   I discussed with [name] treatment options/side effects, benefits of treatment and long-term outcomes. I discussed how HIV is transmitted and the process of untreated HIV including increased risk for opportunistic infections, cancer, dementia and renal failure. Patient was counseled on routine HIV care including medication adherence, blood monitoring, necessary vaccines and follow up visits. Counseled regarding safe sex practices including: condom use, partner disclosure, limiting partners. Patient spent time talking with our pharmacist Cassie regarding successful practices of ART and understands to reach out to our clinic in the future with questions.   Will start Biktarvy for HIV treatment.  Patient does not have insurance - met with Presenter, broadcasting today to enroll in [place] services. Discussed interval for re-application today and services provided on [place] HMAP.   General introduction to our clinic and integrated services. No needs identified.  Dental referral placed today for [facility]. Information to schedule appointment completed today.   Will return to clinic in 4 weeks to review lab work and continue HIV education. Pertinent labs ordered today.

## 2022-09-27 NOTE — Assessment & Plan Note (Signed)
Ketoconazole cream rx sent in. OTC selsun blue not very helpful per his report.

## 2022-09-27 NOTE — Assessment & Plan Note (Signed)
Most recent [facility] admission indicates schizoaffective d/o, bipolar type. Also with borderline personality d/o mentioned in 2017. He is in care with [facility] and maintained on citalapram daily for depression management.

## 2022-10-03 ENCOUNTER — Other Ambulatory Visit (HOSPITAL_COMMUNITY): Payer: Self-pay

## 2022-10-06 ENCOUNTER — Other Ambulatory Visit: Payer: Self-pay

## 2022-10-06 ENCOUNTER — Emergency Department (HOSPITAL_COMMUNITY)
Admission: EM | Admit: 2022-10-06 | Discharge: 2022-10-06 | Discharge status: Against Medical Advice | Payer: Medicaid Other | Attending: Emergency Medicine | Admitting: Emergency Medicine

## 2022-10-06 ENCOUNTER — Encounter (HOSPITAL_COMMUNITY): Payer: Self-pay

## 2022-10-06 ENCOUNTER — Emergency Department (HOSPITAL_COMMUNITY): Payer: Medicaid Other

## 2022-10-06 DIAGNOSIS — R109 Unspecified abdominal pain: Secondary | ICD-10-CM | POA: Insufficient documentation

## 2022-10-06 DIAGNOSIS — Z21 Asymptomatic human immunodeficiency virus [HIV] infection status: Secondary | ICD-10-CM | POA: Insufficient documentation

## 2022-10-06 DIAGNOSIS — F1090 Alcohol use, unspecified, uncomplicated: Secondary | ICD-10-CM | POA: Diagnosis not present

## 2022-10-06 DIAGNOSIS — Z5321 Procedure and treatment not carried out due to patient leaving prior to being seen by health care provider: Secondary | ICD-10-CM | POA: Diagnosis not present

## 2022-10-06 LAB — COMPREHENSIVE METABOLIC PANEL
ALT: 18 U/L (ref 0–44)
AST: 26 U/L (ref 15–41)
Albumin: 3.7 g/dL (ref 3.5–5.0)
Alkaline Phosphatase: 87 U/L (ref 38–126)
Anion gap: 10 (ref 5–15)
BUN: 8 mg/dL (ref 6–20)
CO2: 27 mmol/L (ref 22–32)
Calcium: 9.1 mg/dL (ref 8.9–10.3)
Chloride: 98 mmol/L (ref 98–111)
Creatinine, Ser: 0.88 mg/dL (ref 0.61–1.24)
GFR, Estimated: >60 mL/min (ref >60)
Glucose, Bld: 90 mg/dL (ref 70–99)
Potassium: 4.2 mmol/L (ref 3.5–5.1)
Sodium: 135 mmol/L (ref 135–145)
Total Bilirubin: 0.5 mg/dL (ref 0.3–1.2)
Total Protein: 7.5 g/dL (ref 6.5–8.1)

## 2022-10-06 LAB — URINALYSIS, ROUTINE W REFLEX MICROSCOPIC
Bilirubin Urine: NEGATIVE
Glucose, UA: NEGATIVE mg/dL
Hgb urine dipstick: NEGATIVE
Ketones, ur: NEGATIVE mg/dL
Leukocytes,Ua: NEGATIVE
Nitrite: NEGATIVE
Protein, ur: NEGATIVE mg/dL
Specific Gravity, Urine: 1.018 (ref 1.005–1.030)
pH: 7 (ref 5.0–8.0)

## 2022-10-06 LAB — CBC WITH DIFFERENTIAL/PLATELET
Abs Immature Granulocytes: 0.01 10*3/uL (ref 0.00–0.07)
Basophils Absolute: 0 10*3/uL (ref 0.0–0.1)
Basophils Relative: 1 %
Eosinophils Absolute: 0 10*3/uL (ref 0.0–0.5)
Eosinophils Relative: 1 %
HCT: 45.6 % (ref 39.0–52.0)
Hemoglobin: 14.5 g/dL (ref 13.0–17.0)
Immature Granulocytes: 0 %
Lymphocytes Relative: 34 %
Lymphs Abs: 1.5 10*3/uL (ref 0.7–4.0)
MCH: 27.1 pg (ref 26.0–34.0)
MCHC: 31.8 g/dL (ref 30.0–36.0)
MCV: 85.1 fL (ref 80.0–100.0)
Monocytes Absolute: 0.3 10*3/uL (ref 0.1–1.0)
Monocytes Relative: 7 %
Neutro Abs: 2.5 10*3/uL (ref 1.7–7.7)
Neutrophils Relative %: 57 %
Platelets: 287 10*3/uL (ref 150–400)
RBC: 5.36 MIL/uL (ref 4.22–5.81)
RDW: 13.7 % (ref 11.5–15.5)
WBC: 4.3 10*3/uL (ref 4.0–10.5)
nRBC: 0 % (ref 0.0–0.2)

## 2022-10-06 MED ORDER — KETOROLAC TROMETHAMINE 15 MG/ML IJ SOLN
15.0000 mg | Freq: Once | INTRAMUSCULAR | Status: AC
  Administered 2022-10-06: 15 mg via INTRAVENOUS
  Filled 2022-10-06: qty 1

## 2022-10-06 NOTE — ED Provider Notes (Signed)
[name] [name] is a 27 y.o. male.  With PMH of asymptomatic HIV recently started on Biktarvy, borderline personality disorder, substance use disorder who presents with bilateral flank pain.   Patient eloped after CT renal scan was completed.  The read on CT was still pending as well as his blood work.  His UA was reviewed by me with no evidence of UTI no RBCs or hemoglobin suggestive of nephrolithiasis.  I never personally saw this patient or examine this patient.  As I discussed, he will open after CT scan and formal physician history/exam.   Mardene Sayer, MD 10/06/22 1119

## 2022-10-06 NOTE — ED Notes (Signed)
This RN asked pt if he had another number for his mother since the one in the chart is not working and patient gave this RN the same number that was in the chart; denies having another number for her.

## 2022-10-06 NOTE — ED Triage Notes (Signed)
Pt c/o bilateral flank pain that is stronger in the right side. Pt is concerned for his kidneys. Pt states he has consumed a significant amount of EOTH the last few days, last drink at midnight.

## 2022-10-06 NOTE — ED Notes (Signed)
Pt's legal guardian number states it cannot connect.

## 2022-10-06 NOTE — ED Notes (Signed)
Patient approached charge desk and stated he needed to go to work and wanted to leave. IV removed. MD notified

## 2022-10-10 ENCOUNTER — Encounter: Payer: Self-pay | Admitting: Infectious Diseases

## 2022-10-10 ENCOUNTER — Ambulatory Visit (INDEPENDENT_AMBULATORY_CARE_PROVIDER_SITE_OTHER): Payer: Medicaid Other | Admitting: Infectious Diseases

## 2022-10-10 ENCOUNTER — Other Ambulatory Visit: Payer: Self-pay

## 2022-10-10 VITALS — BP 106/67 | HR 71 | Temp 98.6°F | Ht 70.0 in | Wt 150.0 lb

## 2022-10-10 DIAGNOSIS — Z23 Encounter for immunization: Secondary | ICD-10-CM

## 2022-10-10 DIAGNOSIS — Z21 Asymptomatic human immunodeficiency virus [HIV] infection status: Secondary | ICD-10-CM

## 2022-10-10 DIAGNOSIS — Z0000 Encounter for general adult medical examination without abnormal findings: Secondary | ICD-10-CM

## 2022-10-10 MED ORDER — BICTEGRAVIR-EMTRICITAB-TENOFOV 50-200-25 MG PO TABS
1.0000 | ORAL_TABLET | Freq: Every day | ORAL | 5 refills | Status: AC
Start: 2022-10-10 — End: ?

## 2022-10-10 NOTE — Progress Notes (Signed)
Name: [name]  DOB: July 30, 1995 MRN: [id number] PCP: Pcp, No    Brief Narrative:  [name] is a 27 y.o. male with HIV diagnosed January 2024.  CD4 nadir pending VL  HIV Risk: sexual History of OIs: none Intake Labs 09/10/2022: Hep B sAg (non-reactive), sAb (non-reactive), cAb (); Hep A (immune), Hep C (non-reactive) Quantiferon (non-reactive)   Previous Regimens: Biktarvy 08/2022  Genotypes: Pending  Subjective:   Chief Complaint  Patient presents with   Follow-up     HPI: [name] is here for follow up. He has no concerns today he would like to talk about. He finished a month supply of biktarvy but has not refilled yet. He has not noticed any significant side effects. [facility] pharmacy team has attempted to reach him however his phone number is currently not operational. Plans to get this back in order soon, for now only wireless connectivity. He would like to use a pharmacy easier for him to get to locally.    Review of Systems  All other systems reviewed and are negative.   Past Medical History:  Diagnosis Date   ADD (attention deficit disorder)    ADHD (attention deficit hyperactivity disorder), 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   Anemia    Anxiety    Bipolar disorder (HCC)    Borderline personality disorder (HCC)    Central auditory processing disorder    Deliberate self-cutting    Depressed    Eczema    Headache(784.0)    Oppositional defiant disorder    Schizophrenia (HCC)     Outpatient Medications Prior to Visit  Medication Sig Dispense Refill   citalopram (CELEXA) 10 MG tablet Take 10 mg by mouth every morning.     gabapentin (NEURONTIN) 300 MG capsule Take 300 mg by mouth 2 (two) times daily.     bictegravir-emtricitabine-tenofovir AF (BIKTARVY) 50-200-25 MG TABS tablet Take 1 tablet by mouth daily. Try to take at the same time each day with or without food. 30 tablet 5   acetaminophen (TYLENOL) 500 MG  tablet Take 1 tablet (500 mg total) by mouth every 6 (six) hours as needed. (Patient not taking: Reported on 09/10/2022) 30 tablet 0   hydrOXYzine (ATARAX/VISTARIL) 25 MG tablet Take 1 tablet (25 mg total) by mouth 3 (three) times daily as needed for anxiety. (Patient not taking: Reported on 12/18/2021) 30 tablet 0   ibuprofen (ADVIL) 600 MG tablet Take 1 tablet (600 mg total) by mouth every 6 (six) hours as needed. (Patient not taking: Reported on 09/10/2022) 20 tablet 0   ketoconazole (NIZORAL) 2 % cream Apply 1 Application topically daily. (Patient not taking: Reported on 10/10/2022) 60 g 3   lidocaine 4 % Place 1 patch onto the skin 2 (two) times daily. (Patient not taking: Reported on 09/10/2022) 12 patch 0   No facility-administered medications prior to visit.     Allergies  Allergen Reactions   Clozapine Hives, Other (See Comments) and Anaphylaxis    Increased blood pressure   Fluphenazine Other (See Comments) and Nausea And Vomiting    Hallucinations   Risperdal [Risperidone] Other (See Comments)    Unknown   Vistaril [Hydroxyzine] Other (See Comments)    Drowsiness    Ziprasidone Other (See Comments)    Caused seizures   Fluphenazine Hcl Nausea And Vomiting   Paroxetine Hcl     Social History   Tobacco Use   Smoking status: Every Day    Packs/day: 2    Types: Cigarettes  Smokeless tobacco: Never   Tobacco comments:    1-3 packs   Vaping Use   Vaping Use: Some days  Substance Use Topics   Alcohol use: Yes    Alcohol/week: 2.0 standard drinks of alcohol    Types: 2 Shots of liquor per week   Drug use: Yes    Types: Marijuana, Oxycodone    Comment: heroin     Social History   Substance and Sexual Activity  Sexual Activity Yes   Birth control/protection: None   Comment: pt reluctant to answer questions and frequently stated " I dont know"     Objective:   Vitals:   10/10/22 1526  BP: 106/67  Pulse: 71  Temp: 98.6 F (37 C)  TempSrc: Temporal  SpO2: 100%   Weight: 150 lb (68 kg)  Height: 5\' 10"  (1.778 m)   Body mass index is 21.52 kg/m.  Physical Exam Vitals reviewed.  Constitutional:      Appearance: Normal appearance. He is not ill-appearing.  HENT:     Head: Normocephalic.     Mouth/Throat:     Mouth: Mucous membranes are moist.     Pharynx: Oropharynx is clear.  Eyes:     General: No scleral icterus. Cardiovascular:     Rate and Rhythm: Normal rate and regular rhythm.  Pulmonary:     Effort: Pulmonary effort is normal.  Musculoskeletal:        General: Normal range of motion.     Cervical back: Normal range of motion.  Skin:    Coloration: Skin is not jaundiced or pale.  Neurological:     Mental Status: He is alert and oriented to person, place, and time.  Psychiatric:     Comments: Quite, makes eye contact but seems a bit withdrawn today     Lab Results Lab Results  Component Value Date   WBC 4.3 10/06/2022   HGB 14.5 10/06/2022   HCT 45.6 10/06/2022   MCV 85.1 10/06/2022   PLT 287 10/06/2022    Lab Results  Component Value Date   CREATININE 0.88 10/06/2022   BUN 8 10/06/2022   NA 135 10/06/2022   K 4.2 10/06/2022   CL 98 10/06/2022   CO2 27 10/06/2022    Lab Results  Component Value Date   ALT 18 10/06/2022   AST 26 10/06/2022   ALKPHOS 87 10/06/2022   BILITOT 0.5 10/06/2022    Lab Results  Component Value Date   CHOL 197 09/26/2021   HDL 63 09/26/2021   LDLCALC 125 (H) 09/26/2021   TRIG 43 09/26/2021   CHOLHDL 3.1 09/26/2021   No results found for: "HIV1RNAQUANT", "HIV1RNAVL", "CD4TABS"   Assessment & Plan:   Problem List Items Addressed This Visit       Unprioritized   HIV (human immunodeficiency virus infection) (HCC) - Primary (Chronic)    Doing well on once daily biktarvy. We missed drawing VL/CD4 last visit - will draw these for him today.  Will send refills to preferred pharmacy - I asked him to set a reminder on his calendar monthly for a reminder so he can call the pharmacy  to refill his medication. His phone number was reviewed and will be turned back on soon.  No changes to insurance coverage.  No dental needs today Mood seems withdrawn today - will discuss again at return to ensure he has what he needs resource wise.  Vaccines updated today - see health maintenance section.    Return in about 3  months (around 01/10/2023).        Relevant Medications   bictegravir-emtricitabine-tenofovir AF (BIKTARVY) 50-200-25 MG TABS tablet   Other Relevant Orders   HIV 1 RNA quant-no reflex-bld   T-helper cells (CD4) count   Heplisav-B (HepB-CPG) Vaccine (Completed)   Other Visit Diagnoses     Need for hepatitis B vaccination       Relevant Orders   Heplisav-B (HepB-CPG) Vaccine (Completed)      Rexene Alberts, MSN, NP-C [facility] Pager: [phone] Office: [phone]  10/11/22  2:07 PM

## 2022-10-10 NOTE — Patient Instructions (Signed)
Nice to see you today. Please continue your biktarvy everyday.   Until you get you phone number up and working for refill reminders, it may be helpful to put a monthly calendar reminder to help remind you to refill your medication until you get in the habit of taking it.   Will get you your hepatitis B booster vaccine today and check at your return visit to see if that was enough to restore your immunity (protection).   Will plan to see you back in 3 months.

## 2022-10-11 DIAGNOSIS — Z0000 Encounter for general adult medical examination without abnormal findings: Secondary | ICD-10-CM | POA: Insufficient documentation

## 2022-10-11 NOTE — Assessment & Plan Note (Signed)
Hep B booster today - repeat surface Ab next visit in 2m.

## 2022-10-11 NOTE — Assessment & Plan Note (Signed)
Doing well on once daily biktarvy. We missed drawing VL/CD4 last visit - will draw these for him today.  Will send refills to preferred pharmacy - I asked him to set a reminder on his calendar monthly for a reminder so he can call the pharmacy to refill his medication. His phone number was reviewed and will be turned back on soon.  No changes to insurance coverage.  No dental needs today Mood seems withdrawn today - will discuss again at return to ensure he has what he needs resource wise.  Vaccines updated today - see health maintenance section.    Return in about 3 months (around 01/10/2023).

## 2022-10-12 LAB — HIV-1 RNA QUANT-NO REFLEX-BLD
HIV 1 RNA Quant: 2740 Copies/mL — ABNORMAL HIGH
HIV-1 RNA Quant, Log: 3.44 Log cps/mL — ABNORMAL HIGH

## 2022-10-14 LAB — T-HELPER CELLS (CD4) COUNT (NOT AT ARMC)
CD4 % Helper T Cell: 38 % (ref 33–65)
CD4 T Cell Abs: 492 /uL (ref 400–1790)

## 2022-10-15 ENCOUNTER — Telehealth: Payer: Self-pay

## 2022-10-15 NOTE — Telephone Encounter (Signed)
Received notification from the front desk that patient is unable to get his Biktarvy from [place]. Called [facility], Susanne Borders is ready to be picked up with $0 copay. Called Zayion back to let him know, no answer. Left HIPAA compliant voicemail stating MyChart message would be sent.   Sandie Ano, RN

## 2022-10-24 ENCOUNTER — Ambulatory Visit (HOSPITAL_COMMUNITY)
Admission: EM | Admit: 2022-10-24 | Discharge: 2022-10-24 | Disposition: A | Discharge status: Home / Self Care | Payer: Medicaid Other

## 2022-10-25 ENCOUNTER — Ambulatory Visit (HOSPITAL_COMMUNITY)
Admission: EM | Admit: 2022-10-25 | Discharge: 2022-10-25 | Disposition: A | Discharge status: Home / Self Care | Payer: Medicaid Other | Attending: Psychiatry | Admitting: Psychiatry

## 2022-10-25 DIAGNOSIS — F259 Schizoaffective disorder, unspecified: Secondary | ICD-10-CM | POA: Insufficient documentation

## 2022-10-25 DIAGNOSIS — F1910 Other psychoactive substance abuse, uncomplicated: Secondary | ICD-10-CM | POA: Insufficient documentation

## 2022-10-25 DIAGNOSIS — R456 Violent behavior: Secondary | ICD-10-CM | POA: Insufficient documentation

## 2022-10-25 DIAGNOSIS — R4689 Other symptoms and signs involving appearance and behavior: Secondary | ICD-10-CM

## 2022-10-25 NOTE — Progress Notes (Signed)
   10/25/22 0212  [facility] Triage Screening (Walk-ins at [facility] only)  What Is the Reason for Your Visit/Call Today? [name] is a 27 year old single male who presents to [facility] requesting detox from Ecstasy, which he states he has been using for the last week. Unable to assess further, due to patient becoming agitated and using vulgar language. Patient ate food provided and informed staff he wanted to leave.  How Long Has This Been Causing You Problems? 1 wk - 1 month  Have You Recently Had Any Thoughts About Hurting Yourself?  (Unable to assess, due to patient becoming agitated and using vulgar language.)  Are You Planning to Commit Suicide/Harm Yourself At This time?  (Unable to assess, due to patient becoming agitated and using vulgar language.)  Have you Recently Had Thoughts About Hurting Someone Else?  (Unable to assess, due to patient becoming agitated and using vulgar language.)  Are You Planning To Harm Someone At This Time?  (Unable to assess, due to patient becoming agitated and using vulgar language.)  Are you currently experiencing any auditory, visual or other hallucinations?  (Unable to assess, due to patient becoming agitated and using vulgar language.)  Have You Used Any Alcohol or Drugs in the Past 24 Hours? Yes  How long ago did you use Drugs or Alcohol? Today  What Did You Use and How Much? Ecstasy  Do you have any current medical co-morbidities that require immediate attention?  (Unable to assess, due to patient becoming agitated and using vulgar language.)  Clinician description of patient physical appearance/behavior: Patient is dressed in pants and a t-shirt. Patient make good eye contact and has normal speech. Patient appears agitated and uses vulgar language when trying to be assessed. There is no indication patient is responding to internal stimuli.  What Do You Feel Would Help You the Most Today? Alcohol or Drug Use Treatment  If access to [facility] was  not available, would you have sought care in the Emergency Department?  (Unable to assess, due to patient becoming agitated and using vulgar language.)  Determination of Need Routine (7 days)  Options For Referral  (Unable to assess, due to patient becoming agitated and using vulgar language.)

## 2022-10-25 NOTE — ED Provider Notes (Signed)
[facility] Medical Screening Exam  Patient Name: [name] MRN: [id number] Date of Evaluation: 10/25/22 Chief Complaint:   Diagnosis:  Final diagnoses:  Polysubstance abuse (HCC)  Aggression aggravated  Violent behavior    History of Present illness: [name] is a 27 y.o. male. With a history of schizoaffective disorder, suicide ideation, presented to [facility] voluntarily.  This is a second time patient came to the facility within the past 4 hours first time patient came in he was very verbally abusive and rude then he left.  Patient returned.  Upon entering the room patient is observed sitting in the interview room eating when writer tried to interview patient patient begins to curse and be verbally abusive.  Writer asked patient to refrain from using curse words, however patient becomes very argumentative.  Writer discussed with patient that he is allowing time to eat and will come back and finish the interview during the waiting.  Patient was over heard banging on the door security was called patient stated he wanted he is ready to go.  Prior to leaving patient got using obscene language and called the writer out of his name.  Patient to follow-up with outpatient resources    Flowsheet Row ED from 10/06/2022 in [facility] Emergency Department at [facility] ED from 08/19/2022 in [facility] Emergency Department at [facility] ED from 08/06/2022 in [facility] Emergency Department at [facility]  C-SSRS RISK CATEGORY No Risk Error: Q3, 4, or 5 should not be populated when Q2 is No Error: Q3, 4, or 5 should not be populated when Q2 is No       Psychiatric Specialty Exam  Presentation  General Appearance:Bizarre  Eye Contact:Fair  Speech:Blocked  Speech Volume:Increased  Handedness:Right   Mood and Affect  Mood: Angry  Affect: Blunt   Thought Process  Thought  Processes: Coherent  Descriptions of Associations:Circumstantial  Orientation:Full (Time, Place and Person)  Thought Content:Scattered  Diagnosis of Schizophrenia or Schizoaffective disorder in past: Yes   Hallucinations:None  Ideas of Reference:None  Suicidal Thoughts:No With Plan; With Intent  Homicidal Thoughts:No   Sensorium  Memory: Immediate Fair  Judgment: Poor  Insight: Poor   Executive Functions  Concentration: Poor  Attention Span: Fair  Recall: Fair  Fund of Knowledge: Fair  Language: Fair   Psychomotor Activity  Psychomotor Activity: Normal   Assets  Assets: Desire for Improvement; Housing; Resilience; Social Support   Sleep  Sleep: Poor  Number of hours:  5   Physical Exam: Physical Exam HENT:     Head: Normocephalic.  Musculoskeletal:        General: Normal range of motion.     Cervical back: Normal range of motion.  Neurological:     Mental Status: He is alert.  Psychiatric:        Mood and Affect: Mood normal.        Behavior: Behavior normal.        Thought Content: Thought content normal.        Judgment: Judgment normal.    Review of Systems  Constitutional: Negative.   HENT: Negative.    Eyes: Negative.   Respiratory: Negative.    Cardiovascular: Negative.   Genitourinary: Negative.   Musculoskeletal: Negative.   Skin: Negative.   Neurological: Negative.   Psychiatric/Behavioral:  Positive for substance abuse. The patient is nervous/anxious.    Blood pressure 120/71, pulse 66, temperature 98.1 F (36.7 C), temperature source Oral,  resp. rate 18, SpO2 98 %. There is no height or weight on file to calculate BMI.  Musculoskeletal: Strength & Muscle Tone: within normal limits Gait & Station: normal Patient leans: N/A   [facility] MSE Discharge Disposition for Follow up and Recommendations: Based on my evaluation the patient does not appear to have an emergency medical condition and can be discharged with  resources and follow up care in outpatient services for Individual Therapy   Sindy Guadeloupe, NP 10/25/2022, 2:18 AM

## 2022-10-25 NOTE — Addendum Note (Signed)
Addended by: Blanchard Kelch on: 10/25/2022 04:36 PM   Modules accepted: Orders

## 2022-10-25 NOTE — Progress Notes (Signed)
Can we try to reach [facility] to go over results with [name] on the phone?   His viral load is 2740 I want to make sure he has what he needs to get his biktarvy from the new pharmacy and has ben able to fill since I last saw [name]. Just remind [name] to keep filling and if he ever cannot get or afford his medicine reach out to [place].   His cd4 count is in a good healthy range - 492. This means that his immune system is capable of protecting [name] without any extra help or precautions. The only thing is to keep taking his biktarvy to keep his viral load as low as possible so he can stay in good shape.   Would like to get [name] back in 39m to repeat VL and hep b surface antibody (orders entered) and back to see me in 3 m for routine care

## 2022-10-28 ENCOUNTER — Telehealth: Payer: Self-pay

## 2022-10-28 NOTE — Telephone Encounter (Signed)
Patient called back to review results. Is not able to schedule lab or follow up appointment. Would like to call back to schedule appointment. Patient denies having issues getting medication. Understands he can call office if he does in the future. Is trying to take medication without missing a dose.  Juanita Laster, RMA

## 2022-10-28 NOTE — Telephone Encounter (Signed)
-----   Message from Blanchard Kelch, NP sent at 10/25/2022  4:35 PM EDT ----- Can we try to reach [facility] to go over results with [name] on the phone?   His viral load is 2740 I want to make sure he has what he needs to get his biktarvy from the new pharmacy and has ben able to fill since I last saw [name]. Just remind [name] to keep filling and if he ever cannot get or afford his medicine reach out to [place].   His cd4 count is in a good healthy range - 492. This means that his immune system is capable of protecting [name] without any extra help or precautions. The only thing is to keep taking his biktarvy to keep his viral load as low as possible so he can stay in good shape.   Would like to get [name] back in 70m to repeat VL and hep b surface antibody (orders entered) and back to see me in 3 m for routine care

## 2022-10-28 NOTE — Telephone Encounter (Signed)
Left voicemail asking patient to return my call.   Angelmarie Ponzo P Santiago Stenzel, CMA  

## 2022-12-03 ENCOUNTER — Telehealth: Payer: Self-pay

## 2022-12-03 NOTE — Telephone Encounter (Signed)
[name] called, he has moved to [place] and will be transferring his care there. He has a second appointment with a new clinic today. Asked him to fill out a records release form while there and emailed him our clinic's contact info and fax number.   [facility] notified.   Sandie Ano, RN

## 2023-01-06 ENCOUNTER — Other Ambulatory Visit: Payer: Self-pay
